# Patient Record
Sex: Female | Born: 1935
Health system: Southern US, Community
[De-identification: ages and names within clinical notes are randomized; demographics above are authoritative.]

## PROBLEM LIST (undated history)

## (undated) DIAGNOSIS — E079 Disorder of thyroid, unspecified: Secondary | ICD-10-CM

## (undated) DIAGNOSIS — Z8619 Personal history of other infectious and parasitic diseases: Secondary | ICD-10-CM

## (undated) DIAGNOSIS — M1990 Unspecified osteoarthritis, unspecified site: Secondary | ICD-10-CM

## (undated) DIAGNOSIS — I10 Essential (primary) hypertension: Secondary | ICD-10-CM

## (undated) DIAGNOSIS — E785 Hyperlipidemia, unspecified: Secondary | ICD-10-CM

## (undated) DIAGNOSIS — E119 Type 2 diabetes mellitus without complications: Secondary | ICD-10-CM

## (undated) HISTORY — PX: KNEE ARTHROSCOPY: SHX127

## (undated) HISTORY — DX: Hyperlipidemia, unspecified: E78.5

## (undated) HISTORY — DX: Unspecified osteoarthritis, unspecified site: M19.90

## (undated) HISTORY — DX: Essential (primary) hypertension: I10

## (undated) HISTORY — DX: Type 2 diabetes mellitus without complications: E11.9

## (undated) HISTORY — PX: REPLACEMENT TOTAL KNEE BILATERAL: SUR1225

## (undated) HISTORY — DX: Personal history of other infectious and parasitic diseases: Z86.19

## (undated) HISTORY — PX: ABDOMINAL HYSTERECTOMY: SHX81

## (undated) HISTORY — DX: Disorder of thyroid, unspecified: E07.9

---

## 2000-03-16 ENCOUNTER — Encounter: Payer: Self-pay | Admitting: Orthopedic Surgery

## 2000-03-20 ENCOUNTER — Inpatient Hospital Stay (HOSPITAL_COMMUNITY): Admission: RE | Admit: 2000-03-20 | Discharge: 2000-03-23 | Payer: Self-pay | Admitting: Orthopedic Surgery

## 2008-09-22 ENCOUNTER — Encounter: Admission: RE | Admit: 2008-09-22 | Discharge: 2008-11-04 | Payer: Self-pay | Admitting: Orthopedic Surgery

## 2010-01-03 LAB — HM MAMMOGRAPHY

## 2010-05-21 NOTE — Discharge Summary (Signed)
[facility]  Patient:    Jessica Meyer, Jessica Meyer [facility]                      MRN: [id number] Adm. Date:  [id number] Disc. Date: [id number] Attending:  Carolan Shiver Ii Dictator:   Arnoldo Morale, P.A.                           Discharge Summary  ADMISSION DIAGNOSES: 1. End-stage osteoarthritis, left knee. 2. History of osteoarthritis, right knee with right total knee replacement. 3. Type 2 diabetes mellitus. 4. Hypertension. 5. History of migraines.  DISCHARGE DIAGNOSES: 1. End-stage osteoarthritis, left knee. 2. History of osteoarthritis, right knee with right total knee replacement. 3. Type 2 diabetes mellitus. 4. Hypertension. 5. History of migraines. 6. Posthemorrhagic anemia.  PROCEDURE:  On March 20, 2000, Jessica Meyer underwent a left total knee arthroplasty by Dr. Jonny Ruiz L. Rendall, assisted by Arlyn Leak, P.A.-C.  No complications.  CONSULTATIONS: 1. Pharmacy consult for Coumadin therapy on March 20, 2000. 2. Physical therapy and rehabilitation medicine consult on March 21, 2000. 3. Occupational therapy consult on March 22, 2000.  HISTORY OF PRESENT ILLNESS:  This 75 year old, white female presented to Dr. Priscille Kluver with a three-year history of progressively worsening left knee pain. She underwent arthroscopy in September 2001, with minimal relief and the pain has gotten worse.  It is constant sharp and stabbing located over the anterior aspect of the knee without radiation.  It increases with walking and decreases with elevation.  The knee pops and locks and keeps her awake at night at times.  She has failed conservative measures and because of this, she is presenting for a left total knee arthroplasty.  HOSPITAL COURSE:  Jessica Meyer tolerated her surgical procedure well without immediate postoperative complications.  She was transferred to [address].  On postop day #1, she was afebrile with vital signs stable.  Hemoglobin was 11.5, hematocrit 33.7.  The  pain was well-controlled with PCA and she started on PT per protocol.  She had a normal postoperative course over the next several days.  She tolerated therapy well and was switched to p.o. pain medication.  T-max on March 20, was 99.4 and on March 21, was 101.4.  She did have some calf pain at that time, so a Doppler was obtained which was negative for DVT.  She was believed stable for discharge at that time and was discharged home later in the day.  SPECIAL INSTRUCTIONS:  She is to resume all prehospitalization medications and diet except for the Darvocet.  She is arranged for home health physical therapy, R.N. and pharmacy per [facility].  Home health nurse is to discontinue her staples and apply Steri-Strips with benzoin on April 03, 2000.  She is to monitor her temperature and notify Dr. Priscille Kluver if temperature greater than 101.5, chills, pain unrelieved by pain medications or foul smelling drainage from the wound.  DISCHARGE MEDICATIONS: 1. OxyContin 10 mg p.o. q.12h., #20 with no refill. 2. Percocet 5 mg one to two p.o. q.4h. p.r.n. pain, #40 with no refill. 3. Coumadin one tablet p.o. q.d. with the dose to be determined by pharmacy.  ACTIVITY:  She is to weightbearing as tolerated on the left leg with the use of a walker and is to keep the incision clean and dry.  FOLLOWUP:  Follow up with Dr. Priscille Kluver in our [facility] office on April 05, 2000, and  is to call [phone], for that appointment.  LABORATORY DATA AND X-RAY FINDINGS:  Lower extremity venous Doppler done on March 23, 2000, negative for DVT, SVT and Bakers cyst.  She was noted to have an S-shaped curve in the left popliteal artery.  On March 19, white count 17.1, hemoglobin 11.5, hematocrit 33.7.  On March 20, white count 11.6, hemoglobin 10.4, hematocrit 30.2.  On March 21, white count 11.7, hemoglobin 10.1, hematocrit 32 and platelets 236.  On March 14, PT 12.9 seconds, INR 1.1, PTT 32.  On March 21,  PT was 14.7, INR 1.3.  On March 14, glucose 298.  On March 20, glucose 213.  On March 21, glucose 161.  Her HIV was nonreactive.  Urinalysis on March 16, 2000, showed 500 mg/dl of glucose, but all other indices within normal limits.  Chest x-ray done on March 16, 2000, showed no active disease. DD:  04/18/00 TD:  04/18/00 Job: [id number]

## 2013-01-15 DIAGNOSIS — M47817 Spondylosis without myelopathy or radiculopathy, lumbosacral region: Secondary | ICD-10-CM | POA: Diagnosis not present

## 2013-01-15 DIAGNOSIS — M545 Low back pain, unspecified: Secondary | ICD-10-CM | POA: Diagnosis not present

## 2013-01-15 DIAGNOSIS — M949 Disorder of cartilage, unspecified: Secondary | ICD-10-CM | POA: Diagnosis not present

## 2013-01-15 DIAGNOSIS — M5137 Other intervertebral disc degeneration, lumbosacral region: Secondary | ICD-10-CM | POA: Diagnosis not present

## 2013-01-15 DIAGNOSIS — M899 Disorder of bone, unspecified: Secondary | ICD-10-CM | POA: Diagnosis not present

## 2013-01-15 DIAGNOSIS — E119 Type 2 diabetes mellitus without complications: Secondary | ICD-10-CM | POA: Diagnosis not present

## 2013-01-15 DIAGNOSIS — IMO0002 Reserved for concepts with insufficient information to code with codable children: Secondary | ICD-10-CM | POA: Diagnosis not present

## 2013-02-12 DIAGNOSIS — M545 Low back pain, unspecified: Secondary | ICD-10-CM | POA: Diagnosis not present

## 2013-02-12 DIAGNOSIS — M5137 Other intervertebral disc degeneration, lumbosacral region: Secondary | ICD-10-CM | POA: Diagnosis not present

## 2013-02-12 DIAGNOSIS — E119 Type 2 diabetes mellitus without complications: Secondary | ICD-10-CM | POA: Diagnosis not present

## 2013-02-12 DIAGNOSIS — M47817 Spondylosis without myelopathy or radiculopathy, lumbosacral region: Secondary | ICD-10-CM | POA: Diagnosis not present

## 2013-02-12 DIAGNOSIS — IMO0002 Reserved for concepts with insufficient information to code with codable children: Secondary | ICD-10-CM | POA: Diagnosis not present

## 2013-04-08 DIAGNOSIS — M545 Low back pain, unspecified: Secondary | ICD-10-CM | POA: Diagnosis not present

## 2013-04-08 DIAGNOSIS — E7800 Pure hypercholesterolemia, unspecified: Secondary | ICD-10-CM | POA: Diagnosis not present

## 2013-04-08 DIAGNOSIS — I10 Essential (primary) hypertension: Secondary | ICD-10-CM | POA: Diagnosis not present

## 2013-04-08 DIAGNOSIS — E119 Type 2 diabetes mellitus without complications: Secondary | ICD-10-CM | POA: Diagnosis not present

## 2013-04-08 DIAGNOSIS — H9190 Unspecified hearing loss, unspecified ear: Secondary | ICD-10-CM | POA: Diagnosis not present

## 2013-04-08 DIAGNOSIS — E039 Hypothyroidism, unspecified: Secondary | ICD-10-CM | POA: Diagnosis not present

## 2013-04-23 DIAGNOSIS — IMO0002 Reserved for concepts with insufficient information to code with codable children: Secondary | ICD-10-CM | POA: Diagnosis not present

## 2013-04-23 DIAGNOSIS — M545 Low back pain, unspecified: Secondary | ICD-10-CM | POA: Diagnosis not present

## 2013-04-23 DIAGNOSIS — M47817 Spondylosis without myelopathy or radiculopathy, lumbosacral region: Secondary | ICD-10-CM | POA: Diagnosis not present

## 2013-04-23 DIAGNOSIS — M5137 Other intervertebral disc degeneration, lumbosacral region: Secondary | ICD-10-CM | POA: Diagnosis not present

## 2013-08-07 DIAGNOSIS — N816 Rectocele: Secondary | ICD-10-CM | POA: Insufficient documentation

## 2013-08-07 DIAGNOSIS — H4920 Sixth [abducent] nerve palsy, unspecified eye: Secondary | ICD-10-CM | POA: Insufficient documentation

## 2013-08-07 DIAGNOSIS — E7800 Pure hypercholesterolemia, unspecified: Secondary | ICD-10-CM | POA: Insufficient documentation

## 2013-08-07 DIAGNOSIS — Z9660 Presence of unspecified orthopedic joint implant: Secondary | ICD-10-CM | POA: Insufficient documentation

## 2013-08-07 DIAGNOSIS — H903 Sensorineural hearing loss, bilateral: Secondary | ICD-10-CM | POA: Insufficient documentation

## 2013-08-07 DIAGNOSIS — M1710 Unilateral primary osteoarthritis, unspecified knee: Secondary | ICD-10-CM | POA: Insufficient documentation

## 2013-08-07 DIAGNOSIS — I10 Essential (primary) hypertension: Secondary | ICD-10-CM | POA: Insufficient documentation

## 2013-08-07 DIAGNOSIS — M8580 Other specified disorders of bone density and structure, unspecified site: Secondary | ICD-10-CM | POA: Insufficient documentation

## 2013-08-07 DIAGNOSIS — M47816 Spondylosis without myelopathy or radiculopathy, lumbar region: Secondary | ICD-10-CM | POA: Insufficient documentation

## 2013-08-07 DIAGNOSIS — E039 Hypothyroidism, unspecified: Secondary | ICD-10-CM | POA: Insufficient documentation

## 2013-08-07 DIAGNOSIS — M179 Osteoarthritis of knee, unspecified: Secondary | ICD-10-CM | POA: Insufficient documentation

## 2013-08-07 DIAGNOSIS — H905 Unspecified sensorineural hearing loss: Secondary | ICD-10-CM | POA: Insufficient documentation

## 2013-08-07 DIAGNOSIS — F418 Other specified anxiety disorders: Secondary | ICD-10-CM | POA: Insufficient documentation

## 2013-10-30 DIAGNOSIS — H2513 Age-related nuclear cataract, bilateral: Secondary | ICD-10-CM | POA: Diagnosis not present

## 2013-10-30 DIAGNOSIS — E119 Type 2 diabetes mellitus without complications: Secondary | ICD-10-CM | POA: Diagnosis not present

## 2013-11-21 DIAGNOSIS — I10 Essential (primary) hypertension: Secondary | ICD-10-CM | POA: Diagnosis not present

## 2013-11-21 DIAGNOSIS — Z23 Encounter for immunization: Secondary | ICD-10-CM | POA: Diagnosis not present

## 2013-11-21 DIAGNOSIS — E039 Hypothyroidism, unspecified: Secondary | ICD-10-CM | POA: Diagnosis not present

## 2013-11-21 DIAGNOSIS — E119 Type 2 diabetes mellitus without complications: Secondary | ICD-10-CM | POA: Diagnosis not present

## 2013-11-21 DIAGNOSIS — Z794 Long term (current) use of insulin: Secondary | ICD-10-CM | POA: Diagnosis not present

## 2013-11-21 DIAGNOSIS — E785 Hyperlipidemia, unspecified: Secondary | ICD-10-CM | POA: Diagnosis not present

## 2014-04-08 ENCOUNTER — Encounter: Payer: Self-pay | Admitting: Family Medicine

## 2014-04-08 ENCOUNTER — Encounter (INDEPENDENT_AMBULATORY_CARE_PROVIDER_SITE_OTHER): Payer: Self-pay

## 2014-04-08 ENCOUNTER — Ambulatory Visit (HOSPITAL_BASED_OUTPATIENT_CLINIC_OR_DEPARTMENT_OTHER)
Admission: RE | Admit: 2014-04-08 | Discharge: 2014-04-08 | Disposition: A | Discharge status: Home / Self Care | Payer: Medicare Other | Source: Ambulatory Visit | Attending: Family Medicine | Admitting: Family Medicine

## 2014-04-08 ENCOUNTER — Ambulatory Visit (INDEPENDENT_AMBULATORY_CARE_PROVIDER_SITE_OTHER): Payer: Medicare Other | Admitting: Family Medicine

## 2014-04-08 ENCOUNTER — Other Ambulatory Visit: Payer: Self-pay | Admitting: Family Medicine

## 2014-04-08 VITALS — BP 151/83 | HR 66 | Ht 65.0 in | Wt 145.0 lb

## 2014-04-08 DIAGNOSIS — M25561 Pain in right knee: Secondary | ICD-10-CM

## 2014-04-08 DIAGNOSIS — Z96651 Presence of right artificial knee joint: Secondary | ICD-10-CM | POA: Diagnosis not present

## 2014-04-08 DIAGNOSIS — Z471 Aftercare following joint replacement surgery: Secondary | ICD-10-CM | POA: Diagnosis not present

## 2014-04-08 DIAGNOSIS — M25461 Effusion, right knee: Secondary | ICD-10-CM | POA: Insufficient documentation

## 2014-04-08 NOTE — Patient Instructions (Signed)
Call me in 1-2 weeks to let me know how you're doing. These are the 4 different types of medicines you can take: Tylenol $RemoveBefor [id number]$500mgbs twice a day with food Glucosamine sulfate 750mg  twice a day is a supplement that may help. Capsaicin topically up to four times a day may also help with pain. Cortisone injections are an option - you were given this today. It's important that you continue to stay active. Straight leg raises, knee extensions 3 sets of 10 once a day (add ankle weight if these become too easy). Consider physical therapy to strengthen muscles around the joint that hurts to take pressure off of the joint itself. Shoe inserts with good arch support may be helpful. Walker or cane if needed. Heat or ice 15 minutes at a time 3-4 times a day as needed to help with pain.

## 2014-04-09 LAB — SYNOVIAL CELL COUNT + DIFF, W/ CRYSTALS
Eosinophils-Synovial: 1 % (ref 0–1)
Lymphocytes-Synovial Fld: 15 % (ref 0–20)
Monocyte/Macrophage: 11 % — ABNORMAL LOW (ref 50–90)
Neutrophil, Synovial: 73 % — ABNORMAL HIGH (ref 0–25)
WBC, Synovial: 2715 cu mm — ABNORMAL HIGH (ref 0–200)

## 2014-04-10 ENCOUNTER — Ambulatory Visit (INDEPENDENT_AMBULATORY_CARE_PROVIDER_SITE_OTHER): Payer: Medicare Other | Admitting: Family Medicine

## 2014-04-10 ENCOUNTER — Encounter: Payer: Self-pay | Admitting: Family Medicine

## 2014-04-10 VITALS — BP 151/79 | HR 69 | Ht 66.0 in | Wt 145.0 lb

## 2014-04-10 DIAGNOSIS — M25561 Pain in right knee: Secondary | ICD-10-CM | POA: Diagnosis present

## 2014-04-10 MED ORDER — METHYLPREDNISOLONE ACETATE 40 MG/ML IJ SUSP
40.0000 mg | Freq: Once | INTRAMUSCULAR | Status: AC
  Administered 2014-04-10: 40 mg via INTRA_ARTICULAR

## 2014-04-11 DIAGNOSIS — M25561 Pain in right knee: Secondary | ICD-10-CM | POA: Insufficient documentation

## 2014-04-11 NOTE — Assessment & Plan Note (Signed)
with pain to 10/10 at times.  Radiographs show excellent seating of total knee components.  Advised we go ahead with aspiration and injection today for possible gout - when aspirating fluid however it appeared bloody so I recommended waiting on the injection until evaluation of the synovial fluid.  After informed written consent patient was lying supine on exam table.  Right knee was prepped with alcohol swab.  Utilizing superolateral approach, 3 mL of marcaine was used for local anesthesia.  Then using an 18g needle on 60cc syringe, 30 mL of sanguinous colored fluid was aspirated from right knee.  Fluid will be sent to lab for analysis.  Patient tolerated procedure well without immediate complications

## 2014-04-11 NOTE — Progress Notes (Signed)
PCP: No primary care provider on file.  Subjective:   HPI: Patient is a 79 y.o. female here for right knee pain, swelling.  Patient is s/p bilateral total knee replacements. Right replacement was about 17 years ago. States over past week has had increased swelling, warmth, pain in the right knee. No known injuries. No fevers, sweats, chills. No redness that she has seen. No history of gout, inflammatory arthritis. Tried aleve, motrin, icing and heat. Was pulling weeds a lot in couple days prior to this.  No past medical history on file.  No current outpatient prescriptions on file prior to visit.   No current facility-administered medications on file prior to visit.    No past surgical history on file.  No Known Allergies  History   Social History  . Marital Status: Single    Spouse Name: N/A  . Number of Children: N/A  . Years of Education: N/A   Occupational History  . Not on file.   Social History Main Topics  . Smoking status: Never Smoker   . Smokeless tobacco: Not on file  . Alcohol Use: Not on file  . Drug Use: Not on file  . Sexual Activity: Not on file   Other Topics Concern  . Not on file   Social History Narrative    No family history on file.  BP 151/83 mmHg  Pulse 66  Ht 5\' 5"  (1.651 m)  Wt 145 lb (65.772 kg)  BMI 24.13 kg/m2  Review of Systems: See HPI above.    Objective:  Physical Exam:  Gen: NAD  Right knee: Well healed total knee incision.  Mod effusion confirmed by ultrasound.  No other deformity.  No erythema. TTP Mildly diffuse anterior knee. ROM 0 - 100 degrees. Negative valgus/varus testing. NV intact distally.    Assessment & Plan:  1. Right knee effusion - with associated pain to 10/10 at times.  Radiographs show excellent seating of total knee components.  Advised we go ahead with aspiration and injection today for possible gout - when aspirating fluid however it appeared bloody so I recommended waiting on the  injection until evaluation of the synovial fluid.  After informed written consent patient was lying supine on exam table.  Right knee was prepped with alcohol swab.  Utilizing superolateral approach, 3 mL of marcaine was used for local anesthesia.  Then using an 18g needle on 60cc syringe, 30 mL of sanguinous colored fluid was aspirated from right knee.  Fluid will be sent to lab for analysis.  Patient tolerated procedure well without immediate complications  Addendum:  Synovial fluid analysis confirmed gout crystals, no growth of fluid.  No other concerning abnormalities.  She was brought back on 4/7 as pain was only slightly better and we performed intraarticular injection with ultrasound guidance - similar approach to her aspiration from 4/5.  She tolerated well without complications.

## 2014-04-11 NOTE — Progress Notes (Signed)
See addendum from 4/5 note for details on today's visit.

## 2014-04-11 NOTE — Assessment & Plan Note (Signed)
See addendum from patient's note on 4/5 for details - injection given on 4/7 after synovial fluid analysis confirmed crystal formation in joint.

## 2014-04-12 LAB — BODY FLUID CULTURE
Gram Stain: NONE SEEN
Organism ID, Bacteria: NO GROWTH

## 2014-07-28 ENCOUNTER — Telehealth: Payer: Self-pay | Admitting: Behavioral Health

## 2014-07-28 ENCOUNTER — Encounter: Payer: Self-pay | Admitting: Behavioral Health

## 2014-07-28 NOTE — Telephone Encounter (Signed)
Pre-Visit Call completed with patient and chart updated.   Pre-Visit Info documented in Specialty Comments under [employer].    

## 2014-07-29 ENCOUNTER — Ambulatory Visit (INDEPENDENT_AMBULATORY_CARE_PROVIDER_SITE_OTHER): Payer: Medicare Other | Admitting: Family

## 2014-07-29 ENCOUNTER — Encounter: Payer: Self-pay | Admitting: Family

## 2014-07-29 VITALS — BP 126/78 | HR 69 | Temp 97.8°F | Resp 16 | Ht 65.0 in | Wt 146.4 lb

## 2014-07-29 DIAGNOSIS — E785 Hyperlipidemia, unspecified: Secondary | ICD-10-CM | POA: Diagnosis not present

## 2014-07-29 DIAGNOSIS — R2681 Unsteadiness on feet: Secondary | ICD-10-CM

## 2014-07-29 DIAGNOSIS — E039 Hypothyroidism, unspecified: Secondary | ICD-10-CM

## 2014-07-29 DIAGNOSIS — H4920 Sixth [abducent] nerve palsy, unspecified eye: Secondary | ICD-10-CM

## 2014-07-29 DIAGNOSIS — F341 Dysthymic disorder: Secondary | ICD-10-CM

## 2014-07-29 DIAGNOSIS — R350 Frequency of micturition: Secondary | ICD-10-CM

## 2014-07-29 DIAGNOSIS — I10 Essential (primary) hypertension: Secondary | ICD-10-CM

## 2014-07-29 DIAGNOSIS — E119 Type 2 diabetes mellitus without complications: Secondary | ICD-10-CM | POA: Diagnosis not present

## 2014-07-29 LAB — LIPID PANEL
Cholesterol: 154 mg/dL (ref 0–200)
HDL: 44.3 mg/dL (ref >39.00)
LDL Cholesterol: 86 mg/dL (ref 0–99)
NonHDL: 109.7
Total CHOL/HDL Ratio: 3
Triglycerides: 121 mg/dL (ref 0.0–149.0)
VLDL: 24.2 mg/dL (ref 0.0–40.0)

## 2014-07-29 LAB — HEPATIC FUNCTION PANEL
ALT: 12 U/L (ref 0–35)
AST: 17 U/L (ref 0–37)
Albumin: 4.5 g/dL (ref 3.5–5.2)
Alkaline Phosphatase: 81 U/L (ref 39–117)
BILIRUBIN DIRECT: 0.1 mg/dL (ref 0.0–0.3)
TOTAL PROTEIN: 7.5 g/dL (ref 6.0–8.3)
Total Bilirubin: 0.5 mg/dL (ref 0.2–1.2)

## 2014-07-29 LAB — URINALYSIS, ROUTINE W REFLEX MICROSCOPIC
Bilirubin Urine: NEGATIVE
HGB URINE DIPSTICK: NEGATIVE
Ketones, ur: NEGATIVE
LEUKOCYTES UA: NEGATIVE
NITRITE: NEGATIVE
PH: 7 (ref 5.0–8.0)
Specific Gravity, Urine: 1.015 (ref 1.000–1.030)
Total Protein, Urine: NEGATIVE
UROBILINOGEN UA: 0.2 (ref 0.0–1.0)
Urine Glucose: 500 — AB

## 2014-07-29 LAB — HEMOGLOBIN A1C: Hgb A1c MFr Bld: 7.6 % — ABNORMAL HIGH (ref 4.6–6.5)

## 2014-07-29 LAB — MICROALBUMIN / CREATININE URINE RATIO
Creatinine,U: 66 mg/dL
Microalb Creat Ratio: 1.1 mg/g (ref 0.0–30.0)
Microalb, Ur: <0.7 mg/dL (ref 0.0–1.9)

## 2014-07-29 LAB — TSH: TSH: 4.05 u[IU]/mL (ref 0.35–4.50)

## 2014-07-29 MED ORDER — AMLODIPINE BESYLATE 5 MG PO TABS: 5.0000 mg | ORAL_TABLET | Freq: Every day | ORAL | Status: DC

## 2014-07-29 MED ORDER — BUPROPION HCL 100 MG PO TABS: 100.0000 mg | ORAL_TABLET | Freq: Two times a day (BID) | ORAL | Status: DC

## 2014-07-29 NOTE — Assessment & Plan Note (Signed)
Maintained on lisinopril, carvedilol amlodipine. Well controlled, continue same.

## 2014-07-29 NOTE — Progress Notes (Signed)
Subjective:    Patient ID: Jessica Meyer, female    DOB: 1935/05/02, 79 y.o.   MRN: [id number]  HPI  Jessica Meyer is a 80 yr old female who presents today to establish care. She was previously followed at [facility].    Pmhx is significant for the following:    DM2- on lantus 14 units.  Reports that she was diagnosed at age 86.  Has been on insulin x 3 years.  Checks her sugar twice a daily. Reports sugar 120-130 before breakfast. As high as 150-160 post prandia.  Not on aspirin due to bruising. Declines aspirin.  Depression- on wellbutrin.  Reports that  Symptoms started 7 years ago.   Hypothyroidism- on synthroid, reports that she was diagnosed 3 years ago.  Reports TSH has been stable.    Hyperlipidemia-  On atorvastatin. Denies myalgia.    HTN-  Currently maintained on lisinopril, carvedilol, amlodipine.    BP Readings from Last 3 Encounters:  07/29/14 126/78  04/10/14 151/79  04/08/14 151/83   Gait instability- daughter reports + gait instability and is concerned about increased fall risk. Patient does not drive and cannot attend outpatient PT.     Review of Systems  Constitutional: Negative for unexpected weight change.  HENT: Positive for hearing loss. Negative for rhinorrhea.        Wears  Hearing aids  Eyes: Negative for visual disturbance.  Respiratory: Negative for cough.   Cardiovascular: Negative for leg swelling.  Gastrointestinal: Negative for nausea, diarrhea and constipation.  Genitourinary: Positive for frequency. Negative for dysuria.  Musculoskeletal: Positive for arthralgias.       Some knee pain- follows with Dr. Pearletha Forge  Skin: Negative for rash.  Neurological: Negative for headaches.  Hematological: Negative for adenopathy.  Psychiatric/Behavioral: Negative for dysphoric mood and agitation.   Past Medical History  Diagnosis Date  . Diabetes mellitus without complication   . Hypertension   . Hyperlipidemia   . Arthritis   . History of chicken  pox   . Thyroid disease     hypothyroid    History   Social History  . Marital Status: Single    Spouse Name: N/A  . Number of Children: N/A  . Years of Education: N/A   Occupational History  . Not on file.   Social History Main Topics  . Smoking status: Never Smoker   . Smokeless tobacco: Not on file  . Alcohol Use: 0.6 oz/week    1 Standard drinks or equivalent per week     Comment: occasional beer  . Drug Use: No  . Sexual Activity: Not on file   Other Topics Concern  . Not on file   Social History Narrative   Retired homemaker   Widow   Completed 7th grade   2 daughter   Jessica Meyer and daughter. Daughter local, son in [place]   4 grandchildren   Enjoys puzzles, gardening.       Past Surgical History  Procedure Laterality Date  . Knee arthroscopy Bilateral   . Abdominal hysterectomy    . Replacement total knee bilateral      1998 left , 1992 right    Family History  Problem Relation Age of Onset  . Arthritis Mother   . Depression Mother   . Heart attack Father     25   . Hypertension Father     No Known Allergies  Current Outpatient Prescriptions on File Prior to Visit  Medication Sig Dispense Refill  . atorvastatin (  LIPITOR) 10 MG tablet Take 10 mg by mouth at bedtime.     . B-D UF III MINI PEN NEEDLES 31G X 5 MM MISC     . carvedilol (COREG) 12.5 MG tablet Take 12.5 mg by mouth 2 (two) times daily with a meal.     . GLIPIZIDE XL 10 MG 24 hr tablet Take by mouth daily with breakfast.     . LANTUS SOLOSTAR 100 UNIT/ML Solostar Pen Inject 14 Units into the skin daily before lunch.     . levothyroxine (SYNTHROID, LEVOTHROID) 50 MCG tablet Take 50 mcg by mouth daily.     Marland Kitchen lisinopril (PRINIVIL,ZESTRIL) 40 MG tablet Take 40 mg by mouth daily.      No current facility-administered medications on file prior to visit.    BP 126/78 mmHg  Pulse 69  Temp(Src) 97.8 F (36.6 C) (Oral)  Resp 16  Ht 5\' 5"  (1.651 m)  Wt 146 lb 6.4 oz (66.407 kg)  BMI 24.36 kg/m2   SpO2 96%       Objective:   Physical Exam  Constitutional: She is oriented to person, place, and time. She appears well-developed and well-nourished.  HENT:  Head: Normocephalic and atraumatic.  Eyes: Pupils are equal, round, and reactive to light. No scleral icterus.  Neck: No thyromegaly present.  Cardiovascular: Normal rate, regular rhythm and normal heart sounds.   No murmur heard. Pulmonary/Chest: Effort normal and breath sounds normal. No respiratory distress. She has no wheezes.  Abdominal: Soft. She exhibits no distension. There is no tenderness. There is no rebound.  Musculoskeletal: She exhibits no edema.  Lymphadenopathy:    She has no cervical adenopathy.  Neurological: She is alert and oriented to person, place, and time.  Skin: Skin is warm and dry.  Psychiatric: She has a normal mood and affect. Her behavior is normal. Judgment and thought content normal.          Assessment & Plan:

## 2014-07-29 NOTE — Assessment & Plan Note (Signed)
Depression is stable.  Continue wellbutrin. Uses hydroxyzine prn anxiety.

## 2014-07-29 NOTE — Assessment & Plan Note (Signed)
We discussed addition of ASA for cardiac prevention but she declines due to hx of bruising.  Obtain A1C, urine microalbumin.

## 2014-07-29 NOTE — Assessment & Plan Note (Signed)
Obtain lipid panel, baseline LFT.   Continue statin.

## 2014-07-29 NOTE — Assessment & Plan Note (Signed)
Will request records from her previous provider.

## 2014-07-29 NOTE — Assessment & Plan Note (Signed)
Refer for [facility] for gait traing.

## 2014-07-29 NOTE — Patient Instructions (Signed)
Please complete lab work prior to leaving. We will work on arranging home physical therapy to help you with your balance. Please schedule medicare wellness visit at the front desk. Welcome to [facility]!

## 2014-07-29 NOTE — Assessment & Plan Note (Signed)
Clinically stable, obtain TSH.  

## 2014-07-29 NOTE — Progress Notes (Signed)
Pre visit review using our clinic review tool, if applicable. No additional management support is needed unless otherwise documented below in the visit note. 

## 2014-07-30 ENCOUNTER — Telehealth: Payer: Self-pay | Admitting: Family

## 2014-07-30 NOTE — Telephone Encounter (Signed)
Please contact patient and let her know that I reviewed her lab work. Based on her age her A1C is acceptable.  Please continue current dose of lantus. Cholesterol is at goal and liver function is normal.  Thyroid function looks good.  Continue current dose of synthroid.

## 2014-07-31 LAB — URINE CULTURE
Colony Count: NO GROWTH
Organism ID, Bacteria: NO GROWTH

## 2014-07-31 NOTE — Telephone Encounter (Signed)
Pt notified and made aware. She agrees with plan.  No concerns were voiced at this time.

## 2014-08-01 ENCOUNTER — Telehealth: Payer: Self-pay | Admitting: Family

## 2014-08-01 DIAGNOSIS — R2681 Unsteadiness on feet: Secondary | ICD-10-CM | POA: Diagnosis not present

## 2014-08-01 DIAGNOSIS — M47896 Other spondylosis, lumbar region: Secondary | ICD-10-CM | POA: Diagnosis not present

## 2014-08-01 NOTE — Telephone Encounter (Signed)
Caller name: Ulice Dash Relationship to patient: Jessica Meyer Can be reached:[phone]  Pharmacy:  Reason for call: saw pt today, would like a verbal order for physical therapy twice a week for three weeks.

## 2014-08-01 NOTE — Telephone Encounter (Signed)
Left detailed message on below voicemail that it is ok to proceed as below.

## 2014-08-05 DIAGNOSIS — M47896 Other spondylosis, lumbar region: Secondary | ICD-10-CM | POA: Diagnosis not present

## 2014-08-05 DIAGNOSIS — R2681 Unsteadiness on feet: Secondary | ICD-10-CM | POA: Diagnosis not present

## 2014-08-08 DIAGNOSIS — R2681 Unsteadiness on feet: Secondary | ICD-10-CM | POA: Diagnosis not present

## 2014-08-08 DIAGNOSIS — M47896 Other spondylosis, lumbar region: Secondary | ICD-10-CM | POA: Diagnosis not present

## 2014-08-11 ENCOUNTER — Telehealth: Payer: Self-pay | Admitting: Family

## 2014-08-11 NOTE — Telephone Encounter (Signed)
Is there another option?  That diagnosis states that her knee replacement components are loose which is not the case.  She had confirmed gout.  Lower extremity weakness? Is there a code for knee instability in general?

## 2014-08-11 NOTE — Telephone Encounter (Signed)
Caller name:[place] Relation to pt: Call back [phone] Pharmacy:  Reason for call: pt is needing order to add dx of instability of right knee joint prostaesis. (for coding purposes only)

## 2014-08-11 NOTE — Telephone Encounter (Signed)
Please advise home health- can use diagnosis of gout or LE weakness.  Joint replacement is not unstable.

## 2014-08-11 NOTE — Telephone Encounter (Signed)
Notified Debroah Loop and he voices understanding.

## 2014-08-11 NOTE — Telephone Encounter (Signed)
Dr. Pearletha Forge,  Is this diagnosis appropriate from your standpoint- home health is requesting.  thanks

## 2014-08-12 ENCOUNTER — Telehealth: Payer: Self-pay | Admitting: *Deleted

## 2014-08-12 DIAGNOSIS — M47896 Other spondylosis, lumbar region: Secondary | ICD-10-CM | POA: Diagnosis not present

## 2014-08-12 DIAGNOSIS — R2681 Unsteadiness on feet: Secondary | ICD-10-CM | POA: Diagnosis not present

## 2014-08-12 NOTE — Telephone Encounter (Signed)
Medical records received via mail from Dr. Hampton Abbot. Forwarded to [employer]. JG//CMA

## 2014-08-15 DIAGNOSIS — R2681 Unsteadiness on feet: Secondary | ICD-10-CM | POA: Diagnosis not present

## 2014-08-15 DIAGNOSIS — M47896 Other spondylosis, lumbar region: Secondary | ICD-10-CM | POA: Diagnosis not present

## 2014-08-19 DIAGNOSIS — M47896 Other spondylosis, lumbar region: Secondary | ICD-10-CM | POA: Diagnosis not present

## 2014-08-19 DIAGNOSIS — R2681 Unsteadiness on feet: Secondary | ICD-10-CM | POA: Diagnosis not present

## 2014-08-22 DIAGNOSIS — M47896 Other spondylosis, lumbar region: Secondary | ICD-10-CM | POA: Diagnosis not present

## 2014-08-22 DIAGNOSIS — R2681 Unsteadiness on feet: Secondary | ICD-10-CM | POA: Diagnosis not present

## 2014-09-15 ENCOUNTER — Ambulatory Visit (HOSPITAL_BASED_OUTPATIENT_CLINIC_OR_DEPARTMENT_OTHER)
Admission: RE | Admit: 2014-09-15 | Discharge: 2014-09-15 | Disposition: A | Discharge status: Home / Self Care | Payer: Medicare Other | Source: Ambulatory Visit | Attending: Family Medicine | Admitting: Family Medicine

## 2014-09-15 ENCOUNTER — Encounter: Payer: Self-pay | Admitting: Family Medicine

## 2014-09-15 ENCOUNTER — Ambulatory Visit (INDEPENDENT_AMBULATORY_CARE_PROVIDER_SITE_OTHER): Payer: Medicare Other | Admitting: Family Medicine

## 2014-09-15 VITALS — BP 150/77 | HR 62 | Ht 66.0 in | Wt 145.0 lb

## 2014-09-15 DIAGNOSIS — M25561 Pain in right knee: Secondary | ICD-10-CM | POA: Diagnosis not present

## 2014-09-15 DIAGNOSIS — M25461 Effusion, right knee: Secondary | ICD-10-CM | POA: Insufficient documentation

## 2014-09-15 NOTE — Patient Instructions (Signed)
Your knee pain is more due to overuse, strain of peroneal muscles. I would decrease your exercises to just once a day. Icing, elevate above your heart as much as possible (icing only 15 minutes at a time). Motrin as needed for pain. Compression sleeve or ace wrap to help with swelling. Call me in a couple weeks if you're still not improving or if the pain is localizing more around the whole knee than below the knee on the outside.

## 2014-09-17 NOTE — Progress Notes (Signed)
PCP: Lemont Fillers., NP  Subjective:   HPI: Patient is a 79 y.o. female here for right knee pain, swelling.  Patient has had worsening lateral right knee pain for past several weeks. No known injury but doing more rehab, exercises. Does not worsen with walking. Seems to wake her up in the middle of the night. Pain is lateral but below the knee joint. Pain level 5/10. Some associated swelling. No fever, redness.  Past Medical History  Diagnosis Date  . Diabetes mellitus without complication   . Hypertension   . Hyperlipidemia   . Arthritis   . History of chicken pox   . Thyroid disease     hypothyroid    Current Outpatient Prescriptions on File Prior to Visit  Medication Sig Dispense Refill  . amLODipine (NORVASC) 5 MG tablet Take 1 tablet (5 mg total) by mouth daily. 90 tablet 1  . atorvastatin (LIPITOR) 10 MG tablet Take 10 mg by mouth at bedtime.     . B-D UF III MINI PEN NEEDLES 31G X 5 MM MISC     . buPROPion (WELLBUTRIN) 100 MG tablet Take 1 tablet (100 mg total) by mouth 2 (two) times daily. 90 tablet 1  . carvedilol (COREG) 12.5 MG tablet Take 12.5 mg by mouth 2 (two) times daily with a meal.     . GLIPIZIDE XL 10 MG 24 hr tablet Take by mouth daily with breakfast.     . hydrOXYzine (ATARAX/VISTARIL) 25 MG tablet Take 25 mg by mouth 2 (two) times daily as needed for anxiety.    Marland Kitchen LANTUS SOLOSTAR 100 UNIT/ML Solostar Pen Inject 14 Units into the skin daily before lunch.     . levothyroxine (SYNTHROID, LEVOTHROID) 50 MCG tablet Take 50 mcg by mouth daily.     Marland Kitchen lisinopril (PRINIVIL,ZESTRIL) 40 MG tablet Take 40 mg by mouth daily.      No current facility-administered medications on file prior to visit.    Past Surgical History  Procedure Laterality Date  . Knee arthroscopy Bilateral   . Abdominal hysterectomy    . Replacement total knee bilateral      1998 left , 1992 right    No Known Allergies  Social History   Social History  . Marital Status:  Single    Spouse Name: N/A  . Number of Children: N/A  . Years of Education: N/A   Occupational History  . Not on file.   Social History Main Topics  . Smoking status: Never Smoker   . Smokeless tobacco: Not on file  . Alcohol Use: 0.6 oz/week    1 Standard drinks or equivalent per week     Comment: occasional beer  . Drug Use: No  . Sexual Activity: Not on file   Other Topics Concern  . Not on file   Social History Narrative   Retired homemaker   Widow   Completed 7th grade   2 daughter   Shari Heritage and daughter. Daughter local, son in [place]   4 grandchildren   Enjoys puzzles, gardening.       Family History  Problem Relation Age of Onset  . Arthritis Mother   . Depression Mother   . Heart attack Father     107   . Hypertension Father     BP 150/77 mmHg  Pulse 62  Ht  (1.676 m)  Wt 145 lb (65.772 kg)  BMI 23.41 kg/m2  Review of Systems: See HPI above.    Objective:  Physical  Exam:  Gen: NAD  Right knee: No gross deformity, ecchymoses.  Mild effusion. TTP lateral proximal lower leg over proximal peroneal musculature.  No joint line, suprapatellar tenderness. FROM. Negative valgus/varus testing. Negative patellar apprehension. NV intact distally.    Assessment & Plan:  1. Right knee pain - patient has a mild effusion currently but presentation different from prior acute gout flares - pain more localized to below knee laterally within peroneal musculature.  Suspect this is related to overuse from increased rehab.  She does these exercises 3 times a day - encouraged to decrease to once a day.  Icing, elevation, motrin if needed.  ACE wrap or compression sleeve.  If not improving would consider an injection intraarticularly following repeat evaluation.

## 2014-09-17 NOTE — Assessment & Plan Note (Signed)
patient has a mild effusion currently but presentation different from prior acute gout flares - pain more localized to below knee laterally within peroneal musculature.  Suspect this is related to overuse from increased rehab.  She does these exercises 3 times a day - encouraged to decrease to once a day.  Icing, elevation, motrin if needed.  ACE wrap or compression sleeve.  If not improving would consider an injection intraarticularly following repeat evaluation.

## 2014-10-02 ENCOUNTER — Other Ambulatory Visit: Payer: Self-pay | Admitting: Family

## 2014-10-13 ENCOUNTER — Ambulatory Visit (INDEPENDENT_AMBULATORY_CARE_PROVIDER_SITE_OTHER): Payer: Medicare Other | Admitting: Family

## 2014-10-13 ENCOUNTER — Encounter: Payer: Self-pay | Admitting: Family

## 2014-10-13 VITALS — BP 145/72 | HR 62 | Temp 98.4°F | Resp 20 | Ht 66.0 in | Wt 147.4 lb

## 2014-10-13 DIAGNOSIS — M8580 Other specified disorders of bone density and structure, unspecified site: Secondary | ICD-10-CM

## 2014-10-13 DIAGNOSIS — Z23 Encounter for immunization: Secondary | ICD-10-CM | POA: Diagnosis not present

## 2014-10-13 DIAGNOSIS — E2839 Other primary ovarian failure: Secondary | ICD-10-CM

## 2014-10-13 DIAGNOSIS — Z0000 Encounter for general adult medical examination without abnormal findings: Secondary | ICD-10-CM | POA: Diagnosis not present

## 2014-10-13 NOTE — Patient Instructions (Signed)
Please follow up in 2-3 months. You will be contacted about scheduling your bone density. You will receive the stool kit (cologuard) in the mail.  Please complete and return at your earliest convenience.

## 2014-10-13 NOTE — Progress Notes (Signed)
Subjective:    Jessica Meyer is a 79 y.o. female who presents for Medicare Annual/Subsequent preventive examination.  Preventive Screening-Counseling & Management  Tobacco History  Smoking status  . Never Smoker   Smokeless tobacco  . Current User  . Types: Snuff     Problems Prior to Visit 1. Hyperlipidemia- denies myalgia. Lab Results  Component Value Date   CHOL 154 07/29/2014   HDL 44.30 07/29/2014   LDLCALC 86 07/29/2014   TRIG 121.0 07/29/2014   CHOLHDL 3 07/29/2014    2.  Depression-  Reports that her mood is good.   3.  HTN- maintained on carvedilol, lisinopril.   BP Readings from Last 3 Encounters:  10/13/14 145/72  09/15/14 150/77  07/29/14 126/78   Patient presents today for complete physical.  Immunizations: due for flu shot Diet: reports healthy diet Exercise: enjoys gardening/yardwork Colonoscopy:? Declines agreeable to cologuard.  Dexa: 4 yrs Pap Smear: hysterectomy Mammogram: declines further mammograms.      Current Problems (verified) Patient Active Problem List   Diagnosis Date Noted  . HTN (hypertension) 07/29/2014  . Gait instability 07/29/2014  . Hyperlipidemia 07/29/2014  . Right knee pain 04/11/2014  . ASNHL (asymmetrical sensorineural hearing loss) 08/07/2013  . Diabetes type 2, controlled (HCC) 08/07/2013  . 6Th nerve palsy 08/07/2013  . Hernia, rectovaginal 08/07/2013  . Hypercholesterolemia without hypertriglyceridemia 08/07/2013  . Arthritis of knee, degenerative 08/07/2013  . Degenerative arthritis of lumbar spine 08/07/2013  . Osteopenia 08/07/2013  . Adult hypothyroidism 08/07/2013  . History of artificial joint 08/07/2013  . Essential (primary) hypertension 08/07/2013  . Depression, neurotic 08/07/2013    Medications Prior to Visit Current Outpatient Prescriptions on File Prior to Visit  Medication Sig Dispense Refill  . amLODipine (NORVASC) 5 MG tablet Take 1 tablet (5 mg total) by mouth daily. 90 tablet 1  .  atorvastatin (LIPITOR) 10 MG tablet Take 10 mg by mouth at bedtime.     . B-D UF III MINI PEN NEEDLES 31G X 5 MM MISC     . buPROPion (WELLBUTRIN) 100 MG tablet TAKE 1 TABLET  BY MOUTH 2 TIMES DAILY. 60 tablet 5  . carvedilol (COREG) 12.5 MG tablet Take 12.5 mg by mouth 2 (two) times daily with a meal.     . GLIPIZIDE XL 10 MG 24 hr tablet Take by mouth daily with breakfast.     . hydrOXYzine (ATARAX/VISTARIL) 25 MG tablet Take 25 mg by mouth 2 (two) times daily as needed for anxiety.    [facility] LANTUS SOLOSTAR 100 UNIT/ML Solostar Pen Inject 14 Units into the skin daily before lunch.     . levothyroxine (SYNTHROID, LEVOTHROID) 50 MCG tablet Take 50 mcg by mouth daily.     [facility] lisinopril (PRINIVIL,ZESTRIL) 40 MG tablet Take 40 mg by mouth daily.      No current facility-administered medications on file prior to visit.    Current Medications (verified) Current Outpatient Prescriptions  Medication Sig Dispense Refill  . amLODipine (NORVASC) 5 MG tablet Take 1 tablet (5 mg total) by mouth daily. 90 tablet 1  . atorvastatin (LIPITOR) 10 MG tablet Take 10 mg by mouth at bedtime.     . B-D UF III MINI PEN NEEDLES 31G X 5 MM MISC     . buPROPion (WELLBUTRIN) 100 MG tablet TAKE 1 TABLET  BY MOUTH 2 TIMES DAILY. 60 tablet 5  . carvedilol (COREG) 12.5 MG tablet Take 12.5 mg by mouth 2 (two) times daily with a meal.     .  GLIPIZIDE XL 10 MG 24 hr tablet Take by mouth daily with breakfast.     . hydrOXYzine (ATARAX/VISTARIL) 25 MG tablet Take 25 mg by mouth 2 (two) times daily as needed for anxiety.    [facility] LANTUS SOLOSTAR 100 UNIT/ML Solostar Pen Inject 14 Units into the skin daily before lunch.     . levothyroxine (SYNTHROID, LEVOTHROID) 50 MCG tablet Take 50 mcg by mouth daily.     [facility] lisinopril (PRINIVIL,ZESTRIL) 40 MG tablet Take 40 mg by mouth daily.      No current facility-administered medications for this visit.     Allergies (verified) Review of patient's allergies indicates no known allergies.    PAST HISTORY  Family History Family History  Problem Relation Age of Onset  . Arthritis Mother   . Depression Mother   . Heart attack Father     54   . Hypertension Father     Social History Social History  Substance Use Topics  . Smoking status: Never Smoker   . Smokeless tobacco: Current User    Types: Snuff  . Alcohol Use: 0.6 oz/week    1 Standard drinks or equivalent per week     Comment: occasional beer     Are there smokers in your home (other than you)? No  Risk Factors Current exercise habits: active in garden  Dietary issues discussed: continue health diet   Cardiac risk factors: advanced age (older than 53 for men, 54 for women), dyslipidemia and smoking/ tobacco exposure.  Depression Screen (Note: if answer to either of the following is "Yes", a more complete depression screening is indicated)   Over the past two weeks, have you felt down, depressed or hopeless? No  Over the past two weeks, have you felt little interest or pleasure in doing things? No  Have you lost interest or pleasure in daily life? No  Do you often feel hopeless? No  Do you cry easily over simple problems? No  Activities of Daily Living In your present state of health, do you have any difficulty performing the following activities?:  Driving? Yes- no longer drives due to hearing Managing money?  No Feeding yourself? No Getting from bed to chair? No . Climbing a flight of stairs? No Preparing food and eating?: No Bathing or showering? No Getting dressed: No Getting to the toilet? No Using the toilet:No Moving around from place to place: No In the past year have you fallen or had a near fall?:No   Are you sexually active?  No  Do you have more than one partner?  No  Hearing Difficulties: Yes Do you often ask people to speak up or repeat themselves? Yes Do you experience ringing or noises in your ears? No Do you have difficulty understanding soft or whispered voices?  Yes   Do you feel that you have a problem with memory? No  Do you often misplace items? No  Do you feel safe at home?  Yes  Cognitive Testing  Alert? Yes  Normal Appearance?Yes  Oriented to person? Yes  Place? Yes   Time? Yes  Recall of three objects?  No  Can perform simple calculations? No  Displays appropriate judgment?Yes  Can read the correct time from a watch face?Yes   Advanced Directives have been discussed with the patient? Yes  List the Names of Other Physician/Practitioners you currently use: 1.  Shane Hudnall  Indicate any recent Medical Services you may have received from other than [facility] providers in the past  year (date may be approximate).  Immunization History  Administered Date(s) Administered  . Influenza-Unspecified 10/03/2013  . Pneumococcal Conjugate-13 10/03/2013  . Pneumococcal-Unspecified 01/03/2009  . Td 03/12/2007  . Tdap 01/04/2012  . Zoster 01/04/2011    Screening Tests Health Maintenance  Topic Date Due  . FOOT EXAM  12/09/1947  . DEXA SCAN  12/09/2002  . INFLUENZA VACCINE  08/04/2014  . OPHTHALMOLOGY EXAM  10/04/2014  . HEMOGLOBIN A1C  01/29/2015  . URINE MICROALBUMIN  07/29/2015  . TETANUS/TDAP  01/03/2022  . ZOSTAVAX  Completed  . PNA vac Low Risk Adult  Completed    All answers were reviewed with the patient and necessary referrals were made:  O'SULLIVAN,Jatin Naumann S., NP   10/13/2014   History reviewed: allergies, current medications, past family history, past medical history, past social history, past surgical history and problem list  Review of Systems Pertinent items are noted in HPI.    Objective:   Body mass index is 23.8 kg/(m^2). BP 145/72 mmHg  Pulse 62  Temp(Src) 98.4 F (36.9 C) (Oral)  Resp 20  Ht  (1.676 m)  Wt 147 lb 6.4 oz (66.86 kg)  BMI 23.80 kg/m2  SpO2 97%     Physical Exam  Constitutional: She is oriented to person, place, and time. She appears well-developed and well-nourished. No distress.   HENT:  Head: Normocephalic and atraumatic.  Right Ear: Tympanic membrane and ear canal normal.  Left Ear: Tympanic membrane and ear canal normal.  Mouth/Throat: Oropharynx is clear and moist.  Eyes: Pupils are equal, round, and reactive to light. No scleral icterus.  Neck: Normal range of motion. No thyromegaly present.  Cardiovascular: Normal rate and regular rhythm.   No murmur heard. Pulmonary/Chest: Effort normal and breath sounds normal. No respiratory distress. He has no wheezes. She has no rales. She exhibits no tenderness.  Abdominal: Soft. Bowel sounds are normal. He exhibits no distension and no mass. There is no tenderness. There is no rebound and no guarding.  Musculoskeletal: She exhibits no edema.  Lymphadenopathy:    She has no cervical adenopathy.  Neurological: She is alert and oriented to person, place, and time. She exhibits normal muscle tone. Coordination normal.  Skin: Skin is warm and dry.  Psychiatric: She has a normal mood and affect. Her behavior is normal. Judgment and thought content normal.  Breasts: Examined lying Right: Without masses, retractions, discharge or axillary adenopathy.  Left: Without masses, retractions, discharge or axillary adenopathy.          Assessment & Plan:      Assessment:          Plan:     During the course of the visit the patient was educated and counseled about appropriate screening and preventive services including:    Bone densitometry screening  Colorectal cancer screening  Diet review for nutrition referral? Yes ____  Not Indicated __x__   Patient Instructions (the written plan) was given to the patient.  Medicare Attestation I have personally reviewed: The patient's medical and social history Their use of alcohol, tobacco or illicit drugs Their current medications and supplements The patient's functional ability including ADLs,fall risks, home safety risks, cognitive, and hearing and visual  impairment Diet and physical activities Evidence for depression or mood disorders  The patient's weight, height, BMI, and visual acuity have been recorded in the chart.  I have made referrals, counseling, and provided education to the patient based on review of the above and I have provided the  patient with a written personalized care plan for preventive services.     O'SULLIVAN,Jerri Hargadon S., NP   10/13/2014

## 2014-10-13 NOTE — Progress Notes (Signed)
Pre visit review using our clinic review tool, if applicable. No additional management support is needed unless otherwise documented below in the visit note. 

## 2014-10-30 ENCOUNTER — Other Ambulatory Visit: Payer: Self-pay | Admitting: Family

## 2014-12-01 ENCOUNTER — Other Ambulatory Visit: Payer: Self-pay | Admitting: *Deleted

## 2014-12-01 MED ORDER — HYDROXYZINE HCL 25 MG PO TABS: 25.0000 mg | ORAL_TABLET | Freq: Two times a day (BID) | ORAL | Status: DC | PRN

## 2014-12-01 MED ORDER — LEVOTHYROXINE SODIUM 50 MCG PO TABS: 50.0000 ug | ORAL_TABLET | Freq: Every day | ORAL | Status: DC

## 2014-12-01 NOTE — Telephone Encounter (Signed)
Pt's daughter called requesting refills on pt's levothyroxine and hydroxyzine.  Levothyroxine refill sent, please advise hydroxyzine?

## 2014-12-01 NOTE — Telephone Encounter (Signed)
Per verbal from PCP, ok to send hydroxyzine #60 x 2 refills. Rx sent. Notified pt's daughter.

## 2014-12-02 ENCOUNTER — Telehealth: Payer: Self-pay | Admitting: Family

## 2014-12-02 DIAGNOSIS — H43313 Vitreous membranes and strands, bilateral: Secondary | ICD-10-CM | POA: Diagnosis not present

## 2014-12-02 DIAGNOSIS — E113293 Type 2 diabetes mellitus with mild nonproliferative diabetic retinopathy without macular edema, bilateral: Secondary | ICD-10-CM | POA: Diagnosis not present

## 2014-12-02 DIAGNOSIS — H47092 Other disorders of optic nerve, not elsewhere classified, left eye: Secondary | ICD-10-CM | POA: Diagnosis not present

## 2014-12-02 DIAGNOSIS — H2513 Age-related nuclear cataract, bilateral: Secondary | ICD-10-CM | POA: Diagnosis not present

## 2014-12-02 LAB — HM DIABETES EYE EXAM

## 2014-12-02 NOTE — Telephone Encounter (Signed)
Verified with Row[facility] at [facility] that [facility] received below Rx last night. Left message on pt's home # to check with pharmacy today.

## 2014-12-02 NOTE — Telephone Encounter (Signed)
Relation to WU:JWJXpt:self Call back [phone] Pharmacy: [facility] [place] - [address] [phone] (Phone) [phone] (Fax)         Reason for call:  Patient requesting a refill hydrOXYzine (ATARAX/VISTARIL) 25 MG tablet

## 2014-12-15 ENCOUNTER — Telehealth: Payer: Self-pay | Admitting: Family

## 2014-12-15 NOTE — Telephone Encounter (Signed)
Melissa-- I do not see that we have filled below meds for pt before.  Ok to send refills?

## 2014-12-15 NOTE — Telephone Encounter (Signed)
Caller name:lynn Relationship to patient:pharmacist Can be ZOXWRUE:[phone]:[phone] Pharmacy:[facility] Reason for call:carvedilol 12.5 take 1 twice a day with meals  Needs 180  Glipizide 10 mg qty 90 1 per day

## 2014-12-16 MED ORDER — GLIPIZIDE XL 10 MG PO TB24: 10.0000 mg | ORAL_TABLET | Freq: Every day | ORAL | Status: DC

## 2014-12-16 MED ORDER — CARVEDILOL 12.5 MG PO TABS: 12.5000 mg | ORAL_TABLET | Freq: Two times a day (BID) | ORAL | Status: DC

## 2014-12-16 NOTE — Telephone Encounter (Signed)
Ok

## 2014-12-16 NOTE — Telephone Encounter (Signed)
Refills sent

## 2015-01-02 ENCOUNTER — Encounter: Payer: Self-pay | Admitting: Family

## 2015-01-09 ENCOUNTER — Telehealth: Payer: Self-pay | Admitting: *Deleted

## 2015-01-09 MED ORDER — ATORVASTATIN CALCIUM 10 MG PO TABS: 10.0000 mg | ORAL_TABLET | Freq: Every day | ORAL | Status: DC

## 2015-01-09 NOTE — Telephone Encounter (Signed)
Received request for refills of atovastatin 10mg . Rx sent.

## 2015-01-13 MED ORDER — ATORVASTATIN CALCIUM 10 MG PO TABS: 10.0000 mg | ORAL_TABLET | Freq: Every day | ORAL | Status: DC

## 2015-01-13 NOTE — Addendum Note (Signed)
Addended by: Mervin KungFERGERSON, Ash Mcelwain A on: 01/13/2015 03:49 PM   Modules accepted: Orders

## 2015-01-13 NOTE — Telephone Encounter (Signed)
Received fax requesting 90 day supply of atorvastatin from [facility].  Refill re-sent.

## 2015-01-19 ENCOUNTER — Ambulatory Visit (INDEPENDENT_AMBULATORY_CARE_PROVIDER_SITE_OTHER): Payer: Medicare Other | Admitting: Family

## 2015-01-19 ENCOUNTER — Encounter: Payer: Self-pay | Admitting: Family

## 2015-01-19 VITALS — BP 140/68 | HR 66 | Temp 98.6°F | Resp 18 | Ht 66.0 in | Wt 147.6 lb

## 2015-01-19 DIAGNOSIS — I10 Essential (primary) hypertension: Secondary | ICD-10-CM | POA: Diagnosis not present

## 2015-01-19 DIAGNOSIS — Z794 Long term (current) use of insulin: Secondary | ICD-10-CM | POA: Diagnosis not present

## 2015-01-19 DIAGNOSIS — F418 Other specified anxiety disorders: Secondary | ICD-10-CM

## 2015-01-19 DIAGNOSIS — E039 Hypothyroidism, unspecified: Secondary | ICD-10-CM

## 2015-01-19 DIAGNOSIS — E785 Hyperlipidemia, unspecified: Secondary | ICD-10-CM

## 2015-01-19 DIAGNOSIS — E119 Type 2 diabetes mellitus without complications: Secondary | ICD-10-CM | POA: Diagnosis not present

## 2015-01-19 MED ORDER — LEVOTHYROXINE SODIUM 50 MCG PO TABS: 50.0000 ug | ORAL_TABLET | Freq: Every day | ORAL | Status: DC

## 2015-01-19 MED ORDER — BUPROPION HCL 100 MG PO TABS: ORAL_TABLET | ORAL | Status: DC

## 2015-01-19 MED ORDER — LISINOPRIL 40 MG PO TABS: 40.0000 mg | ORAL_TABLET | Freq: Every day | ORAL | Status: DC

## 2015-01-19 MED ORDER — LANTUS SOLOSTAR 100 UNIT/ML ~~LOC~~ SOPN: 14.0000 [IU] | PEN_INJECTOR | Freq: Every day | SUBCUTANEOUS | Status: DC

## 2015-01-19 MED ORDER — AMLODIPINE BESYLATE 5 MG PO TABS: 5.0000 mg | ORAL_TABLET | Freq: Every day | ORAL | Status: DC

## 2015-01-19 NOTE — Assessment & Plan Note (Signed)
Stable on wellbutrin and hydroxyzine. Continue same.

## 2015-01-19 NOTE — Progress Notes (Signed)
Pre visit review using our clinic review tool, if applicable. No additional management support is needed unless otherwise documented below in the visit note. 

## 2015-01-19 NOTE — Patient Instructions (Signed)
Please complete lab work prior to leaving. Call if ear pain worsens or if it does not improve.

## 2015-01-19 NOTE — Assessment & Plan Note (Signed)
Lipids are stable on atorvastatin. 

## 2015-01-19 NOTE — Assessment & Plan Note (Signed)
BP is stable.  Continue current meds. Obtain bmet.  

## 2015-01-19 NOTE — Assessment & Plan Note (Signed)
Stable on synthroid, obtain TSH.  

## 2015-01-19 NOTE — Assessment & Plan Note (Signed)
Stable A1C for her age. Continue same.

## 2015-01-19 NOTE — Progress Notes (Signed)
Subjective:    Patient ID: Jessica Meyer, female    DOB: 03/15/1935, 80 y.o.   MRN: [id number]  HPI  [name] is a 80 yr old female who presents today for follow up.  1) DM2- Maintained on lantus 14 units once daily. She denies hypoglycemia- was 115 this AM.   Lab Results  Component Value Date   HGBA1C 7.6* 07/29/2014   Lab Results  Component Value Date   MICROALBUR <0.7 07/29/2014   LDLCALC 86 07/29/2014   2) HTN- current BP meds include amlodipine, coreg, lisinopril.  BP Readings from Last 3 Encounters:  01/19/15 140/68  10/13/14 145/72  09/15/14 150/77   3) Hyperlipidemia- Patient is maintained on atorvastatin.   Lab Results  Component Value Date   CHOL 154 07/29/2014   HDL 44.30 07/29/2014   LDLCALC 86 07/29/2014   TRIG 121.0 07/29/2014   CHOLHDL 3 07/29/2014   4) Hypothyroidism- maintained on synthroid 50 mcg.  Lab Results  Component Value Date   TSH 4.05 07/29/2014   5) Ear Pain- Reports pain and soreness of the right ear.  Started about 1 week ago.    6) Depression- maintained on wellbutrin, continus hydroxyzine 2 x daily anxiety. Reports mood is good.   Review of Systems  Respiratory: Negative for shortness of breath.   Cardiovascular: Negative for chest pain and leg swelling.   See HPI  Past Medical History  Diagnosis Date  . Diabetes mellitus without complication (HCC)   . Hypertension   . Hyperlipidemia   . Arthritis   . History of chicken pox   . Thyroid disease     hypothyroid    Social History   Social History  . Marital Status: Single    Spouse Name: N/A  . Number of Children: N/A  . Years of Education: N/A   Occupational History  . Not on file.   Social History Main Topics  . Smoking status: Never Smoker   . Smokeless tobacco: Current User    Types: Snuff  . Alcohol Use: 0.6 oz/week    1 Standard drinks or equivalent per week     Comment: occasional beer  . Drug Use: No  . Sexual Activity: Not on file   Other Topics  Concern  . Not on file   Social History Narrative   Retired homemaker   Widow   Completed 7th grade   2 daughter   Shari HeritageSon and daughter. Daughter local, son in [place]   4 grandchildren   Enjoys puzzles, gardening.       Past Surgical History  Procedure Laterality Date  . Knee arthroscopy Bilateral   . Abdominal hysterectomy    . Replacement total knee bilateral      1998 left , 1992 right    Family History  Problem Relation Age of Onset  . Arthritis Mother   . Depression Mother   . Heart attack Father     [id number]   . Hypertension Father     No Known Allergies  Current Outpatient Prescriptions on File Prior to Visit  Medication Sig Dispense Refill  . amLODipine (NORVASC) 5 MG tablet Take 1 tablet (5 mg total) by mouth daily. 90 tablet 1  . atorvastatin (LIPITOR) 10 MG tablet Take 1 tablet (10 mg total) by mouth at bedtime. 90 tablet 1  . B-D UF III MINI PEN NEEDLES 31G X 5 MM MISC     . buPROPion (WELLBUTRIN) 100 MG tablet TAKE 1 TABLET  BY MOUTH  2 TIMES DAILY. 60 tablet 0  . carvedilol (COREG) 12.5 MG tablet Take 1 tablet (12.5 mg total) by mouth 2 (two) times daily with a meal. 180 tablet 1  . GLIPIZIDE XL 10 MG 24 hr tablet Take 1 tablet (10 mg total) by mouth daily with breakfast. 90 tablet 1  . hydrOXYzine (ATARAX/VISTARIL) 25 MG tablet Take 1 tablet (25 mg total) by mouth 2 (two) times daily as needed for anxiety. 60 tablet 2  . LANTUS SOLOSTAR 100 UNIT/ML Solostar Pen Inject 14 Units into the skin daily before lunch.     . levothyroxine (SYNTHROID, LEVOTHROID) 50 MCG tablet Take 1 tablet (50 mcg total) by mouth daily. 30 tablet 2  . lisinopril (PRINIVIL,ZESTRIL) 40 MG tablet Take 40 mg by mouth daily.      No current facility-administered medications on file prior to visit.    BP 140/68 mmHg  Pulse 66  Temp(Src) 98.6 F (37 C) (Oral)  Resp 18  Ht 5\' 6"  (1.676 m)  Wt 147 lb 9.6 oz (66.951 kg)  BMI 23.83 kg/m2  SpO2 99%       Objective:   Physical Exam    Constitutional: She is oriented to person, place, and time. She appears well-developed and well-nourished.  HENT:  Head: Normocephalic and atraumatic.  Right Ear: Tympanic membrane and ear canal normal.  Left Ear: Tympanic membrane and ear canal normal.  Cardiovascular: Normal rate, regular rhythm and normal heart sounds.   No murmur heard. Pulmonary/Chest: Effort normal and breath sounds normal. No respiratory distress. She has no wheezes.  Neurological: She is alert and oriented to person, place, and time.  Psychiatric: She has a normal mood and affect. Her behavior is normal. Judgment and thought content normal.          Assessment & Plan:

## 2015-01-20 ENCOUNTER — Encounter: Payer: Self-pay | Admitting: Family

## 2015-01-20 LAB — BASIC METABOLIC PANEL
BUN: 30 mg/dL — ABNORMAL HIGH (ref 6–23)
CHLORIDE: 107 meq/L (ref 96–112)
CO2: 27 meq/L (ref 19–32)
Calcium: 9.9 mg/dL (ref 8.4–10.5)
Creatinine, Ser: 1.46 mg/dL — ABNORMAL HIGH (ref 0.40–1.20)
GFR: 36.91 mL/min — ABNORMAL LOW (ref >60.00)
GLUCOSE: 81 mg/dL (ref 70–99)
POTASSIUM: 4.2 meq/L (ref 3.5–5.1)
SODIUM: 141 meq/L (ref 135–145)

## 2015-01-20 LAB — HEMOGLOBIN A1C: Hgb A1c MFr Bld: 7.5 % — ABNORMAL HIGH (ref 4.6–6.5)

## 2015-01-26 ENCOUNTER — Telehealth: Payer: Self-pay | Admitting: Family

## 2015-01-26 NOTE — Telephone Encounter (Signed)
Caller name: Felicita Gage Relationship to patient: daughter Can be reached:  [phone] Pharmacy: [facility]  Reason for call: Pt dtr called asking that we resent RX for test strips to [facility]. We sent last week to [facility] and they were over $200. Also please send Lantus to [facility] for next fill.

## 2015-01-26 NOTE — Telephone Encounter (Signed)
Spoke with pt's daughter to verify name of test strips and she does not have that information available. Will call [facility] to verify. Unable to reach them at this time and will try tomorrow.

## 2015-01-27 MED ORDER — GLUCOSE BLOOD VI STRP
ORAL_STRIP | Status: DC
Start: 2015-01-27 — End: 2015-01-27

## 2015-01-27 MED ORDER — LANTUS SOLOSTAR 100 UNIT/ML ~~LOC~~ SOPN
14.0000 [IU] | PEN_INJECTOR | Freq: Every day | SUBCUTANEOUS | Status: DC
Start: 2015-01-27 — End: 2015-07-23

## 2015-01-27 MED ORDER — GLUCOSE BLOOD VI STRP: ORAL_STRIP | Status: DC

## 2015-01-27 NOTE — Telephone Encounter (Signed)
Lantus and Test strips sent to pharmacy.

## 2015-01-27 NOTE — Telephone Encounter (Signed)
Caller name: [name]  Relationship to patient: daughter   Can be reached: [phone]  Pharmacy: [facility]   Reason for call: Pt's daughter called in to give the CMA the name of her moms meter. One Touch Ultra.   Informed daughter that it will be taken care of. No call back needed.   Thanks.

## 2015-02-04 NOTE — Telephone Encounter (Signed)
Completed form faxed to [facility] at [phone].

## 2015-02-04 NOTE — Telephone Encounter (Signed)
Received fax from [facility] that pt's testing supplies were transferred to them on 01/27/15.  Spoke with [place] at [facility] and she confirmed that they are unable to bill pt's Medicare Part B for testing supplies. They also transferred pt's Lantus to [facility].  Form completed and forwarded to PCP for signature.

## 2015-03-01 ENCOUNTER — Other Ambulatory Visit: Payer: Self-pay | Admitting: Family

## 2015-03-22 ENCOUNTER — Other Ambulatory Visit: Payer: Self-pay | Admitting: Family

## 2015-03-24 NOTE — Telephone Encounter (Signed)
Medication filled to pharmacy as requested.   

## 2015-04-20 ENCOUNTER — Ambulatory Visit: Payer: Medicare Other | Admitting: Family

## 2015-05-07 ENCOUNTER — Encounter (HOSPITAL_BASED_OUTPATIENT_CLINIC_OR_DEPARTMENT_OTHER): Payer: Self-pay | Admitting: *Deleted

## 2015-05-07 ENCOUNTER — Emergency Department (HOSPITAL_BASED_OUTPATIENT_CLINIC_OR_DEPARTMENT_OTHER): Payer: Medicare Other

## 2015-05-07 ENCOUNTER — Emergency Department (HOSPITAL_BASED_OUTPATIENT_CLINIC_OR_DEPARTMENT_OTHER)
Admission: EM | Admit: 2015-05-07 | Discharge: 2015-05-07 | Disposition: A | Discharge status: Home / Self Care | Payer: Medicare Other | Attending: Emergency Medicine | Admitting: Emergency Medicine

## 2015-05-07 DIAGNOSIS — E785 Hyperlipidemia, unspecified: Secondary | ICD-10-CM | POA: Diagnosis not present

## 2015-05-07 DIAGNOSIS — E119 Type 2 diabetes mellitus without complications: Secondary | ICD-10-CM | POA: Insufficient documentation

## 2015-05-07 DIAGNOSIS — M79641 Pain in right hand: Secondary | ICD-10-CM | POA: Diagnosis not present

## 2015-05-07 DIAGNOSIS — I10 Essential (primary) hypertension: Secondary | ICD-10-CM | POA: Insufficient documentation

## 2015-05-07 DIAGNOSIS — M79644 Pain in right finger(s): Secondary | ICD-10-CM | POA: Diagnosis not present

## 2015-05-07 DIAGNOSIS — Z794 Long term (current) use of insulin: Secondary | ICD-10-CM | POA: Insufficient documentation

## 2015-05-07 DIAGNOSIS — E0789 Other specified disorders of thyroid: Secondary | ICD-10-CM | POA: Diagnosis not present

## 2015-05-07 DIAGNOSIS — Z79899 Other long term (current) drug therapy: Secondary | ICD-10-CM | POA: Diagnosis not present

## 2015-05-07 DIAGNOSIS — S6991XA Unspecified injury of right wrist, hand and finger(s), initial encounter: Secondary | ICD-10-CM | POA: Diagnosis not present

## 2015-05-07 MED ORDER — ACETAMINOPHEN 325 MG PO TABS
650.0000 mg | ORAL_TABLET | Freq: Once | ORAL | Status: AC
  Administered 2015-05-07: 650 mg via ORAL
  Filled 2015-05-07: qty 2

## 2015-05-07 MED ORDER — ONDANSETRON HCL 4 MG/2ML IJ SOLN: 4.0000 mg | INTRAMUSCULAR | Status: DC

## 2015-05-07 MED ORDER — IBUPROFEN 600 MG PO TABS: 600.0000 mg | ORAL_TABLET | Freq: Four times a day (QID) | ORAL | Status: DC | PRN

## 2015-05-07 NOTE — ED Notes (Signed)
Pt c/o pain and swelling to right hand after falling yesterday and trying to catch herself with her hand.  She is using ibuprofen for pain, last dose was 600mg  at 1700.

## 2015-05-07 NOTE — ED Provider Notes (Signed)
CSN: [id number]     Arrival date & time 05/07/15  2035 History   First MD Initiated Contact with Patient 05/07/15 2100     Chief Complaint  Patient presents with  . Fall  . Hand Injury    HPI   Jessica Meyer is a 80 y.o. female with a PMH of HTN, HLD, DM who presents to the ED with right hand pain and swelling, which she states started yesterday after she fell and braced herself with her hand. She states her pain is constant. She reports movement exacerbates her pain. She has tried ibuprofen with no significant symptom relief. She denies hitting her head, LOC, additional injury, headache, lightheadedness, dizziness, chest pain, shortness of breath, abdominal pain, nausea, vomiting, numbness, weakness, paresthesia. She does not take anticoagulants.   Past Medical History  Diagnosis Date  . Diabetes mellitus without complication (HCC)   . Hypertension   . Hyperlipidemia   . Arthritis   . History of chicken pox   . Thyroid disease     hypothyroid   Past Surgical History  Procedure Laterality Date  . Knee arthroscopy Bilateral   . Abdominal hysterectomy    . Replacement total knee bilateral      1998 left , 1992 right   Family History  Problem Relation Age of Onset  . Arthritis Mother   . Depression Mother   . Heart attack Father     45   . Hypertension Father    Social History  Substance Use Topics  . Smoking status: Never Smoker   . Smokeless tobacco: Current User    Types: Snuff  . Alcohol Use: 0.6 oz/week    1 Standard drinks or equivalent per week     Comment: occasional beer   OB History    No data available       Review of Systems  Respiratory: Negative for shortness of breath.   Cardiovascular: Negative for chest pain.  Gastrointestinal: Negative for nausea, vomiting and abdominal pain.  Musculoskeletal: Positive for arthralgias. Negative for back pain and neck pain.  Neurological: Negative for dizziness, syncope, weakness, light-headedness, numbness and  headaches.  All other systems reviewed and are negative.     Allergies  Review of patient's allergies indicates no known allergies.  Home Medications   Prior to Admission medications   Medication Sig Start Date End Date Taking? Authorizing Provider  amLODipine (NORVASC) 5 MG tablet Take 1 tablet (5 mg total) by mouth daily. 01/19/15   Sandford Craze, NP  atorvastatin (LIPITOR) 10 MG tablet Take 1 tablet (10 mg total) by mouth at bedtime. 01/13/15   Sandford Craze, NP  B-D UF III MINI PEN NEEDLES 31G X 5 MM MISC  02/11/14   Historical Provider, MD  buPROPion (WELLBUTRIN) 100 MG tablet TAKE 1 TABLET  BY MOUTH 2 TIMES DAILY. 01/19/15   Sandford Craze, NP  carvedilol (COREG) 12.5 MG tablet Take 1 tablet (12.5 mg total) by mouth 2 (two) times daily with a meal. 12/16/14   Sandford Craze, NP  GLIPIZIDE XL 10 MG 24 hr tablet Take 1 tablet (10 mg total) by mouth daily with breakfast. 12/16/14   Sandford Craze, NP  glucose blood (ONE TOUCH ULTRA TEST) test strip Use as instructed to check blood sugar twice a day. 01/27/15   Sandford Craze, NP  hydrOXYzine (ATARAX/VISTARIL) 25 MG tablet TAKE 1 TABLET (25 MG TOTAL) BY MOUTH 2 (TWO) TIMES DAILY AS NEEDED FOR ANXIETY. 03/02/15   Sandford Craze, NP  LANTUS  SOLOSTAR 100 UNIT/ML Solostar Pen Inject 14 Units into the skin daily before lunch. 01/27/15   Sandford CrazeMelissa O'Sullivan, NP  levothyroxine (SYNTHROID, LEVOTHROID) 50 MCG tablet TAKE 1 TABLET (50 MCG TOTAL) BY MOUTH DAILY. 03/24/15   Sandford CrazeMelissa O'Sullivan, NP  lisinopril (PRINIVIL,ZESTRIL) 40 MG tablet Take 1 tablet (40 mg total) by mouth daily. 01/19/15   Sandford CrazeMelissa O'Sullivan, NP    BP 141/78 mmHg  Pulse 82  Temp(Src) 99 F (37.2 C) (Oral)  Resp 16  Ht 5\' 5"  (1.651 m)  Wt 65.772 kg  BMI 24.13 kg/m2  SpO2 96% Physical Exam  Constitutional: She is oriented to person, place, and time. She appears well-developed and well-nourished. No distress.  HENT:  Head: Normocephalic and atraumatic.   Right Ear: External ear normal.  Left Ear: External ear normal.  Nose: Nose normal.  Mouth/Throat: Uvula is midline, oropharynx is clear and moist and mucous membranes are normal.  Eyes: Conjunctivae, EOM and lids are normal. Pupils are equal, round, and reactive to light. Right eye exhibits no discharge. Left eye exhibits no discharge. No scleral icterus.  Neck: Normal range of motion. Neck supple.  Cardiovascular: Normal rate, regular rhythm, normal heart sounds, intact distal pulses and normal pulses.   Pulmonary/Chest: Effort normal and breath sounds normal. No respiratory distress. She has no wheezes. She has no rales. She exhibits no tenderness.  Abdominal: Soft. Normal appearance and bowel sounds are normal. She exhibits no distension and no mass. There is no tenderness. There is no rigidity, no rebound and no guarding.  Musculoskeletal: She exhibits edema and tenderness.  TTP and associated edema to dorsal and palmar aspects of right hand over 3rd and 4th metacarpals and 3rd finger. Superficial abrasion to right 3rd finger. Decreased ROM of 3rd finger with extension. Sensation to light touch intact. Distal pulses intact. Otherwise, no TTP to upper or lower extremities bilaterally. Pelvis stable. No TTP to C/T/L spine.  Neurological: She is alert and oriented to person, place, and time. She has normal strength. No sensory deficit.  Skin: Skin is warm, dry and intact. No rash noted. She is not diaphoretic. No erythema. No pallor.  Superficial abrasion to right anterior shin. No TTP.  Psychiatric: She has a normal mood and affect. Her speech is normal and behavior is normal.  Nursing note and vitals reviewed.   ED Course  Procedures (including critical care time)  Labs Review Labs Reviewed - No data to display  Imaging Review Dg Hand Complete Right  05/07/2015  CLINICAL DATA:  H/O arthritis in joints; patient fell earlier this evening-when she fell the back of her right hand hit a  brick; pain over the right middle finger and metacarpal EXAM: RIGHT HAND - COMPLETE 3+ VIEW COMPARISON:  None. FINDINGS: No fracture or dislocation. Bones are demineralized. There are minor changes of osteoarthritis involving the DIP joints. Unremarkable soft tissues. IMPRESSION: No fracture or dislocation. Electronically Signed   By: Amie Portlandavid  Ormond M.D.   On: 05/07/2015 21:12   I have personally reviewed and evaluated these images as part of my medical decision-making.   EKG Interpretation None      MDM   Final diagnoses:  Right hand pain    80 year old female presents with right hand pain, which she states started yesterday after she fell and braced herself with her hand. Denies hitting her head, LOC, additional injury, numbness, weakness, paresthesia. Patient is afebrile. Vital signs stable. TTP and associated edema to dorsal and palmar aspects of right hand over 3rd and  4th metacarpals and 3rd finger. Superficial abrasion to right 3rd finger. Decreased ROM of 3rd finger with extension. Patient is NVI.  Imaging of right hand negative for fracture or dislocation. Patient discussed with and seen by Dr. Verdie Mosher.  Given concern for tenosynovitis, hand surgery consulted.  Spoke with Dr. Izora Ribas, who advised tenosynovitis unlikely given duration of time since injury, symptoms likely due to tendon injury.   Discussed findings with patient, she is non-toxic and well-appearing, feel she is stable for discharge at this time. Will place in finger splint. Advised to rest, ice, and elevate and to take ibuprofen or tylenol for pain. Patient to follow-up with hand. Strict return precautions discussed. Patient verbalizes her understanding and is in agreement with plan.  BP 141/78 mmHg  Pulse 82  Temp(Src) 99 F (37.2 C) (Oral)  Resp 16  Ht 5\' 5"  (1.651 m)  Wt 65.772 kg  BMI 24.13 kg/m2  SpO2 96%     Mady Gemma, PA-C 05/07/15 2323  Lavera Guise, MD 05/08/15 [phone]

## 2015-05-07 NOTE — ED Notes (Signed)
She stumbled and fell in her yard yesterday. Her right hand hit a brick wall. Swelling. Abrasions. Pain. Redness.

## 2015-05-07 NOTE — Discharge Instructions (Signed)
1. Medications: tylenol or ibuprofen for pain, usual home medications 2. Treatment: rest, ice, elevate 3. Follow Up: please followup with your PCP in 2-3 days for recheck of your hand and with the hand specialist for persistent symptoms; please return to the ER for increased pain, swelling, numbness, weakness, new or worsening symptoms

## 2015-05-11 ENCOUNTER — Ambulatory Visit (INDEPENDENT_AMBULATORY_CARE_PROVIDER_SITE_OTHER): Payer: Medicare Other | Admitting: Family

## 2015-05-11 ENCOUNTER — Encounter: Payer: Self-pay | Admitting: Family

## 2015-05-11 VITALS — BP 144/70 | HR 60 | Temp 98.2°F | Resp 20 | Ht 66.0 in | Wt 147.8 lb

## 2015-05-11 DIAGNOSIS — E119 Type 2 diabetes mellitus without complications: Secondary | ICD-10-CM

## 2015-05-11 DIAGNOSIS — F418 Other specified anxiety disorders: Secondary | ICD-10-CM

## 2015-05-11 DIAGNOSIS — Z794 Long term (current) use of insulin: Secondary | ICD-10-CM

## 2015-05-11 DIAGNOSIS — E785 Hyperlipidemia, unspecified: Secondary | ICD-10-CM

## 2015-05-11 DIAGNOSIS — S6991XA Unspecified injury of right wrist, hand and finger(s), initial encounter: Secondary | ICD-10-CM

## 2015-05-11 DIAGNOSIS — E039 Hypothyroidism, unspecified: Secondary | ICD-10-CM | POA: Diagnosis not present

## 2015-05-11 NOTE — Assessment & Plan Note (Addendum)
Continue current meds. Obtain A1C.  Consider d/c glipizide due to risk of hypoglycemia. Will see how A1C looks.

## 2015-05-11 NOTE — Progress Notes (Signed)
Pre visit review using our clinic review tool, if applicable. No additional management support is needed unless otherwise documented below in the visit note. 

## 2015-05-11 NOTE — Progress Notes (Signed)
Subjective:    Patient ID: Donnel Saxonoris Ann Cada, female    DOB: 10/13/1935, 80 y.o.   MRN: [id number]  HPI  Ms. Freida Busmanllen is a 80 yr old female who presents today for ER follow up.  Record is reviewed.  She was seen in ED on 05/07/15 following a mechanical fall on 05/06/15. Reports she fell backwards in her garden.  She injured her right middle finger. X ray was negative for fracture or dislocation.  Case was reviewed with Dr. Izora Ribasoley (by ED staff) who felt that symptom likely due to tendon injury. Still having pain, swelling right middle finger. She has the finger in a brace.  Dm2- reports sugar is generally 120. Maintained on lantus 14 units as well as glipizide.   Lab Results  Component Value Date   HGBA1C 7.5* 01/19/2015   HGBA1C 7.6* 07/29/2014   Lab Results  Component Value Date   MICROALBUR <0.7 07/29/2014   LDLCALC 86 07/29/2014   CREATININE 1.46* 01/19/2015   Hyperlipidemia- maintained on atorvastatin. Lab Results  Component Value Date   CHOL 154 07/29/2014   HDL 44.30 07/29/2014   LDLCALC 86 07/29/2014   TRIG 121.0 07/29/2014   CHOLHDL 3 07/29/2014   Depression- maintained on wellbutrin. Reports that mood is good.   Hypothyroid- continued on synthroid.  Lab Results  Component Value Date   TSH 4.05 07/29/2014     Review of Systems    see HPI  Past Medical History  Diagnosis Date  . Diabetes mellitus without complication (HCC)   . Hypertension   . Hyperlipidemia   . Arthritis   . History of chicken pox   . Thyroid disease     hypothyroid     Social History   Social History  . Marital Status: Single    Spouse Name: N/A  . Number of Children: N/A  . Years of Education: N/A   Occupational History  . Not on file.   Social History Main Topics  . Smoking status: Never Smoker   . Smokeless tobacco: Current User    Types: Snuff  . Alcohol Use: 0.6 oz/week    1 Standard drinks or equivalent per week     Comment: occasional beer  . Drug Use: No  . Sexual  Activity: Not on file   Other Topics Concern  . Not on file   Social History Narrative   Retired homemaker   Widow   Completed 7th grade   2 daughter   Shari HeritageSon and daughter. Daughter local, son in [place]   4 grandchildren   Enjoys puzzles, gardening.       Past Surgical History  Procedure Laterality Date  . Knee arthroscopy Bilateral   . Abdominal hysterectomy    . Replacement total knee bilateral      1998 left , 1992 right    Family History  Problem Relation Age of Onset  . Arthritis Mother   . Depression Mother   . Heart attack Father     4157   . Hypertension Father     No Known Allergies  Current Outpatient Prescriptions on File Prior to Visit  Medication Sig Dispense Refill  . amLODipine (NORVASC) 5 MG tablet Take 1 tablet (5 mg total) by mouth daily. 90 tablet 1  . atorvastatin (LIPITOR) 10 MG tablet Take 1 tablet (10 mg total) by mouth at bedtime. 90 tablet 1  . B-D UF III MINI PEN NEEDLES 31G X 5 MM MISC     . buPROPion [facility](WELLBUTRIN)  100 MG tablet TAKE 1 TABLET  BY MOUTH 2 TIMES DAILY. 180 tablet 1  . carvedilol (COREG) 12.5 MG tablet Take 1 tablet (12.5 mg total) by mouth 2 (two) times daily with a meal. 180 tablet 1  . GLIPIZIDE XL 10 MG 24 hr tablet Take 1 tablet (10 mg total) by mouth daily with breakfast. 90 tablet 1  . glucose blood (ONE TOUCH ULTRA TEST) test strip Use as instructed to check blood sugar twice a day. 200 each 1  . hydrOXYzine (ATARAX/VISTARIL) 25 MG tablet TAKE 1 TABLET (25 MG TOTAL) BY MOUTH 2 (TWO) TIMES DAILY AS NEEDED FOR ANXIETY. 60 tablet 2  . ibuprofen (ADVIL,MOTRIN) 600 MG tablet Take 1 tablet (600 mg total) by mouth every 6 (six) hours as needed. 30 tablet 0  . LANTUS SOLOSTAR 100 UNIT/ML Solostar Pen Inject 14 Units into the skin daily before lunch. 15 mL 1  . levothyroxine (SYNTHROID, LEVOTHROID) 50 MCG tablet TAKE 1 TABLET (50 MCG TOTAL) BY MOUTH DAILY. 30 tablet 1  . lisinopril (PRINIVIL,ZESTRIL) 40 MG tablet Take 1 tablet (40 mg  total) by mouth daily. 90 tablet 1   No current facility-administered medications on file prior to visit.    BP 144/70 mmHg  Pulse 60  Temp(Src) 98.2 F (36.8 C) (Oral)  Resp 20  Ht  (1.676 m)  Wt 147 lb 12.8 oz (67.042 kg)  BMI 23.87 kg/m2  SpO2 99%    Objective:   Physical Exam  Constitutional: She is oriented to person, place, and time. She appears well-developed and well-nourished.  HENT:  Head: Normocephalic and atraumatic.  Cardiovascular: Normal rate, regular rhythm and normal heart sounds.   No murmur heard. Pulmonary/Chest: Effort normal and breath sounds normal. No respiratory distress. She has no wheezes.  Musculoskeletal: She exhibits no edema.  R middle finger swelling at MIP, only able to partially flex finger.   Neurological: She is alert and oriented to person, place, and time.  Psychiatric: She has a normal mood and affect. Her behavior is normal. Judgment and thought content normal.          Assessment & Plan:  Finger injury- advised pt to keep finger in brace.  Will arrange a formal consult with Dr. Izora Ribas.

## 2015-05-11 NOTE — Assessment & Plan Note (Signed)
Obtain TSH, continue synthroid.  

## 2015-05-11 NOTE — Assessment & Plan Note (Signed)
Stable on wellbutrin.  Hydroxyzine.

## 2015-05-11 NOTE — Patient Instructions (Signed)
Please complete lab work prior to leaving. You will be contacted about your referral to the hand specialist. Follow up in 4 months.

## 2015-05-12 ENCOUNTER — Telehealth: Payer: Self-pay | Admitting: Family

## 2015-05-12 LAB — LIPID PANEL
CHOLESTEROL: 150 mg/dL (ref 0–200)
HDL: 43.8 mg/dL (ref >39.00)
LDL Cholesterol: 71 mg/dL (ref 0–99)
NONHDL: 105.85
Total CHOL/HDL Ratio: 3
Triglycerides: 174 mg/dL — ABNORMAL HIGH (ref 0.0–149.0)
VLDL: 34.8 mg/dL (ref 0.0–40.0)

## 2015-05-12 LAB — BASIC METABOLIC PANEL
BUN: 29 mg/dL — AB (ref 6–23)
CHLORIDE: 105 meq/L (ref 96–112)
CO2: 25 mEq/L (ref 19–32)
Calcium: 10.1 mg/dL (ref 8.4–10.5)
Creatinine, Ser: 1.37 mg/dL — ABNORMAL HIGH (ref 0.40–1.20)
GFR: 39.69 mL/min — AB (ref >60.00)
Glucose, Bld: 69 mg/dL — ABNORMAL LOW (ref 70–99)
POTASSIUM: 4.2 meq/L (ref 3.5–5.1)
Sodium: 141 mEq/L (ref 135–145)

## 2015-05-12 LAB — MICROALBUMIN / CREATININE URINE RATIO
Creatinine,U: 69.6 mg/dL
MICROALB/CREAT RATIO: 1 mg/g (ref 0.0–30.0)
Microalb, Ur: <0.7 mg/dL (ref 0.0–1.9)

## 2015-05-12 LAB — TSH: TSH: 4.39 u[IU]/mL (ref 0.35–4.50)

## 2015-05-12 LAB — HEMOGLOBIN A1C: HEMOGLOBIN A1C: 7.5 % — AB (ref 4.6–6.5)

## 2015-05-12 NOTE — Telephone Encounter (Signed)
Please let patient know that her sugar is at goal. Please continue current meds. Let me know if she has any sugars <80. Kidney function is at goal. Thyroid test looks good on current dose of thyroid medication.

## 2015-05-13 NOTE — Telephone Encounter (Signed)
Notified pt and she voices understanding. 

## 2015-05-26 ENCOUNTER — Ambulatory Visit (INDEPENDENT_AMBULATORY_CARE_PROVIDER_SITE_OTHER): Payer: Medicare Other | Admitting: Family Medicine

## 2015-05-26 ENCOUNTER — Other Ambulatory Visit: Payer: Self-pay | Admitting: Emergency Medicine

## 2015-05-26 ENCOUNTER — Encounter: Payer: Self-pay | Admitting: Family Medicine

## 2015-05-26 VITALS — BP 152/75 | HR 66 | Ht 65.0 in | Wt 145.0 lb

## 2015-05-26 DIAGNOSIS — M25562 Pain in left knee: Secondary | ICD-10-CM

## 2015-05-26 DIAGNOSIS — S63612A Unspecified sprain of right middle finger, initial encounter: Secondary | ICD-10-CM

## 2015-05-26 MED ORDER — DICLOFENAC SODIUM 1 % TD GEL: 4.0000 g | Freq: Four times a day (QID) | TRANSDERMAL | Status: DC | PRN

## 2015-05-26 NOTE — Patient Instructions (Signed)
You have a finger sprain. Stop using a brace now. Focus on regaining your motion. I expect this to take 1-3 more weeks to completely resolve.  You have patellar tendinitis of your left knee. Start physical therapy at home for this. Patellar tendon strap if this feels better. Icing (heat is ok if you like this more) 15 minutes at a time 3-4 times a day. Voltaren gel up to 4 times a day as needed for pain and inflammation. Follow up with me in 6 weeks or as needed.

## 2015-05-27 ENCOUNTER — Other Ambulatory Visit: Payer: Self-pay | Admitting: Emergency Medicine

## 2015-05-27 MED ORDER — BD PEN NEEDLE MINI U/F 31G X 5 MM MISC: Status: DC

## 2015-05-28 ENCOUNTER — Other Ambulatory Visit: Payer: Self-pay | Admitting: Family

## 2015-05-28 DIAGNOSIS — IMO0001 Reserved for inherently not codable concepts without codable children: Secondary | ICD-10-CM | POA: Insufficient documentation

## 2015-05-28 DIAGNOSIS — M25562 Pain in left knee: Secondary | ICD-10-CM | POA: Insufficient documentation

## 2015-05-28 NOTE — Telephone Encounter (Signed)
Ok to refill pls

## 2015-05-28 NOTE — Telephone Encounter (Signed)
Rx sent 

## 2015-05-28 NOTE — Progress Notes (Signed)
PCP: Lemont Fillers., NP  Subjective:   HPI: Patient is a 80 y.o. female here for left knee pain, right 3rd finger pain.  Patient reports she's had about 6-8 weeks of anterior left knee pain. No acute injury around this time though 3 weeks ago she did fall and put right hand out to catch herself, injured right middle finger. This finger is just sore, 0/10 pain now though at rest. Stiff with flexion and extension. Had left knee replacement about 20 years ago. Pain is 6/10, worse with ambulation. No skin changes, numbness.  Past Medical History  Diagnosis Date  . Diabetes mellitus without complication (HCC)   . Hypertension   . Hyperlipidemia   . Arthritis   . History of chicken pox   . Thyroid disease     hypothyroid    Current Outpatient Prescriptions on File Prior to Visit  Medication Sig Dispense Refill  . amLODipine (NORVASC) 5 MG tablet Take 1 tablet (5 mg total) by mouth daily. 90 tablet 1  . atorvastatin (LIPITOR) 10 MG tablet Take 1 tablet (10 mg total) by mouth at bedtime. 90 tablet 1  . buPROPion (WELLBUTRIN) 100 MG tablet TAKE 1 TABLET  BY MOUTH 2 TIMES DAILY. 180 tablet 1  . carvedilol (COREG) 12.5 MG tablet Take 1 tablet (12.5 mg total) by mouth 2 (two) times daily with a meal. 180 tablet 1  . GLIPIZIDE XL 10 MG 24 hr tablet Take 1 tablet (10 mg total) by mouth daily with breakfast. 90 tablet 1  . glucose blood (ONE TOUCH ULTRA TEST) test strip Use as instructed to check blood sugar twice a day. 200 each 1  . hydrOXYzine (ATARAX/VISTARIL) 25 MG tablet TAKE 1 TABLET (25 MG TOTAL) BY MOUTH 2 (TWO) TIMES DAILY AS NEEDED FOR ANXIETY. 60 tablet 2  . ibuprofen (ADVIL,MOTRIN) 600 MG tablet Take 1 tablet (600 mg total) by mouth every 6 (six) hours as needed. 30 tablet 0  . LANTUS SOLOSTAR 100 UNIT/ML Solostar Pen Inject 14 Units into the skin daily before lunch. 15 mL 1  . levothyroxine (SYNTHROID, LEVOTHROID) 50 MCG tablet TAKE 1 TABLET (50 MCG TOTAL) BY MOUTH DAILY.  30 tablet 1  . lisinopril (PRINIVIL,ZESTRIL) 40 MG tablet Take 1 tablet (40 mg total) by mouth daily. 90 tablet 1   No current facility-administered medications on file prior to visit.    Past Surgical History  Procedure Laterality Date  . Knee arthroscopy Bilateral   . Abdominal hysterectomy    . Replacement total knee bilateral      1998 left , 1992 right    No Known Allergies  Social History   Social History  . Marital Status: Single    Spouse Name: N/A  . Number of Children: N/A  . Years of Education: N/A   Occupational History  . Not on file.   Social History Main Topics  . Smoking status: Never Smoker   . Smokeless tobacco: Current User    Types: Snuff  . Alcohol Use: 0.6 oz/week    1 Standard drinks or equivalent per week     Comment: occasional beer  . Drug Use: No  . Sexual Activity: Not on file   Other Topics Concern  . Not on file   Social History Narrative   Retired homemaker   Widow   Completed 7th grade   2 daughter   Shari Heritage and daughter. Daughter local, son in [place]   4 grandchildren   Enjoys puzzles, gardening.  Family History  Problem Relation Age of Onset  . Arthritis Mother   . Depression Mother   . Heart attack Father     [id number]   . Hypertension Father     BP 152/75 mmHg  Pulse 66  Ht 5\' 5"  (1.651 m)  Wt 145 lb (65.772 kg)  BMI 24.13 kg/m2  Review of Systems: See HPI above.    Objective:  Physical Exam:  Gen: NAD, comfortable in exam room  Right hand: No gross deformity, malrotation, angulation.  Mild swelling throughout. Minimal tenderness around PIP joint.  No other tenderness. FROM at MCP, PIP, DIP joints with 5/5 strength. Collateral ligaments intact. NVI distally.  Left knee: Well healed anterior TKR scar.   Mild TTP anteriorly over patellar tendon. FROM. Negative valgus/varus testing. Negative patellar apprehension. NV intact distally.    Assessment & Plan:  1. Left knee pain - s/p TKR.  No evidence of  loosening or description of such.  Brief MSK u/s does not show space between components and bone.  Consistent with patellar tendinitis.  Will start physical therapy, patellar tendon strap, icing, voltaren gel.  F/u in 6 weeks.  2. Right 3rd digit sprain - Independently reviewed radiographs and no evidence of fracture.  Tendons intact on exam.  No valgus/varus laxity to suggest collateral ligament tear.  Reassured patient.  Discontinue splinting.  Expect another 1-3 weeks to fully improve.

## 2015-05-28 NOTE — Assessment & Plan Note (Signed)
s/p TKR.  No evidence of loosening or description of such.  Brief MSK u/s does not show space between components and bone.  Consistent with patellar tendinitis.  Will start physical therapy, patellar tendon strap, icing, voltaren gel.  F/u in 6 weeks.

## 2015-05-28 NOTE — Telephone Encounter (Signed)
Pt is requesting refill on Hydroxyzine HCL 25mg  tablet.  Last OV: 05/11/2015 Last Fill: 03/02/2015 #60 and 2 RF Pt sig: 1 tablet BID PRN UDS: None  Please advise.

## 2015-05-28 NOTE — Assessment & Plan Note (Signed)
Independently reviewed radiographs and no evidence of fracture.  Tendons intact on exam.  No valgus/varus laxity to suggest collateral ligament tear.  Reassured patient.  Discontinue splinting.  Expect another 1-3 weeks to fully improve.

## 2015-05-29 ENCOUNTER — Telehealth: Payer: Self-pay | Admitting: Family Medicine

## 2015-05-29 NOTE — Telephone Encounter (Signed)
Spoke with [facility] and they are faxing over a form to be filled out.

## 2015-05-30 ENCOUNTER — Other Ambulatory Visit: Payer: Self-pay | Admitting: Family

## 2015-06-03 ENCOUNTER — Telehealth: Payer: Self-pay | Admitting: Family Medicine

## 2015-06-03 NOTE — Telephone Encounter (Signed)
Ok,noted

## 2015-06-11 ENCOUNTER — Other Ambulatory Visit: Payer: Self-pay | Admitting: Family

## 2015-07-07 ENCOUNTER — Other Ambulatory Visit: Payer: Self-pay | Admitting: Family

## 2015-07-22 ENCOUNTER — Other Ambulatory Visit: Payer: Self-pay | Admitting: Family

## 2015-07-27 ENCOUNTER — Other Ambulatory Visit: Payer: Self-pay | Admitting: Family

## 2015-08-19 ENCOUNTER — Other Ambulatory Visit: Payer: Self-pay | Admitting: Family

## 2015-08-22 ENCOUNTER — Other Ambulatory Visit: Payer: Self-pay | Admitting: Family

## 2015-09-14 ENCOUNTER — Ambulatory Visit (INDEPENDENT_AMBULATORY_CARE_PROVIDER_SITE_OTHER): Payer: Medicare Other | Admitting: Family

## 2015-09-14 ENCOUNTER — Ambulatory Visit (HOSPITAL_BASED_OUTPATIENT_CLINIC_OR_DEPARTMENT_OTHER)
Admission: RE | Admit: 2015-09-14 | Discharge: 2015-09-14 | Disposition: A | Discharge status: Home / Self Care | Payer: Medicare Other | Source: Ambulatory Visit | Attending: Family | Admitting: Family

## 2015-09-14 ENCOUNTER — Encounter: Payer: Self-pay | Admitting: Family

## 2015-09-14 VITALS — BP 156/73 | HR 69 | Temp 98.3°F | Resp 18 | Ht 66.0 in | Wt 151.4 lb

## 2015-09-14 DIAGNOSIS — I10 Essential (primary) hypertension: Secondary | ICD-10-CM

## 2015-09-14 DIAGNOSIS — Z794 Long term (current) use of insulin: Secondary | ICD-10-CM | POA: Diagnosis not present

## 2015-09-14 DIAGNOSIS — Z23 Encounter for immunization: Secondary | ICD-10-CM | POA: Diagnosis not present

## 2015-09-14 DIAGNOSIS — M545 Low back pain, unspecified: Secondary | ICD-10-CM

## 2015-09-14 DIAGNOSIS — E119 Type 2 diabetes mellitus without complications: Secondary | ICD-10-CM

## 2015-09-14 DIAGNOSIS — I700 Atherosclerosis of aorta: Secondary | ICD-10-CM | POA: Insufficient documentation

## 2015-09-14 DIAGNOSIS — E039 Hypothyroidism, unspecified: Secondary | ICD-10-CM

## 2015-09-14 DIAGNOSIS — M5136 Other intervertebral disc degeneration, lumbar region: Secondary | ICD-10-CM | POA: Insufficient documentation

## 2015-09-14 DIAGNOSIS — R0989 Other specified symptoms and signs involving the circulatory and respiratory systems: Secondary | ICD-10-CM

## 2015-09-14 DIAGNOSIS — M47816 Spondylosis without myelopathy or radiculopathy, lumbar region: Secondary | ICD-10-CM

## 2015-09-14 DIAGNOSIS — E118 Type 2 diabetes mellitus with unspecified complications: Secondary | ICD-10-CM | POA: Diagnosis not present

## 2015-09-14 MED ORDER — INSULIN GLARGINE 100 UNIT/ML SOLOSTAR PEN: 14.0000 [IU] | PEN_INJECTOR | Freq: Every day | SUBCUTANEOUS | 1 refills | Status: DC

## 2015-09-14 NOTE — Progress Notes (Signed)
Subjective:    Patient ID: Jessica Meyer, female    DOB: 01/30/1935, 80 y.o.   MRN: [id number]  HPI  Jessica Meyer is a 80 yr old female who presents today for follow up of multiple medical problems. She is accompanied by her daughter.   1) DM2- maintained on lantus, 14 units once daily. Reports some lows 55-75, often occurring in the middle of the night. Reports that this occurs 7-8 times a month. Daughter reports that patient does not always incorporate a lot of protein in her diet.  Lab Results  Component Value Date   HGBA1C 7.5 (H) 05/11/2015   HGBA1C 7.5 (H) 01/19/2015   HGBA1C 7.6 (H) 07/29/2014   Lab Results  Component Value Date   MICROALBUR <0.7 05/11/2015   LDLCALC 71 05/11/2015   CREATININE 1.37 (H) 05/11/2015   2)Anxiety/Depression-  She is maintained on wellbutrin. She is also maintained on hydroxyzine for anxiety. Reports that mood and anxiety remain well controlled.   3) HTN-  Maintained on amlodipine, coreg, lisinopril.  BP Readings from Last 3 Encounters:  09/14/15 (!) 156/73  05/26/15 (!) 152/75  05/11/15 (!) 144/70   4) Hyperlipidemia- on lipitor 10mg .  Lab Results  Component Value Date   CHOL 150 05/11/2015   HDL 43.80 05/11/2015   LDLCALC 71 05/11/2015   TRIG 174.0 (H) 05/11/2015   CHOLHDL 3 05/11/2015   5) Hypothyroid- maintained on synthroid 50mcg.  Lab Results  Component Value Date   TSH 4.39 05/11/2015   6) Back pain- reports low back pain. Pain has been present x 2 weeks. Reports that the pain stays in her back, non-radiating.  Has tried tylenol, motrin, heating pad.    Review of Systems    see HPI  Past Medical History:  Diagnosis Date  . Arthritis   . Diabetes mellitus without complication (HCC)   . History of chicken pox   . Hyperlipidemia   . Hypertension   . Thyroid disease    hypothyroid     Social History   Social History  . Marital status: Single    Spouse name: N/A  . Number of children: N/A  . Years of education:  N/A   Occupational History  . Not on file.   Social History Main Topics  . Smoking status: Never Smoker  . Smokeless tobacco: Current User    Types: Snuff  . Alcohol use 0.6 oz/week    1 Standard drinks or equivalent per week     Comment: occasional beer  . Drug use: No  . Sexual activity: Not on file   Other Topics Concern  . Not on file   Social History Narrative   Retired homemaker   Widow   Completed 7th grade   2 daughter   Shari HeritageSon and daughter. Daughter local, son in [place]   4 grandchildren   Enjoys puzzles, gardening.       Past Surgical History:  Procedure Laterality Date  . ABDOMINAL HYSTERECTOMY    . KNEE ARTHROSCOPY Bilateral   . REPLACEMENT TOTAL KNEE BILATERAL     1998 left , 1992 right    Family History  Problem Relation Age of Onset  . Arthritis Mother   . Depression Mother   . Heart attack Father     2957   . Hypertension Father     No Known Allergies  Current Outpatient Prescriptions on File Prior to Visit  Medication Sig Dispense Refill  . amLODipine (NORVASC) 5 MG tablet TAKE ONE  TABLET BY MOUTH DAILY 90 tablet 1  . atorvastatin (LIPITOR) 10 MG tablet Take 1 tablet (10 mg total) by mouth at bedtime. 90 tablet 1  . B-D UF III MINI PEN NEEDLES 31G X 5 MM MISC Use daily with Lantus Solostar pens 100 each 1  . buPROPion (WELLBUTRIN) 100 MG tablet TAKE 1 TABLET  BY MOUTH 2 TIMES DAILY. 180 tablet 1  . carvedilol (COREG) 12.5 MG tablet Take 1 tablet (12.5 mg total) by mouth 2 (two) times daily with a meal. 60 tablet 3  . diclofenac sodium (VOLTAREN) 1 % GEL Apply 4 g topically 4 (four) times daily as needed. 3 Tube 2  . glucose blood (ONE TOUCH ULTRA TEST) test strip Use as instructed to check blood sugar twice a day. 200 each 1  . ibuprofen (ADVIL,MOTRIN) 600 MG tablet Take 1 tablet (600 mg total) by mouth every 6 (six) hours as needed. 30 tablet 0  . levothyroxine (SYNTHROID, LEVOTHROID) 50 MCG tablet Take 1 tablet (50 mcg total) by mouth daily. 30  tablet 3  . lisinopril (PRINIVIL,ZESTRIL) 40 MG tablet Take 1 tablet (40 mg total) by mouth daily. 90 tablet 1   No current facility-administered medications on file prior to visit.     BP (!) 156/73 (BP Location: Right Arm, Cuff Size: Normal)   Pulse 69   Temp 98.3 F (36.8 C) (Oral)   Resp 18   Ht 5\' 6"  (1.676 m)   Wt 151 lb 6.4 oz (68.7 kg)   SpO2 99% Comment: room air  BMI 24.44 kg/m    Objective:   Physical Exam  Constitutional: She is oriented to person, place, and time. She appears well-developed and well-nourished.  Cardiovascular: Normal rate, regular rhythm and normal heart sounds.   No murmur heard. Pulmonary/Chest: Effort normal and breath sounds normal. No respiratory distress. She has no wheezes.  Musculoskeletal: She exhibits no edema.       Cervical back: She exhibits no tenderness.       Thoracic back: She exhibits no tenderness.       Lumbar back: She exhibits no tenderness.  Neurological: She is alert and oriented to person, place, and time.  Psychiatric: She has a normal mood and affect. Her behavior is normal. Judgment and thought content normal.          Assessment & Plan:  Abnormal pedal pulses- noted during DM foot exam.  Will obtain ABI to rule PVD.

## 2015-09-14 NOTE — Patient Instructions (Addendum)
Complete lab work prior to leaving.  Please check your sugar 1-2 times daily and send me your readings in 1 week via mychart. You may also send me some home blood pressure readings for comparison.  Stop glipizide. Continue lantus at 14 units. Stop ibuprofen/motrin. For back pain you may use tylenol 1000mg  every 8 hours as needed.  Complete x ray on the first floor.  Let me know if back pain worsens or if it does not improve in the next 2-3 weeks.

## 2015-09-15 ENCOUNTER — Encounter: Payer: Self-pay | Admitting: Family

## 2015-09-15 ENCOUNTER — Other Ambulatory Visit: Payer: Self-pay | Admitting: Family

## 2015-09-15 LAB — BASIC METABOLIC PANEL
BUN: 27 mg/dL — ABNORMAL HIGH (ref 6–23)
CHLORIDE: 105 meq/L (ref 96–112)
CO2: 31 mEq/L (ref 19–32)
Calcium: 9.7 mg/dL (ref 8.4–10.5)
Creatinine, Ser: 1.29 mg/dL — ABNORMAL HIGH (ref 0.40–1.20)
GFR: 42.51 mL/min — ABNORMAL LOW (ref >60.00)
Glucose, Bld: 68 mg/dL — ABNORMAL LOW (ref 70–99)
POTASSIUM: 4.1 meq/L (ref 3.5–5.1)
SODIUM: 141 meq/L (ref 135–145)

## 2015-09-15 LAB — TSH: TSH: 5.81 u[IU]/mL — ABNORMAL HIGH (ref 0.35–4.50)

## 2015-09-15 LAB — HEMOGLOBIN A1C: HEMOGLOBIN A1C: 7.5 % — AB (ref 4.6–6.5)

## 2015-09-15 NOTE — Assessment & Plan Note (Signed)
X-ray is performed and notes:  Multilevel moderate degenerative disc disease. No compression fracture or spondylolisthesis. Mild dextroscoliosis, stable.  Advised pt to use ES tylenol Q8hrs prn.  If pain worsens or does not improve we can consider referral to neurosurgery for further evaluation. She has had ESI in the remote past.  We did discuss possibility of adding tramadol prn.  I am concerned about increased fall risk with this since she lives alone, so will hold off for now.

## 2015-09-15 NOTE — Telephone Encounter (Signed)
Jessica Meyer-- please advise if ok to give additional refills for the hydroxyzine pt is taking for anxiety?

## 2015-09-15 NOTE — Assessment & Plan Note (Signed)
SBP mildly elevated. Daughter will check her BP at home and send us her readings via mychart. Continue current doses of amlodipine, coreg, lisinopril.

## 2015-09-15 NOTE — Assessment & Plan Note (Signed)
a1C is acceptable for her age. I am concerned about the frequency of her hypoglycemic events. I have asked her to stop her sulfonylurea and make sure to incorporate protein into each of her meals. I have asked her to check sugars 1-2 times daily and send me her readings via mychart. She is to continue her lantus and we can adjust as needed after we review her symptoms.

## 2015-09-16 ENCOUNTER — Telehealth: Payer: Self-pay | Admitting: Family

## 2015-09-16 ENCOUNTER — Encounter: Payer: Self-pay | Admitting: Family

## 2015-09-16 MED ORDER — LEVOTHYROXINE SODIUM 125 MCG PO TABS: 62.5000 ug | ORAL_TABLET | Freq: Every day | ORAL | 2 refills | Status: DC

## 2015-09-16 NOTE — Telephone Encounter (Signed)
Kidney function is stable.  Thyroid medication needs to be increased slightly. I would like her to stop synthroid 50 mcg and instead begin half of a tablet once daily.  Repeat tsh in 6 weeks.  Sugar control appears stable.

## 2015-09-16 NOTE — Telephone Encounter (Addendum)
Spoke with patient, patient states that she understands lab results, medication instructions, and follow up appointment with lab. Patient will call back to make 6wk follow up appointment with lab.

## 2015-09-17 ENCOUNTER — Ambulatory Visit (HOSPITAL_COMMUNITY)
Admission: RE | Admit: 2015-09-17 | Discharge: 2015-09-17 | Disposition: A | Discharge status: Home / Self Care | Payer: Medicare Other | Source: Ambulatory Visit | Attending: Family | Admitting: Family

## 2015-09-17 DIAGNOSIS — R0989 Other specified symptoms and signs involving the circulatory and respiratory systems: Secondary | ICD-10-CM | POA: Insufficient documentation

## 2015-09-17 DIAGNOSIS — I779 Disorder of arteries and arterioles, unspecified: Secondary | ICD-10-CM | POA: Insufficient documentation

## 2015-09-17 NOTE — Progress Notes (Signed)
VASCULAR LAB PRELIMINARY  ARTERIAL  ABI completed: Right sided ABI of 0.97 is indicative of normal flow at rest. Left sided ABI of 0.76 is indicative of moderate arterial occlusive disease at rest.     RIGHT    LEFT    PRESSURE WAVEFORM  PRESSURE WAVEFORM  BRACHIAL 143 Triphasic BRACHIAL 138 Triphasic  DP 139 Biphasic DP 108 Triphasic  PT 136 Biphasic PT 97 Biphasic    RIGHT LEFT  ABI 0.97 0.76     Jessica Meyer, RVT 09/17/2015, 10:47 AM

## 2015-09-19 ENCOUNTER — Telehealth: Payer: Self-pay | Admitting: Family

## 2015-09-19 DIAGNOSIS — I739 Peripheral vascular disease, unspecified: Secondary | ICD-10-CM

## 2015-09-19 NOTE — Telephone Encounter (Signed)
Please contact patient and let her know that I reviewed her ABI and it shows some diminished blood flow in the left leg.  I would like her to meet with vascular doctor for consultation.

## 2015-09-22 NOTE — Telephone Encounter (Signed)
Notified pt and she requests that I also notify her daughter. Left detailed message on daughter's voicemail and to call if any questions.

## 2015-10-04 ENCOUNTER — Encounter: Payer: Self-pay | Admitting: Family

## 2015-10-04 DIAGNOSIS — M545 Low back pain: Principal | ICD-10-CM

## 2015-10-04 DIAGNOSIS — G8929 Other chronic pain: Secondary | ICD-10-CM

## 2015-10-13 ENCOUNTER — Other Ambulatory Visit: Payer: Self-pay

## 2015-10-13 DIAGNOSIS — I7092 Chronic total occlusion of artery of the extremities: Secondary | ICD-10-CM

## 2015-10-29 DIAGNOSIS — M545 Low back pain: Secondary | ICD-10-CM | POA: Diagnosis not present

## 2015-10-29 DIAGNOSIS — M5136 Other intervertebral disc degeneration, lumbar region: Secondary | ICD-10-CM | POA: Diagnosis not present

## 2015-10-31 ENCOUNTER — Other Ambulatory Visit: Payer: Self-pay | Admitting: Family

## 2015-11-02 ENCOUNTER — Other Ambulatory Visit: Payer: Self-pay | Admitting: Family

## 2015-11-02 NOTE — Telephone Encounter (Signed)
Refills sent for bupropion. Pt last seen 09/14/15 and [facility] be [facility] Please call pt to schedule appt. Thanks!

## 2015-11-02 NOTE — Telephone Encounter (Signed)
Called pt. lvm informing pt to schedule an appt.

## 2015-11-06 ENCOUNTER — Encounter: Payer: Self-pay | Admitting: Vascular Surgery

## 2015-11-07 ENCOUNTER — Emergency Department (HOSPITAL_BASED_OUTPATIENT_CLINIC_OR_DEPARTMENT_OTHER)
Admission: EM | Admit: 2015-11-07 | Discharge: 2015-11-07 | Disposition: A | Discharge status: Home / Self Care | Payer: Medicare Other | Attending: Emergency Medicine | Admitting: Emergency Medicine

## 2015-11-07 ENCOUNTER — Emergency Department (HOSPITAL_BASED_OUTPATIENT_CLINIC_OR_DEPARTMENT_OTHER): Payer: Medicare Other

## 2015-11-07 ENCOUNTER — Encounter (HOSPITAL_BASED_OUTPATIENT_CLINIC_OR_DEPARTMENT_OTHER): Payer: Self-pay | Admitting: Emergency Medicine

## 2015-11-07 DIAGNOSIS — Z79899 Other long term (current) drug therapy: Secondary | ICD-10-CM | POA: Diagnosis not present

## 2015-11-07 DIAGNOSIS — E039 Hypothyroidism, unspecified: Secondary | ICD-10-CM | POA: Diagnosis not present

## 2015-11-07 DIAGNOSIS — R451 Restlessness and agitation: Secondary | ICD-10-CM | POA: Diagnosis not present

## 2015-11-07 DIAGNOSIS — R7309 Other abnormal glucose: Secondary | ICD-10-CM | POA: Diagnosis not present

## 2015-11-07 DIAGNOSIS — J9811 Atelectasis: Secondary | ICD-10-CM | POA: Diagnosis not present

## 2015-11-07 DIAGNOSIS — Z7982 Long term (current) use of aspirin: Secondary | ICD-10-CM | POA: Diagnosis not present

## 2015-11-07 DIAGNOSIS — R4182 Altered mental status, unspecified: Secondary | ICD-10-CM | POA: Insufficient documentation

## 2015-11-07 DIAGNOSIS — E11649 Type 2 diabetes mellitus with hypoglycemia without coma: Secondary | ICD-10-CM | POA: Diagnosis not present

## 2015-11-07 DIAGNOSIS — I10 Essential (primary) hypertension: Secondary | ICD-10-CM | POA: Insufficient documentation

## 2015-11-07 DIAGNOSIS — F17290 Nicotine dependence, other tobacco product, uncomplicated: Secondary | ICD-10-CM | POA: Diagnosis not present

## 2015-11-07 DIAGNOSIS — Z794 Long term (current) use of insulin: Secondary | ICD-10-CM | POA: Insufficient documentation

## 2015-11-07 DIAGNOSIS — E161 Other hypoglycemia: Secondary | ICD-10-CM | POA: Diagnosis not present

## 2015-11-07 DIAGNOSIS — E162 Hypoglycemia, unspecified: Secondary | ICD-10-CM

## 2015-11-07 LAB — COMPREHENSIVE METABOLIC PANEL
ALT: 13 U/L — ABNORMAL LOW (ref 14–54)
ANION GAP: 9 (ref 5–15)
AST: 19 U/L (ref 15–41)
Albumin: 4.6 g/dL (ref 3.5–5.0)
Alkaline Phosphatase: 61 U/L (ref 38–126)
BUN: 32 mg/dL — ABNORMAL HIGH (ref 6–20)
CHLORIDE: 107 mmol/L (ref 101–111)
CO2: 24 mmol/L (ref 22–32)
Calcium: 10.1 mg/dL (ref 8.9–10.3)
Creatinine, Ser: 1.51 mg/dL — ABNORMAL HIGH (ref 0.44–1.00)
GFR, EST AFRICAN AMERICAN: 37 mL/min — AB (ref >60)
GFR, EST NON AFRICAN AMERICAN: 32 mL/min — AB (ref >60)
Glucose, Bld: 81 mg/dL (ref 65–99)
Potassium: 4 mmol/L (ref 3.5–5.1)
SODIUM: 140 mmol/L (ref 135–145)
Total Bilirubin: 0.7 mg/dL (ref 0.3–1.2)
Total Protein: 7.4 g/dL (ref 6.5–8.1)

## 2015-11-07 LAB — URINALYSIS, ROUTINE W REFLEX MICROSCOPIC
BILIRUBIN URINE: NEGATIVE
Glucose, UA: 500 mg/dL — AB
Hgb urine dipstick: NEGATIVE
Ketones, ur: 15 mg/dL — AB
NITRITE: NEGATIVE
PROTEIN: NEGATIVE mg/dL
SPECIFIC GRAVITY, URINE: 1.017 (ref 1.005–1.030)
pH: 6.5 (ref 5.0–8.0)

## 2015-11-07 LAB — CBG MONITORING, ED
GLUCOSE-CAPILLARY: 133 mg/dL — AB (ref 65–99)
GLUCOSE-CAPILLARY: 89 mg/dL (ref 65–99)
GLUCOSE-CAPILLARY: 302 mg/dL — AB (ref 65–99)
Glucose-Capillary: 288 mg/dL — ABNORMAL HIGH (ref 65–99)
Glucose-Capillary: 247 mg/dL — ABNORMAL HIGH (ref 65–99)

## 2015-11-07 LAB — CBC
HEMATOCRIT: 40.4 % (ref 36.0–46.0)
Hemoglobin: 13.6 g/dL (ref 12.0–15.0)
MCH: 30.9 pg (ref 26.0–34.0)
MCHC: 33.7 g/dL (ref 30.0–36.0)
MCV: 91.8 fL (ref 78.0–100.0)
PLATELETS: 215 10*3/uL (ref 150–400)
RBC: 4.4 MIL/uL (ref 3.87–5.11)
RDW: 12.6 % (ref 11.5–15.5)
WBC: 16.8 10*3/uL — AB (ref 4.0–10.5)

## 2015-11-07 LAB — URINE MICROSCOPIC-ADD ON

## 2015-11-07 LAB — I-STAT CG4 LACTIC ACID, ED: LACTIC ACID, VENOUS: 1.36 mmol/L (ref 0.5–1.9)

## 2015-11-07 MED ORDER — DEXTROSE 10 % IV SOLN
INTRAVENOUS | Status: DC
  Administered 2015-11-07: 15:00:00 via INTRAVENOUS

## 2015-11-07 NOTE — ED Notes (Signed)
Family at bedside and very talkative wit family members

## 2015-11-07 NOTE — ED Notes (Signed)
ED Provider at bedside. 

## 2015-11-07 NOTE — ED Provider Notes (Signed)
Hand-off from Wal-MartJosh Geiple, [place]-C.   See initial provider's note for full HPI.   Briefly patient is a 80 year old female with history of insulin-dependent diabetes on Lantus who is found by family member unresponsive in hypoglycemia around 1:30pm. Patient reports he only been a small breakfast this morning and notes she took her normal insulin at noon. EMS report patient CBG was in the 20s upon their arrival, glucose was administered. Patient remains confused and slightly agitated upon arrival to the ED. Family reports they found the patient slouched next to the chair and there is no evidence of fall, injury or head trauma. Family members deny patient having any other recent symptoms.  Physical Exam  BP 132/84 (BP Location: Left Arm)   Pulse 86   Temp 98.4 F (36.9 C) (Oral)   Resp 18   Ht 5\' 8"  (1.727 m)   Wt 68.5 kg   SpO2 97%   BMI 22.96 kg/m   Physical Exam  Constitutional: She is oriented to person, place, and time. She appears well-developed and well-nourished. No distress.  HENT:  Head: Normocephalic and atraumatic. Head is without raccoon's eyes, without Battle's sign, without abrasion and without laceration.  Right Ear: Tympanic membrane normal. No hemotympanum.  Left Ear: Tympanic membrane normal. No hemotympanum.  Nose: Nose normal. No sinus tenderness, nasal deformity, septal deviation or nasal septal hematoma. No epistaxis.  Mouth/Throat: Uvula is midline, oropharynx is clear and moist and mucous membranes are normal. No oropharyngeal exudate, posterior oropharyngeal edema, posterior oropharyngeal erythema or tonsillar abscesses.  Eyes: Conjunctivae and EOM are normal. Pupils are equal, round, and reactive to light. Right eye exhibits no discharge. Left eye exhibits no discharge. No scleral icterus.  Neck: Normal range of motion. Neck supple.  Cardiovascular: Normal rate, regular rhythm, normal heart sounds and intact distal pulses.   Pulmonary/Chest: Effort normal and breath  sounds normal. No respiratory distress. She has no wheezes. She has no rales. She exhibits no tenderness.  Abdominal: Soft. Bowel sounds are normal. She exhibits no distension and no mass. There is no tenderness. There is no rebound and no guarding. No hernia.  Musculoskeletal: Normal range of motion. She exhibits no edema, tenderness or deformity.  No cervical, thoracic, or lumbar spine midline TTP. Full ROM of bilateral upper and lower extremities with 5/5 strength.   2+ radial and PT pulses. Sensation grossly intact.   Neurological: She is alert and oriented to person, place, and time. She has normal strength. No cranial nerve deficit or sensory deficit. Coordination and gait normal.  Skin: Skin is warm and dry. Capillary refill takes less than 2 seconds. No rash noted. She is not diaphoretic.  Nursing note and vitals reviewed.   ED Course  Procedures  MDM Patient presents with altered mental status associated with episode of hypoglycemia after taking her Lantus this afternoon. EMS administered glucose prior to arrival. Repeat CBG in the ED 89. Patient started on D10 drip. Pt noted to be hypothermic, bear-hugger applied. WBC 16.8. She is also sick acid 1.36. Remaining labs and urine unremarkable. Suspect patient's leukocytosis is likely associated with episode of hypoglycemia. Pt's CBG in 200s, patient given warm food and D10 drip discontinued. On reevaluation patient is at her baseline mental status. Plan to continue to monitor CBG and reevaluate patient. Plan discharge patient home if glucose remains stable.  On my initial valuation patient is resting comfortably in bed and denies any pain or complaints at this time. Patient is tolerating fluids and food. Exam unremarkable.  No neuro deficits. Repeat vital showed temp increased 98.5, bear hugger was removed. Repeat CBG shows stabilization of glucose remaining in 200s. Patient tolerating by mouth. Temp remains stable at 98.4. Discussed results  and plan for discharge with patient. Advised patient to continue monitoring her glucose levels at home and continue taking her home medications as prescribed. Advised patient to follow up with her PCP within the next week for reevaluation. Discussed return precautions.       Satira Sarkicole Elizabeth GreenvilleNadeau, [place] 11/08/15 [id number]    Alvira MondayErin Schlossman, MD 11/08/15 1319

## 2015-11-07 NOTE — ED Notes (Signed)
Family at bedside. 

## 2015-11-07 NOTE — ED Provider Notes (Signed)
[facility]   CSN: [id number] Arrival date & time: 11/07/15  1432     History   Chief Complaint Chief Complaint  Patient presents with  . Hypoglycemia    HPI Jessica Meyer is a 80 y.o. female.  Patient with history of insulin-dependent diabetes on Lantus found unresponsive and hypoglycemic today by family at approximately 1:30 PM. Patient states that she only had a small breakfast today consisting of 2 pieces of toast. She took her insulin as normal at approximately noon. Family talked to patient around 12:10 PM and she was normal. Family arrived at the patient's house at 1:30pm and found her unresponsive and diaphoretic. EMS was called and found patient's blood sugar to be in the 20s. They administered glucose which improved mentation. Upon arrival to the emergency department, patient confused and slightly agitated. Patient does not remember what happened. She was found with some candy and appears as though she was trying to treat her low blood sugar. No neck pain, headache, or signs of head trauma. Otherwise, no recent symptoms of infection including, URI symptoms, fever, or cough. No vomiting or diarrhea. No recent urinary tract infections. Patient was discontinued off of oral anti-hyperglycemics approximately 2 months ago. The onset of this condition was acute. The course is improving. Aggravating factors: none. Alleviating factors: glucose.        Past Medical History:  Diagnosis Date  . Arthritis   . Diabetes mellitus without complication (HCC)   . History of chicken pox   . Hyperlipidemia   . Hypertension   . Thyroid disease    hypothyroid    Patient Active Problem List   Diagnosis Date Noted  . Left knee pain 05/28/2015  . Sprain of hand, third finger, right 05/28/2015  . HTN (hypertension) 07/29/2014  . Gait instability 07/29/2014  . Hyperlipidemia 07/29/2014  . Right knee pain 04/11/2014  . ASNHL (asymmetrical sensorineural hearing  loss) 08/07/2013  . Diabetes type 2, controlled (HCC) 08/07/2013  . 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 Hernia, rectovaginal 08/07/2013  . Hypercholesterolemia without hypertriglyceridemia 08/07/2013  . Arthritis of knee, degenerative 08/07/2013  . Degenerative arthritis of lumbar spine 08/07/2013  . Osteopenia 08/07/2013  . Adult hypothyroidism 08/07/2013  . History of artificial joint 08/07/2013  . Essential (primary) hypertension 08/07/2013  . Depression with anxiety 08/07/2013    Past Surgical History:  Procedure Laterality Date  . ABDOMINAL HYSTERECTOMY    . KNEE ARTHROSCOPY Bilateral   . REPLACEMENT TOTAL KNEE BILATERAL     1998 left , 1992 right    OB History    No data available       Home Medications    Prior to Admission medications   Medication Sig Start Date End Date Taking? Authorizing Provider  aspirin EC 81 MG tablet Take 81 mg by mouth daily.   Yes Historical Provider, MD  amLODipine (NORVASC) 5 MG tablet TAKE ONE TABLET BY MOUTH DAILY 07/27/15   Sandford CrazeMelissa O'Sullivan, NP  atorvastatin (LIPITOR) 10 MG tablet Take 1 tablet (10 mg total) by mouth at bedtime. 01/13/15   Sandford CrazeMelissa O'Sullivan, NP  B-D UF III MINI PEN NEEDLES 31G X 5 MM MISC Use daily with Lantus Solostar pens 05/27/15   Sandford CrazeMelissa O'Sullivan, NP  buPROPion (WELLBUTRIN) 100 MG tablet TAKE 1 TABLET  BY MOUTH 2 TIMES DAILY. 11/02/15   Sandford CrazeMelissa O'Sullivan, NP  carvedilol (COREG) 12.5 MG tablet TAKE ONE TABLET BY MOUTH TWICE A DAY WITH A MEAL 11/03/15   Sandford CrazeMelissa O'Sullivan,  NP  diclofenac sodium (VOLTAREN) 1 % GEL Apply 4 g topically 4 (four) times daily as needed. 05/26/15   Lenda KelpShane R Hudnall, MD  glucose blood (ONE TOUCH ULTRA TEST) test strip Use as instructed to check blood sugar twice a day. 01/27/15   Sandford CrazeMelissa O'Sullivan, NP  hydrOXYzine (ATARAX/VISTARIL) 25 MG tablet TAKE ONE TABLET BY MOUTH TWICE A DAY AS NEEDED FOR ANXIETY 09/15/15   Sandford CrazeMelissa O'Sullivan, NP  ibuprofen (ADVIL,MOTRIN) 600 MG tablet Take 1 tablet (600 mg  total) by mouth every 6 (six) hours as needed. 05/07/15   Mady GemmaElizabeth C Westfall, PA-C  Insulin Glargine (LANTUS SOLOSTAR) 100 UNIT/ML Solostar Pen Inject 14 Units into the skin daily at 10 pm. 09/14/15   Sandford CrazeMelissa O'Sullivan, NP  levothyroxine (SYNTHROID, LEVOTHROID) 125 MCG tablet Take 0.5 tablets (62.5 mcg total) by mouth daily before breakfast. 09/16/15   Sandford CrazeMelissa O'Sullivan, NP  lisinopril (PRINIVIL,ZESTRIL) 40 MG tablet Take 1 tablet (40 mg total) by mouth daily. 01/19/15   Sandford CrazeMelissa O'Sullivan, NP    Family History Family History  Problem Relation Age of Onset  . Arthritis Mother   . Depression Mother   . Heart attack Father     2457   . Hypertension Father     Social History Social History  Substance Use Topics  . Smoking status: Never Smoker  . Smokeless tobacco: Current User    Types: Snuff  . Alcohol use 0.6 oz/week    1 Standard drinks or equivalent per week     Comment: occasional beer     Allergies   Review of patient's allergies indicates no known allergies.   Review of Systems Review of Systems  Unable to perform ROS: Mental status change  Psychiatric/Behavioral: Positive for agitation and confusion.     Physical Exam Updated Vital Signs BP 148/70   Pulse 65   Temp (!) 92.5 F (33.6 C) (Rectal)   Resp (!) 27   Ht 5\' 8"  (1.727 m)   Wt 68.5 kg   SpO2 100%   BMI 22.96 kg/m   Physical Exam  Constitutional: She is oriented to person, place, and time. She appears well-developed and well-nourished.  HENT:  Head: Normocephalic and atraumatic. Head is without raccoon's eyes and without Battle's sign.  Right Ear: Tympanic membrane, external ear and ear canal normal. No hemotympanum.  Left Ear: Tympanic membrane, external ear and ear canal normal. No hemotympanum.  Nose: Nose normal. No nasal septal hematoma.  Mouth/Throat: Uvula is midline, oropharynx is clear and moist and mucous membranes are normal.  Eyes: Conjunctivae, EOM and lids are normal. Pupils are equal,  round, and reactive to light. Right eye exhibits no discharge. Left eye exhibits no discharge. Right eye exhibits no nystagmus. Left eye exhibits no nystagmus.  No visible hyphema noted  Neck: Normal range of motion. Neck supple.  Cardiovascular: Normal rate, regular rhythm and normal heart sounds.   No murmur heard. Pulmonary/Chest: Effort normal and breath sounds normal. No respiratory distress. She has no wheezes. She has no rales.  Abdominal: Soft. There is no tenderness.  Musculoskeletal:       Cervical back: She exhibits normal range of motion, no tenderness and no bony tenderness.       Thoracic back: She exhibits no tenderness and no bony tenderness.       Lumbar back: She exhibits no tenderness and no bony tenderness.  Neurological: She is alert and oriented to person, place, and time. She displays no tremor. No cranial nerve deficit or sensory  deficit. GCS eye subscore is 4. GCS verbal subscore is 5. GCS motor subscore is 6.  Patient somewhat agitated on arrival, but quickly improved with treatment.   Skin: Skin is warm and dry.  Psychiatric: She has a normal mood and affect.  Nursing note and vitals reviewed.    ED Treatments / Results  Labs (all labs ordered are listed, but only abnormal results are displayed) Labs Reviewed  CBC - Abnormal; Notable for the following:       Result Value   WBC 16.8 (*)    All other components within normal limits  COMPREHENSIVE METABOLIC PANEL - Abnormal; Notable for the following:    BUN 32 (*)    Creatinine, Ser 1.51 (*)    ALT 13 (*)    GFR calc non Af Amer 32 (*)    GFR calc Af Amer 37 (*)    All other components within normal limits  CBG MONITORING, ED - Abnormal; Notable for the following:    Glucose-Capillary 133 (*)    All other components within normal limits  CBG MONITORING, ED - Abnormal; Notable for the following:    Glucose-Capillary 288 (*)    All other components within normal limits  URINALYSIS, ROUTINE W REFLEX  MICROSCOPIC (NOT AT [facility])  CBG MONITORING, ED  I-STAT CG4 LACTIC ACID, ED    Radiology Dg Chest Port 1 View  Result Date: 11/07/2015 CLINICAL DATA:  Hypoglycemia and confusion EXAM: PORTABLE CHEST 1 VIEW COMPARISON:  None. FINDINGS: The heart size and mediastinal contours are within normal limits. Both lungs are well aerated with minimal left basilar atelectasis. No bony abnormality is noted. IMPRESSION: Minimal left basilar atelectasis. Electronically Signed   By: Alcide Clever M.D.   On: 11/07/2015 15:28    Procedures Procedures (including critical care time)  Medications Ordered in ED Medications  dextrose 10 % infusion ( Intravenous New Bag/Given 11/07/15 1506)     Initial Impression / Assessment and Plan / ED Course  I have reviewed the triage vital signs and the nursing notes.  Pertinent labs & imaging results that were available during my care of the patient were reviewed by me and considered in my medical decision making (see chart for details).  Clinical Course   Patient seen and examined. Pt discussed with and seen by Dr. Dalene Seltzer. Work-up initiated. Will closely monitor CBG.   Vital signs reviewed and are as follows: BP 138/68   Pulse 69   Temp (!) 92.5 F (33.6 C) (Rectal)   Resp 12   Ht 5\' 8"  (1.727 m)   Wt 68.5 kg   SpO2 99%   BMI 22.96 kg/m   4:47 PM Handoff to Nadeau PA-C at shift change.   Patient's blood sugar into 200's. Will give something warm to eat and D10 drip discontinued. Patient and family updated on results. UA is pending. Patient is at her baseline mentation.     Final Clinical Impressions(s) / ED Diagnoses   Final diagnoses:  Hypoglycemia  Hypothermia, initial encounter    New Prescriptions New Prescriptions   No medications on file     Renne Crigler, PA-C 11/07/15 1650    Alvira Monday, MD 11/08/15 1316

## 2015-11-07 NOTE — ED Triage Notes (Signed)
Pt to ED via EMS for hypoglycemia; pt confused upon arrival; received "12.5mg  of D10" after initial glucose of 25; glucose up to 148 after that; 84 on arrival to ED.

## 2015-11-07 NOTE — Discharge Instructions (Signed)
Continue taking your home medications including her insulin as prescribed. I recommend continuing to monitor her glucose throughout the day and prior to taking your insulin. Follow-up with your primary care provider within the next week for follow-up evaluation. Please return to the Emergency Department if symptoms worsen or new onset of fever, headache, confusion, lightheadedness/dizziness, chest pain, difficulty breathing, abdominal pain, vomiting, diarrhea, urinary symptoms, weakness, altered mental status.

## 2015-11-07 NOTE — ED Notes (Signed)
CBG on arrival was [id number] RN Shary KeySteve Mabe notified when completed

## 2015-11-07 NOTE — ED Notes (Signed)
See triage note. Pt's daughter reports speaking with pt around 1210 and states her mother was normal at that time; reports they found pt unconscious at home around 1330. Reports pt was diaphoretic when found. EMS called and pt has been confused since that time.

## 2015-11-07 NOTE — ED Notes (Signed)
PA-C updated on pt's vitals and that [facility].

## 2015-11-08 ENCOUNTER — Telehealth: Payer: Self-pay | Admitting: Family

## 2015-11-08 MED ORDER — CIPROFLOXACIN HCL 250 MG PO TABS: 250.0000 mg | ORAL_TABLET | Freq: Two times a day (BID) | ORAL | 0 refills | Status: DC

## 2015-11-08 NOTE — Telephone Encounter (Signed)
Reviewed ER visit re: hypoglycemia. Spoke with daughter. She states that patient is feeling better today. Sugar was 118 this AM and was 279 after lunch.  Reviewed lab work.  Trace leuks notes on UA.  Will begin empiric cipro to cover for UTI.  Advised daughter to continue to monitor pt's sugars and continue current dose of lantus for now. Daughter verbalizes understanding.   Shiquita, Could you please contact pt's daughter Eunice BlaseDebbie to arrange OV with me later this week? Ok to put in a 15 minute slot.

## 2015-11-09 LAB — CBG MONITORING, ED
COMMENT 1: 13333513
GLUCOSE-CAPILLARY: 84 mg/dL (ref 65–99)

## 2015-11-09 NOTE — Telephone Encounter (Signed)
Spoke with pt's daughter and scheduled f/u for 11/11/15 at 1pm.

## 2015-11-10 ENCOUNTER — Encounter: Payer: Self-pay | Admitting: Vascular Surgery

## 2015-11-10 ENCOUNTER — Ambulatory Visit (INDEPENDENT_AMBULATORY_CARE_PROVIDER_SITE_OTHER): Payer: Medicare Other | Admitting: Vascular Surgery

## 2015-11-10 ENCOUNTER — Ambulatory Visit (HOSPITAL_COMMUNITY)
Admission: RE | Admit: 2015-11-10 | Discharge: 2015-11-10 | Disposition: A | Discharge status: Home / Self Care | Payer: Medicare Other | Source: Ambulatory Visit | Attending: Vascular Surgery | Admitting: Vascular Surgery

## 2015-11-10 VITALS — BP 139/73 | HR 72 | Temp 98.6°F | Resp 16 | Ht 67.0 in | Wt 147.0 lb

## 2015-11-10 DIAGNOSIS — I7092 Chronic total occlusion of artery of the extremities: Secondary | ICD-10-CM | POA: Diagnosis not present

## 2015-11-10 DIAGNOSIS — R0989 Other specified symptoms and signs involving the circulatory and respiratory systems: Secondary | ICD-10-CM | POA: Diagnosis not present

## 2015-11-10 LAB — VAS US LOWER EXTREMITY ARTERIAL DUPLEX
RATIBDISTSYS: 57 cm/s
RPERPSV: 37 cm/s
RSFDPSV: -110 cm/s
RTIBDISTSYS: -20 cm/s
Right popliteal dist sys PSV: 86 cm/s
Right popliteal prox sys PSV: 178 cm/s
Right super femoral mid sys PSV: -106 cm/s
Right super femoral prox sys PSV: 151 cm/s

## 2015-11-10 NOTE — Progress Notes (Signed)
Vitals:   11/10/15 1100  BP: (!) 142/87  Pulse: 72  Resp: 16  Temp: 98.6 F (37 C)  SpO2: 98%  Weight: 147 lb (66.7 kg)  Height: 5\' 7"  (1.702 m)

## 2015-11-10 NOTE — Progress Notes (Signed)
[facility] [place]  Patient name: Donnel SaxonDoris Ann Marich MRN: [id number] DOB: 12/31/1935 Sex: female  REASON FOR CONSULT: Lower extremity discomfort with diminished pulses  HPI: Donnel SaxonDoris Ann Dye is a 80 y.o. female, who is seen today for discussion of symptoms in her lower extremities and the diminished pulse in her left leg. She has a very pleasant 80 year old female presenting with discomfort in both lower extremities. He reports that she is bilateral. She reports that the left leg may be somewhat worse in the right. She reports a tingling discomfort in both lower extremities throughout her posterior thigh into her lower legs. She reports this can bother her while lying flat and she reports that she walks to make this somewhat better. She does have a remote history of bilateral knee replacement as well. She does not describe any true claudication symptoms. Has had no lower extremity tissue loss.  Past Medical History:  Diagnosis Date  . Arthritis   . Diabetes mellitus without complication (HCC)   . History of chicken pox   . Hyperlipidemia   . Hypertension   . Thyroid disease    hypothyroid    Family History  Problem Relation Age of Onset  . Arthritis Mother   . Depression Mother   . Heart attack Father     [id number]   . Hypertension Father     SOCIAL HISTORY: Social History   Social History  . Marital status: Single    Spouse name: N/A  . Number of children: N/A  . Years of education: N/A   Occupational History  . Not on file.   Social History Main Topics  . Smoking status: Never Smoker  . Smokeless tobacco: Current User    Types: Snuff  . Alcohol use 0.6 oz/week    1 Standard drinks or equivalent per week     Comment: occasional beer  . Drug use: No  . Sexual activity: Not on file   Other Topics Concern  . Not on file   Social History Narrative   Retired homemaker   Widow   Completed 7th grade   2 daughter   Shari HeritageSon and daughter. Daughter local, son in [place]   4 grandchildren   Enjoys puzzles, gardening.       No Known Allergies  Current Outpatient Prescriptions  Medication Sig Dispense Refill  . amLODipine (NORVASC) 5 MG tablet TAKE ONE TABLET BY MOUTH DAILY 90 tablet 1  . aspirin EC 81 MG tablet Take 81 mg by mouth daily.    Marland Kitchen. atorvastatin (LIPITOR) 10 MG tablet Take 1 tablet (10 mg total) by mouth at bedtime. 90 tablet 1  . B-D UF III MINI PEN NEEDLES 31G X 5 MM MISC Use daily with Lantus Solostar pens 100 each 1  . buPROPion (WELLBUTRIN) 100 MG tablet TAKE 1 TABLET  BY MOUTH 2 TIMES DAILY. 60 tablet 5  . carvedilol (COREG) 12.5 MG tablet TAKE ONE TABLET BY MOUTH TWICE A DAY WITH A MEAL 60 tablet 5  . ciprofloxacin (CIPRO) 250 MG tablet Take 1 tablet (250 mg total) by mouth 2 (two) times daily. 6 tablet 0  . diclofenac sodium (VOLTAREN) 1 % GEL Apply 4 g topically 4 (four) times daily as needed. 3 Tube 2  . glucose blood (ONE TOUCH ULTRA TEST) test strip Use as instructed to check blood sugar twice a day. 200 each 1  . hydrOXYzine (ATARAX/VISTARIL) 25 MG tablet TAKE ONE TABLET BY MOUTH TWICE A DAY AS  NEEDED FOR ANXIETY 60 tablet 5  . ibuprofen (ADVIL,MOTRIN) 600 MG tablet Take 1 tablet (600 mg total) by mouth every 6 (six) hours as needed. 30 tablet 0  . Insulin Glargine (LANTUS SOLOSTAR) 100 UNIT/ML Solostar Pen Inject 14 Units into the skin daily at 10 pm. 15 mL 1  . levothyroxine (SYNTHROID, LEVOTHROID) 125 MCG tablet Take 0.5 tablets (62.5 mcg total) by mouth daily before breakfast. 30 tablet 2  . lisinopril (PRINIVIL,ZESTRIL) 40 MG tablet Take 1 tablet (40 mg total) by mouth daily. 90 tablet 1   No current facility-administered medications for this visit.     REVIEW OF SYSTEMS:  [X]  denotes positive finding, [ ]  denotes negative finding Cardiac  Comments:  Chest pain or chest pressure:    Shortness of breath upon exertion:    Short of breath when lying flat:    Irregular heart rhythm:         Vascular    Pain in calf, thigh, or hip brought on by ambulation: x   Pain in feet at night that wakes you up from your sleep:     Blood clot in your veins:    Leg swelling:         Pulmonary    Oxygen at home:    Productive cough:     Wheezing:         Neurologic    Sudden weakness in arms or legs:     Sudden numbness in arms or legs:     Sudden onset of difficulty speaking or slurred speech:    Temporary loss of vision in one eye:     Problems with dizziness:         Gastrointestinal    Blood in stool:     Vomited blood:         Genitourinary    Burning when urinating:     Blood in urine:        Psychiatric    Major depression:         Hematologic    Bleeding problems:    Problems with blood clotting too easily:        Skin    Rashes or ulcers:        Constitutional    Fever or chills:      PHYSICAL EXAM: Vitals:   11/10/15 1100 11/10/15 1102  BP: (!) 142/87 139/73  Pulse: 72 72  Resp: 16   Temp: 98.6 F (37 C)   SpO2: 98%   Weight: 147 lb (66.7 kg)   Height: 5\' 7"  (1.702 m)     GENERAL: The patient is a well-nourished female, in no acute distress. The vital signs are documented above. CARDIOVASCULAR: Carotid arteries without bruits bilaterally. 2+ radial pulses bilaterally. 2+ femoral pulses bilaterally. 2+ right popliteal and 1+ left popliteal pulse. She does have a 2+ dorsalis pedis pulse in the right I do not palpate pedal pulses on the left PULMONARY: There is good air exchange  ABDOMEN: Soft and non-tender . No masses and no aneurysm palpable MUSCULOSKELETAL: There are no major deformities or cyanosis. NEUROLOGIC: No focal weakness or paresthesias are detected. SKIN: There are no ulcers or rashes noted. PSYCHIATRIC: The patient has a normal affect.  DATA:  [facility]. This revealed ankle arm index of 0.97 on the right and 0.76 on the left and biphasic and triphasic flow on the  left.  She underwent duplex imaging of her arteries  throughout the course on the left in our office today. This showed a high bifurcation of her left common femoral artery and tortuous left popliteal artery with no evidence of significant stenosis  MEDICAL ISSUES: I discussed this with the patient and her daughter present. I do not see any evidence of significant arterial insufficiency. Her symptoms do not correspond with this as well since she reports some relief with arising when she is noting this when lying flat. She will continue to monitor this. She will see [place] again on an as-needed basis   Larina Earthly, MD [facility] Tel [phone] Pager [phone]

## 2015-11-11 ENCOUNTER — Encounter: Payer: Self-pay | Admitting: Family

## 2015-11-11 ENCOUNTER — Ambulatory Visit (INDEPENDENT_AMBULATORY_CARE_PROVIDER_SITE_OTHER): Payer: Medicare Other | Admitting: Family

## 2015-11-11 VITALS — BP 110/70 | HR 96 | Temp 98.1°F | Resp 16 | Ht 68.0 in | Wt 149.4 lb

## 2015-11-11 DIAGNOSIS — H9209 Otalgia, unspecified ear: Secondary | ICD-10-CM

## 2015-11-11 DIAGNOSIS — I7092 Chronic total occlusion of artery of the extremities: Secondary | ICD-10-CM

## 2015-11-11 DIAGNOSIS — E162 Hypoglycemia, unspecified: Secondary | ICD-10-CM

## 2015-11-11 NOTE — Progress Notes (Signed)
Pre visit review using our clinic review tool, if applicable. No additional management support is needed unless otherwise documented below in the visit note. 

## 2015-11-11 NOTE — Progress Notes (Signed)
Subjective:    Patient ID: Jessica Meyer, female    DOB: 04/10/1935, 80 y.o.   MRN: [id number]  HPI  Jessica Meyer is a 80 yr old female who presents today for ED follow up for hypoglycemia.   She is brought today by her son in law. ED record is reviewed.  She was evaluated on 11/07/15 due to hypoglyemic event which occurred at home.  Apparently she had only eaten one piece of toast and had been very active at home.  She took her lantus at her regularly scheduled time and then had an episode of hypoglycemia. She was found unresponsive with a glucose in the 20's.  Since returning home she has been eating regular meals.  She is feeling well.  Her sugars are labile with some sugars in the high 200's but no sugars <80 since returning home.    Left ear pain- has been off/on for a while, worse this AM.  Has a deep popping noise. Denies fever. Denies dysuria/frequency.    Lab Results  Component Value Date   HGBA1C 7.5 (H) 09/14/2015     Review of Systems     Past Medical History:  Diagnosis Date  . Arthritis   . Diabetes mellitus without complication (HCC)   . History of chicken pox   . Hyperlipidemia   . Hypertension   . Thyroid disease    hypothyroid     Social History   Social History  . Marital status: Single    Spouse name: N/A  . Number of children: N/A  . Years of education: N/A   Occupational History  . Not on file.   Social History Main Topics  . Smoking status: Never Smoker  . Smokeless tobacco: Current User    Types: Snuff  . Alcohol use 0.6 oz/week    1 Standard drinks or equivalent per week     Comment: occasional beer  . Drug use: No  . Sexual activity: Not on file   Other Topics Concern  . Not on file   Social History Narrative   Retired homemaker   Widow   Completed 7th grade   2 daughter   Shari HeritageSon and daughter. Daughter local, son in [place]   4 grandchildren   Enjoys puzzles, gardening.       Past Surgical History:  Procedure Laterality Date  .  ABDOMINAL HYSTERECTOMY    . KNEE ARTHROSCOPY Bilateral   . REPLACEMENT TOTAL KNEE BILATERAL     1998 left , 1992 right    Family History  Problem Relation Age of Onset  . Arthritis Mother   . Depression Mother   . Heart attack Father     [id number]   . Hypertension Father     No Known Allergies  Current Outpatient Prescriptions on File Prior to Visit  Medication Sig Dispense Refill  . amLODipine (NORVASC) 5 MG tablet TAKE ONE TABLET BY MOUTH DAILY 90 tablet 1  . aspirin EC 81 MG tablet Take 81 mg by mouth daily.    Marland Kitchen. atorvastatin (LIPITOR) 10 MG tablet Take 1 tablet (10 mg total) by mouth at bedtime. 90 tablet 1  . B-D UF III MINI PEN NEEDLES 31G X 5 MM MISC Use daily with Lantus Solostar pens 100 each 1  . buPROPion (WELLBUTRIN) 100 MG tablet TAKE 1 TABLET  BY MOUTH 2 TIMES DAILY. 60 tablet 5  . carvedilol (COREG) 12.5 MG tablet TAKE ONE TABLET BY MOUTH TWICE A DAY WITH A MEAL 60  tablet 5  . ciprofloxacin (CIPRO) 250 MG tablet Take 1 tablet (250 mg total) by mouth 2 (two) times daily. 6 tablet 0  . diclofenac sodium (VOLTAREN) 1 % GEL Apply 4 g topically 4 (four) times daily as needed. 3 Tube 2  . glucose blood (ONE TOUCH ULTRA TEST) test strip Use as instructed to check blood sugar twice a day. 200 each 1  . hydrOXYzine (ATARAX/VISTARIL) 25 MG tablet TAKE ONE TABLET BY MOUTH TWICE A DAY AS NEEDED FOR ANXIETY 60 tablet 5  . ibuprofen (ADVIL,MOTRIN) 600 MG tablet Take 1 tablet (600 mg total) by mouth every 6 (six) hours as needed. 30 tablet 0  . Insulin Glargine (LANTUS SOLOSTAR) 100 UNIT/ML Solostar Pen Inject 14 Units into the skin daily at 10 pm. 15 mL 1  . levothyroxine (SYNTHROID, LEVOTHROID) 125 MCG tablet Take 0.5 tablets (62.5 mcg total) by mouth daily before breakfast. 30 tablet 2  . lisinopril (PRINIVIL,ZESTRIL) 40 MG tablet Take 1 tablet (40 mg total) by mouth daily. 90 tablet 1   No current facility-administered medications on file prior to visit.     BP 110/70 (BP Location:  Right Arm, Cuff Size: Normal)   Pulse 96   Temp 98.1 F (36.7 C) (Oral)   Resp 16   Ht 5\' 8"  (1.727 m)   Wt 149 lb 6.4 oz (67.8 kg)   SpO2 97% Comment: room air  BMI 22.72 kg/m    Objective:   Physical Exam  Constitutional: She appears well-developed and well-nourished.  HENT:  Right Ear: Tympanic membrane and ear canal normal.  Left Ear: Tympanic membrane normal.  Mouth/Throat: No oropharyngeal exudate, posterior oropharyngeal edema or posterior oropharyngeal erythema.  Cardiovascular: Normal rate, regular rhythm and normal heart sounds.   No murmur heard. Pulmonary/Chest: Effort normal and breath sounds normal. No respiratory distress. She has no wheezes.  Psychiatric: She has a normal mood and affect. Her behavior is normal. Judgment and thought content normal.          Assessment & Plan:  Hypoglycemia-   Lab Results  Component Value Date   HGBA1C 7.5 (H) 09/14/2015   A1C is acceptable for her age.  I think her low blood sugar was really related to poor PO intake that AM.  At this point, will not decrease her insulin but I have asked her to continue to monitor her sugar closely and let me know if she has any further hypoglycemia (sugars <80 or >300).  Otalgia- normal ear exam.  I have advised pt as follows:  Continue to monitor your sugars regularly. Call us if sugars <80 or >300. Continue your current dose of lantus. For year pain, you may use tylenol.  Let me know if pain worsens or if it does not improve- you do not appear to have a ear infection.

## 2015-11-11 NOTE — Patient Instructions (Addendum)
Continue to monitor your sugars regularly. Call us if sugars <80 or >300. Continue your current dose of lantus. For year pain, you may use tylenol.  Let me know if pain worsens or if it does not improve- you do not appear to have a ear infection.

## 2015-11-14 ENCOUNTER — Other Ambulatory Visit: Payer: Self-pay | Admitting: Family

## 2015-11-16 NOTE — Telephone Encounter (Signed)
Rx request to pharmacy/[facility]  

## 2015-11-18 ENCOUNTER — Ambulatory Visit: Payer: Medicare Other | Admitting: Family

## 2015-11-21 ENCOUNTER — Encounter: Payer: Self-pay | Admitting: Family

## 2015-11-24 MED ORDER — GLUCOSE BLOOD VI STRP: ORAL_STRIP | 1 refills | Status: DC

## 2015-11-24 MED ORDER — ACCU-CHEK AVIVA PLUS W/DEVICE KIT: PACK | 0 refills | Status: DC

## 2015-11-24 MED ORDER — ACCU-CHEK FASTCLIX LANCETS MISC: 3 refills | Status: DC

## 2015-12-02 MED ORDER — ACCU-CHEK SOFTCLIX LANCET DEV MISC: 5 refills | Status: DC

## 2015-12-02 NOTE — Telephone Encounter (Signed)
[facility] that pt needs softclix lancets to go with the meter she was given. New Rx sent.

## 2015-12-02 NOTE — Addendum Note (Signed)
Addended by: Mervin KungFERGERSON, Fotios Amos A on: 12/02/2015 05:42 PM   Modules accepted: Orders

## 2015-12-03 ENCOUNTER — Other Ambulatory Visit: Payer: Self-pay | Admitting: *Deleted

## 2015-12-03 MED ORDER — ACCU-CHEK SOFTCLIX LANCET DEV MISC: 5 refills | Status: DC

## 2015-12-08 ENCOUNTER — Other Ambulatory Visit: Payer: Self-pay | Admitting: Family

## 2015-12-16 ENCOUNTER — Other Ambulatory Visit: Payer: Self-pay | Admitting: Family

## 2015-12-30 ENCOUNTER — Other Ambulatory Visit: Payer: Self-pay | Admitting: Family

## 2016-01-11 ENCOUNTER — Ambulatory Visit (INDEPENDENT_AMBULATORY_CARE_PROVIDER_SITE_OTHER): Payer: Medicare Other | Admitting: Family

## 2016-01-11 ENCOUNTER — Encounter: Payer: Self-pay | Admitting: Family

## 2016-01-11 VITALS — BP 142/65 | HR 68 | Temp 98.3°F | Resp 16 | Ht 68.0 in | Wt 146.0 lb

## 2016-01-11 DIAGNOSIS — E039 Hypothyroidism, unspecified: Secondary | ICD-10-CM | POA: Diagnosis not present

## 2016-01-11 DIAGNOSIS — E119 Type 2 diabetes mellitus without complications: Secondary | ICD-10-CM

## 2016-01-11 NOTE — Progress Notes (Signed)
Pre visit review using our clinic review tool, if applicable. No additional management support is needed unless otherwise documented below in the visit note. 

## 2016-01-11 NOTE — Progress Notes (Signed)
Subjective:    Patient ID: Jessica Meyer, female    DOB: 03/24/1935, 81 y.o.   MRN: [id number]  HPI   Jessica Meyer is a 81 yr old female who presents today for follow up.  DM2- she reports no further hypoglycemia since her last visit. She is not skipping meals.   Lab Results  Component Value Date   HGBA1C 7.5 (H) 09/14/2015   Hypothyroid- last visit her synthroid dose was increased. She reports feeling well on current dose of synthroid.  Lab Results  Component Value Date   TSH 5.81 (H) 09/14/2015     Review of Systems    see HPI  Past Medical History:  Diagnosis Date  . Arthritis   . Diabetes mellitus without complication ([place])   . History of chicken pox   . Hyperlipidemia   . Hypertension   . Thyroid disease    hypothyroid     Social History   Social History  . Marital status: Single    Spouse name: N/A  . Number of children: N/A  . Years of education: N/A   Occupational History  . Not on file.   Social History Main Topics  . Smoking status: Never Smoker  . Smokeless tobacco: Current User    Types: Snuff  . Alcohol use 0.6 oz/week    1 Standard drinks or equivalent per week     Comment: occasional beer  . Drug use: No  . Sexual activity: Not on file   Other Topics Concern  . Not on file   Social History Narrative   Retired homemaker   Widow   Completed 7th grade   2 daughter   [name] and daughter. Daughter local, son in [place]   4 grandchildren   Enjoys puzzles, gardening.       Past Surgical History:  Procedure Laterality Date  . ABDOMINAL HYSTERECTOMY    . KNEE ARTHROSCOPY Bilateral   . REPLACEMENT TOTAL KNEE BILATERAL     1998 left , 1992 right    Family History  Problem Relation Age of Onset  . Arthritis Mother   . Depression Mother   . Heart attack Father     20   . Hypertension Father     No Known Allergies  Current Outpatient Prescriptions on File Prior to Visit  Medication Sig Dispense Refill  . amLODipine (NORVASC) 5 MG  tablet TAKE ONE TABLET BY MOUTH DAILY 90 tablet 1  . aspirin EC 81 MG tablet Take 81 mg by mouth daily.    Marland Kitchen atorvastatin (LIPITOR) 10 MG tablet Take 1 tablet (10 mg total) by mouth at bedtime. 90 tablet 1  . B-D UF III MINI PEN NEEDLES 31G X 5 MM MISC USE DAILY WITH LANTUS SOLOSTAR PENS 100 each 2  . Blood Glucose Monitoring Suppl (ACCU-CHEK AVIVA PLUS) w/Device KIT Use to check blood sugar once a day.  DX E11.9 1 kit 0  . buPROPion (WELLBUTRIN) 100 MG tablet TAKE 1 TABLET  BY MOUTH 2 TIMES DAILY. 60 tablet 5  . carvedilol (COREG) 12.5 MG tablet TAKE ONE TABLET BY MOUTH TWICE A DAY WITH A MEAL 60 tablet 5  . glucose blood (ACCU-CHEK AVIVA PLUS) test strip Use as instructed to check blood sugar twice a day.  DX E11.9 200 each 1  . hydrOXYzine (ATARAX/VISTARIL) 25 MG tablet TAKE ONE TABLET BY MOUTH TWICE A DAY AS NEEDED FOR ANXIETY 60 tablet 5  . ibuprofen (ADVIL,MOTRIN) 600 MG tablet Take 1 tablet (600  mg total) by mouth every 6 (six) hours as needed. 30 tablet 0  . Insulin Glargine (LANTUS SOLOSTAR) 100 UNIT/ML Solostar Pen Inject 14 Units into the skin daily at 10 pm. 15 mL 1  . Lancet Devices (ACCU-CHEK SOFTCLIX) lancets Use to check blood sugar twice a day.  DX E11.9 100 each 5  . levothyroxine (SYNTHROID, LEVOTHROID) 125 MCG tablet Take 0.5 tablets (62.5 mcg total) by mouth daily before breakfast. 30 tablet 2  . lisinopril (PRINIVIL,ZESTRIL) 40 MG tablet TAKE ONE TABLET BY MOUTH DAILY 90 tablet 0   No current facility-administered medications on file prior to visit.     BP (!) 142/65 (BP Location: Right Arm, Cuff Size: Normal)   Pulse 68   Temp 98.3 F (36.8 C) (Oral)   Resp 16   Ht 5' 8"  (1.727 m)   Wt 146 lb (66.2 kg)   SpO2 98% Comment: room air  BMI 22.20 kg/m    Objective:   Physical Exam  Constitutional: She is oriented to person, place, and time. She appears well-developed and well-nourished.  Cardiovascular: Normal rate, regular rhythm and normal heart sounds.   No  murmur heard. Pulmonary/Chest: Effort normal and breath sounds normal. No respiratory distress. She has no wheezes.  Musculoskeletal: She exhibits no edema.  Neurological: She is alert and oriented to person, place, and time.  Psychiatric: She has a normal mood and affect. Her behavior is normal. Judgment and thought content normal.          Assessment & Plan:

## 2016-01-11 NOTE — Patient Instructions (Signed)
Please schedule a lab visit after 01/14/15.

## 2016-01-12 NOTE — Assessment & Plan Note (Signed)
Clinically stable. Continue current dose of lantus.

## 2016-01-12 NOTE — Assessment & Plan Note (Signed)
Clinically stable. Obtain follow up tsh.  Continue synthroid.  

## 2016-01-15 ENCOUNTER — Other Ambulatory Visit: Payer: Medicare Other

## 2016-01-15 ENCOUNTER — Other Ambulatory Visit (INDEPENDENT_AMBULATORY_CARE_PROVIDER_SITE_OTHER): Payer: Medicare Other

## 2016-01-15 DIAGNOSIS — E039 Hypothyroidism, unspecified: Secondary | ICD-10-CM | POA: Diagnosis not present

## 2016-01-15 DIAGNOSIS — E119 Type 2 diabetes mellitus without complications: Secondary | ICD-10-CM

## 2016-01-15 LAB — TSH: TSH: 3.8 u[IU]/mL (ref 0.35–4.50)

## 2016-01-15 LAB — BASIC METABOLIC PANEL
BUN: 31 mg/dL — ABNORMAL HIGH (ref 6–23)
CHLORIDE: 104 meq/L (ref 96–112)
CO2: 26 meq/L (ref 19–32)
Calcium: 9.6 mg/dL (ref 8.4–10.5)
Creatinine, Ser: 1.53 mg/dL — ABNORMAL HIGH (ref 0.40–1.20)
GFR: 34.88 mL/min — ABNORMAL LOW (ref >60.00)
Glucose, Bld: 224 mg/dL — ABNORMAL HIGH (ref 70–99)
POTASSIUM: 4.7 meq/L (ref 3.5–5.1)
SODIUM: 137 meq/L (ref 135–145)

## 2016-01-15 LAB — HEMOGLOBIN A1C: Hgb A1c MFr Bld: 8.7 % — ABNORMAL HIGH (ref 4.6–6.5)

## 2016-01-17 ENCOUNTER — Telehealth: Payer: Self-pay | Admitting: Family

## 2016-01-17 DIAGNOSIS — E1122 Type 2 diabetes mellitus with diabetic chronic kidney disease: Secondary | ICD-10-CM

## 2016-01-17 DIAGNOSIS — Z794 Long term (current) use of insulin: Principal | ICD-10-CM

## 2016-01-17 DIAGNOSIS — E1165 Type 2 diabetes mellitus with hyperglycemia: Principal | ICD-10-CM

## 2016-01-17 NOTE — Telephone Encounter (Signed)
See mychart.  

## 2016-01-22 ENCOUNTER — Other Ambulatory Visit: Payer: Self-pay | Admitting: Family

## 2016-01-25 ENCOUNTER — Emergency Department (HOSPITAL_COMMUNITY): Payer: Medicare Other

## 2016-01-25 ENCOUNTER — Inpatient Hospital Stay (HOSPITAL_COMMUNITY)
Admission: EM | Admit: 2016-01-25 | Discharge: 2016-01-29 | DRG: 470 | Disposition: A | Discharge status: Home Health | Payer: Medicare Other | Attending: Internal Medicine | Admitting: Internal Medicine

## 2016-01-25 ENCOUNTER — Encounter (HOSPITAL_COMMUNITY): Payer: Self-pay

## 2016-01-25 DIAGNOSIS — I129 Hypertensive chronic kidney disease with stage 1 through stage 4 chronic kidney disease, or unspecified chronic kidney disease: Secondary | ICD-10-CM | POA: Diagnosis present

## 2016-01-25 DIAGNOSIS — D72829 Elevated white blood cell count, unspecified: Secondary | ICD-10-CM | POA: Diagnosis present

## 2016-01-25 DIAGNOSIS — Z96653 Presence of artificial knee joint, bilateral: Secondary | ICD-10-CM | POA: Diagnosis present

## 2016-01-25 DIAGNOSIS — E039 Hypothyroidism, unspecified: Secondary | ICD-10-CM | POA: Diagnosis present

## 2016-01-25 DIAGNOSIS — E1165 Type 2 diabetes mellitus with hyperglycemia: Secondary | ICD-10-CM | POA: Diagnosis not present

## 2016-01-25 DIAGNOSIS — M25559 Pain in unspecified hip: Secondary | ICD-10-CM | POA: Diagnosis not present

## 2016-01-25 DIAGNOSIS — E785 Hyperlipidemia, unspecified: Secondary | ICD-10-CM | POA: Diagnosis present

## 2016-01-25 DIAGNOSIS — R079 Chest pain, unspecified: Secondary | ICD-10-CM | POA: Diagnosis not present

## 2016-01-25 DIAGNOSIS — N183 Chronic kidney disease, stage 3 unspecified: Secondary | ICD-10-CM

## 2016-01-25 DIAGNOSIS — F418 Other specified anxiety disorders: Secondary | ICD-10-CM | POA: Diagnosis present

## 2016-01-25 DIAGNOSIS — S299XXA Unspecified injury of thorax, initial encounter: Secondary | ICD-10-CM | POA: Diagnosis not present

## 2016-01-25 DIAGNOSIS — H905 Unspecified sensorineural hearing loss: Secondary | ICD-10-CM | POA: Diagnosis not present

## 2016-01-25 DIAGNOSIS — E1122 Type 2 diabetes mellitus with diabetic chronic kidney disease: Secondary | ICD-10-CM | POA: Diagnosis present

## 2016-01-25 DIAGNOSIS — E118 Type 2 diabetes mellitus with unspecified complications: Secondary | ICD-10-CM

## 2016-01-25 DIAGNOSIS — Z794 Long term (current) use of insulin: Secondary | ICD-10-CM

## 2016-01-25 DIAGNOSIS — H903 Sensorineural hearing loss, bilateral: Secondary | ICD-10-CM | POA: Diagnosis present

## 2016-01-25 DIAGNOSIS — I10 Essential (primary) hypertension: Secondary | ICD-10-CM | POA: Diagnosis present

## 2016-01-25 DIAGNOSIS — F17210 Nicotine dependence, cigarettes, uncomplicated: Secondary | ICD-10-CM | POA: Diagnosis present

## 2016-01-25 DIAGNOSIS — E7800 Pure hypercholesterolemia, unspecified: Secondary | ICD-10-CM | POA: Diagnosis not present

## 2016-01-25 DIAGNOSIS — W19XXXA Unspecified fall, initial encounter: Secondary | ICD-10-CM

## 2016-01-25 DIAGNOSIS — W010XXA Fall on same level from slipping, tripping and stumbling without subsequent striking against object, initial encounter: Secondary | ICD-10-CM | POA: Diagnosis present

## 2016-01-25 DIAGNOSIS — E1365 Other specified diabetes mellitus with hyperglycemia: Secondary | ICD-10-CM | POA: Diagnosis not present

## 2016-01-25 DIAGNOSIS — S72001A Fracture of unspecified part of neck of right femur, initial encounter for closed fracture: Secondary | ICD-10-CM | POA: Diagnosis not present

## 2016-01-25 DIAGNOSIS — Z79899 Other long term (current) drug therapy: Secondary | ICD-10-CM | POA: Diagnosis not present

## 2016-01-25 DIAGNOSIS — S72011A Unspecified intracapsular fracture of right femur, initial encounter for closed fracture: Principal | ICD-10-CM | POA: Diagnosis present

## 2016-01-25 DIAGNOSIS — Z7982 Long term (current) use of aspirin: Secondary | ICD-10-CM | POA: Diagnosis not present

## 2016-01-25 DIAGNOSIS — D62 Acute posthemorrhagic anemia: Secondary | ICD-10-CM | POA: Diagnosis not present

## 2016-01-25 DIAGNOSIS — S72041A Displaced fracture of base of neck of right femur, initial encounter for closed fracture: Secondary | ICD-10-CM | POA: Diagnosis not present

## 2016-01-25 DIAGNOSIS — S72001D Fracture of unspecified part of neck of right femur, subsequent encounter for closed fracture with routine healing: Secondary | ICD-10-CM | POA: Diagnosis not present

## 2016-01-25 DIAGNOSIS — R259 Unspecified abnormal involuntary movements: Secondary | ICD-10-CM | POA: Diagnosis not present

## 2016-01-25 LAB — CBC WITH DIFFERENTIAL/PLATELET
BASOS PCT: 0 %
Basophils Absolute: 0 10*3/uL (ref 0.0–0.1)
EOS ABS: 0.1 10*3/uL (ref 0.0–0.7)
Eosinophils Relative: 1 %
HEMATOCRIT: 40.4 % (ref 36.0–46.0)
HEMOGLOBIN: 13.4 g/dL (ref 12.0–15.0)
LYMPHS ABS: 0.9 10*3/uL (ref 0.7–4.0)
Lymphocytes Relative: 8 %
MCH: 30 pg (ref 26.0–34.0)
MCHC: 33.2 g/dL (ref 30.0–36.0)
MCV: 90.4 fL (ref 78.0–100.0)
MONOS PCT: 5 %
Monocytes Absolute: 0.6 10*3/uL (ref 0.1–1.0)
NEUTROS ABS: 10.2 10*3/uL — AB (ref 1.7–7.7)
NEUTROS PCT: 86 %
Platelets: 207 10*3/uL (ref 150–400)
RBC: 4.47 MIL/uL (ref 3.87–5.11)
RDW: 12.2 % (ref 11.5–15.5)
WBC: 11.8 10*3/uL — AB (ref 4.0–10.5)

## 2016-01-25 LAB — CBG MONITORING, ED: Glucose-Capillary: 302 mg/dL — ABNORMAL HIGH (ref 65–99)

## 2016-01-25 LAB — URINALYSIS, ROUTINE W REFLEX MICROSCOPIC
Bacteria, UA: NONE SEEN
Bilirubin Urine: NEGATIVE
Glucose, UA: >=500 mg/dL — AB
HGB URINE DIPSTICK: NEGATIVE
KETONES UR: NEGATIVE mg/dL
LEUKOCYTES UA: NEGATIVE
Nitrite: NEGATIVE
PH: 8 (ref 5.0–8.0)
Protein, ur: NEGATIVE mg/dL
SPECIFIC GRAVITY, URINE: 1.01 (ref 1.005–1.030)

## 2016-01-25 LAB — BASIC METABOLIC PANEL
Anion gap: 8 (ref 5–15)
BUN: 21 mg/dL — ABNORMAL HIGH (ref 6–20)
CHLORIDE: 106 mmol/L (ref 101–111)
CO2: 26 mmol/L (ref 22–32)
Calcium: 9.5 mg/dL (ref 8.9–10.3)
Creatinine, Ser: 1.34 mg/dL — ABNORMAL HIGH (ref 0.44–1.00)
GFR calc non Af Amer: 37 mL/min — ABNORMAL LOW (ref >60)
GFR, EST AFRICAN AMERICAN: 43 mL/min — AB (ref >60)
Glucose, Bld: 208 mg/dL — ABNORMAL HIGH (ref 65–99)
POTASSIUM: 4.4 mmol/L (ref 3.5–5.1)
SODIUM: 140 mmol/L (ref 135–145)

## 2016-01-25 LAB — GLUCOSE, CAPILLARY: GLUCOSE-CAPILLARY: 245 mg/dL — AB (ref 65–99)

## 2016-01-25 LAB — TYPE AND SCREEN
ABO/RH(D): O POS
Antibody Screen: NEGATIVE

## 2016-01-25 LAB — ABO/RH: ABO/RH(D): O POS

## 2016-01-25 LAB — APTT: aPTT: 27 seconds (ref 24–36)

## 2016-01-25 MED ORDER — AMLODIPINE BESYLATE 5 MG PO TABS
5.0000 mg | ORAL_TABLET | Freq: Every day | ORAL | Status: DC
  Administered 2016-01-25 – 2016-01-29 (×4): 5 mg via ORAL
  Filled 2016-01-25 (×5): qty 1

## 2016-01-25 MED ORDER — SODIUM CHLORIDE 0.9 % IV SOLN
INTRAVENOUS | Status: DC
  Administered 2016-01-25 – 2016-01-28 (×3): via INTRAVENOUS

## 2016-01-25 MED ORDER — CARVEDILOL 12.5 MG PO TABS
12.5000 mg | ORAL_TABLET | Freq: Two times a day (BID) | ORAL | Status: DC
  Administered 2016-01-25 – 2016-01-29 (×7): 12.5 mg via ORAL
  Filled 2016-01-25 (×8): qty 1

## 2016-01-25 MED ORDER — HYDROCODONE-ACETAMINOPHEN 5-325 MG PO TABS
1.0000 | ORAL_TABLET | Freq: Four times a day (QID) | ORAL | Status: DC | PRN
  Administered 2016-01-25 (×2): 1 via ORAL
  Administered 2016-01-26 (×2): 2 via ORAL
  Filled 2016-01-25: qty 1
  Filled 2016-01-25: qty 2
  Filled 2016-01-25: qty 1
  Filled 2016-01-25: qty 2

## 2016-01-25 MED ORDER — FENTANYL CITRATE (PF) 100 MCG/2ML IJ SOLN
50.0000 ug | Freq: Once | INTRAMUSCULAR | Status: AC
  Administered 2016-01-25: 50 ug via INTRAVENOUS
  Filled 2016-01-25: qty 2

## 2016-01-25 MED ORDER — INSULIN ASPART 100 UNIT/ML ~~LOC~~ SOLN
0.0000 [IU] | Freq: Three times a day (TID) | SUBCUTANEOUS | Status: DC
  Administered 2016-01-25: 7 [IU] via SUBCUTANEOUS
  Administered 2016-01-26 – 2016-01-27 (×3): 2 [IU] via SUBCUTANEOUS
  Administered 2016-01-28: 3 [IU] via SUBCUTANEOUS
  Administered 2016-01-28 – 2016-01-29 (×3): 2 [IU] via SUBCUTANEOUS
  Filled 2016-01-25: qty 1

## 2016-01-25 MED ORDER — BISACODYL 5 MG PO TBEC: 5.0000 mg | DELAYED_RELEASE_TABLET | Freq: Every day | ORAL | Status: DC | PRN

## 2016-01-25 MED ORDER — HYDROXYZINE HCL 25 MG PO TABS: 25.0000 mg | ORAL_TABLET | Freq: Two times a day (BID) | ORAL | Status: DC | PRN

## 2016-01-25 MED ORDER — BUPROPION HCL 100 MG PO TABS
100.0000 mg | ORAL_TABLET | Freq: Two times a day (BID) | ORAL | Status: DC
  Administered 2016-01-25 – 2016-01-29 (×8): 100 mg via ORAL
  Filled 2016-01-25 (×8): qty 1

## 2016-01-25 MED ORDER — SENNOSIDES-DOCUSATE SODIUM 8.6-50 MG PO TABS: 1.0000 | ORAL_TABLET | Freq: Every evening | ORAL | Status: DC | PRN

## 2016-01-25 MED ORDER — LEVOTHYROXINE SODIUM 50 MCG PO TABS
62.5000 ug | ORAL_TABLET | Freq: Every day | ORAL | Status: DC
  Administered 2016-01-26 – 2016-01-29 (×4): 62.5 ug via ORAL
  Filled 2016-01-25 (×3): qty 1
  Filled 2016-01-25: qty 0.5
  Filled 2016-01-25: qty 1

## 2016-01-25 MED ORDER — CEFAZOLIN SODIUM-DEXTROSE 2-4 GM/100ML-% IV SOLN
2.0000 g | INTRAVENOUS | Status: AC
  Administered 2016-01-26: 2 g via INTRAVENOUS
  Filled 2016-01-25 (×2): qty 100

## 2016-01-25 MED ORDER — ATORVASTATIN CALCIUM 10 MG PO TABS
10.0000 mg | ORAL_TABLET | Freq: Every day | ORAL | Status: DC
  Administered 2016-01-25 – 2016-01-28 (×4): 10 mg via ORAL
  Filled 2016-01-25 (×6): qty 1

## 2016-01-25 MED ORDER — INSULIN GLARGINE 100 UNIT/ML SOLOSTAR PEN: 14.0000 [IU] | PEN_INJECTOR | Freq: Every day | SUBCUTANEOUS | Status: DC

## 2016-01-25 MED ORDER — MORPHINE SULFATE (PF) 4 MG/ML IV SOLN
1.0000 mg | INTRAVENOUS | Status: DC | PRN
  Administered 2016-01-25 – 2016-01-28 (×4): 1 mg via INTRAVENOUS
  Filled 2016-01-25 (×4): qty 1

## 2016-01-25 MED ORDER — ACETAMINOPHEN 325 MG PO TABS
650.0000 mg | ORAL_TABLET | ORAL | Status: DC | PRN
  Administered 2016-01-25 – 2016-01-27 (×2): 650 mg via ORAL
  Filled 2016-01-25 (×2): qty 2

## 2016-01-25 MED ORDER — POVIDONE-IODINE 10 % EX SWAB
2.0000 "application " | Freq: Once | CUTANEOUS | Status: AC
  Administered 2016-01-26: 2 via TOPICAL

## 2016-01-25 MED ORDER — SODIUM CHLORIDE 0.9 % IV BOLUS (SEPSIS)
500.0000 mL | Freq: Once | INTRAVENOUS | Status: AC
  Administered 2016-01-25: 500 mL via INTRAVENOUS

## 2016-01-25 MED ORDER — ZOLPIDEM TARTRATE 5 MG PO TABS
5.0000 mg | ORAL_TABLET | Freq: Once | ORAL | Status: AC
  Administered 2016-01-25: 5 mg via ORAL
  Filled 2016-01-25: qty 1

## 2016-01-25 MED ORDER — ASPIRIN EC 81 MG PO TBEC
81.0000 mg | DELAYED_RELEASE_TABLET | Freq: Every day | ORAL | Status: DC
  Administered 2016-01-25: 81 mg via ORAL
  Filled 2016-01-25: qty 1

## 2016-01-25 MED ORDER — FLEET ENEMA 7-19 GM/118ML RE ENEM: 1.0000 | ENEMA | Freq: Once | RECTAL | Status: DC | PRN

## 2016-01-25 MED ORDER — MORPHINE SULFATE (PF) 4 MG/ML IV SOLN
4.0000 mg | Freq: Once | INTRAVENOUS | Status: AC
  Administered 2016-01-25: 4 mg via INTRAVENOUS
  Filled 2016-01-25: qty 1

## 2016-01-25 MED ORDER — INSULIN GLARGINE 100 UNIT/ML ~~LOC~~ SOLN
14.0000 [IU] | Freq: Every day | SUBCUTANEOUS | Status: DC
  Administered 2016-01-25 – 2016-01-27 (×3): 14 [IU] via SUBCUTANEOUS
  Filled 2016-01-25 (×4): qty 0.14

## 2016-01-25 MED ORDER — CHLORHEXIDINE GLUCONATE 4 % EX LIQD
60.0000 mL | Freq: Once | CUTANEOUS | Status: AC
  Administered 2016-01-26: 4 via TOPICAL
  Filled 2016-01-25: qty 60

## 2016-01-25 MED ORDER — LISINOPRIL 40 MG PO TABS
40.0000 mg | ORAL_TABLET | Freq: Every day | ORAL | Status: DC
  Administered 2016-01-25 – 2016-01-29 (×4): 40 mg via ORAL
  Filled 2016-01-25: qty 2
  Filled 2016-01-25 (×4): qty 1

## 2016-01-25 NOTE — ED Notes (Signed)
PA informed pt pain still 7/10.

## 2016-01-25 NOTE — Progress Notes (Signed)
Inpatient Diabetes Program Recommendations  AACE/ADA: New Consensus Statement on Inpatient Glycemic Control (2015)  Target Ranges:  Prepandial:   less than 140 mg/dL      Peak postprandial:   less than 180 mg/dL (1-2 hours)      Critically ill patients:  140 - 180 mg/dL   Lab Results  Component Value Date   GLUCAP 302 (H) 01/25/2016   HGBA1C 8.7 (H) 01/15/2016    Review of Glycemic Control:  Results for Jessica Meyer, [facility] (MRN [id number]) as of 01/25/2016 18:54  Ref. Range 01/25/2016 18:26  Glucose-Capillary Latest Ref Range: 65 - 99 mg/dL 829302 (H)    Diabetes history: Type 2 diabetes Outpatient Diabetes medications: Lantus 14 units daily-takes at lunch Current orders for Inpatient glycemic control:  Lantus 14 units q HS, Novolog sensitive tid with meals   Inpatient Diabetes Program Recommendations:    Note blood sugars increased in ED.  Please consider increasing frequency of Novolog correction to q 4 hours while NPO.  Agree with Lantus insulin dose ordered for tonight.  Will follow.  Thanks, Beryl MeagerJenny Hannie Shoe, RN, BC-ADM Inpatient Diabetes Coordinator Pager [phone] (8a-5p)

## 2016-01-25 NOTE — Consult Note (Signed)
ORTHOPAEDIC CONSULTATION  REQUESTING PHYSICIAN: Waldemar Dickens, MD  Chief Complaint: Right hip pain and inability to bear weight after mechanical fall.  Assessment / plan: Active Problems:   ASNHL (asymmetrical sensorineural hearing loss)   Diabetes type 2, controlled (HCC)   Essential (primary) hypertension   Depression with anxiety   Hyperlipidemia   Closed right hip fracture (HCC)   Leukocytosis  Plan for right hip hemiarthroplasty tomorrow. -NPO at midnight -Medicine team to admit and perform pre-op clearance -PT/OT post op -will ammend WB status postop, bedrest for now -Foley for comfort prn to be removed POD 1/2 -Patient hoping to return home with home health PT. She lives alone. However, she may require Rehab or SNF placement upon discharge.  -VTE prophylaxis: SCDs.  The risks benefits and alternatives of the procedure were discussed with the patient including but not limited to infection, bleeding, nerve injury, the need for revision surgery, blood clots, cardiopulmonary complications, morbidity, mortality, among others.  The patient verbalizes understanding and wishes to proceed.    HPI: Jessica Meyer is a 81 y.o. female who complains of right hip pain after a fall this morning. She denies dizziness, loss of consciousness. She believes that she tripped. No history of DVT, PE, MI, or CVA. Not on anticoagulation.  XR shows subcapital femoral neck fracture.  Orthopedics was consulted for evaluation.    Past Medical History:  Diagnosis Date  . Arthritis   . Diabetes mellitus without complication ([place])   . History of chicken pox   . Hyperlipidemia   . Hypertension   . Thyroid disease    hypothyroid   Past Surgical History:  Procedure Laterality Date  . ABDOMINAL HYSTERECTOMY    . KNEE ARTHROSCOPY Bilateral   . REPLACEMENT TOTAL KNEE BILATERAL     1998 left , 1992 right   Social History   Social History  . Marital status: Single    Spouse name: N/A    . Number of children: N/A  . Years of education: N/A   Social History Main Topics  . Smoking status: Never Smoker  . Smokeless tobacco: Current User    Types: Snuff  . Alcohol use 0.6 oz/week    1 Standard drinks or equivalent per week     Comment: occasional beer  . Drug use: No  . Sexual activity: Not Asked   Other Topics Concern  . None   Social History Narrative   Retired Financial controller   Completed 7th grade   2 daughter   [name] and daughter. Daughter local, son in [place]   4 grandchildren   Enjoys puzzles, gardening.      Family History  Problem Relation Age of Onset  . Arthritis Mother   . Depression Mother   . Heart attack Father     45   . Hypertension Father    No Known Allergies Prior to Admission medications   Medication Sig Start Date End Date Taking? Authorizing Provider  amLODipine (NORVASC) 5 MG tablet TAKE ONE TABLET BY MOUTH DAILY 07/27/15  Yes Debbrah Alar, NP  aspirin EC 81 MG tablet Take 81 mg by mouth daily.   Yes Historical Provider, MD  atorvastatin (LIPITOR) 10 MG tablet Take 1 tablet (10 mg total) by mouth at bedtime. 01/13/15  Yes Debbrah Alar, NP  B-D UF III MINI PEN NEEDLES 31G X 5 MM MISC USE DAILY WITH LANTUS SOLOSTAR PENS 11/16/15  Yes Debbrah Alar, NP  Blood Glucose Monitoring Suppl (  ACCU-CHEK AVIVA PLUS) w/Device KIT Use to check blood sugar once a day.  DX E11.9 11/24/15  Yes Debbrah Alar, NP  buPROPion (WELLBUTRIN) 100 MG tablet TAKE 1 TABLET  BY MOUTH 2 TIMES DAILY. 11/02/15  Yes Debbrah Alar, NP  carvedilol (COREG) 12.5 MG tablet TAKE ONE TABLET BY MOUTH TWICE A DAY WITH A MEAL 11/03/15  Yes Debbrah Alar, NP  glucose blood (ACCU-CHEK AVIVA PLUS) test strip Use as instructed to check blood sugar twice a day.  DX E11.9 11/24/15  Yes Debbrah Alar, NP  hydrOXYzine (ATARAX/VISTARIL) 25 MG tablet TAKE ONE TABLET BY MOUTH TWICE A DAY AS NEEDED FOR ANXIETY 09/15/15  Yes Debbrah Alar, NP  Lancet  [facility]) lancets Use to check blood sugar twice a day.  DX E11.9 12/03/15  Yes Debbrah Alar, NP  LANTUS SOLOSTAR 100 UNIT/ML Solostar Pen INJECT 14 UNITS INTO THE SKIN BEFORE LUNCH 01/22/16  Yes Debbrah Alar, NP  levothyroxine (SYNTHROID, LEVOTHROID) 125 MCG tablet Take 0.5 tablets (62.5 mcg total) by mouth daily before breakfast. 09/16/15  Yes Debbrah Alar, NP  lisinopril (PRINIVIL,ZESTRIL) 40 MG tablet TAKE ONE TABLET BY MOUTH DAILY 12/30/15  Yes Debbrah Alar, NP  ibuprofen (ADVIL,MOTRIN) 600 MG tablet Take 1 tablet (600 mg total) by mouth every 6 (six) hours as needed. Patient not taking: Reported on 01/25/2016 05/07/15   Marella Chimes, PA-C   Dg Chest 1 View  Result Date: 01/25/2016 CLINICAL DATA:  Pain following fall EXAM: CHEST 1 VIEW COMPARISON:  November 07, 2015 FINDINGS: There is slight atelectasis in the left base. Lungs elsewhere clear. Heart size and pulmonary vascularity are normal. No adenopathy. Bones are osteoporotic. There is atherosclerotic calcification in the aorta. There is calcification in the right carotid artery. IMPRESSION: Slight left base atelectasis. No edema or consolidation. No pneumothorax. Aortic and right carotid artery calcifications/atherosclerosis. Electronically Signed   By: Lowella Grip III M.D.   On: 01/25/2016 12:09   Dg Hip Unilat W Or Wo Pelvis 2-3 Views Right  Result Date: 01/25/2016 CLINICAL DATA:  Pain following fall EXAM: DG HIP (WITH OR WITHOUT PELVIS) 2-3V RIGHT COMPARISON:  None. FINDINGS: Frontal pelvis as well as frontal and lateral right hip images were obtained. There is a subcapital femoral neck fracture on the right with mild displacement of fracture fragments. No other fracture evident. No dislocation. There is mild symmetric narrowing of both hip joints. Bones are diffusely osteoporotic. There is also degenerative change in the lower lumbar spine. IMPRESSION: Mildly displaced subcapital femoral  neck fracture. No dislocation. Bones osteoporotic. Electronically Signed   By: Lowella Grip III M.D.   On: 01/25/2016 12:08    Positive ROS: All other systems have been reviewed and were otherwise negative with the exception of those mentioned in the HPI and as above.  Objective: Labs cbc  Recent Labs  01/25/16 1120  WBC 11.8*  HGB 13.4  HCT 40.4  PLT 207     Recent Labs  01/25/16 1120  NA 140  K 4.4  CL 106  CO2 26  GLUCOSE 208*  BUN 21*  CREATININE 1.34*  CALCIUM 9.5    Physical Exam: Vitals:   01/25/16 1500 01/25/16 1530  BP: (!) 111/54 108/59  Pulse: 64 65  Resp: 20 16  Temp:     General: Alert, no acute distress.  Family in room Mental status: Alert and Oriented x3 Neurologic: Speech Clear and organized, no gross focal findings or movement disorder appreciated. Respiratory: No cyanosis, no use of  accessory musculature Cardiovascular: No pedal edema GI: Abdomen is soft and non-tender, non-distended. Skin: Warm and dry.  No lesions in the area of chief complaint.  B/L elbows w/ superficial abrasion - bandage in place. Extremities: Warm and well perfused w/o edema Psychiatric: Patient is competent for consent with normal mood and affect  MUSCULOSKELETAL:  Right hip pain with range of motion. Neurovascular intact distally. EHL, FHL, dorsiflexion, plantar flexion intact. Other extremities are atraumatic with painless ROM and NVI.   Prudencio Burly III, PA-C 01/25/2016 4:53 PM

## 2016-01-25 NOTE — ED Triage Notes (Signed)
Pt. Arrived due to a fall at her home this morning,   Pt. Stated, :"My feet got tangled up and I fell."    She dragged herself to the phone and called   . Pt. Denies any LOC   Denies hitting her head.  Rt. Hip/pelvic pain.   Shortening and outward rotation noted by Paramedic/  Pt. Is alert and oriented X4  Skin is warm and dry.

## 2016-01-25 NOTE — H&P (Signed)
History and Physical    [name] UYE:[id number] DOB: Jun 02, 1935 DOA: 01/25/2016   PCP: Nance Pear., NP   Patient coming from:  Home     Chief Complaint: Mechanical Fall with right hip fracture   HPI: Jessica Meyer is a 81 y.o. female with a history of DM, HTN, HLD, hypothyroidism, anxiety, depression, brought to the emergency department after sustaining a mechanical fall as she was walking in her then, tripping in landing on her right hip. She denies any prior similar events. No LOC. No seizures. No dizziness or headaches. Denies any nausea or vomiting . Denies shortness of breath or chest pain. Denies any sensory or motor deficiencies since falling. Denies any urine or fecal  Retention or incontinence.     ED Course:  BP 120/61   Pulse 72   Temp 98 F (36.7 C) (Oral)   Resp 18   Ht _0  (1.727 m)   Wt 66.2 kg (146 lb)   SpO2 95%   BMI 22.20 kg/m    right hip x-ray shows mildly displaced supple capital femoral neck fracture. Orthopedic consult is pending, plan for surgery in a.m. Labs show white count 11.8, glucose 208, creatinine 1.34. Received IV fluids bolus at 100 mL, contending and 50 g x1, and morphine 4 mgx1 Sinus rhythm Normal ECG No significant change since last tracing   Review of Systems: As per HPI otherwise 10 point review of systems negative.   Past Medical History:  Diagnosis Date  . Arthritis   . Diabetes mellitus without complication ([place])   . History of chicken pox   . Hyperlipidemia   . Hypertension   . Thyroid disease    hypothyroid    Past Surgical History:  Procedure Laterality Date  . ABDOMINAL HYSTERECTOMY    . KNEE ARTHROSCOPY Bilateral   . REPLACEMENT TOTAL KNEE BILATERAL     1998 left , 1992 right    Social History Social History   Social History  . Marital status: Single    Spouse name: N/A  . Number of children: N/A  . Years of education: N/A   Occupational History  . Not on file.   Social History Main  Topics  . Smoking status: Never Smoker  . Smokeless tobacco: Current User    Types: Snuff  . Alcohol use 0.6 oz/week    1 Standard drinks or equivalent per week     Comment: occasional beer  . Drug use: No  . Sexual activity: Not on file   Other Topics Concern  . Not on file   Social History Narrative   Retired homemaker   Widow   Completed 7th grade   2 daughter   [name] and daughter. Daughter local, son in [place]   4 grandchildren   Enjoys puzzles, gardening.        No Known Allergies  Family History  Problem Relation Age of Onset  . Arthritis Mother   . Depression Mother   . Heart attack Father     106   . Hypertension Father       Prior to Admission medications   Medication Sig Start Date End Date Taking? Authorizing Provider  amLODipine (NORVASC) 5 MG tablet TAKE ONE TABLET BY MOUTH DAILY 07/27/15   Debbrah Alar, NP  aspirin EC 81 MG tablet Take 81 mg by mouth daily.    Historical Provider, MD  atorvastatin (LIPITOR) 10 MG tablet Take 1 tablet (10 mg total) by mouth at bedtime. 01/13/15  Debbrah Alar, NP  B-D UF III MINI PEN NEEDLES 31G X 5 MM MISC USE DAILY WITH LANTUS SOLOSTAR PENS 11/16/15   Debbrah Alar, NP  Blood Glucose Monitoring Suppl (ACCU-CHEK AVIVA PLUS) w/Device KIT Use to check blood sugar once a day.  DX E11.9 11/24/15   Debbrah Alar, NP  buPROPion (WELLBUTRIN) 100 MG tablet TAKE 1 TABLET  BY MOUTH 2 TIMES DAILY. 11/02/15   Debbrah Alar, NP  carvedilol (COREG) 12.5 MG tablet TAKE ONE TABLET BY MOUTH TWICE A DAY WITH A MEAL 11/03/15   Debbrah Alar, NP  glucose blood (ACCU-CHEK AVIVA PLUS) test strip Use as instructed to check blood sugar twice a day.  DX E11.9 11/24/15   Debbrah Alar, NP  hydrOXYzine (ATARAX/VISTARIL) 25 MG tablet TAKE ONE TABLET BY MOUTH TWICE A DAY AS NEEDED FOR ANXIETY 09/15/15   Debbrah Alar, NP  ibuprofen (ADVIL,MOTRIN) 600 MG tablet Take 1 tablet (600 mg total) by mouth every 6 (six) hours  as needed. 05/07/15   Marella Chimes, PA-C  Lancet Devices [facility]) lancets Use to check blood sugar twice a day.  DX E11.9 12/03/15   Debbrah Alar, NP  LANTUS SOLOSTAR 100 UNIT/ML Solostar Pen INJECT 14 UNITS INTO THE SKIN BEFORE LUNCH 01/22/16   Debbrah Alar, NP  levothyroxine (SYNTHROID, LEVOTHROID) 125 MCG tablet Take 0.5 tablets (62.5 mcg total) by mouth daily before breakfast. 09/16/15   Debbrah Alar, NP  lisinopril (PRINIVIL,ZESTRIL) 40 MG tablet TAKE ONE TABLET BY MOUTH DAILY 12/30/15   Debbrah Alar, NP    Physical Exam:    Vitals:   01/25/16 1238 01/25/16 1245 01/25/16 1300 01/25/16 1315  BP: 121/59 123/59 125/72 120/61  Pulse: 73 69 73 72  Resp: 18     Temp:      TempSrc:      SpO2: 96% 95% 96% 95%  Weight:      Height:           Constitutional: NAD, calm, comfortable   Vitals:   01/25/16 1238 01/25/16 1245 01/25/16 1300 01/25/16 1315  BP: 121/59 123/59 125/72 120/61  Pulse: 73 69 73 72  Resp: 18     Temp:      TempSrc:      SpO2: 96% 95% 96% 95%  Weight:      Height:       Eyes: PERRL, lids and conjunctivae normal ENMT: Mucous membranes are moist. Posterior pharynx clear of any exudate or lesions.Normal dentition.  Neck: normal, supple, no masses, no thyromegaly. No cervical spine tenderness. Respiratory: clear to auscultation bilaterally, no wheezing, no crackles. Normal respiratory effort. No accessory muscle use.  Cardiovascular: Regular rate and rhythm, no murmurs / rubs / gallops. No extremity edema. 2+ pedal pulses. No carotid bruits.  Abdomen: no tenderness, no masses palpated. No hepatosplenomegaly. Bowel sounds positive.  Musculoskeletal: right hip shortened and externally rotated. no clubbing / cyanosis.  Skin: no rashes, lesions, ulcers.  Neurologic: CN 2-12 grossly intact. Sensation intact Strength 5/5 in all 4.  Psychiatric: Normal judgment and insight. Alert and oriented x 3. Normal mood.     Labs on  Admission: I have personally reviewed following labs and imaging studies  CBC:  Recent Labs Lab 01/25/16 1120  WBC 11.8*  NEUTROABS 10.2*  HGB 13.4  HCT 40.4  MCV 90.4  PLT 628    Basic Metabolic Panel:  Recent Labs Lab 01/25/16 1120  NA 140  K 4.4  CL 106  CO2 26  GLUCOSE 208*  BUN  21*  CREATININE 1.34*  CALCIUM 9.5    GFR: Estimated Creatinine Clearance: 34.9 mL/min (by C-G formula based on SCr of 1.34 mg/dL (H)).  Liver Function Tests: No results for input(s): AST, ALT, ALKPHOS, BILITOT, PROT, ALBUMIN in the last 168 hours. No results for input(s): LIPASE, AMYLASE in the last 168 hours. No results for input(s): AMMONIA in the last 168 hours.  Coagulation Profile: No results for input(s): INR, PROTIME in the last 168 hours.  Cardiac Enzymes: No results for input(s): CKTOTAL, CKMB, CKMBINDEX, TROPONINI in the last 168 hours.  BNP (last 3 results) No results for input(s): PROBNP in the last 8760 hours.  HbA1C: No results for input(s): HGBA1C in the last 72 hours.  CBG: No results for input(s): GLUCAP in the last 168 hours.  Lipid Profile: No results for input(s): CHOL, HDL, LDLCALC, TRIG, CHOLHDL, LDLDIRECT in the last 72 hours.  Thyroid Function Tests: No results for input(s): TSH, T4TOTAL, FREET4, T3FREE, THYROIDAB in the last 72 hours.  Anemia Panel: No results for input(s): VITAMINB12, FOLATE, FERRITIN, TIBC, IRON, RETICCTPCT in the last 72 hours.  Urine analysis:    Component Value Date/Time   COLORURINE YELLOW 11/07/2015 1640   APPEARANCEUR CLEAR 11/07/2015 1640   LABSPEC 1.017 11/07/2015 1640   PHURINE 6.5 11/07/2015 1640   GLUCOSEU 500 (A) 11/07/2015 1640   GLUCOSEU 500 (A) 07/29/2014 1215   HGBUR NEGATIVE 11/07/2015 1640   BILIRUBINUR NEGATIVE 11/07/2015 1640   KETONESUR 15 (A) 11/07/2015 1640   PROTEINUR NEGATIVE 11/07/2015 1640   UROBILINOGEN 0.2 07/29/2014 1215   NITRITE NEGATIVE 11/07/2015 1640   LEUKOCYTESUR TRACE (A)  11/07/2015 1640    Sepsis Labs: _0 (procalcitonin:4,lacticidven:4) )No results found for this or any previous visit (from the past 240 hour(s)).   Radiological Exams on Admission: Dg Chest 1 View  Result Date: 01/25/2016 CLINICAL DATA:  Pain following fall EXAM: CHEST 1 VIEW COMPARISON:  November 07, 2015 FINDINGS: There is slight atelectasis in the left base. Lungs elsewhere clear. Heart size and pulmonary vascularity are normal. No adenopathy. Bones are osteoporotic. There is atherosclerotic calcification in the aorta. There is calcification in the right carotid artery. IMPRESSION: Slight left base atelectasis. No edema or consolidation. No pneumothorax. Aortic and right carotid artery calcifications/atherosclerosis. Electronically Signed   By: Lowella Grip III M.D.   On: 01/25/2016 12:09   Dg Hip Unilat W Or Wo Pelvis 2-3 Views Right  Result Date: 01/25/2016 CLINICAL DATA:  Pain following fall EXAM: DG HIP (WITH OR WITHOUT PELVIS) 2-3V RIGHT COMPARISON:  None. FINDINGS: Frontal pelvis as well as frontal and lateral right hip images were obtained. There is a subcapital femoral neck fracture on the right with mild displacement of fracture fragments. No other fracture evident. No dislocation. There is mild symmetric narrowing of both hip joints. Bones are diffusely osteoporotic. There is also degenerative change in the lower lumbar spine. IMPRESSION: Mildly displaced subcapital femoral neck fracture. No dislocation. Bones osteoporotic. Electronically Signed   By: Lowella Grip III M.D.   On: 01/25/2016 12:08    EKG: Independently reviewed.  Assessment/Plan Active Problems:   Closed right hip fracture (HCC)   ASNHL (asymmetrical sensorineural hearing loss)   Diabetes type 2, controlled (HCC)   Essential (primary) hypertension   Depression with anxiety   Hyperlipidemia   Leukocytosis   mildly displaced supple capital femoral neck fracture as evidenced by  R hip XR.  CXR NAD.  Received  IV pain meds, immobilized.   Admit to med surg, anticipating  surgery as per Ortho NPO after midnight SCDs IVF Pain control PT/OT consult tomorrow Case manager/SW for IP Rehab   Mild leukocytosis, likely reactive in the setting of pain. Inflammation. Afebrile, VSS Check UA  Repeat CBC in am.     Type II Diabetes Current blood sugar level is 208  Lab Results  Component Value Date   HGBA1C 8.7 (H) 01/15/2016  Lantus   SSI Heart healthy carb modified diet.   Hypertension BP 121/59 Pulse 73   Controlled Continue home anti-hypertensive medications    Hyperlipidemia Continue home statins  Chronic kidney disease stage 3   baseline creatinine  1.5 Current Cr 1.34, stable  Lab Results  Component Value Date   CREATININE 1.34 (H) 01/25/2016   CREATININE 1.53 (H) 01/15/2016   CREATININE 1.51 (H) 11/07/2015  Repeat CMET in am   Hypothyroidism: -Continue home Synthroid   Anxiety and Depression Continue Atarax and Wellbutrin   DVT prophylaxis: SCD's  Code Status:   Full     Family Communication:  Discussed with patient Disposition Plan: Expect patient to be discharged to home after condition improves Consults called:   Dr. Percell Miller, Ortho  Admission status:  [place], PA-C [facility]   01/25/2016, 1:28 PM

## 2016-01-25 NOTE — ED Provider Notes (Signed)
[facility] DEPT Provider Note   CSN: [id number] Arrival date & time: 01/25/16  1035     History   Chief Complaint Chief Complaint  Patient presents with  . Fall  . Hip Pain    HPI Jessica Meyer is a 81 y.o. female.  HPI Patient presents for evaluation of right hip pain after a fall just PTA.  Patient states she was walking in her den when she tripped and fell landing on her right hip.  No head injury or LOC.  She denies pain elsewhere.  No medications PTA.  Last PO intake yesterday evening.  She denies numbness.  Past Medical History:  Diagnosis Date  . Arthritis   . Diabetes mellitus without complication ([place])   . History of chicken pox   . Hyperlipidemia   . Hypertension   . Thyroid disease    hypothyroid    Patient Active Problem List   Diagnosis Date Noted  . Closed right hip fracture ([place]) 01/25/2016  . Left knee pain 05/28/2015  . Sprain of hand, third finger, right 05/28/2015  . HTN (hypertension) 07/29/2014  . Gait instability 07/29/2014  . Hyperlipidemia 07/29/2014  . Right knee pain 04/11/2014  . ASNHL (asymmetrical sensorineural hearing loss) 08/07/2013  . Diabetes type 2, controlled ([place]) 08/07/2013  . 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 Hernia, rectovaginal 08/07/2013  . Hypercholesterolemia without hypertriglyceridemia 08/07/2013  . Arthritis of knee, degenerative 08/07/2013  . Degenerative arthritis of lumbar spine 08/07/2013  . Osteopenia 08/07/2013  . Adult hypothyroidism 08/07/2013  . History of artificial joint 08/07/2013  . Essential (primary) hypertension 08/07/2013  . Depression with anxiety 08/07/2013    Past Surgical History:  Procedure Laterality Date  . ABDOMINAL HYSTERECTOMY    . KNEE ARTHROSCOPY Bilateral   . REPLACEMENT TOTAL KNEE BILATERAL     1998 left , 1992 right    OB History    No data available       Home Medications    Prior to Admission medications   Medication Sig Start Date End Date Taking? Authorizing  Provider  amLODipine (NORVASC) 5 MG tablet TAKE ONE TABLET BY MOUTH DAILY 07/27/15  Yes Debbrah Alar, NP  aspirin EC 81 MG tablet Take 81 mg by mouth daily.   Yes Historical Provider, MD  atorvastatin (LIPITOR) 10 MG tablet Take 1 tablet (10 mg total) by mouth at bedtime. 01/13/15  Yes Debbrah Alar, NP  B-D UF III MINI PEN NEEDLES 31G X 5 MM MISC USE DAILY WITH LANTUS SOLOSTAR PENS 11/16/15  Yes Debbrah Alar, NP  Blood Glucose Monitoring Suppl (ACCU-CHEK AVIVA PLUS) w/Device KIT Use to check blood sugar once a day.  DX E11.9 11/24/15  Yes Debbrah Alar, NP  buPROPion (WELLBUTRIN) 100 MG tablet TAKE 1 TABLET  BY MOUTH 2 TIMES DAILY. 11/02/15  Yes Debbrah Alar, NP  carvedilol (COREG) 12.5 MG tablet TAKE ONE TABLET BY MOUTH TWICE A DAY WITH A MEAL 11/03/15  Yes Debbrah Alar, NP  glucose blood (ACCU-CHEK AVIVA PLUS) test strip Use as instructed to check blood sugar twice a day.  DX E11.9 11/24/15  Yes Debbrah Alar, NP  hydrOXYzine (ATARAX/VISTARIL) 25 MG tablet TAKE ONE TABLET BY MOUTH TWICE A DAY AS NEEDED FOR ANXIETY 09/15/15  Yes Debbrah Alar, NP  Lancet Devices [facility]) lancets Use to check blood sugar twice a day.  DX E11.9 12/03/15  Yes Debbrah Alar, NP  LANTUS SOLOSTAR 100 UNIT/ML Solostar Pen INJECT 14 UNITS INTO THE SKIN BEFORE LUNCH 01/22/16  Yes Debbrah Alar, NP  lisinopril (PRINIVIL,ZESTRIL) 40 MG tablet TAKE ONE TABLET BY MOUTH DAILY 12/30/15  Yes Debbrah Alar, NP  ibuprofen (ADVIL,MOTRIN) 600 MG tablet Take 1 tablet (600 mg total) by mouth every 6 (six) hours as needed. Patient not taking: Reported on 01/25/2016 05/07/15   Marella Chimes, PA-C  levothyroxine (SYNTHROID, LEVOTHROID) 125 MCG tablet Take 0.5 tablets (62.5 mcg total) by mouth daily before breakfast. 09/16/15   Debbrah Alar, NP    Family History Family History  Problem Relation Age of Onset  . Arthritis Mother   . Depression Mother   . Heart  attack Father     64   . Hypertension Father     Social History Social History  Substance Use Topics  . Smoking status: Never Smoker  . Smokeless tobacco: Current User    Types: Snuff  . Alcohol use 0.6 oz/week    1 Standard drinks or equivalent per week     Comment: occasional beer     Allergies   Patient has no known allergies.   Review of Systems Review of Systems All other systems negative unless otherwise stated in HPI   Physical Exam Updated Vital Signs BP 123/59   Pulse 69   Temp 98 F (36.7 C) (Oral)   Resp 18   Ht 5' 8" (1.727 m)   Wt 66.2 kg   SpO2 95%   BMI 22.20 kg/m   Physical Exam  Constitutional: She is oriented to person, place, and time. She appears well-developed and well-nourished.  Non-toxic appearance. She does not have a sickly appearance. She does not appear ill.  HENT:  Head: Normocephalic and atraumatic.  Mouth/Throat: Oropharynx is clear and moist.  Eyes: Conjunctivae are normal.  Neck: Normal range of motion. Neck supple.  No cervical midline tenderness.   Cardiovascular: Normal rate and regular rhythm.   Pulses:      Dorsalis pedis pulses are 2+ on the right side, and 2+ on the left side.  Pulmonary/Chest: Effort normal and breath sounds normal. No accessory muscle usage or stridor. No respiratory distress. She has no wheezes. She has no rhonchi. She has no rales.  Abdominal: Soft. Bowel sounds are normal. She exhibits no distension. There is no tenderness.  Musculoskeletal: Normal range of motion. She exhibits tenderness.  Pelvis stable. Right hip shortened and externally rotated.   Lymphadenopathy:    She has no cervical adenopathy.  Neurological: She is alert and oriented to person, place, and time.  Sensation intact to light touch.   Skin: Skin is warm and dry.  Psychiatric: She has a normal mood and affect. Her behavior is normal.     ED Treatments / Results  Labs (all labs ordered are listed, but only abnormal results  are displayed) Labs Reviewed  CBC WITH DIFFERENTIAL/PLATELET - Abnormal; Notable for the following:       Result Value   WBC 11.8 (*)    Neutro Abs 10.2 (*)    All other components within normal limits  BASIC METABOLIC PANEL - Abnormal; Notable for the following:    Glucose, Bld 208 (*)    BUN 21 (*)    Creatinine, Ser 1.34 (*)    GFR calc non Af Amer 37 (*)    GFR calc Af Amer 43 (*)    All other components within normal limits    EKG  EKG Interpretation  Date/Time:  Monday January 25 2016 10:49:33 EST Ventricular Rate:  72 PR Interval:  QRS Duration: 94 QT Interval:  433 QTC Calculation: 474 R Axis:   66 Text Interpretation:  Sinus rhythm Normal ECG No significant change since last tracing Confirmed by KNOTT MD, DANIEL [phone]) on 01/25/2016 10:59:17 AM       Radiology Dg Chest 1 View  Result Date: 01/25/2016 CLINICAL DATA:  Pain following fall EXAM: CHEST 1 VIEW COMPARISON:  November 07, 2015 FINDINGS: There is slight atelectasis in the left base. Lungs elsewhere clear. Heart size and pulmonary vascularity are normal. No adenopathy. Bones are osteoporotic. There is atherosclerotic calcification in the aorta. There is calcification in the right carotid artery. IMPRESSION: Slight left base atelectasis. No edema or consolidation. No pneumothorax. Aortic and right carotid artery calcifications/atherosclerosis. Electronically Signed   By: Lowella Grip III M.D.   On: 01/25/2016 12:09   Dg Hip Unilat W Or Wo Pelvis 2-3 Views Right  Result Date: 01/25/2016 CLINICAL DATA:  Pain following fall EXAM: DG HIP (WITH OR WITHOUT PELVIS) 2-3V RIGHT COMPARISON:  None. FINDINGS: Frontal pelvis as well as frontal and lateral right hip images were obtained. There is a subcapital femoral neck fracture on the right with mild displacement of fracture fragments. No other fracture evident. No dislocation. There is mild symmetric narrowing of both hip joints. Bones are diffusely osteoporotic. There  is also degenerative change in the lower lumbar spine. IMPRESSION: Mildly displaced subcapital femoral neck fracture. No dislocation. Bones osteoporotic. Electronically Signed   By: Lowella Grip III M.D.   On: 01/25/2016 12:08    Procedures Procedures (including critical care time)  Medications Ordered in ED Medications  sodium chloride 0.9 % bolus 500 mL (not administered)  fentaNYL (SUBLIMAZE) injection 50 mcg (50 mcg Intravenous Given 01/25/16 1120)  morphine 4 MG/ML injection 4 mg (4 mg Intravenous Given 01/25/16 1223)     Initial Impression / Assessment and Plan / ED Course  I have reviewed the triage vital signs and the nursing notes.  Pertinent labs & imaging results that were available during my care of the patient were reviewed by me and considered in my medical decision making (see chart for details).     Patient presents for evaluation of right hip pain status post mechanical fall from standing. No other signs of trauma aside from right hip which is externally rotated. She has good distal pulses. Plain films show mildly displaced subcapital femoral neck fracture. Spoke with orthopedic surgery, plan for surgery tomorrow. Patient will be made nothing by mouth at midnight. Labs without acute abnormalities. Patient will need admission for further management. Appreciate TRH.   Case has been discussed with and seen by Dr. Laneta Simmers who agrees with the above plan for admission.   Final Clinical Impressions(s) / ED Diagnoses   Final diagnoses:  Fall, initial encounter  Closed fracture of right hip, initial encounter [facility])    New Prescriptions New Prescriptions   No medications on file     Gloriann Loan, PA-C 01/25/16 1253    Leo Grosser, MD 01/25/16 [phone]

## 2016-01-25 NOTE — ED Notes (Signed)
Patient is stable and ready to be transport to the floor at this time.  Report was called to 5W RN.  Belongings taken with the patient to the floor.   

## 2016-01-25 NOTE — ED Notes (Signed)
Pt.'s had peanut butter crackers last night around 21:00  Pt. Drank some water this am.

## 2016-01-26 ENCOUNTER — Encounter (HOSPITAL_COMMUNITY): Payer: Self-pay | Admitting: Anesthesiology

## 2016-01-26 ENCOUNTER — Inpatient Hospital Stay (HOSPITAL_COMMUNITY): Payer: Medicare Other | Admitting: Anesthesiology

## 2016-01-26 ENCOUNTER — Encounter (HOSPITAL_COMMUNITY)
Admission: EM | Disposition: A | Discharge status: Home Health | Payer: Self-pay | Source: Home / Self Care | Attending: Internal Medicine

## 2016-01-26 ENCOUNTER — Inpatient Hospital Stay (HOSPITAL_COMMUNITY): Payer: Medicare Other

## 2016-01-26 DIAGNOSIS — F418 Other specified anxiety disorders: Secondary | ICD-10-CM

## 2016-01-26 DIAGNOSIS — I10 Essential (primary) hypertension: Secondary | ICD-10-CM

## 2016-01-26 DIAGNOSIS — E1165 Type 2 diabetes mellitus with hyperglycemia: Secondary | ICD-10-CM

## 2016-01-26 DIAGNOSIS — S72001D Fracture of unspecified part of neck of right femur, subsequent encounter for closed fracture with routine healing: Secondary | ICD-10-CM

## 2016-01-26 DIAGNOSIS — Z794 Long term (current) use of insulin: Secondary | ICD-10-CM

## 2016-01-26 HISTORY — PX: HIP ARTHROPLASTY: SHX981

## 2016-01-26 LAB — BASIC METABOLIC PANEL
ANION GAP: 5 (ref 5–15)
BUN: 17 mg/dL (ref 6–20)
CALCIUM: 9.1 mg/dL (ref 8.9–10.3)
CO2: 24 mmol/L (ref 22–32)
Chloride: 108 mmol/L (ref 101–111)
Creatinine, Ser: 1.3 mg/dL — ABNORMAL HIGH (ref 0.44–1.00)
GFR calc Af Amer: 44 mL/min — ABNORMAL LOW (ref >60)
GFR calc non Af Amer: 38 mL/min — ABNORMAL LOW (ref >60)
GLUCOSE: 182 mg/dL — AB (ref 65–99)
Potassium: 4.4 mmol/L (ref 3.5–5.1)
Sodium: 137 mmol/L (ref 135–145)

## 2016-01-26 LAB — PROTIME-INR
INR: 1.12
PROTHROMBIN TIME: 14.4 s (ref 11.4–15.2)

## 2016-01-26 LAB — SURGICAL PCR SCREEN
MRSA, PCR: NEGATIVE
Staphylococcus aureus: POSITIVE — AB

## 2016-01-26 LAB — CBC
HCT: 35.7 % — ABNORMAL LOW (ref 36.0–46.0)
Hemoglobin: 11.7 g/dL — ABNORMAL LOW (ref 12.0–15.0)
MCH: 29.8 pg (ref 26.0–34.0)
MCHC: 32.8 g/dL (ref 30.0–36.0)
MCV: 91.1 fL (ref 78.0–100.0)
PLATELETS: 174 10*3/uL (ref 150–400)
RBC: 3.92 MIL/uL (ref 3.87–5.11)
RDW: 12.7 % (ref 11.5–15.5)
WBC: 10 10*3/uL (ref 4.0–10.5)

## 2016-01-26 LAB — HEMOGLOBIN A1C
Hgb A1c MFr Bld: 8.6 % — ABNORMAL HIGH (ref 4.8–5.6)
MEAN PLASMA GLUCOSE: 200 mg/dL

## 2016-01-26 LAB — GLUCOSE, CAPILLARY
GLUCOSE-CAPILLARY: 171 mg/dL — AB (ref 65–99)
GLUCOSE-CAPILLARY: 101 mg/dL — AB (ref 65–99)
GLUCOSE-CAPILLARY: 135 mg/dL — AB (ref 65–99)
Glucose-Capillary: 170 mg/dL — ABNORMAL HIGH (ref 65–99)
Glucose-Capillary: 176 mg/dL — ABNORMAL HIGH (ref 65–99)
Glucose-Capillary: 193 mg/dL — ABNORMAL HIGH (ref 65–99)

## 2016-01-26 SURGERY — HEMIARTHROPLASTY, HIP, DIRECT ANTERIOR APPROACH, FOR FRACTURE
Anesthesia: General | Site: Hip | Laterality: Right

## 2016-01-26 MED ORDER — 0.9 % SODIUM CHLORIDE (POUR BTL) OPTIME
TOPICAL | Status: DC | PRN
  Administered 2016-01-26: 1000 mL

## 2016-01-26 MED ORDER — ONDANSETRON HCL 4 MG/2ML IJ SOLN
INTRAMUSCULAR | Status: DC | PRN
  Administered 2016-01-26: 4 mg via INTRAVENOUS

## 2016-01-26 MED ORDER — ONDANSETRON HCL 4 MG PO TABS: 4.0000 mg | ORAL_TABLET | Freq: Three times a day (TID) | ORAL | 0 refills | Status: DC | PRN

## 2016-01-26 MED ORDER — ONDANSETRON HCL 4 MG/2ML IJ SOLN
INTRAMUSCULAR | Status: AC
  Filled 2016-01-26: qty 2

## 2016-01-26 MED ORDER — HYDROCODONE-ACETAMINOPHEN 5-325 MG PO TABS: 1.0000 | ORAL_TABLET | ORAL | 0 refills | Status: DC | PRN

## 2016-01-26 MED ORDER — CEFAZOLIN IN D5W 1 GM/50ML IV SOLN
1.0000 g | Freq: Four times a day (QID) | INTRAVENOUS | Status: AC
  Administered 2016-01-26 – 2016-01-27 (×3): 1 g via INTRAVENOUS
  Filled 2016-01-26 (×3): qty 50

## 2016-01-26 MED ORDER — OXYCODONE HCL 5 MG PO TABS: 5.0000 mg | ORAL_TABLET | Freq: Once | ORAL | Status: DC | PRN

## 2016-01-26 MED ORDER — FENTANYL CITRATE (PF) 100 MCG/2ML IJ SOLN
INTRAMUSCULAR | Status: AC
  Filled 2016-01-26: qty 2

## 2016-01-26 MED ORDER — SODIUM CHLORIDE 0.9 % IJ SOLN
INTRAMUSCULAR | Status: DC | PRN
  Administered 2016-01-26: 30 mL

## 2016-01-26 MED ORDER — LIDOCAINE HCL (CARDIAC) 20 MG/ML IV SOLN
INTRAVENOUS | Status: DC | PRN
  Administered 2016-01-26: 60 mg via INTRAVENOUS

## 2016-01-26 MED ORDER — ROCURONIUM BROMIDE 100 MG/10ML IV SOLN
INTRAVENOUS | Status: DC | PRN
  Administered 2016-01-26: 40 mg via INTRAVENOUS

## 2016-01-26 MED ORDER — OXYCODONE HCL 5 MG/5ML PO SOLN: 5.0000 mg | Freq: Once | ORAL | Status: DC | PRN

## 2016-01-26 MED ORDER — NEOSTIGMINE METHYLSULFATE 5 MG/5ML IV SOSY
PREFILLED_SYRINGE | INTRAVENOUS | Status: AC
  Filled 2016-01-26: qty 5

## 2016-01-26 MED ORDER — PHENYLEPHRINE HCL 10 MG/ML IJ SOLN
INTRAMUSCULAR | Status: DC | PRN
  Administered 2016-01-26: 40 ug via INTRAVENOUS
  Administered 2016-01-26: 80 ug via INTRAVENOUS

## 2016-01-26 MED ORDER — ROCURONIUM BROMIDE 50 MG/5ML IV SOSY
PREFILLED_SYRINGE | INTRAVENOUS | Status: AC
  Filled 2016-01-26: qty 5

## 2016-01-26 MED ORDER — FENTANYL CITRATE (PF) 100 MCG/2ML IJ SOLN
INTRAMUSCULAR | Status: DC | PRN
  Administered 2016-01-26: 50 ug via INTRAVENOUS
  Administered 2016-01-26: 25 ug via INTRAVENOUS
  Administered 2016-01-26 (×2): 50 ug via INTRAVENOUS
  Administered 2016-01-26: 25 ug via INTRAVENOUS

## 2016-01-26 MED ORDER — KETOROLAC TROMETHAMINE 30 MG/ML IJ SOLN
INTRAMUSCULAR | Status: DC | PRN
  Administered 2016-01-26: 30 mg

## 2016-01-26 MED ORDER — BUPIVACAINE HCL (PF) 0.25 % IJ SOLN
INTRAMUSCULAR | Status: DC | PRN
  Administered 2016-01-26: 30 mL

## 2016-01-26 MED ORDER — DOCUSATE SODIUM 100 MG PO CAPS: 100.0000 mg | ORAL_CAPSULE | Freq: Two times a day (BID) | ORAL | 0 refills | Status: DC

## 2016-01-26 MED ORDER — PROPOFOL 10 MG/ML IV BOLUS
INTRAVENOUS | Status: DC | PRN
  Administered 2016-01-26: 10 mg via INTRAVENOUS
  Administered 2016-01-26: 20 mg via INTRAVENOUS
  Administered 2016-01-26: 110 mg via INTRAVENOUS

## 2016-01-26 MED ORDER — LACTATED RINGERS IV SOLN
INTRAVENOUS | Status: DC
  Administered 2016-01-26 (×3): via INTRAVENOUS

## 2016-01-26 MED ORDER — ONDANSETRON HCL 4 MG/2ML IJ SOLN
4.0000 mg | Freq: Four times a day (QID) | INTRAMUSCULAR | Status: DC | PRN
  Administered 2016-01-26: 4 mg via INTRAVENOUS
  Filled 2016-01-26: qty 2

## 2016-01-26 MED ORDER — GLYCOPYRROLATE 0.2 MG/ML IJ SOLN
INTRAMUSCULAR | Status: DC | PRN
  Administered 2016-01-26: 0.4 mg via INTRAVENOUS
  Administered 2016-01-26: 0.2 mg via INTRAVENOUS

## 2016-01-26 MED ORDER — ENOXAPARIN SODIUM 30 MG/0.3ML ~~LOC~~ SOLN: 30.0000 mg | SUBCUTANEOUS | Status: DC

## 2016-01-26 MED ORDER — BACLOFEN 10 MG PO TABS: 10.0000 mg | ORAL_TABLET | Freq: Three times a day (TID) | ORAL | 0 refills | Status: DC | PRN

## 2016-01-26 MED ORDER — FENTANYL CITRATE (PF) 100 MCG/2ML IJ SOLN: 25.0000 ug | INTRAMUSCULAR | Status: DC | PRN

## 2016-01-26 MED ORDER — TRANEXAMIC ACID 1000 MG/10ML IV SOLN
1000.0000 mg | INTRAVENOUS | Status: AC
  Administered 2016-01-26: 1000 mg via INTRAVENOUS
  Filled 2016-01-26: qty 10

## 2016-01-26 MED ORDER — NEOSTIGMINE METHYLSULFATE 10 MG/10ML IV SOLN
INTRAVENOUS | Status: DC | PRN
  Administered 2016-01-26: 3 mg via INTRAVENOUS

## 2016-01-26 MED ORDER — KETOROLAC TROMETHAMINE 30 MG/ML IJ SOLN
INTRAMUSCULAR | Status: AC
  Filled 2016-01-26: qty 1

## 2016-01-26 MED ORDER — ONDANSETRON HCL 4 MG/2ML IJ SOLN
4.0000 mg | Freq: Four times a day (QID) | INTRAMUSCULAR | Status: AC | PRN
  Administered 2016-01-26: 4 mg via INTRAVENOUS

## 2016-01-26 MED ORDER — LIDOCAINE 2% (20 MG/ML) 5 ML SYRINGE
INTRAMUSCULAR | Status: AC
  Filled 2016-01-26: qty 5

## 2016-01-26 MED ORDER — ENOXAPARIN SODIUM 40 MG/0.4ML ~~LOC~~ SOLN: 30.0000 mg | SUBCUTANEOUS | 0 refills | Status: DC

## 2016-01-26 MED ORDER — SUGAMMADEX SODIUM 200 MG/2ML IV SOLN
INTRAVENOUS | Status: AC
  Filled 2016-01-26: qty 2

## 2016-01-26 MED ORDER — PROPOFOL 10 MG/ML IV BOLUS
INTRAVENOUS | Status: AC
  Filled 2016-01-26: qty 40

## 2016-01-26 MED ORDER — HYDROCODONE-ACETAMINOPHEN 5-325 MG PO TABS
1.0000 | ORAL_TABLET | ORAL | Status: DC | PRN
  Administered 2016-01-26 – 2016-01-28 (×4): 2 via ORAL
  Administered 2016-01-28: 1 via ORAL
  Filled 2016-01-26: qty 1
  Filled 2016-01-26 (×4): qty 2

## 2016-01-26 SURGICAL SUPPLY — 43 items
BIT DRILL 7/64X5 DISP (BIT) ×2 IMPLANT
BLADE SAGITTAL 25.0X1.27X90 (BLADE) ×2 IMPLANT
CAPT HIP HEMI 2 ×2 IMPLANT
CLSR STERI-STRIP ANTIMIC 1/2X4 (GAUZE/BANDAGES/DRESSINGS) ×3 IMPLANT
COVER SURGICAL LIGHT HANDLE (MISCELLANEOUS) ×2 IMPLANT
DRAPE ORTHO SPLIT 77X108 STRL (DRAPES) ×4
DRAPE SURG ORHT 6 SPLT 77X108 (DRAPES) ×2 IMPLANT
DRAPE U-SHAPE 47X51 STRL (DRAPES) ×2 IMPLANT
DRSG AQUACEL AG ADV 3.5X10 (GAUZE/BANDAGES/DRESSINGS) ×1 IMPLANT
DRSG MEPILEX BORDER 4X8 (GAUZE/BANDAGES/DRESSINGS) ×2 IMPLANT
DURAPREP 26ML APPLICATOR (WOUND CARE) ×2 IMPLANT
ELECT BLADE 4.0 EZ CLEAN MEGAD (MISCELLANEOUS) ×2
ELECT CAUTERY BLADE 6.4 (BLADE) ×2 IMPLANT
ELECT REM PT RETURN 9FT ADLT (ELECTROSURGICAL) ×2
ELECTRODE BLDE 4.0 EZ CLN MEGD (MISCELLANEOUS) ×1 IMPLANT
ELECTRODE REM PT RTRN 9FT ADLT (ELECTROSURGICAL) ×1 IMPLANT
FACESHIELD WRAPAROUND (MASK) ×2 IMPLANT
FACESHIELD WRAPAROUND OR TEAM (MASK) ×1 IMPLANT
GLOVE BIO SURGEON STRL SZ7.5 (GLOVE) ×4 IMPLANT
GLOVE BIOGEL PI IND STRL 8 (GLOVE) ×2 IMPLANT
GLOVE BIOGEL PI INDICATOR 8 (GLOVE) ×2
GOWN STRL REUS W/ TWL LRG LVL3 (GOWN DISPOSABLE) ×2 IMPLANT
GOWN STRL REUS W/ TWL XL LVL3 (GOWN DISPOSABLE) ×1 IMPLANT
GOWN STRL REUS W/TWL LRG LVL3 (GOWN DISPOSABLE) ×4
GOWN STRL REUS W/TWL XL LVL3 (GOWN DISPOSABLE) ×2
HIP CAPITATED HEMI 2 IMPLANT
KIT BASIN OR (CUSTOM PROCEDURE TRAY) ×2 IMPLANT
KIT ROOM TURNOVER OR (KITS) ×2 IMPLANT
MANIFOLD NEPTUNE II (INSTRUMENTS) ×2 IMPLANT
NS IRRIG 1000ML POUR BTL (IV SOLUTION) ×2 IMPLANT
PACK TOTAL JOINT (CUSTOM PROCEDURE TRAY) ×2 IMPLANT
PAD ARMBOARD 7.5X6 YLW CONV (MISCELLANEOUS) ×4 IMPLANT
PILLOW ABDUCTION HIP (SOFTGOODS) ×2 IMPLANT
RETRIEVER SUT HEWSON (MISCELLANEOUS) ×2 IMPLANT
SUT FIBERWIRE #2 38 REV NDL BL (SUTURE) ×4
SUT MNCRL AB 4-0 PS2 18 (SUTURE) ×2 IMPLANT
SUT MON AB 2-0 CT1 36 (SUTURE) ×2 IMPLANT
SUT VIC AB 1 CT1 27 (SUTURE) ×2
SUT VIC AB 1 CT1 27XBRD ANBCTR (SUTURE) ×1 IMPLANT
SUTURE FIBERWR#2 38 REV NDL BL (SUTURE) ×2 IMPLANT
TOWEL OR 17X24 6PK STRL BLUE (TOWEL DISPOSABLE) ×2 IMPLANT
TOWEL OR 17X26 10 PK STRL BLUE (TOWEL DISPOSABLE) ×2 IMPLANT
TRAY FOLEY CATH 14FR (SET/KITS/TRAYS/PACK) IMPLANT

## 2016-01-26 NOTE — Transfer of Care (Signed)
Immediate Anesthesia Transfer of Care Note  Patient: Jessica Meyer  Procedure(s) Performed: Procedure(s): ARTHROPLASTY BIPOLAR HIP (HEMIARTHROPLASTY) (Right)  Patient Location: PACU  Anesthesia Type:General  Level of Consciousness: awake, alert , oriented and sedated  Airway & Oxygen Therapy: Patient Spontanous Breathing and Patient connected to nasal cannula oxygen  Post-op Assessment: Report given to RN, Post -op Vital signs reviewed and stable and Patient moving all extremities  Post vital signs: Reviewed and stable  Last Vitals:  Vitals:   01/26/16 0911 01/26/16 1500  BP: (!) 113/53 (!) 144/70  Pulse: (!) 57 79  Resp:  14  Temp:  36.4 C    Last Pain:  Vitals:   01/26/16 0937  TempSrc:   PainSc: 7       Patients Stated Pain Goal: 4 (01/26/16 0706)  Complications: No apparent anesthesia complications

## 2016-01-26 NOTE — Anesthesia Procedure Notes (Addendum)
Procedure Name: Intubation Date/Time: 01/26/2016 12:59 PM Performed by: Fransisca KaufmannMEYER, Sahalie Beth E Pre-anesthesia Checklist: Patient identified, Emergency Drugs available, Suction available and Patient being monitored Patient Re-evaluated:Patient Re-evaluated prior to inductionOxygen Delivery Method: Circle System Utilized Preoxygenation: Pre-oxygenation with 100% oxygen Intubation Type: IV induction Ventilation: Mask ventilation without difficulty Laryngoscope Size: Miller and 2 Grade View: Grade I Tube type: Oral Tube size: 7.5 mm Number of attempts: 1 Airway Equipment and Method: Stylet and Oral airway Placement Confirmation: ETT inserted through vocal cords under direct vision,  positive ETCO2 and breath sounds checked- equal and bilateral Secured at: 22 cm Tube secured with: Tape Dental Injury: Teeth and Oropharynx as per pre-operative assessment

## 2016-01-26 NOTE — Anesthesia Postprocedure Evaluation (Signed)
Anesthesia Post Note  Patient: Jessica Meyer  Procedure(s) Performed: Procedure(s) (LRB): ARTHROPLASTY BIPOLAR HIP (HEMIARTHROPLASTY) (Right)  Patient location during evaluation: PACU Anesthesia Type: General Level of consciousness: awake and alert and oriented Pain management: pain level controlled Vital Signs Assessment: post-procedure vital signs reviewed and stable Respiratory status: spontaneous breathing, nonlabored ventilation, respiratory function stable and patient connected to nasal cannula oxygen Cardiovascular status: blood pressure returned to baseline and stable Postop Assessment: no signs of nausea or vomiting Anesthetic complications: no       Last Vitals:  Vitals:   01/26/16 1545 01/26/16 1600  BP: (!) 133/54 (!) 134/53  Pulse: 60 60  Resp: 13 13  Temp:      Last Pain:  Vitals:   01/26/16 1600  TempSrc:   PainSc: 0-No pain        RLE Motor Response: Purposeful movement;Responds to commands (01/26/16 1600) RLE Sensation: Full sensation (01/26/16 1600)      Gail Vendetti A.

## 2016-01-26 NOTE — Progress Notes (Signed)
PROGRESS NOTE                                                                                                                                                                                                             Patient Demographics:    Jessica ForestDoris Meyer, is a 81 y.o. female, DOB - 10/05/1935, BJY:[id number]  Admit date - 01/25/2016   Admitting Physician Ozella Rocksavid J Merrell, MD  Outpatient Primary MD for the patient is Lemont Fillers'SULLIVAN,MELISSA S., NP  LOS - 1    Chief Complaint  Patient presents with  . Fall  . Hip Pain       Brief Narrative   81 y/o female DM, HTN, HLD, hypothyroidism, anxiety, depression, brought to the emergency department after sustaining a mechanical fall as she was walking in her then, tripping in landing on her right hip. Sustained Mildly displaced subcapital femoral neck fracture. No LOC.   Subjective:   C/o pain over rt hip   Assessment  & Plan :    Principal Problem:   Closed right hip fracture (HCC) Hip fracture pathway initiated. Orthopedics consulted and pt scheduled for surgery.  EKG reviewed and stable. Patient's underlying chronic medical issues are well controlled and patient is quite active at baseline. She is at most moderate cardiac risk for surgery. She is already on beta blocker at home which is continued perioperatively. Does not need further preop cardiac workup. PT post op. Pain control and DVT prophylaxis per ortho. Add bowel regimen. Pt wishes to go home with home health if possible. SW consult.   Active Problems:    Diabetes type 2, controlled (HCC) uncontrolled . A1C of 8.6. Monitor on SSI. Continue bedtime lantus     Essential (primary) hypertension Stable. Continue home meds  CKD stage 3 Renal function at baseline    Depression with anxiety Resume home meds  hypothyroidism  continue synthroid   1.   Code Status : full code  Family Communication  : son and  daughter at bedside  Disposition Plan  : pending post op recomvery  Barriers For Discharge : OR  Consults  :   Orthopedics ( Dr Eulah PontMurphy)   Procedures  : none  DVT Prophylaxis  :  Lovenox -   Lab Results  Component Value Date   PLT 174 01/26/2016    Antibiotics  :  Anti-infectives    Start     Dose/Rate Route Frequency Ordered Stop   01/26/16 0800  ceFAZolin (ANCEF) IVPB 2g/100 mL premix     2 g 200 mL/hr over 30 Minutes Intravenous To [facility] 01/25/16 2355 01/27/16 0800        Objective:   Vitals:   01/25/16 2016 01/26/16 0059 01/26/16 0448 01/26/16 0911  BP: (!) 122/59 (!) 92/44 (!) 103/47 (!) 113/53  Pulse: 71 63 63 (!) 57  Resp: 20 16 16    Temp: 98.9 F (37.2 C) 99.4 F (37.4 C) 99.1 F (37.3 C)   TempSrc: Oral Oral Oral   SpO2: 96% 91% 91%   Weight:      Height:        Wt Readings from Last 3 Encounters:  01/25/16 66.2 kg (146 lb)  01/11/16 66.2 kg (146 lb)  11/11/15 67.8 kg (149 lb 6.4 oz)     Intake/Output Summary (Last 24 hours) at 01/26/16 1210 Last data filed at 01/26/16 2440  Gross per 24 hour  Intake          1005.83 ml  Output              400 ml  Net           605.83 ml     Physical Exam  Gen: not in distress HEENT: moist mucosa, supple neck Chest: clear b/l, no added sounds CVS: N S1&S2, no murmurs,  GI: soft, NT, ND, BS+ Musculoskeletal: warm, no edema, limited mobility of rt hip    Data Review:    CBC  Recent Labs Lab 01/25/16 1120 01/26/16 0645  WBC 11.8* 10.0  HGB 13.4 11.7*  HCT 40.4 35.7*  PLT 207 174  MCV 90.4 91.1  MCH 30.0 29.8  MCHC 33.2 32.8  RDW 12.2 12.7  LYMPHSABS 0.9  --   MONOABS 0.6  --   EOSABS 0.1  --   BASOSABS 0.0  --     Chemistries   Recent Labs Lab 01/25/16 1120 01/26/16 0645  NA 140 137  K 4.4 4.4  CL 106 108  CO2 26 24  GLUCOSE 208* 182*  BUN 21* 17  CREATININE 1.34* 1.30*  CALCIUM 9.5 9.1    ------------------------------------------------------------------------------------------------------------------ No results for input(s): CHOL, HDL, LDLCALC, TRIG, CHOLHDL, LDLDIRECT in the last 72 hours.  Lab Results  Component Value Date   HGBA1C 8.6 (H) 01/25/2016   ------------------------------------------------------------------------------------------------------------------ No results for input(s): TSH, T4TOTAL, T3FREE, THYROIDAB in the last 72 hours.  Invalid input(s): FREET3 ------------------------------------------------------------------------------------------------------------------ No results for input(s): VITAMINB12, FOLATE, FERRITIN, TIBC, IRON, RETICCTPCT in the last 72 hours.  Coagulation profile  Recent Labs Lab 01/26/16 0645  INR 1.12    No results for input(s): DDIMER in the last 72 hours.  Cardiac Enzymes No results for input(s): CKMB, TROPONINI, MYOGLOBIN in the last 168 hours.  Invalid input(s): CK ------------------------------------------------------------------------------------------------------------------ No results found for: BNP  Inpatient Medications  Scheduled Meds: . [MAR Hold] amLODipine  5 mg Oral Daily  . [MAR Hold] aspirin EC  81 mg Oral Daily  . [MAR Hold] atorvastatin  10 mg Oral QHS  . [MAR Hold] buPROPion  100 mg Oral BID  . [MAR Hold] carvedilol  12.5 mg Oral BID WC  .  ceFAZolin (ANCEF) IV  2 g Intravenous To SS-Surg  . [MAR Hold] insulin aspart  0-9 Units Subcutaneous TID WC  . [MAR Hold] insulin glargine  14 Units Subcutaneous QHS  . [MAR Hold] levothyroxine  62.5  mcg Oral QAC breakfast  . [MAR Hold] lisinopril  40 mg Oral Daily   Continuous Infusions: . sodium chloride 50 mL/hr at 01/25/16 2040  . lactated ringers 10 mL/hr at 01/26/16 1154   PRN Meds:.[MAR Hold] acetaminophen, [MAR Hold] bisacodyl, [MAR Hold] HYDROcodone-acetaminophen, [MAR Hold] hydrOXYzine, [MAR Hold]  morphine injection, [MAR Hold]  senna-docusate, [MAR Hold] sodium phosphate  Micro Results Recent Results (from the past 240 hour(s))  Surgical pcr screen     Status: Abnormal   Collection Time: 01/26/16  1:48 AM  Result Value Ref Range Status   MRSA, PCR NEGATIVE NEGATIVE Final   Staphylococcus aureus POSITIVE (A) NEGATIVE Final    Comment:        The Xpert SA Assay (FDA approved for NASAL specimens in patients over 63 years of age), is one component of a comprehensive surveillance program.  Test performance has been validated by [facility] for patients greater than or equal to 44 year old. It is not intended to diagnose infection nor to guide or monitor treatment.     Radiology Reports Dg Chest 1 View  Result Date: 01/25/2016 CLINICAL DATA:  Pain following fall EXAM: CHEST 1 VIEW COMPARISON:  November 07, 2015 FINDINGS: There is slight atelectasis in the left base. Lungs elsewhere clear. Heart size and pulmonary vascularity are normal. No adenopathy. Bones are osteoporotic. There is atherosclerotic calcification in the aorta. There is calcification in the right carotid artery. IMPRESSION: Slight left base atelectasis. No edema or consolidation. No pneumothorax. Aortic and right carotid artery calcifications/atherosclerosis. Electronically Signed   By: Bretta Bang III M.D.   On: 01/25/2016 12:09   Dg Hip Unilat W Or Wo Pelvis 2-3 Views Right  Result Date: 01/25/2016 CLINICAL DATA:  Pain following fall EXAM: DG HIP (WITH OR WITHOUT PELVIS) 2-3V RIGHT COMPARISON:  None. FINDINGS: Frontal pelvis as well as frontal and lateral right hip images were obtained. There is a subcapital femoral neck fracture on the right with mild displacement of fracture fragments. No other fracture evident. No dislocation. There is mild symmetric narrowing of both hip joints. Bones are diffusely osteoporotic. There is also degenerative change in the lower lumbar spine. IMPRESSION: Mildly displaced subcapital femoral neck fracture. No  dislocation. Bones osteoporotic. Electronically Signed   By: Bretta Bang III M.D.   On: 01/25/2016 12:08    Time Spent in minutes  25   Eddie North M.D on 01/26/2016 at 12:10 PM  Between 7am to 7pm - Pager - [phone]  After 7pm go to [email or web address] - password [facility]  [facility] -  Office  [phone]

## 2016-01-26 NOTE — Interval H&P Note (Signed)
History and Physical Interval Note:  01/26/2016 12:26 PM  Jessica Meyer  has presented today for surgery, with the diagnosis of right hip fracture  The various methods of treatment have been discussed with the patient and family. After consideration of risks, benefits and other options for treatment, the patient has consented to  Procedure(s): ARTHROPLASTY BIPOLAR HIP (HEMIARTHROPLASTY) (Right) as a surgical intervention .  The patient's history has been reviewed, patient examined, no change in status, stable for surgery.  I have reviewed the patient's chart and labs.  Questions were answered to the patient's satisfaction.     MURPHY, TIMOTHY D

## 2016-01-26 NOTE — Progress Notes (Signed)
PT Cancellation Note  Patient Details Name: Jessica SaxonDoris Ann Landau MRN: [id number] DOB: 10/31/1935   Cancelled Treatment:     Pt out of room for surgery this pm.  Will therefore perform physical therapy evaluation tomorrow.  Stephanie AcreKristen M NemahaSoth, PT [phone]  Arriyanna Mersch 01/26/2016, 2:49 PM

## 2016-01-26 NOTE — Progress Notes (Signed)
Inpatient Diabetes Program Recommendations  AACE/ADA: New Consensus Statement on Inpatient Glycemic Control (2015)  Target Ranges:  Prepandial:   less than 140 mg/dL      Peak postprandial:   less than 180 mg/dL (1-2 hours)      Critically ill patients:  140 - 180 mg/dL   Lab Results  Component Value Date   GLUCAP 171 (H) 01/26/2016   HGBA1C 8.6 (H) 01/25/2016    Review of Glycemic Control:  Results for Jessica Meyer, Jessica Meyer (MRN [id number]) as of 01/26/2016 09:30  Ref. Range 01/25/2016 18:26 01/25/2016 20:25 01/26/2016 08:03 01/26/2016 08:37  Glucose-Capillary Latest Ref Range: 65 - 99 mg/dL 401302 (H) 027245 (H) 253170 (H) 171 (H)    Diabetes history: Type 2 diabetes Outpatient Diabetes medications: Lantus 14 units daily-takes at lunch Current orders for Inpatient glycemic control:  Lantus 14 units q HS, Novolog sensitive tid with meals  Inpatient Diabetes Program Recommendations:    Spoke briefly with patient regarding diabetes and glycemic goals <180 mg/dL.  She states that she has been on insulin for the past 3 years.  Discussed use of rapid acting insulin for increased blood sugars and the effects of stress on blood sugar levels.  Fasting blood sugar improved this morning. Please consider increasing frequency of Novolog correction to q 4 hours while NPO for surgery.   Will follow.  Thanks, Beryl MeagerJenny Kojo Liby, RN, BC-ADM Inpatient Diabetes Coordinator Pager [phone] (8a-5p)

## 2016-01-26 NOTE — Progress Notes (Signed)
OT Cancellation Note  Patient Details Name: Jessica SaxonDoris Ann Meyer MRN: [id number] DOB: 06/19/1935   Cancelled Treatment:    Reason Eval/Treat Not Completed: Patient not medically ready (pending surg 01/26/16)  Felecia ShellingJones, Ezri Fanguy B  Cristiano Capri, Brynn   OTR/L Pager: [phone] Office: [phone] .  01/26/2016, 7:35 AM

## 2016-01-26 NOTE — Progress Notes (Signed)
NURSING PROGRESS NOTE  Cassie FreerDoris Ann AllenMRN: [id number] Admission Data: 01/25/2016 at 8:15PM Attending Provider: Ozella Rocksavid J Merrell, MD PCP: Lemont Fillers'SULLIVAN,MELISSA S., NP Code status: Full  Allergies: No Known Allergies  Past Medical History:  Past Medical History:  Diagnosis Date  . Arthritis   . Diabetes mellitus without complication (HCC)   . History of chicken pox   . Hyperlipidemia   . Hypertension   . Thyroid disease    hypothyroid   Past Surgical History:  Past Surgical History:  Procedure Laterality Date  . ABDOMINAL HYSTERECTOMY    . KNEE ARTHROSCOPY Bilateral   . REPLACEMENT TOTAL KNEE BILATERAL     1998 left , 1992 right   Donnel SaxonDoris Ann Meyer is a 81 y.o. female patient, arrived to floor in room 5W14 via stretcher, transferred from ED. Patient alert and oriented X 4. No acute distress noted. Complains of pain 10/10 right hip pain.   Vital signs: Oral temperature 98.9 F (37.2 C), Blood pressure 122/59, Pulse 71, RR 20, SpO2 96 % on room air.   IV access: Right wrist; condition patent and no redness. Normal saline infusing at 2550mL/hr  Skin: intact, no pressure ulcer noted in sacral area. Abrasions noted on right and left elbows and left foot and leg.  Patient's ID armband verified with patient/ family, and in place. Information packet given to patient/ family. Fall risk assessed, SR up X2, patient/ family able to verbalize understanding of risks associated with falls and to call nurse or staff to assist before getting out of bed. Patient/ family oriented to room and equipment. Call bell within reach.

## 2016-01-26 NOTE — H&P (View-Only) (Signed)
ORTHOPAEDIC CONSULTATION  REQUESTING PHYSICIAN: Waldemar Dickens, MD  Chief Complaint: Right hip pain and inability to bear weight after mechanical fall.  Assessment / plan: Active Problems:   ASNHL (asymmetrical sensorineural hearing loss)   Diabetes type 2, controlled (HCC)   Essential (primary) hypertension   Depression with anxiety   Hyperlipidemia   Closed right hip fracture (HCC)   Leukocytosis  Plan for right hip hemiarthroplasty tomorrow. -NPO at midnight -Medicine team to admit and perform pre-op clearance -PT/OT post op -will ammend WB status postop, bedrest for now -Foley for comfort prn to be removed POD 1/2 -Patient hoping to return home with home health PT. She lives alone. However, she may require Rehab or SNF placement upon discharge.  -VTE prophylaxis: SCDs.  The risks benefits and alternatives of the procedure were discussed with the patient including but not limited to infection, bleeding, nerve injury, the need for revision surgery, blood clots, cardiopulmonary complications, morbidity, mortality, among others.  The patient verbalizes understanding and wishes to proceed.    HPI: Jessica Meyer is a 81 y.o. female who complains of right hip pain after a fall this morning. She denies dizziness, loss of consciousness. She believes that she tripped. No history of DVT, PE, MI, or CVA. Not on anticoagulation.  XR shows subcapital femoral neck fracture.  Orthopedics was consulted for evaluation.    Past Medical History:  Diagnosis Date  . Arthritis   . Diabetes mellitus without complication ([place])   . History of chicken pox   . Hyperlipidemia   . Hypertension   . Thyroid disease    hypothyroid   Past Surgical History:  Procedure Laterality Date  . ABDOMINAL HYSTERECTOMY    . KNEE ARTHROSCOPY Bilateral   . REPLACEMENT TOTAL KNEE BILATERAL     1998 left , 1992 right   Social History   Social History  . Marital status: Single    Spouse name: N/A    . Number of children: N/A  . Years of education: N/A   Social History Main Topics  . Smoking status: Never Smoker  . Smokeless tobacco: Current User    Types: Snuff  . Alcohol use 0.6 oz/week    1 Standard drinks or equivalent per week     Comment: occasional beer  . Drug use: No  . Sexual activity: Not Asked   Other Topics Concern  . None   Social History Narrative   Retired Financial controller   Completed 7th grade   2 daughter   [name] and daughter. Daughter local, son in [place]   4 grandchildren   Enjoys puzzles, gardening.      Family History  Problem Relation Age of Onset  . Arthritis Mother   . Depression Mother   . Heart attack Father     106   . Hypertension Father    No Known Allergies Prior to Admission medications   Medication Sig Start Date End Date Taking? Authorizing Provider  amLODipine (NORVASC) 5 MG tablet TAKE ONE TABLET BY MOUTH DAILY 07/27/15  Yes Debbrah Alar, NP  aspirin EC 81 MG tablet Take 81 mg by mouth daily.   Yes Historical Provider, MD  atorvastatin (LIPITOR) 10 MG tablet Take 1 tablet (10 mg total) by mouth at bedtime. 01/13/15  Yes Debbrah Alar, NP  B-D UF III MINI PEN NEEDLES 31G X 5 MM MISC USE DAILY WITH LANTUS SOLOSTAR PENS 11/16/15  Yes Debbrah Alar, NP  Blood Glucose Monitoring Suppl (  ACCU-CHEK AVIVA PLUS) w/Device KIT Use to check blood sugar once a day.  DX E11.9 11/24/15  Yes Debbrah Alar, NP  buPROPion (WELLBUTRIN) 100 MG tablet TAKE 1 TABLET  BY MOUTH 2 TIMES DAILY. 11/02/15  Yes Debbrah Alar, NP  carvedilol (COREG) 12.5 MG tablet TAKE ONE TABLET BY MOUTH TWICE A DAY WITH A MEAL 11/03/15  Yes Debbrah Alar, NP  glucose blood (ACCU-CHEK AVIVA PLUS) test strip Use as instructed to check blood sugar twice a day.  DX E11.9 11/24/15  Yes Debbrah Alar, NP  hydrOXYzine (ATARAX/VISTARIL) 25 MG tablet TAKE ONE TABLET BY MOUTH TWICE A DAY AS NEEDED FOR ANXIETY 09/15/15  Yes Debbrah Alar, NP  Lancet  Devices [facility]) lancets Use to check blood sugar twice a day.  DX E11.9 12/03/15  Yes Debbrah Alar, NP  LANTUS SOLOSTAR 100 UNIT/ML Solostar Pen INJECT 14 UNITS INTO THE SKIN BEFORE LUNCH 01/22/16  Yes Debbrah Alar, NP  levothyroxine (SYNTHROID, LEVOTHROID) 125 MCG tablet Take 0.5 tablets (62.5 mcg total) by mouth daily before breakfast. 09/16/15  Yes Debbrah Alar, NP  lisinopril (PRINIVIL,ZESTRIL) 40 MG tablet TAKE ONE TABLET BY MOUTH DAILY 12/30/15  Yes Debbrah Alar, NP  ibuprofen (ADVIL,MOTRIN) 600 MG tablet Take 1 tablet (600 mg total) by mouth every 6 (six) hours as needed. Patient not taking: Reported on 01/25/2016 05/07/15   Marella Chimes, PA-C   Dg Chest 1 View  Result Date: 01/25/2016 CLINICAL DATA:  Pain following fall EXAM: CHEST 1 VIEW COMPARISON:  November 07, 2015 FINDINGS: There is slight atelectasis in the left base. Lungs elsewhere clear. Heart size and pulmonary vascularity are normal. No adenopathy. Bones are osteoporotic. There is atherosclerotic calcification in the aorta. There is calcification in the right carotid artery. IMPRESSION: Slight left base atelectasis. No edema or consolidation. No pneumothorax. Aortic and right carotid artery calcifications/atherosclerosis. Electronically Signed   By: Lowella Grip III M.D.   On: 01/25/2016 12:09   Dg Hip Unilat W Or Wo Pelvis 2-3 Views Right  Result Date: 01/25/2016 CLINICAL DATA:  Pain following fall EXAM: DG HIP (WITH OR WITHOUT PELVIS) 2-3V RIGHT COMPARISON:  None. FINDINGS: Frontal pelvis as well as frontal and lateral right hip images were obtained. There is a subcapital femoral neck fracture on the right with mild displacement of fracture fragments. No other fracture evident. No dislocation. There is mild symmetric narrowing of both hip joints. Bones are diffusely osteoporotic. There is also degenerative change in the lower lumbar spine. IMPRESSION: Mildly displaced subcapital femoral  neck fracture. No dislocation. Bones osteoporotic. Electronically Signed   By: Lowella Grip III M.D.   On: 01/25/2016 12:08    Positive ROS: All other systems have been reviewed and were otherwise negative with the exception of those mentioned in the HPI and as above.  Objective: Labs cbc  Recent Labs  01/25/16 1120  WBC 11.8*  HGB 13.4  HCT 40.4  PLT 207     Recent Labs  01/25/16 1120  NA 140  K 4.4  CL 106  CO2 26  GLUCOSE 208*  BUN 21*  CREATININE 1.34*  CALCIUM 9.5    Physical Exam: Vitals:   01/25/16 1500 01/25/16 1530  BP: (!) 111/54 108/59  Pulse: 64 65  Resp: 20 16  Temp:     General: Alert, no acute distress.  Family in room Mental status: Alert and Oriented x3 Neurologic: Speech Clear and organized, no gross focal findings or movement disorder appreciated. Respiratory: No cyanosis, no use of  accessory musculature Cardiovascular: No pedal edema GI: Abdomen is soft and non-tender, non-distended. Skin: Warm and dry.  No lesions in the area of chief complaint.  B/L elbows w/ superficial abrasion - bandage in place. Extremities: Warm and well perfused w/o edema Psychiatric: Patient is competent for consent with normal mood and affect  MUSCULOSKELETAL:  Right hip pain with range of motion. Neurovascular intact distally. EHL, FHL, dorsiflexion, plantar flexion intact. Other extremities are atraumatic with painless ROM and NVI.   Prudencio Burly III, PA-C 01/25/2016 4:53 PM

## 2016-01-26 NOTE — Op Note (Signed)
01/25/2016 - 01/26/2016  2:07 PM  PATIENT:  Jessica Meyer   MRN: [id number]  PRE-OPERATIVE DIAGNOSIS:  right hip fracture  POST-OPERATIVE DIAGNOSIS:  right hip fracture  PROCEDURE:  Procedure(s): ARTHROPLASTY BIPOLAR HIP (HEMIARTHROPLASTY)  PREOPERATIVE INDICATIONS:  Jessica Meyer is an 81 y.o. female who was admitted 01/25/2016 with a diagnosis of Closed right hip fracture [facility]) and elected for surgical management.  The risks benefits and alternatives were discussed with the patient including but not limited to the risks of nonoperative treatment, versus surgical intervention including infection, bleeding, nerve injury, periprosthetic fracture, the need for revision surgery, dislocation, leg length discrepancy, blood clots, cardiopulmonary complications, morbidity, mortality, among others, and they were willing to proceed.  Predicted outcome is good, although there will be at least a six to nine month expected recovery.   OPERATIVE REPORT     SURGEON:  Edmonia Lynch, MD    ASSISTANT:  Roxan Hockey, PA-C, he was present and scrubbed throughout the case, critical for completion in a timely fashion, and for retraction, instrumentation, and closure.     ANESTHESIA:  General    COMPLICATIONS:  None.      COMPONENTS:  [employer] Acolade: Femoral stem: 4, Femoral Head:48, Neck:0   PROCEDURE IN DETAIL: The patient was met in the holding area and identified.  The appropriate hip  was marked at the operative site. The patient was then transported to the OR and  placed under general anesthesia.  At that point, the patient was  placed in the lateral decubitus position with the operative side up and  secured to the operating room table and all bony prominences padded.     The operative lower extremity was prepped from the iliac crest to the toes.  Sterile draping was performed.  Time out was performed prior to incision.      A routine posterolateral approach was utilized via sharp dissection   carried down to the subcutaneous tissue.  Gross bleeders were Bovie  coagulated.  The iliotibial band was identified and incised  along the length of the skin incision.  Self-retaining retractors were  inserted. I examined the bursa there was significant hematoma and edema I performed a bursectomy here.  With the hip internally rotated, the short external rotators  were identified. The piriformis was tagged with FiberWire, and the hip capsule released in a T-type fashion.  The femoral neck was exposed, and I resected the femoral neck using the appropriate jig. This was performed at approximately a thumb's breadth above the lesser trochanter.    I then exposed the deep acetabulum, cleared out any tissue including the ligamentum teres, and included the hip capsule in the FiberWire used above and below the T.    I then prepared the proximal femur using the cookie-cutter, the lateralizing reamer, and then sequentially broached.  A trial utilized, and I reduced the hip and it was found to have excellent stability with functional range of motion. The trial components were then removed.   The canal and acetabulum were thoroughly irrigated  I inserted the pressfit stem and placed the head and neck collar. The hip was reduced with appropriate force and was stable through a range of motion.   I then used a 2 mm drill bits to pass the FiberWire suture from the capsule and puriform is through the greater trochanter, and secured this. Excellent posterior capsular repair was achieved. I also closed the T in the capsule.  I then irrigated the hip copiously again with  pulse lavage, and repaired the fascia with Vicryl, followed by Vicryl for the subcutaneous tissue, Monocryl for the skin, Steri-Strips and sterile gauze. The wounds were injected. The patient was then awakened and returned to PACU in stable and satisfactory condition. There were no complications.  POST-OP PLAN: Weight bearing as tolerated. DVT px  will consist of SCD's and chemical px.   Edmonia Lynch, MD Orthopedic Surgeon [phone]   01/26/2016 2:07 PM

## 2016-01-26 NOTE — Anesthesia Preprocedure Evaluation (Signed)
Anesthesia Evaluation  Patient identified by MRN, date of birth, ID band Patient awake    Reviewed: Allergy & Precautions, H&P , NPO status , Patient's Chart, lab work & pertinent test results  Airway Mallampati: II   Neck ROM: full    Dental   Pulmonary neg pulmonary ROS,    breath sounds clear to auscultation       Cardiovascular hypertension,  Rhythm:regular Rate:Normal     Neuro/Psych  Neuromuscular disease    GI/Hepatic   Endo/Other  diabetes, Type 2Hypothyroidism   Renal/GU Renal InsufficiencyRenal disease     Musculoskeletal  (+) Arthritis ,   Abdominal   Peds  Hematology   Anesthesia Other Findings   Reproductive/Obstetrics                             Anesthesia Physical Anesthesia Plan  ASA: II  Anesthesia Plan: General   Post-op Pain Management:    Induction: Intravenous  Airway Management Planned: Oral ETT  Additional Equipment:   Intra-op Plan:   Post-operative Plan: Extubation in OR  Informed Consent: I have reviewed the patients History and Physical, chart, labs and discussed the procedure including the risks, benefits and alternatives for the proposed anesthesia with the patient or authorized representative who has indicated his/her understanding and acceptance.     Plan Discussed with: CRNA, Anesthesiologist and Surgeon  Anesthesia Plan Comments:         Anesthesia Quick Evaluation

## 2016-01-27 ENCOUNTER — Encounter (HOSPITAL_COMMUNITY): Payer: Self-pay | Admitting: Orthopedic Surgery

## 2016-01-27 DIAGNOSIS — E1365 Other specified diabetes mellitus with hyperglycemia: Secondary | ICD-10-CM

## 2016-01-27 LAB — GLUCOSE, CAPILLARY
GLUCOSE-CAPILLARY: 117 mg/dL — AB (ref 65–99)
GLUCOSE-CAPILLARY: 214 mg/dL — AB (ref 65–99)
Glucose-Capillary: 88 mg/dL (ref 65–99)
Glucose-Capillary: 194 mg/dL — ABNORMAL HIGH (ref 65–99)

## 2016-01-27 LAB — CBC
HEMATOCRIT: 31.7 % — AB (ref 36.0–46.0)
Hemoglobin: 10.5 g/dL — ABNORMAL LOW (ref 12.0–15.0)
MCH: 30.2 pg (ref 26.0–34.0)
MCHC: 33.1 g/dL (ref 30.0–36.0)
MCV: 91.1 fL (ref 78.0–100.0)
Platelets: 160 10*3/uL (ref 150–400)
RBC: 3.48 MIL/uL — AB (ref 3.87–5.11)
RDW: 12.6 % (ref 11.5–15.5)
WBC: 11.5 10*3/uL — AB (ref 4.0–10.5)

## 2016-01-27 MED ORDER — MUPIROCIN 2 % EX OINT: TOPICAL_OINTMENT | Freq: Two times a day (BID) | CUTANEOUS | Status: DC

## 2016-01-27 MED ORDER — ALUM & MAG HYDROXIDE-SIMETH 200-200-20 MG/5ML PO SUSP
15.0000 mL | Freq: Four times a day (QID) | ORAL | Status: DC | PRN
  Administered 2016-01-27 – 2016-01-28 (×2): 15 mL via ORAL
  Filled 2016-01-27 (×2): qty 30

## 2016-01-27 MED ORDER — MUPIROCIN 2 % EX OINT
1.0000 | TOPICAL_OINTMENT | Freq: Two times a day (BID) | CUTANEOUS | Status: DC
Start: 2016-01-27 — End: 2016-01-29
  Administered 2016-01-27 – 2016-01-29 (×5): 1 via NASAL
  Filled 2016-01-27 (×3): qty 22

## 2016-01-27 MED ORDER — CHLORHEXIDINE GLUCONATE CLOTH 2 % EX PADS
6.0000 | MEDICATED_PAD | Freq: Every day | CUTANEOUS | Status: DC
  Administered 2016-01-27: 6 via TOPICAL

## 2016-01-27 MED ORDER — ENOXAPARIN SODIUM 40 MG/0.4ML ~~LOC~~ SOLN
40.0000 mg | SUBCUTANEOUS | Status: DC
  Administered 2016-01-27 – 2016-01-28 (×2): 40 mg via SUBCUTANEOUS
  Filled 2016-01-27 (×2): qty 0.4

## 2016-01-27 MED ORDER — CHLORHEXIDINE GLUCONATE CLOTH 2 % EX PADS
6.0000 | MEDICATED_PAD | Freq: Every day | CUTANEOUS | Status: DC
  Administered 2016-01-27 – 2016-01-29 (×3): 6 via TOPICAL

## 2016-01-27 MED ORDER — ENOXAPARIN SODIUM 40 MG/0.4ML ~~LOC~~ SOLN: 40.0000 mg | SUBCUTANEOUS | 0 refills | Status: DC

## 2016-01-27 NOTE — Progress Notes (Signed)
Removed

## 2016-01-27 NOTE — Care Management Note (Addendum)
Case Management Note  Patient Details  Name: Jessica Meyer MRN: [id number] Date of Birth: 03/28/1935  Subjective/Objective:                 Spoke with patient and daughter Jessica Meyer at bedside. Patient states that she wants to go home at DC. Daughter encouraged her to consider CIR, however patient states "she is a home person." CM discussed HH provider options, [facility] preferred. [facility]. CM discussed DME needs. Identified RW 3in1 and tub bench. [facility] Patient and daughter could not confirm address for disposition, patient may or may not stay at daughter's house. CM will continue to follow.    Action/Plan:  Please call Renee RN CM Ortho at [phone] on discharge day. Pt will [facility]  [facility] and RW tub bench and 3 in 1 to be delivered to room prior to DC.   Expected Discharge Date:                  Expected Discharge Plan:  Home/Self Care  In-House Referral:     Discharge planning Services  CM Consult  Post Acute Care Choice:  Home Health, Durable Medical Equipment Choice offered to:  Patient, Adult Children  DME Arranged:  3-N-1, Tub bench, Walker rolling DME Agency:  [facility]  [facility]  [facility]  Status of Service:  In process, will continue to follow  If discussed at Long Length of Stay Meetings, dates discussed:    Additional Comments:  Jessica SabalDebbie Odai Wimmer, RN 01/27/2016, 11:55 AM

## 2016-01-27 NOTE — Progress Notes (Signed)
PROGRESS NOTE                                                                                                                                                                                                             Patient Demographics:    Jessica Meyer, is a 81 y.o. female, DOB - [birth year], NWG:[id number]  Admit date - 01/25/2016   Admitting Physician Ozella Rocks, MD  Outpatient Primary MD for the patient is Lemont Fillers., NP  LOS - 2    Chief Complaint  Patient presents with  . Fall  . Hip Pain       Brief Narrative   81 y/o female DM, HTN, HLD, hypothyroidism, anxiety, depression, brought to the emergency department after sustaining a mechanical fall as she was walking in her then, tripping in landing on her right hip. Sustained Mildly displaced subcapital femoral neck fracture. No LOC.   Subjective:   Right hip pain stable on pain medications.   Assessment  & Plan :    Principal Problem:   Closed right hip fracture (HCC) S/p -right hip hemiarthroplasty on 1/23. Tolerated well. Up with therapy. Foley discontinued. Seen by PT this morning. Recommends home with 24-hour supervision (patient insists on going home and does not want to go to rehabilitation). Pain control with when necessary Vicodin. Daily Lovenox injection for DVT prophylaxis. (If patient is discharged home will discuss with orthopedics on an alternative DVT prophylaxis option).    Active Problems:    Diabetes type 2, controlled (HCC) uncontrolled . A1C of 8.6. Monitor on SSI. Continue bedtime lantus     Essential (primary) hypertension Stable. Continue home meds  CKD stage 3 Renal function at baseline    Depression with anxiety Resume home meds  hypothyroidism  continue synthroid   1.   Code Status : full code  Family Communication  :  Son at bedside  Disposition Plan  : Home with services on 1/25  Barriers  For Discharge : post op  Consults  :   Orthopedics ( Dr Eulah Pont)   Procedures  :  Right hip hemiarthroplasty  DVT Prophylaxis  :  Lovenox -   Lab Results  Component Value Date   PLT 160 01/27/2016    Antibiotics  :    Anti-infectives    Start     Dose/Rate Route  Frequency Ordered Stop   01/26/16 1900  ceFAZolin (ANCEF) IVPB 1 g/50 mL premix     1 g 100 mL/hr over 30 Minutes Intravenous Every 6 hours 01/26/16 1632 01/27/16 0710   01/26/16 0800  ceFAZolin (ANCEF) IVPB 2g/100 mL premix     2 g 200 mL/hr over 30 Minutes Intravenous To [facility] 01/25/16 2355 01/26/16 1302        Objective:   Vitals:   01/27/16 0530 01/27/16 0547 01/27/16 0821 01/27/16 0822  BP: (!) 114/42  (!) 122/45 (!) 122/45  Pulse: 74  70 70  Resp: 16     Temp: 99.5 F (37.5 C)     TempSrc: Oral     SpO2: (!) 87% 97%    Weight:      Height:        Wt Readings from Last 3 Encounters:  01/25/16 66.2 kg (146 lb)  01/11/16 66.2 kg (146 lb)  11/11/15 67.8 kg (149 lb 6.4 oz)     Intake/Output Summary (Last 24 hours) at 01/27/16 1152 Last data filed at 01/27/16 0835  Gross per 24 hour  Intake          2315.83 ml  Output              950 ml  Net          1365.83 ml     Physical Exam  Gen: not in distress HEENT: moist mucosa, supple neck Chest: clear b/l, no added sounds CVS: N S1&S2, no murmurs,  GI: soft, NT, ND,  Musculoskeletal: rt hip with clean dressing    Data Review:    CBC  Recent Labs Lab 01/25/16 1120 01/26/16 0645 01/27/16 0559  WBC 11.8* 10.0 11.5*  HGB 13.4 11.7* 10.5*  HCT 40.4 35.7* 31.7*  PLT 207 174 160  MCV 90.4 91.1 91.1  MCH 30.0 29.8 30.2  MCHC 33.2 32.8 33.1  RDW 12.2 12.7 12.6  LYMPHSABS 0.9  --   --   MONOABS 0.6  --   --   EOSABS 0.1  --   --   BASOSABS 0.0  --   --     Chemistries   Recent Labs Lab 01/25/16 1120 01/26/16 0645  NA 140 137  K 4.4 4.4  CL 106 108  CO2 26 24  GLUCOSE 208* 182*  BUN 21* 17  CREATININE 1.34*  1.30*  CALCIUM 9.5 9.1   ------------------------------------------------------------------------------------------------------------------ No results for input(s): CHOL, HDL, LDLCALC, TRIG, CHOLHDL, LDLDIRECT in the last 72 hours.  Lab Results  Component Value Date   HGBA1C 8.6 (H) 01/25/2016   ------------------------------------------------------------------------------------------------------------------ No results for input(s): TSH, T4TOTAL, T3FREE, THYROIDAB in the last 72 hours.  Invalid input(s): FREET3 ------------------------------------------------------------------------------------------------------------------ No results for input(s): VITAMINB12, FOLATE, FERRITIN, TIBC, IRON, RETICCTPCT in the last 72 hours.  Coagulation profile  Recent Labs Lab 01/26/16 0645  INR 1.12    No results for input(s): DDIMER in the last 72 hours.  Cardiac Enzymes No results for input(s): CKMB, TROPONINI, MYOGLOBIN in the last 168 hours.  Invalid input(s): CK ------------------------------------------------------------------------------------------------------------------ No results found for: BNP  Inpatient Medications  Scheduled Meds: . amLODipine  5 mg Oral Daily  . atorvastatin  10 mg Oral QHS  . buPROPion  100 mg Oral BID  . carvedilol  12.5 mg Oral BID WC  . Chlorhexidine Gluconate Cloth  6 each Topical Q0600  . enoxaparin (LOVENOX) injection  40 mg Subcutaneous Q24H  . insulin aspart  0-9 Units Subcutaneous TID WC  .  insulin glargine  14 Units Subcutaneous QHS  . levothyroxine  62.5 mcg Oral QAC breakfast  . lisinopril  40 mg Oral Daily  . mupirocin ointment  1 application Nasal BID   Continuous Infusions: . sodium chloride 50 mL/hr at 01/26/16 1639   PRN Meds:.acetaminophen, alum & mag hydroxide-simeth, bisacodyl, HYDROcodone-acetaminophen, hydrOXYzine, morphine injection, ondansetron (ZOFRAN) IV, senna-docusate, sodium phosphate  Micro Results Recent Results  (from the past 240 hour(s))  Surgical pcr screen     Status: Abnormal   Collection Time: 01/26/16  1:48 AM  Result Value Ref Range Status   MRSA, PCR NEGATIVE NEGATIVE Final   Staphylococcus aureus POSITIVE (A) NEGATIVE Final    Comment:        The Xpert SA Assay (FDA approved for NASAL specimens in patients over 79 years of age), is one component of a comprehensive surveillance program.  Test performance has been validated by [facility] for patients greater than or equal to 79 year old. It is not intended to diagnose infection nor to guide or monitor treatment.     Radiology Reports Dg Chest 1 View  Result Date: 01/25/2016 CLINICAL DATA:  Pain following fall EXAM: CHEST 1 VIEW COMPARISON:  November 07, 2015 FINDINGS: There is slight atelectasis in the left base. Lungs elsewhere clear. Heart size and pulmonary vascularity are normal. No adenopathy. Bones are osteoporotic. There is atherosclerotic calcification in the aorta. There is calcification in the right carotid artery. IMPRESSION: Slight left base atelectasis. No edema or consolidation. No pneumothorax. Aortic and right carotid artery calcifications/atherosclerosis. Electronically Signed   By: Bretta BangWilliam  Woodruff III M.D.   On: 01/25/2016 12:09   Dg Hip Port Unilat With Pelvis 1v Right  Result Date: 01/26/2016 CLINICAL DATA:  Right hip fracture, repair EXAM: DG HIP (WITH OR WITHOUT PELVIS) 1V PORT RIGHT COMPARISON:  01/25/2016 FINDINGS: Changes of right hip replacement. Normal alignment. No hardware or bony complicating feature. IMPRESSION: Right hip replacement.  No complicating feature. Electronically Signed   By: Charlett NoseKevin  Dover M.D.   On: 01/26/2016 15:58   Dg Hip Unilat W Or Wo Pelvis 2-3 Views Right  Result Date: 01/25/2016 CLINICAL DATA:  Pain following fall EXAM: DG HIP (WITH OR WITHOUT PELVIS) 2-3V RIGHT COMPARISON:  None. FINDINGS: Frontal pelvis as well as frontal and lateral right hip images were obtained. There is a  subcapital femoral neck fracture on the right with mild displacement of fracture fragments. No other fracture evident. No dislocation. There is mild symmetric narrowing of both hip joints. Bones are diffusely osteoporotic. There is also degenerative change in the lower lumbar spine. IMPRESSION: Mildly displaced subcapital femoral neck fracture. No dislocation. Bones osteoporotic. Electronically Signed   By: Bretta BangWilliam  Woodruff III M.D.   On: 01/25/2016 12:08    Time Spent in minutes  25   Eddie NorthHUNGEL, Mattis Featherly M.D on 01/27/2016 at 11:52 AM  Between 7am to 7pm - Pager - [phone]  After 7pm go to [email or web address] - password [email or web address]  [facility] -  Office  [phone]

## 2016-01-27 NOTE — Evaluation (Signed)
Occupational Therapy Evaluation Patient Details Name: Jessica Meyer MRN: [id number] DOB: [birth year] Today's Date: 01/27/2016    History of Present Illness 81 y.o. female with a history of DM, HTN, HLD, hypothyroidism, anxiety, depression, brought to the emergency department after sustaining a mechanical fall as she was walking in her then, tripping in landing on her right hip. She denies any prior similar events. No LOC. S/p R hemi hip arthoplasty   Clinical Impression   Pt with decline in function and safety with ADLs and ADL mobility with decreased balance, endurance and cognition. Pt requires mutimodal cues for safety/sequencing and unable to recall hip precautions. Pt would benefit from acute OT services to address impairments to increase level of function and safety    Follow Up Recommendations  Home health OT;Supervision/Assistance - 24 hour    Equipment Recommendations  3 in 1 bedside commode;Tub/shower bench;Other (comment) (ADL hip kit)    Recommendations for Other Services       Precautions / Restrictions Precautions Precautions: Fall;Posterior Hip Precaution Comments: Pt unable to recall. Requires mod cues Required Braces or Orthoses:  (abduction pilow in bed) Restrictions Weight Bearing Restrictions: No RLE Weight Bearing: Weight bearing as tolerated      Mobility Bed Mobility Overal bed mobility: Needs Assistance Bed Mobility: Rolling;Supine to Sit Rolling: Min assist   Supine to sit: Min assist;HOB elevated     General bed mobility comments: pt up in recliner  Transfers Overall transfer level: Needs assistance Equipment used: Rolling walker (2 wheeled) Transfers: Sit to/from [employer] Sit to Stand: Mod assist;Min assist Stand pivot transfers: Min assist       General transfer comment: multimodal cues fro postioning and correct hand placement, maintaining precautions    Balance Overall balance assessment: Needs  assistance;History of Falls Sitting-balance support: Single extremity supported;No upper extremity supported;Feet supported Sitting balance-Leahy Scale: Good     Standing balance support: Bilateral upper extremity supported;During functional activity Standing balance-Leahy Scale: Fair                              ADL Overall ADL's : Needs assistance/impaired     Grooming: Wash/dry hands;Wash/dry face;Minimal assistance;Standing;Cueing for safety;Cueing for sequencing   Upper Body Bathing: Set up;Sitting   Lower Body Bathing: Moderate assistance;Cueing for safety;With caregiver independent assisting   Upper Body Dressing : Set up;Sitting   Lower Body Dressing: Maximal assistance;With caregiver independent assisting;Cueing for safety   Toilet Transfer: Minimal assistance;Moderate assistance;Stand-pivot;RW;Ambulation (simulated to recliner)   Toileting- Clothing Manipulation and Hygiene: Moderate assistance       Functional mobility during ADLs: Minimal assistance;Moderate assistance General ADL Comments: Educatd pt and family on use of DME and A/D for homw use     Vision Vision Assessment?: No apparent visual deficits              Pertinent Vitals/Pain Pain Assessment: Faces Faces Pain Scale: Hurts a little bit Pain Location: R hip Pain Descriptors / Indicators: Aching;Sore Pain Intervention(s): Monitored during session;Premedicated before session;Repositioned     Hand Dominance Right   Extremity/Trunk Assessment Upper Extremity Assessment Upper Extremity Assessment: Overall WFL for tasks assessed   Lower Extremity Assessment Lower Extremity Assessment: Defer to PT evaluation RLE Deficits / Details: post op pain and weakness; grossly 3/5   Cervical / Trunk Assessment Cervical / Trunk Assessment: Kyphotic   Communication Communication Communication: HOH   Cognition Arousal/Alertness: Awake/alert Behavior During Therapy: Impulsive Overall  Cognitive Status:  Impaired/Different from baseline Area of Impairment: Attention;Memory;Following commands;Safety/judgement;Awareness     Memory: Decreased recall of precautions;Decreased short-term memory Following Commands: Follows multi-step commands inconsistently Safety/Judgement: Decreased awareness of safety     General Comments: Pt tangential and scattered. Requires redirection. Poor carryover of precautions   General Comments   pt pleasant and cooperative, family supportive                 Home Living Family/patient expects to be discharged to:: Private residence Living Arrangements: Alone Available Help at Discharge: Family;Friend(s) Type of Home: House Home Access: Stairs to enter [employer] of Steps: 3 Entrance Stairs-Rails: Right Home Layout: One level     Bathroom Shower/Tub: Teacher, early years/pre: [place]: None   Additional Comments: Discussed recommendation for 24/7 supervision at this time due to adherence to precautions and overall safety due to high fall risk. Son and daughter present and verbalized understanding. If unable to provide, would recommend short term rehab      Prior Functioning/Environment Level of Independence: Independent                 OT Problem List: Impaired balance (sitting and/or standing);Decreased knowledge of use of DME or AE;Decreased safety awareness;Decreased knowledge of precautions;Pain;Decreased activity tolerance;Decreased cognition   OT Treatment/Interventions: Self-care/ADL training;DME and/or AE instruction;Therapeutic activities;Patient/family education    OT Goals(Current goals can be found in the care plan section) Acute Rehab OT Goals Patient Stated Goal: go home OT Goal Formulation: With patient/family Time For Goal Achievement: 02/03/16 Potential to Achieve Goals: Good ADL Goals Pt Will Perform Grooming: with min guard assist;with  supervision;standing;with caregiver independent in assisting Pt Will Perform Lower Body Bathing: with min assist;with caregiver independent in assisting Pt Will Perform Lower Body Dressing: with mod assist;with caregiver independent in assisting Pt Will Transfer to Toilet: with min assist;with min guard assist;ambulating Pt Will Perform Toileting - Clothing Manipulation and hygiene: with min assist;with min guard assist;sit to/from stand;with caregiver independent in assisting Pt Will Perform Tub/Shower Transfer: with min assist;tub bench;ambulating;rolling walker;with caregiver independent in assisting Additional ADL Goal #1: Pt will recall all hip precautions and maintain during ADLs and ADL mobility  OT Frequency: Min 2X/week   Barriers to D/C: Decreased caregiver support  pt lives at home alone, however wants to return home; son and daughter planing for family to provide 24 hour assist if at all possible                     End of Session Equipment Utilized During Treatment: Gait belt;Rolling walker  Activity Tolerance: Patient tolerated treatment well Patient left: in chair;with family/visitor present;with call bell/phone within reach   Time: 1146-1209 OT Time Calculation (min): 23 min Charges:  OT General Charges $OT Visit: 1 Procedure OT Evaluation $OT Eval Moderate Complexity: 1 Procedure OT Treatments $Therapeutic Activity: 8-22 mins G-Codes:    Britt Bottom 01/27/2016, 2:18 PM

## 2016-01-27 NOTE — Evaluation (Signed)
Physical Therapy Evaluation Patient Details Name: Jessica Meyer MRN: [id number] DOB: [birth year] Today's Date: 01/27/2016   History of Present Illness  81 y.o. female with a history of DM, HTN, HLD, hypothyroidism, anxiety, depression, brought to the emergency department after sustaining a mechanical fall as she was walking in her then, tripping in landing on her right hip. She denies any prior similar events. No LOC. S/p R hemi hip arthoplasty  Clinical Impression  Pt admitted with above diagnosis. Pt currently with functional limitations due to the deficits listed below (see PT Problem List). Pt currently min to mod assist overall with RW for transfers and short distance gait. Pt requires mod verbal cues for carry over of precautions and increased fall risk. Discussed recommendation of 24/7 supervision upon discharge initially and son verbalized understanding. Pt will benefit from skilled PT to increase their independence and safety with mobility to allow discharge to the venue listed below.       Follow Up Recommendations Home health PT;Supervision/Assistance - 24 hour    Equipment Recommendations  Rolling walker with 5" wheels    Recommendations for Other Services       Precautions / Restrictions Precautions Precautions: Fall;Posterior Hip Precaution Comments: Pt unable to recall. Requires mod cues Required Braces or Orthoses:  (abduction pilow in bed) Restrictions Weight Bearing Restrictions: No RLE Weight Bearing: Weight bearing as tolerated      Mobility  Bed Mobility Overal bed mobility: Needs Assistance Bed Mobility: Rolling;Supine to Sit Rolling: Min assist   Supine to sit: Min assist;HOB elevated     General bed mobility comments: Pt used bed rails for support. CUes for technique and used pillow between knees to maintain precautions.   Transfers Overall transfer level: Needs assistance Equipment used: Rolling walker (2 wheeled) Transfers: Sit to/from  [employer] Sit to Stand: Mod assist Stand pivot transfers: Min assist       General transfer comment: Cues for attention to R foot positioning during turns to maintain precautions; Overall min assist for balance.   Ambulation/Gait Ambulation/Gait assistance: Min assist Ambulation Distance (Feet): 30 Feet Assistive device: Rolling walker (2 wheeled) Gait Pattern/deviations: Step-to pattern;Decreased stance time - right;Antalgic        Stairs            Wheelchair Mobility    Modified Rankin (Stroke Patients Only)       Balance Overall balance assessment: Needs assistance;History of Falls Sitting-balance support: Single extremity supported;No upper extremity supported;Feet supported Sitting balance-Leahy Scale: Good     Standing balance support: Bilateral upper extremity supported;During functional activity Standing balance-Leahy Scale: Fair                               Pertinent Vitals/Pain Pain Assessment: Faces Faces Pain Scale: Hurts a little bit Pain Location: R hip Pain Descriptors / Indicators: Sore Pain Intervention(s): Monitored during session;Premedicated before session    Home Living Family/patient expects to be discharged to:: Private residence Living Arrangements: Alone Available Help at Discharge: Family;Friend(s) (trying to determine if they can provide 24/7; was intermitte) Type of Home: House Home Access: Stairs to enter Entrance Stairs-Rails: Right Entrance Stairs-Number of Steps: 3 Home Layout: One level   Additional Comments: Discussed recommendation for 24/7 supervision at this time due to adherence to precautions and overall safety due to high fall risk. Son present and verbalized understanding. If unable to provide, would recommend short term rehab    Prior  Function Level of Independence: Independent               Hand Dominance        Extremity/Trunk Assessment   Upper Extremity  Assessment Upper Extremity Assessment: Defer to OT evaluation    Lower Extremity Assessment Lower Extremity Assessment: RLE deficits/detail RLE Deficits / Details: post op pain and weakness; grossly 3/5    Cervical / Trunk Assessment Cervical / Trunk Assessment: Kyphotic  Communication   Communication: HOH  Cognition Arousal/Alertness: Awake/alert Behavior During Therapy: Impulsive                   General Comments: Pt tangential and scattered. Requires redirection. Poor carryover of precautions    General Comments General comments (skin integrity, edema, etc.): education on precautions and edema control  Pt attempted to use bedpsn during session but not successful. Deferred BSC/toilet transfer until elevated toilet seat could be obtained to be able to maintain hip precautions. Notified RN, NT, and OT.     Exercises     Assessment/Plan    PT Assessment Patient needs continued PT services  PT Problem List Decreased strength;Decreased range of motion;Decreased activity tolerance;Decreased balance;Decreased mobility;Decreased coordination;Decreased cognition;Decreased knowledge of use of DME;Decreased safety awareness;Decreased knowledge of precautions;Pain          PT Treatment Interventions DME instruction;Gait training;Stair training;Functional mobility training;Therapeutic activities;Therapeutic exercise;Balance training;Neuromuscular re-education;Cognitive remediation;Patient/family education;Modalities    PT Goals (Current goals can be found in the Care Plan section)  Acute Rehab PT Goals Patient Stated Goal: go home PT Goal Formulation: With patient/family Time For Goal Achievement: 02/03/16 Potential to Achieve Goals: Good    Frequency Min 4X/week   Barriers to discharge Decreased caregiver support (unclear if 24/7 will be available)      Co-evaluation               End of Session Equipment Utilized During Treatment: Gait belt Activity  Tolerance: Patient tolerated treatment well Patient left: in chair;with call bell/phone within reach;with family/visitor present Nurse Communication: Mobility status;Precautions         Time: [id number] PT Time Calculation (min) (ACUTE ONLY): 42 min   Charges:   PT Evaluation $PT Eval Moderate Complexity: 1 Procedure PT Treatments $Gait Training: 8-22 mins $Therapeutic Activity: 8-22 mins   PT G Codes:        Karolee StampsGray, Jayde Daffin Darrol PokeBrescia  Ronit Marczak B. Dvontae Ruan, PT, DPT Pager #: [phone]  01/27/2016, 10:59 AM

## 2016-01-27 NOTE — Progress Notes (Signed)
   Assessment: 1 Day Post-Op  S/P Procedure(s) (LRB): ARTHROPLASTY BIPOLAR HIP (HEMIARTHROPLASTY) (Right) by Dr. Jewel Baizeimothy D. Eulah PontMurphy on 01/26/16  Principal Problem:   Closed right hip fracture (HCC) Active Problems:   ASNHL (asymmetrical sensorineural hearing loss)   Diabetes type 2, controlled (HCC)   Essential (primary) hypertension   Depression with anxiety   Hyperlipidemia   Leukocytosis Acute Blood loss anemia mild - likely with dilutional component.    Plan: Advance diet Up with therapy  Weight Bearing: Weight Bearing as Tolerated (WBAT)  Posterior hip precautions Dressings: When necessary.  VTE prophylaxis: Lovenox, SCDs, ambulation Dispo: Home.  Home health PT. PT eval pending  Subjective: Patient reports pain as mild. Pain controlled with PO meds.  Tolerating diet.  Urinating.  +Flatus.  No CP, SOB.  Not yet OOB.  Objective:   VITALS:   Vitals:   01/27/16 0112 01/27/16 0530 01/27/16 0547 01/27/16 0821  BP: (!) 106/54 (!) 114/42  (!) 122/45  Pulse: 93 74  70  Resp: 16 16    Temp: 99.4 F (37.4 C) 99.5 F (37.5 C)    TempSrc: Oral Oral    SpO2: 99% (!) 87% 97%   Weight:      Height:       CBC Latest Ref Rng & Units 01/27/2016 01/26/2016 01/25/2016  WBC 4.0 - 10.5 K/uL 11.5(H) 10.0 11.8(H)  Hemoglobin 12.0 - 15.0 g/dL 10.5(L) 11.7(L) 13.4  Hematocrit 36.0 - 46.0 % 31.7(L) 35.7(L) 40.4  Platelets 150 - 400 K/uL 160 174 207   BMP Latest Ref Rng & Units 01/26/2016 01/25/2016 01/15/2016  Glucose 65 - 99 mg/dL 147(W182(H) 295(A208(H) 213(Y224(H)  BUN 6 - 20 mg/dL 17 86(V21(H) 78(I31(H)  Creatinine 0.44 - 1.00 mg/dL 6.96(E1.30(H) 9.52(W1.34(H) 4.13(K1.53(H)  Sodium 135 - 145 mmol/L 137 140 137  Potassium 3.5 - 5.1 mmol/L 4.4 4.4 4.7  Chloride 101 - 111 mmol/L 108 106 104  CO2 22 - 32 mmol/L 24 26 26   Calcium 8.9 - 10.3 mg/dL 9.1 9.5 9.6   Intake/Output      01/23 0701 - 01/24 0700 01/24 0701 - 01/25 0700   P.O. 240    I.V. (mL/kg) 1425.8 (21.5)    Other  500   IV Piggyback 150    Total  Intake(mL/kg) 1815.8 (27.4) 500 (7.6)   Urine (mL/kg/hr) 850 (0.5)    Blood 300 (0.2)    Total Output 1150     Net +665.8 +500          Physical Exam: General: NAD.   Resp: No increased wob Cardio: regular rate and rhythm ABD soft Neurologically intact MSK Neurovascularly intact Sensation intact distally Intact pulses distally Dorsiflexion/Plantar flexion intact Incision: dressing C/D/I  Jessica BilletHenry Calvin Martensen III, PA-C 01/27/2016, 8:36 AM

## 2016-01-28 LAB — GLUCOSE, CAPILLARY
GLUCOSE-CAPILLARY: 152 mg/dL — AB (ref 65–99)
GLUCOSE-CAPILLARY: 222 mg/dL — AB (ref 65–99)
GLUCOSE-CAPILLARY: 221 mg/dL — AB (ref 65–99)
Glucose-Capillary: 199 mg/dL — ABNORMAL HIGH (ref 65–99)

## 2016-01-28 MED ORDER — INSULIN GLARGINE 100 UNIT/ML ~~LOC~~ SOLN
18.0000 [IU] | Freq: Every day | SUBCUTANEOUS | Status: DC
  Administered 2016-01-28: 18 [IU] via SUBCUTANEOUS
  Filled 2016-01-28: qty 0.18

## 2016-01-28 MED ORDER — RIVAROXABAN 10 MG PO TABS
10.0000 mg | ORAL_TABLET | Freq: Every day | ORAL | Status: DC
  Administered 2016-01-29: 10 mg via ORAL
  Filled 2016-01-28: qty 1

## 2016-01-28 NOTE — Progress Notes (Signed)
   Assessment / Plan: 2 Days Post-Op  S/P Procedure(s) (LRB): ARTHROPLASTY BIPOLAR HIP (HEMIARTHROPLASTY) (Right) by Dr. Jewel Baizeimothy D. Eulah PontMurphy on 01/26/16  Principal Problem:   Closed right hip fracture (HCC) Active Problems:   ASNHL (asymmetrical sensorineural hearing loss)   Diabetes type 2, controlled (HCC)   Essential (primary) hypertension   Depression with anxiety   Hyperlipidemia   Leukocytosis Acute Blood loss anemia mild, asymptomatic- likely with dilutional component.    Progressing well from an orthopedic perspective.  Weight Bearing: Weight Bearing as Tolerated (WBAT)  Posterior hip precautions Dressings: Leave in place.  Replace only if necessary.  VTE prophylaxis: Lovenox, SCDs, ambulation Dispo: Home  With Home health services.  Subjective: Patient reports pain as mild. Pain controlled with PO meds.  Tolerating diet.  Urinating.  +Flatus.  No CP, SOB. OOB walking well with therapy.  Patient wants to be discharged home, not SNF.  Objective:   VITALS:   Vitals:   01/27/16 1509 01/27/16 1740 01/27/16 2210 01/28/16 0606  BP: (!) 147/69 (!) 136/56 (!) 114/49 (!) 126/51  Pulse: 92 70 74 82  Resp: 18   18  Temp: 99.4 F (37.4 C)  98.5 F (36.9 C) 99.1 F (37.3 C)  TempSrc:   Oral Oral  SpO2: 92%  91% 92%  Weight:      Height:       CBC Latest Ref Rng & Units 01/27/2016 01/26/2016 01/25/2016  WBC 4.0 - 10.5 K/uL 11.5(H) 10.0 11.8(H)  Hemoglobin 12.0 - 15.0 g/dL 10.5(L) 11.7(L) 13.4  Hematocrit 36.0 - 46.0 % 31.7(L) 35.7(L) 40.4  Platelets 150 - 400 K/uL 160 174 207   BMP Latest Ref Rng & Units 01/26/2016 01/25/2016 01/15/2016  Glucose 65 - 99 mg/dL 782(N182(H) 562(Z208(H) 308(M224(H)  BUN 6 - 20 mg/dL 17 57(Q21(H) 46(N31(H)  Creatinine 0.44 - 1.00 mg/dL 6.29(B1.30(H) 2.84(X1.34(H) 3.24(M1.53(H)  Sodium 135 - 145 mmol/L 137 140 137  Potassium 3.5 - 5.1 mmol/L 4.4 4.4 4.7  Chloride 101 - 111 mmol/L 108 106 104  CO2 22 - 32 mmol/L 24 26 26   Calcium 8.9 - 10.3 mg/dL 9.1 9.5 9.6   Intake/Output    01/24 0701 - 01/25 0700 01/25 0701 - 01/26 0700   P.O. 240 280   I.V. (mL/kg)     Other 500    IV Piggyback     Total Intake(mL/kg) 740 (11.2) 280 (4.2)   Urine (mL/kg/hr) 220 (0.1)    Stool 0 (0)    Blood     Total Output 220     Net +520 +280        Urine Occurrence 2 x    Stool Occurrence 1 x      Physical Exam: General: NAD.  Upright in chair. Resp: No increased wob Cardio: regular rate and rhythm ABD soft Neurologically intact MSK Neurovascularly intact Sensation intact distally Intact pulses distally Dorsiflexion/Plantar flexion intact Incision: dressing C/D/I  Albina BilletHenry Calvin Martensen III, PA-C 01/28/2016, 9:49 AM

## 2016-01-28 NOTE — Progress Notes (Signed)
Verified with patient that she will DC to her daughter's house. No DME in room at this time, [facility] that DME RW 3in1 tub bench will be delivered to room today.

## 2016-01-28 NOTE — Progress Notes (Signed)
Physical Therapy Treatment Patient Details Name: Jessica Meyer MRN: [id number] DOB: [birth year] Today's Date: 01/28/2016    History of Present Illness 81 y.o. female with a history of DM, HTN, HLD, hypothyroidism, anxiety, depression, brought to the emergency department after sustaining a mechanical fall as she was walking in her then, tripping in landing on her right hip. She denies any prior similar events. No LOC. S/p R hemi hip arthoplasty    PT Comments    Pt able to ambulate and do stair training but required multiple directional cuing throughout to stay within RW and proper sequencing with step through pattern due to decrease safety awareness. Educated pt and family on hip precautions because she forgot at beginning of session. She showed difficulty with staying on task and needed to be redirected in order to remain safe during treatment. She experienced no increased pain with treatment and stated that walking helped manage pain. Pt would benefit from performing stairs and ambulation with son to prepare for d/c to family member's home.  HEP handout needs to be given and hip precautions to be reviewed prior to d/c for patient safety at home.    Follow Up Recommendations  Home health PT;Supervision/Assistance - 24 hour     Equipment Recommendations  Rolling walker with 5" wheels    Recommendations for Other Services       Precautions / Restrictions Precautions Precautions: Fall;Posterior Hip Precaution Comments: Pt able to recall 2/3 hip precautions; educated pt and family on all 3. Required Braces or Orthoses: Other Brace/Splint Other Brace/Splint: abduction pillow in bed Restrictions Weight Bearing Restrictions: Yes RLE Weight Bearing: Weight bearing as tolerated    Mobility  Bed Mobility Overal bed mobility: Needs Assistance Bed Mobility: Supine to Sit;Sit to Supine Rolling: Min assist   Supine to sit: Min assist;HOB elevated Sit to supine: Min assist   General bed  mobility comments: Cues for sequencing and technique of R LE to avoid breaking precautions. Pt able to perform with min A of RLE from son.   Transfers Overall transfer level: Needs assistance Equipment used: Rolling walker (2 wheeled) Transfers: Sit to/from Stand Sit to Stand: Min guard;Min assist         General transfer comment: Min guard for safety and cues for hand placement on bed to prevent bending too far forward. Min A to initiate trunk of EOB but reduced to min guard.  Ambulation/Gait Ambulation/Gait assistance: Min guard Ambulation Distance (Feet): 200 Feet Assistive device: Rolling walker (2 wheeled) Gait Pattern/deviations: Step-to pattern;Decreased stance time - right;Antalgic;Step-through pattern;Trunk flexed;Decreased weight shift to right     General Gait Details: pt able to transition from step to into step through pattern but had difficulty with accepting weight on R. Required cuing to not advance too far within RW and proper gait sequencing.    Stairs Stairs: Yes   Stair Management: Step to pattern;Backwards;With walker;One rail Right Number of Stairs: 3 General stair comments: Showed pt/ son two options of getting up and down stairs: First trial- backwards with RW, son in from to anchor with SPTA behind. Second trial- forward facing with rail on right and son HHA on left.  Wheelchair Mobility    Modified Rankin (Stroke Patients Only)       Balance Overall balance assessment: Needs assistance Sitting-balance support: Bilateral upper extremity supported       Standing balance support: Bilateral upper extremity supported Standing balance-Leahy Scale: Poor Standing balance comment: poor standing balance without B UE support; experienced LOB during  sit to stand transfer due to not holding on to RW.                     Cognition Arousal/Alertness: Awake/alert Behavior During Therapy: WFL for tasks assessed/performed Overall Cognitive Status:  Within Functional Limits for tasks assessed Area of Impairment: Following commands;Safety/judgement;Awareness;Memory     Memory: Decreased recall of precautions;Decreased short-term memory Following Commands: Follows one step commands consistently;Follows multi-step commands inconsistently Safety/Judgement: Decreased awareness of safety     General Comments: Pt unable to stay focused on task and required redireting during stairs and bed mobility to prevent breaking precautions. Poor carryover with precations    Exercises Total Joint Exercises Quad Sets: Strengthening;10 reps;Right;Supine Gluteal Sets: Strengthening;Both;10 reps;Supine;Right Heel Slides: AROM;Strengthening;10 reps;Limitations;Right Heel Slides Limitations: hip can't go past 90 degrees per hip precautions! Hip ABduction/ADduction: Strengthening;AROM;10 reps;Supine;Limitations;Right Hip Abduction/Adduction Limitations: add to midline with cues for keeping foot straight per hip precautions.    General Comments        Pertinent Vitals/Pain Faces Pain Scale: Hurts a little bit Pain Location: R hip Pain Descriptors / Indicators: Sore Pain Intervention(s): Monitored during session;Repositioned;Ice applied    Home Living                      Prior Function            PT Goals (current goals can now be found in the care plan section) Acute Rehab PT Goals Patient Stated Goal: to go home tomorrow PT Goal Formulation: With patient/family Potential to Achieve Goals: Good Progress towards PT goals: Progressing toward goals    Frequency    Min 4X/week      PT Plan Current plan remains appropriate    Co-evaluation             End of Session Equipment Utilized During Treatment: Gait belt Activity Tolerance: Patient tolerated treatment well;Patient limited by fatigue Patient left: with call bell/phone within reach;with family/visitor present;in bed     Time: 1517-1550 PT Time Calculation  (min) (ACUTE ONLY): 33 min  Charges:  $Gait Training: 8-22 mins $Therapeutic Exercise: 8-22 mins                    G Codes:      [facility] Moody Dillion Stowers 01/28/2016, 4:53 PM  Kerrin MoMary M Anam Bobby, [place] Pager [phone]

## 2016-01-28 NOTE — Progress Notes (Signed)
PROGRESS NOTE                                                                                                                                                                                                             Patient Demographics:    Jessica Meyer, is a 81 y.o. female, DOB - January 21, 1935, WUJ:[id number]  Admit date - 01/25/2016   Admitting Physician Ozella Rocks, MD  Outpatient Primary MD for the patient is Lemont Fillers., NP  LOS - 3    Chief Complaint  Patient presents with  . Fall  . Hip Pain       Brief Narrative   82 y/o female DM, HTN, HLD, hypothyroidism, anxiety, depression, brought to the emergency department after sustaining a mechanical fall as she was walking in her then, tripping in landing on her right hip. Sustained Mildly displaced subcapital femoral neck fracture. No LOC.   Subjective:   Pain better. Participating with PT   Assessment  & Plan :    Principal Problem:   Closed right hip fracture (HCC) S/p -right hip hemiarthroplasty on 1/23. Stable post op. Participating with PT. Wants to go home with [facility]. Pain control with when necessary Vicodin. D/w ortho who recommended xarelto for 3 weeks  For compliance. Will follow up with ortho in 2 weeks.    Active Problems:  Diabetes type 2, controlled (HCC) uncontrolled . A1C of 8.6. Monitor on SSI. Increase bedtime lantus dose.     Essential (primary) hypertension Stable. Continue home meds  CKD stage 3 Renal function at baseline    Depression with anxiety Resume home meds  hypothyroidism  continue synthroid   1.   Code Status : full code  Family Communication  :  Son in Social worker at bedside  Disposition Plan  : Home with [facility] in am  Barriers For Discharge : post op  Consults  :   Orthopedics ( Dr Eulah Pont)   Procedures  :  Right hip hemiarthroplasty  DVT Prophylaxis  :  Lovenox -   Lab Results  Component Value  Date   PLT 160 01/27/2016    Antibiotics  :    Anti-infectives    Start     Dose/Rate Route Frequency Ordered Stop   01/26/16 1900  ceFAZolin (ANCEF) IVPB 1 g/50 mL premix     1 g  100 mL/hr over 30 Minutes Intravenous Every 6 hours 01/26/16 1632 01/27/16 0710   01/26/16 0800  ceFAZolin (ANCEF) IVPB 2g/100 mL premix     2 g 200 mL/hr over 30 Minutes Intravenous To ShortStay Surgical 01/25/16 2355 01/26/16 1302        Objective:   Vitals:   01/27/16 1509 01/27/16 1740 01/27/16 2210 01/28/16 0606  BP: (!) 147/69 (!) 136/56 (!) 114/49 (!) 126/51  Pulse: 92 70 74 82  Resp: 18   18  Temp: 99.4 F (37.4 C)  98.5 F (36.9 C) 99.1 F (37.3 C)  TempSrc:   Oral Oral  SpO2: 92%  91% 92%  Weight:      Height:        Wt Readings from Last 3 Encounters:  01/25/16 66.2 kg (146 lb)  01/11/16 66.2 kg (146 lb)  11/11/15 67.8 kg (149 lb 6.4 oz)     Intake/Output Summary (Last 24 hours) at 01/28/16 1134 Last data filed at 01/28/16 0919  Gross per 24 hour  Intake              520 ml  Output              220 ml  Net              300 ml     Physical Exam  Gen: not in distress HEENT: moist mucosa, supple neck Chest: clear b/l, CVS: N S1&S2, no murmurs,  GI: soft, NT, ND,  Musculoskeletal: rt hip with clean dressing    Data Review:    CBC  Recent Labs Lab 01/25/16 1120 01/26/16 0645 01/27/16 0559  WBC 11.8* 10.0 11.5*  HGB 13.4 11.7* 10.5*  HCT 40.4 35.7* 31.7*  PLT 207 174 160  MCV 90.4 91.1 91.1  MCH 30.0 29.8 30.2  MCHC 33.2 32.8 33.1  RDW 12.2 12.7 12.6  LYMPHSABS 0.9  --   --   MONOABS 0.6  --   --   EOSABS 0.1  --   --   BASOSABS 0.0  --   --     Chemistries   Recent Labs Lab 01/25/16 1120 01/26/16 0645  NA 140 137  K 4.4 4.4  CL 106 108  CO2 26 24  GLUCOSE 208* 182*  BUN 21* 17  CREATININE 1.34* 1.30*  CALCIUM 9.5 9.1   ------------------------------------------------------------------------------------------------------------------ No  results for input(s): CHOL, HDL, LDLCALC, TRIG, CHOLHDL, LDLDIRECT in the last 72 hours.  Lab Results  Component Value Date   HGBA1C 8.6 (H) 01/25/2016   ------------------------------------------------------------------------------------------------------------------ No results for input(s): TSH, T4TOTAL, T3FREE, THYROIDAB in the last 72 hours.  Invalid input(s): FREET3 ------------------------------------------------------------------------------------------------------------------ No results for input(s): VITAMINB12, FOLATE, FERRITIN, TIBC, IRON, RETICCTPCT in the last 72 hours.  Coagulation profile  Recent Labs Lab 01/26/16 0645  INR 1.12    No results for input(s): DDIMER in the last 72 hours.  Cardiac Enzymes No results for input(s): CKMB, TROPONINI, MYOGLOBIN in the last 168 hours.  Invalid input(s): CK ------------------------------------------------------------------------------------------------------------------ No results found for: BNP  Inpatient Medications  Scheduled Meds: . amLODipine  5 mg Oral Daily  . atorvastatin  10 mg Oral QHS  . buPROPion  100 mg Oral BID  . carvedilol  12.5 mg Oral BID WC  . Chlorhexidine Gluconate Cloth  6 each Topical Q0600  . insulin aspart  0-9 Units Subcutaneous TID WC  . insulin glargine  14 Units Subcutaneous QHS  . levothyroxine  62.5 mcg Oral QAC breakfast  .  lisinopril  40 mg Oral Daily  . mupirocin ointment  1 application Nasal BID   Continuous Infusions: . sodium chloride 50 mL/hr at 01/28/16 0725   PRN Meds:.acetaminophen, alum & mag hydroxide-simeth, bisacodyl, HYDROcodone-acetaminophen, hydrOXYzine, morphine injection, ondansetron (ZOFRAN) IV, senna-docusate, sodium phosphate  Micro Results Recent Results (from the past 240 hour(s))  Surgical pcr screen     Status: Abnormal   Collection Time: 01/26/16  1:48 AM  Result Value Ref Range Status   MRSA, PCR NEGATIVE NEGATIVE Final   Staphylococcus aureus  POSITIVE (A) NEGATIVE Final    Comment:        The Xpert SA Assay (FDA approved for NASAL specimens in patients over 18 years of age), is one component of a comprehensive surveillance program.  Test performance has been validated by [facility] for patients greater than or equal to 89 year old. It is not intended to diagnose infection nor to guide or monitor treatment.     Radiology Reports Dg Chest 1 View  Result Date: 01/25/2016 CLINICAL DATA:  Pain following fall EXAM: CHEST 1 VIEW COMPARISON:  November 07, 2015 FINDINGS: There is slight atelectasis in the left base. Lungs elsewhere clear. Heart size and pulmonary vascularity are normal. No adenopathy. Bones are osteoporotic. There is atherosclerotic calcification in the aorta. There is calcification in the right carotid artery. IMPRESSION: Slight left base atelectasis. No edema or consolidation. No pneumothorax. Aortic and right carotid artery calcifications/atherosclerosis. Electronically Signed   By: Bretta Bang III M.D.   On: 01/25/2016 12:09   Dg Hip Port Unilat With Pelvis 1v Right  Result Date: 01/26/2016 CLINICAL DATA:  Right hip fracture, repair EXAM: DG HIP (WITH OR WITHOUT PELVIS) 1V PORT RIGHT COMPARISON:  01/25/2016 FINDINGS: Changes of right hip replacement. Normal alignment. No hardware or bony complicating feature. IMPRESSION: Right hip replacement.  No complicating feature. Electronically Signed   By: Charlett Nose M.D.   On: 01/26/2016 15:58   Dg Hip Unilat W Or Wo Pelvis 2-3 Views Right  Result Date: 01/25/2016 CLINICAL DATA:  Pain following fall EXAM: DG HIP (WITH OR WITHOUT PELVIS) 2-3V RIGHT COMPARISON:  None. FINDINGS: Frontal pelvis as well as frontal and lateral right hip images were obtained. There is a subcapital femoral neck fracture on the right with mild displacement of fracture fragments. No other fracture evident. No dislocation. There is mild symmetric narrowing of both hip joints. Bones are  diffusely osteoporotic. There is also degenerative change in the lower lumbar spine. IMPRESSION: Mildly displaced subcapital femoral neck fracture. No dislocation. Bones osteoporotic. Electronically Signed   By: Bretta Bang III M.D.   On: 01/25/2016 12:08    Time Spent in minutes  25   Eddie North M.D on 01/28/2016 at 11:34 AM  Between 7am to 7pm - Pager - [phone]  After 7pm go to [email or web address] - password [facility]  [facility] -  Office  [phone]

## 2016-01-28 NOTE — Progress Notes (Signed)
Occupational Therapy Treatment Patient Details Name: Jessica Meyer MRN: [id number] DOB: Aug 31, 1935 Today's Date: 01/28/2016    History of present illness 81 y.o. female with a history of DM, HTN, HLD, hypothyroidism, anxiety, depression, brought to the emergency department after sustaining a mechanical fall as she was walking in her then, tripping in landing on her right hip. She denies any prior similar events. No LOC. S/p R hemi hip arthoplasty   OT comments  Pt making good progress toward OT goals this session. Able to perform simulated toilet transfer with min guard assist, continues to require max assist for LB dressing-reviewed use of AE for increased independence. Pt unable to recall posterior hip precautions; reviewed all precautions with pt and family during functional activities. D/c plan remains appropriate. Will continue to follow acutely.   Follow Up Recommendations  Home health OT;Supervision/Assistance - 24 hour    Equipment Recommendations  3 in 1 bedside commode;Tub/shower bench;Other (comment) (ADL hip kit)    Recommendations for Other Services      Precautions / Restrictions Precautions Precautions: Fall;Posterior Hip Precaution Comments: Pt unable to recall posterior hip precautions. Educated pt and family on 3/3 precautions Required Braces or Orthoses:  (abduction pillow in bed) Restrictions Weight Bearing Restrictions: Yes RLE Weight Bearing: Weight bearing as tolerated       Mobility Bed Mobility Overal bed mobility: Needs Assistance Bed Mobility: Sit to Supine       Sit to supine: Min guard   General bed mobility comments: Cues for sequencing and technique. Pt able to perform with min guard for RLE.  Transfers Overall transfer level: Needs assistance Equipment used: Rolling walker (2 wheeled) Transfers: Sit to/from Stand Sit to Stand: Min guard         General transfer comment: Min guard for safety; no physical assist required. Cues for hand  placement.    Balance Overall balance assessment: Needs assistance Sitting-balance support: Feet supported;No upper extremity supported Sitting balance-Leahy Scale: Good     Standing balance support: Bilateral upper extremity supported Standing balance-Leahy Scale: Fair                     ADL Overall ADL's : Needs assistance/impaired                     Lower Body Dressing: Maximal assistance Lower Body Dressing Details (indicate cue type and reason): to adjust socks. Pt able to state that she would not bend forward to adjust socks on her own. Family to assist as needed. Toilet Transfer: Min guard;Ambulation;BSC;RW Toilet Transfer Details (indicate cue type and reason): Educated on use of 3 in 1 over toilet. Simulated transfer by sit to stand from chair       Tub/Shower Transfer Details (indicate cue type and reason): Discussed deferring tub transfer until able to perform dry run with [facility] therapist. Pt and family agreeable to sponge bathing inititally. Functional mobility during ADLs: Min guard;Rolling walker General ADL Comments: Reviewed use of AE for increased independence with LB ADL.       Vision                     Perception     Praxis      Cognition   Behavior During Therapy: [facility] for tasks assessed/performed Overall Cognitive Status: Within Functional Limits for tasks assessed                       Extremity/Trunk  Assessment               Exercises     Shoulder Instructions       General Comments      Pertinent Vitals/ Pain       Pain Assessment: Faces Faces Pain Scale: Hurts a little bit Pain Location: R hip Pain Descriptors / Indicators: Sore Pain Intervention(s): Monitored during session;Repositioned  Home Living                                          Prior Functioning/Environment              Frequency  Min 2X/week        Progress Toward Goals  OT Goals(current goals can  now be found in the care plan section)  Progress towards OT goals: Progressing toward goals  Acute Rehab OT Goals Patient Stated Goal: go home OT Goal Formulation: With patient/family  Plan Discharge plan remains appropriate    Co-evaluation                 End of Session Equipment Utilized During Treatment: Gait belt;Rolling walker;Other (comment) (hip abduction pillow)   Activity Tolerance Patient tolerated treatment well   Patient Left in bed;with call bell/phone within reach;with nursing/sitter in room;with family/visitor present   Nurse Communication Mobility status;Other (comment) (IV site leaking)        Time: 8280-0349 OT Time Calculation (min): 25 min  Charges: OT General Charges $OT Visit: 1 Procedure OT Treatments $Self Care/Home Management : 23-37 mins  Binnie Kand M.S., OTR/L Pager: [phone]  01/28/2016, 11:37 AM

## 2016-01-28 NOTE — Progress Notes (Addendum)
ANTICOAGULATION CONSULT NOTE - Initial Consult  Pharmacy Consult for  Xarelto  Indication: VTE prophylaxis s/p right THA  No Known Allergies  Patient Measurements: Height: 5' 8"  (172.7 cm) Weight: 146 lb (66.2 kg) IBW/kg (Calculated) : 63.9   Vital Signs: Temp: 99.1 F (37.3 C) (01/25 0606) Temp Source: Oral (01/25 0606) BP: 126/51 (01/25 0606) Pulse Rate: 82 (01/25 0606)  Labs:  Recent Labs  01/25/16 1322 01/26/16 0645 01/27/16 0559  HGB  --  11.7* 10.5*  HCT  --  35.7* 31.7*  PLT  --  174 160  APTT 27  --   --   LABPROT  --  14.4  --   INR  --  1.12  --   CREATININE  --  1.30*  --     Estimated Creatinine Clearance: 36 mL/min (by C-G formula based on SCr of 1.3 mg/dL (H)).   Medical History: Past Medical History:  Diagnosis Date  . Arthritis   . Diabetes mellitus without complication ([place])   . History of chicken pox   . Hyperlipidemia   . Hypertension   . Thyroid disease    hypothyroid    Medications:  Prescriptions Prior to Admission  Medication Sig Dispense Refill Last Dose  . amLODipine (NORVASC) 5 MG tablet TAKE ONE TABLET BY MOUTH DAILY 90 tablet 1 01/24/2016 at am  . aspirin EC 81 MG tablet Take 81 mg by mouth daily.   01/24/2016 at 0800  . atorvastatin (LIPITOR) 10 MG tablet Take 1 tablet (10 mg total) by mouth at bedtime. 90 tablet 1 01/24/2016 at pm  . B-D UF III MINI PEN NEEDLES 31G X 5 MM MISC USE DAILY WITH LANTUS SOLOSTAR PENS 100 each 2 Taking  . Blood Glucose Monitoring Suppl (ACCU-CHEK AVIVA PLUS) w/Device KIT Use to check blood sugar once a day.  DX E11.9 1 kit 0 Taking  . buPROPion (WELLBUTRIN) 100 MG tablet TAKE 1 TABLET  BY MOUTH 2 TIMES DAILY. 60 tablet 5 01/24/2016 at pm  . carvedilol (COREG) 12.5 MG tablet TAKE ONE TABLET BY MOUTH TWICE A DAY WITH A MEAL 60 tablet 5 01/24/2016 at 1800  . glucose blood (ACCU-CHEK AVIVA PLUS) test strip Use as instructed to check blood sugar twice a day.  DX E11.9 200 each 1 Taking  . hydrOXYzine  (ATARAX/VISTARIL) 25 MG tablet TAKE ONE TABLET BY MOUTH TWICE A DAY AS NEEDED FOR ANXIETY 60 tablet 5 01/24/2016 at Unknown time  . Lancet Devices (ACCU-CHEK SOFTCLIX) lancets Use to check blood sugar twice a day.  DX E11.9 100 each 5 Taking  . LANTUS SOLOSTAR 100 UNIT/ML Solostar Pen INJECT 14 UNITS INTO THE SKIN BEFORE LUNCH 15 mL 1 01/24/2016 at Unknown time  . levothyroxine (SYNTHROID, LEVOTHROID) 125 MCG tablet Take 0.5 tablets (62.5 mcg total) by mouth daily before breakfast. 30 tablet 2 01/24/2016 at am  . lisinopril (PRINIVIL,ZESTRIL) 40 MG tablet TAKE ONE TABLET BY MOUTH DAILY 90 tablet 0 01/24/2016 at am  . ibuprofen (ADVIL,MOTRIN) 600 MG tablet Take 1 tablet (600 mg total) by mouth every 6 (six) hours as needed. (Patient not taking: Reported on 01/25/2016) 30 tablet 0 Not Taking at Unknown time   Scheduled:  . amLODipine  5 mg Oral Daily  . atorvastatin  10 mg Oral QHS  . buPROPion  100 mg Oral BID  . carvedilol  12.5 mg Oral BID WC  . Chlorhexidine Gluconate Cloth  6 each Topical Q0600  . insulin aspart  0-9 Units Subcutaneous TID  WC  . insulin glargine  18 Units Subcutaneous QHS  . levothyroxine  62.5 mcg Oral QAC breakfast  . lisinopril  40 mg Oral Daily  . mupirocin ointment  1 application Nasal BID    Assessment: 81 y.o female POD#2 s/p right THA currently on Lovenox 61m SQ q24h for VTE prophylaxis. Dr. DClementeen Grahamdiscussed with ortho who recommended Xarelto for 3 weeks.  Pharmacy consulted today to start Xarelto for VTE prophylaxis post hip surgery.  SCr = 1.3 CrCl is ~ 36 ml/min.  When CrCl >30 ml/min the recommended dose of Xarelto is  182mPO once daily with or without food.  If CrCl falls < 30 ml/min, Xarelto is not recommend for VTE ppx.  Dr. DhClementeen Grahamware of this. H/H low/stable and pltc 160k wnl post op.  No bleeding reported.     Plan:  Lovenox dose already given this AM.  Discontinue lovenox now and start Xarelto 1063maily tomorrow AM . To continue Xarelto x  3 week  perDr. DhuClementeen Grahamwill educate patient about Xarelto prior to discharge.  Expected discharge tomorrow.   Thank you for allowing pharmacy to be part of this patients care team. RutNicole CellaPh Clinical Pharmacist Pager: [phone]/2018,11:32 AM

## 2016-01-29 ENCOUNTER — Other Ambulatory Visit: Payer: Self-pay | Admitting: Family

## 2016-01-29 DIAGNOSIS — N183 Chronic kidney disease, stage 3 unspecified: Secondary | ICD-10-CM

## 2016-01-29 DIAGNOSIS — Z794 Long term (current) use of insulin: Secondary | ICD-10-CM

## 2016-01-29 DIAGNOSIS — E1122 Type 2 diabetes mellitus with diabetic chronic kidney disease: Secondary | ICD-10-CM

## 2016-01-29 LAB — GLUCOSE, CAPILLARY
GLUCOSE-CAPILLARY: 154 mg/dL — AB (ref 65–99)
GLUCOSE-CAPILLARY: 171 mg/dL — AB (ref 65–99)

## 2016-01-29 MED ORDER — SENNOSIDES-DOCUSATE SODIUM 8.6-50 MG PO TABS: 1.0000 | ORAL_TABLET | Freq: Every evening | ORAL | 0 refills | Status: DC | PRN

## 2016-01-29 MED ORDER — RIVAROXABAN 10 MG PO TABS: 10.0000 mg | ORAL_TABLET | Freq: Every day | ORAL | 0 refills | Status: DC

## 2016-01-29 MED ORDER — BISACODYL 5 MG PO TBEC: 5.0000 mg | DELAYED_RELEASE_TABLET | Freq: Every day | ORAL | 0 refills | Status: DC | PRN

## 2016-01-29 MED FILL — XARELTO 10 MG TABLET: 10 | 21 days supply | Qty: 21 | Fill #0

## 2016-01-29 NOTE — Progress Notes (Signed)
Verified with Xarelto rep that drug card is not applicable to post op ortho cases. Spoke with patient and daughter at bedside, they declined to wait for benefit checks for Lovenox and Xarelto, would prefer to use Xarelto even if copay several hundred dollars. Notified Rulon EisenmengerVicki RN that patient ready to DC from CM standpoint.

## 2016-01-29 NOTE — Care Management Important Message (Signed)
Important Message  Patient Details  Name: Jessica SaxonDoris Ann Rigsbee MRN: [id number] Date of Birth: 11/25/1935   Medicare Important Message Given:  Yes    Kyla BalzarineShealy, Mesiah Manzo Abena 01/29/2016, 2:09 PM

## 2016-01-29 NOTE — Discharge Summary (Signed)
Physician Discharge Summary  Stuart Guillen BWL:[id number] DOB: 1935/06/28 DOA: 01/25/2016  PCP: Nance Pear., NP  Admit date: 01/25/2016 Discharge date: 01/29/2016  Admitted From: Home Disposition:  Home  Recommendations for Outpatient Follow-up:  1. Follow up with PCP in 1-2 weeks 2. Follow-up with Dr. Percell Miller in 2 weeks. 3. Patient is being discharged on 3 weeks of oral Xarelto for DVT prophylaxis.  Home Health: RN and PT. Weightbearing as tolerated Equipment/Devices: Rolling walker  Discharge Condition: Fair CODE STATUS: Full code Diet recommendation: Carb mortified    Discharge Diagnoses:  Principal Problem:   Closed right hip fracture ([place])   Active Problems:   ASNHL (asymmetrical sensorineural hearing loss)   Essential (primary) hypertension   Depression with anxiety   Hyperlipidemia   Leukocytosis   Diabetes mellitus with hyperglycemia, with long-term current use of insulin ([facility])  Brief narrative/history of present illness 81 y/o female DM, HTN, HLD, hypothyroidism, anxiety, depression, brought to the emergency department after sustaining a mechanical fall as she was walking in her then, tripping in landing on her right hip. Sustained Mildly displaced subcapital femoral neck fracture. No loss of consciousness. Admitted to hospitalist service and orthopedics consulted.  Hospital course Principal Problem:   Closed right hip fracture [facility]) S/p -right hip hemiarthroplasty on 1/23. Stable post op. Participating with PT. Patient wished to go home with home health. Pain control with when necessary Vicodin. D/w ortho who recommended xarelto for 3 weeks for DVT prophylaxis. Added bowel regimen. Weightbearing as tolerated. Will follow up with ortho in 2 weeks.    Active Problems:  Diabetes type 2, controlled ([place]) uncontrolled . A1C of 8.6. Increased bedtime Lantus dose to 18 units however daughter informs that patient had hypoglycemic a few months back and  was concerned about increasing her Lantus dose. I will discharge her on her home Lantus dose and instructed to monitor her CBGs very closely and if persistently above 200 she needs to talk to her PCP to adjust the dose. Patient is going to see endocrinologist (Dr. Cruzita Lederer) in March.     Essential (primary) hypertension Stable. Continue home meds  CKD stage 3 Renal function at baseline    Depression with anxiety Stable. Continue home meds  hypothyroidism  continue synthroid       Family Communication  :  Daughter at bedside  Disposition Plan  : [facility]  Consults  :   Orthopedics ( Dr Percell Miller)   Procedures  :  Right hip hemiarthroplasty    Discharge Instructions   Allergies as of 01/29/2016   No Known Allergies     Medication List    STOP taking these medications   ibuprofen 600 MG tablet Commonly known as:  ADVIL,MOTRIN     TAKE these medications   ACCU-CHEK AVIVA PLUS w/Device Kit Use to check blood sugar once a day.  DX E11.9   accu-chek softclix lancets Use to check blood sugar twice a day.  DX E11.9   amLODipine 5 MG tablet Commonly known as:  NORVASC TAKE ONE TABLET BY MOUTH DAILY   aspirin EC 81 MG tablet Take 81 mg by mouth daily.   atorvastatin 10 MG tablet Commonly known as:  LIPITOR Take 1 tablet (10 mg total) by mouth at bedtime.   B-D UF III MINI PEN NEEDLES 31G X 5 MM Misc Generic drug:  Insulin Pen Needle USE DAILY WITH LANTUS SOLOSTAR PENS   baclofen 10 MG tablet Commonly known as:  LIORESAL Take 1 tablet (10 mg  total) by mouth 3 (three) times daily as needed for muscle spasms.   bisacodyl 5 MG EC tablet Commonly known as:  DULCOLAX Take 1 tablet (5 mg total) by mouth daily as needed for moderate constipation.   buPROPion 100 MG tablet Commonly known as:  WELLBUTRIN TAKE 1 TABLET  BY MOUTH 2 TIMES DAILY.   carvedilol 12.5 MG tablet Commonly known as:  COREG TAKE ONE TABLET BY MOUTH TWICE A DAY WITH A  MEAL   docusate sodium 100 MG capsule Commonly known as:  COLACE Take 1 capsule (100 mg total) by mouth 2 (two) times daily. To prevent constipation while taking pain medication.   glucose blood test strip Commonly known as:  ACCU-CHEK AVIVA PLUS Use as instructed to check blood sugar twice a day.  DX E11.9   HYDROcodone-acetaminophen 5-325 MG tablet Commonly known as:  NORCO Take 1-2 tablets by mouth every 4 (four) hours as needed for moderate pain.   hydrOXYzine 25 MG tablet Commonly known as:  ATARAX/VISTARIL TAKE ONE TABLET BY MOUTH TWICE A DAY AS NEEDED FOR ANXIETY   LANTUS SOLOSTAR 100 UNIT/ML Solostar Pen Generic drug:  Insulin Glargine INJECT 14 UNITS INTO THE SKIN BEFORE LUNCH   levothyroxine 125 MCG tablet Commonly known as:  SYNTHROID, LEVOTHROID Take 0.5 tablets (62.5 mcg total) by mouth daily before breakfast.   lisinopril 40 MG tablet Commonly known as:  PRINIVIL,ZESTRIL TAKE ONE TABLET BY MOUTH DAILY   ondansetron 4 MG tablet Commonly known as:  ZOFRAN Take 1 tablet (4 mg total) by mouth every 8 (eight) hours as needed for nausea or vomiting.   rivaroxaban 10 MG Tabs tablet Commonly known as:  XARELTO Take 1 tablet (10 mg total) by mouth daily. Start taking on:  01/30/2016   senna-docusate 8.6-50 MG tablet Commonly known as:  Senokot-S Take 1 tablet by mouth at bedtime as needed for mild constipation.            Durable Medical Equipment        Start     Ordered   01/27/16 1205  For home use only DME 3 n 1  Once     01/27/16 1204   01/27/16 1205  For home use only DME Tub bench  Once     01/27/16 1204   01/27/16 1204  For home use only DME Walker rolling  Once    Question:  Patient needs a walker to treat with the following condition  Answer:  Weakness   01/27/16 1204     Follow-up Information    MURPHY, TIMOTHY D, MD Follow up in 2 week(s).   Specialty:  Orthopedic Surgery Contact information: [address] [place]  [address] [place] Follow up.   Why:  For rolling walker, 3in1, and tub bench to be delivered to hospital room prior to discharge. Contact information: [address] [address] [place] Follow up.   Why:  For home health physical therapy. [facility] will contact you 1-2 after discharge to set up first home visit. Contact information: [place] [address] [phone]        Nance Pear., NP. Schedule an appointment as soon as possible for a visit in 1 week(s).   Specialty:  Internal Medicine Contact information: [place] [address] [phone]  No Known Allergies    Procedures/Studies: Dg Chest 1 View  Result Date: 01/25/2016 CLINICAL DATA:  Pain following fall EXAM: CHEST 1 VIEW COMPARISON:  November 07, 2015 FINDINGS: There is slight atelectasis in the left base. Lungs elsewhere clear. Heart size and pulmonary vascularity are normal. No adenopathy. Bones are osteoporotic. There is atherosclerotic calcification in the aorta. There is calcification in the right carotid artery. IMPRESSION: Slight left base atelectasis. No edema or consolidation. No pneumothorax. Aortic and right carotid artery calcifications/atherosclerosis. Electronically Signed   By: Lowella Grip III M.D.   On: 01/25/2016 12:09   Dg Hip Port Unilat With Pelvis 1v Right  Result Date: 01/26/2016 CLINICAL DATA:  Right hip fracture, repair EXAM: DG HIP (WITH OR WITHOUT PELVIS) 1V PORT RIGHT COMPARISON:  01/25/2016 FINDINGS: Changes of right hip replacement. Normal alignment. No hardware or bony complicating feature. IMPRESSION: Right hip replacement.  No complicating feature. Electronically Signed   By: Rolm Baptise M.D.   On: 01/26/2016 15:58   Dg Hip Unilat W Or Wo Pelvis 2-3 Views Right  Result Date: 01/25/2016 CLINICAL DATA:  Pain  following fall EXAM: DG HIP (WITH OR WITHOUT PELVIS) 2-3V RIGHT COMPARISON:  None. FINDINGS: Frontal pelvis as well as frontal and lateral right hip images were obtained. There is a subcapital femoral neck fracture on the right with mild displacement of fracture fragments. No other fracture evident. No dislocation. There is mild symmetric narrowing of both hip joints. Bones are diffusely osteoporotic. There is also degenerative change in the lower lumbar spine. IMPRESSION: Mildly displaced subcapital femoral neck fracture. No dislocation. Bones osteoporotic. Electronically Signed   By: Lowella Grip III M.D.   On: 01/25/2016 12:08       Subjective: Continues to feel better. No overnight events.  Discharge Exam: Vitals:   01/28/16 2123 01/29/16 0617  BP: (!) 123/59 131/63  Pulse: 83 88  Resp: 18 18  Temp: 100.3 F (37.9 C) 99.2 F (37.3 C)   Vitals:   01/28/16 0606 01/28/16 1558 01/28/16 2123 01/29/16 0617  BP: (!) 126/51 (!) 121/54 (!) 123/59 131/63  Pulse: 82 81 83 88  Resp: 18 18 18 18   Temp: 99.1 F (37.3 C) 98.6 F (37 C) 100.3 F (37.9 C) 99.2 F (37.3 C)  TempSrc: Oral Oral    SpO2: 92% 95% 96% 98%  Weight:      Height:        Gen: not in distress HEENT: moist mucosa, supple neck Chest: clear b/l, CVS: N S1&S2, no murmurs,  GI: soft, NT, ND,  Musculoskeletal: rt hip with clean dressing    The results of significant diagnostics from this hospitalization (including imaging, microbiology, ancillary and laboratory) are listed below for reference.     Microbiology: Recent Results (from the past 240 hour(s))  Surgical pcr screen     Status: Abnormal   Collection Time: 01/26/16  1:48 AM  Result Value Ref Range Status   MRSA, PCR NEGATIVE NEGATIVE Final   Staphylococcus aureus POSITIVE (A) NEGATIVE Final    Comment:        The Xpert SA Assay (FDA approved for NASAL specimens in patients over 73 years of age), is one component of a comprehensive  surveillance program.  Test performance has been validated by [facility] for patients greater than or equal to 12 year old. It is not intended to diagnose infection nor to guide or monitor treatment.      Labs: BNP (last 3 results) No results for  input(s): BNP in the last 8760 hours. Basic Metabolic Panel:  Recent Labs Lab 01/25/16 1120 01/26/16 0645  NA 140 137  K 4.4 4.4  CL 106 108  CO2 26 24  GLUCOSE 208* 182*  BUN 21* 17  CREATININE 1.34* 1.30*  CALCIUM 9.5 9.1   Liver Function Tests: No results for input(s): AST, ALT, ALKPHOS, BILITOT, PROT, ALBUMIN in the last 168 hours. No results for input(s): LIPASE, AMYLASE in the last 168 hours. No results for input(s): AMMONIA in the last 168 hours. CBC:  Recent Labs Lab 01/25/16 1120 01/26/16 0645 01/27/16 0559  WBC 11.8* 10.0 11.5*  NEUTROABS 10.2*  --   --   HGB 13.4 11.7* 10.5*  HCT 40.4 35.7* 31.7*  MCV 90.4 91.1 91.1  PLT 207 174 160   Cardiac Enzymes: No results for input(s): CKTOTAL, CKMB, CKMBINDEX, TROPONINI in the last 168 hours. BNP: Invalid input(s): POCBNP CBG:  Recent Labs Lab 01/28/16 0746 01/28/16 1218 01/28/16 1714 01/28/16 2121 01/29/16 0752  GLUCAP 152* 199* 222* 221* 154*   D-Dimer No results for input(s): DDIMER in the last 72 hours. Hgb A1c No results for input(s): HGBA1C in the last 72 hours. Lipid Profile No results for input(s): CHOL, HDL, LDLCALC, TRIG, CHOLHDL, LDLDIRECT in the last 72 hours. Thyroid function studies No results for input(s): TSH, T4TOTAL, T3FREE, THYROIDAB in the last 72 hours.  Invalid input(s): FREET3 Anemia work up No results for input(s): VITAMINB12, FOLATE, FERRITIN, TIBC, IRON, RETICCTPCT in the last 72 hours. Urinalysis    Component Value Date/Time   COLORURINE YELLOW 01/25/2016 1240   APPEARANCEUR CLEAR 01/25/2016 1240   LABSPEC 1.010 01/25/2016 1240   PHURINE 8.0 01/25/2016 1240   GLUCOSEU >=500 (A) 01/25/2016 1240   GLUCOSEU 500 (A)  07/29/2014 1215   HGBUR NEGATIVE 01/25/2016 1240   BILIRUBINUR NEGATIVE 01/25/2016 1240   KETONESUR NEGATIVE 01/25/2016 1240   PROTEINUR NEGATIVE 01/25/2016 1240   UROBILINOGEN 0.2 07/29/2014 1215   NITRITE NEGATIVE 01/25/2016 1240   LEUKOCYTESUR NEGATIVE 01/25/2016 1240   Sepsis Labs Invalid input(s): PROCALCITONIN,  WBC,  LACTICIDVEN Microbiology Recent Results (from the past 240 hour(s))  Surgical pcr screen     Status: Abnormal   Collection Time: 01/26/16  1:48 AM  Result Value Ref Range Status   MRSA, PCR NEGATIVE NEGATIVE Final   Staphylococcus aureus POSITIVE (A) NEGATIVE Final    Comment:        The Xpert SA Assay (FDA approved for NASAL specimens in patients over 69 years of age), is one component of a comprehensive surveillance program.  Test performance has been validated by [facility] for patients greater than or equal to 2 year old. It is not intended to diagnose infection nor to guide or monitor treatment.      Time coordinating discharge: Over 30 minutes  SIGNED:   Louellen Molder, MD  [facility] 01/29/2016, 11:49 AM Pager   If 7PM-7AM, please contact night-coverage [email or web address] Password [email or web address]

## 2016-01-29 NOTE — Progress Notes (Signed)
Physical Therapy Treatment Patient Details Name: Jessica SaxonDoris Ann Meyer MRN: [id number] DOB: 07/31/1935 Today's Date: 01/29/2016    History of Present Illness 81 y.o. female with a history of DM, HTN, HLD, hypothyroidism, anxiety, depression, brought to the emergency department after sustaining a mechanical fall as she was walking in her then, tripping in landing on her right hip. She denies any prior similar events. No LOC. S/p R hemi hip arthoplasty    PT Comments    Pt performed stairs better today with HHA +2 with family as SPTA/ PTA provided intermittent cuing when needed. Pt planning on being d/c today with family to private residence with RW.  She is safe with transfers and ambulation but still requires cuing for recall throughout treatment. HEP handout and precautions handout provided to assist with pt recall.    Follow Up Recommendations  Home health PT;Supervision/Assistance - 24 hour     Equipment Recommendations  Rolling walker with 5" wheels    Recommendations for Other Services       Precautions / Restrictions Precautions Precautions: Fall;Posterior Hip Precaution Comments: Pt able to recall 2/3 hip precautions; educated pt and family on all 3. Gave precaution sheet Required Braces or Orthoses: Other Brace/Splint (Abduction pillow) Restrictions Weight Bearing Restrictions: Yes RLE Weight Bearing: Weight bearing as tolerated    Mobility  Bed Mobility               General bed mobility comments: pt in chair upon arrival.  Transfers Overall transfer level: Needs assistance Equipment used: Rolling walker (2 wheeled) Transfers: Sit to/from Stand Sit to Stand: Supervision;Modified independent (Device/Increase time)         General transfer comment: Mod I for elevation out of chair with hand pushoff but required v/c for kicking out R LE to avoid hip precautions in descend to chair.   Ambulation/Gait Ambulation/Gait assistance: Min guard;Supervision Ambulation  Distance (Feet): 200 Feet Assistive device: Rolling walker (2 wheeled)   Gait velocity: slow, cautious   General Gait Details: pt able to transition from step to into step through pattern but continues to have difficulty accepting full weight on R.    Stairs     Stair Management: Step to pattern Number of Stairs: 3 General stair comments: Pt wanted to climb stairs facing forward today; instructed family with assistance of pt during stairs to simulate HHA at home because pt doesn't have rails. They were safe with ascending and descending stairs.   Wheelchair Mobility    Modified Rankin (Stroke Patients Only)       Balance Overall balance assessment: Needs assistance Sitting-balance support: Feet unsupported Sitting balance-Leahy Scale: Good     Standing balance support: Bilateral upper extremity supported;During functional activity Standing balance-Leahy Scale: Poor Standing balance comment: reliant on RW during standing and counter with standing dynamic balance.                     Cognition Arousal/Alertness: Awake/alert Behavior During Therapy: WFL for tasks assessed/performed Overall Cognitive Status: Within Functional Limits for tasks assessed Area of Impairment: Awareness;Memory;Attention     Memory: Decreased recall of precautions;Decreased short-term memory Following Commands: Follows one step commands consistently       General Comments: Pt unable to stay focused during tasks and showed poor memory recall during exercises; pt needed multiple reminders about amount of reps for exercises.     Exercises Total Joint Exercises Ankle Circles/Pumps: Seated;Both;10 reps;AROM Quad Sets: Strengthening;10 reps;Right;Seated Short Arc Quad: Strengthening;Right;Seated;10 reps Hip ABduction/ADduction: Strengthening;AROM;10 reps;Limitations;Right;Seated  Hip Abduction/Adduction Limitations: add to midline with cues for keeping foot straight per hip  precautions. Long Arc Quad: Strengthening;Right;10 reps;Seated Marching in Standing: 10 reps;Right;Strengthening;Standing;Limitations Marching in Standing Limitations: educated pt to not go past 90 degrees Standing Hip Extension: Strengthening;Right;10 reps;Standing Other Exercises Other Exercises: HS Curls and hip abd in standing x 10 reps each    General Comments        Pertinent Vitals/Pain Pain Score: 2  Pain Location: R hip Pain Descriptors / Indicators: Sore Pain Intervention(s): Monitored during session;Repositioned    Home Living                      Prior Function            PT Goals (current goals can now be found in the care plan section) Acute Rehab PT Goals Patient Stated Goal: to go home today PT Goal Formulation: With patient/family Potential to Achieve Goals: Good Progress towards PT goals: Progressing toward goals    Frequency    Min 4X/week      PT Plan Current plan remains appropriate    Co-evaluation             End of Session Equipment Utilized During Treatment: Gait belt Activity Tolerance: Patient tolerated treatment well;Other (comment) (Pt limited by cognition- confusion and memory recall) Patient left: with call bell/phone within reach;with family/visitor present;in chair (gave pt HEP and hip precautions handouts)     Time: 4540-9811 PT Time Calculation (min) (ACUTE ONLY): 23 min  Charges:  $Gait Training: 8-22 mins $Therapeutic Exercise: 8-22 mins                    G Codes:      [facility] 28-Feb-2016, 11:29 AM Kerrin Mo, SPTA Pager [phone]

## 2016-01-29 NOTE — Progress Notes (Signed)
Nsg Discharge Note  Admit Date:  01/25/2016 Discharge date: 01/29/2016   [name] to be D/C'd Home per MD order.  AVS completed.  Copy for chart, and copy for patient signed, and dated. Patient/caregiver able to verbalize understanding.  Discharge Medication: Allergies as of 01/29/2016   No Known Allergies     Medication List    STOP taking these medications   ibuprofen 600 MG tablet Commonly known as:  ADVIL,MOTRIN     TAKE these medications   ACCU-CHEK AVIVA PLUS w/Device Kit Use to check blood sugar once a day.  DX E11.9   accu-chek softclix lancets Use to check blood sugar twice a day.  DX E11.9   amLODipine 5 MG tablet Commonly known as:  NORVASC TAKE ONE TABLET BY MOUTH DAILY   aspirin EC 81 MG tablet Take 81 mg by mouth daily.   atorvastatin 10 MG tablet Commonly known as:  LIPITOR Take 1 tablet (10 mg total) by mouth at bedtime.   B-D UF III MINI PEN NEEDLES 31G X 5 MM Misc Generic drug:  Insulin Pen Needle USE DAILY WITH LANTUS SOLOSTAR PENS   baclofen 10 MG tablet Commonly known as:  LIORESAL Take 1 tablet (10 mg total) by mouth 3 (three) times daily as needed for muscle spasms.   bisacodyl 5 MG EC tablet Commonly known as:  DULCOLAX Take 1 tablet (5 mg total) by mouth daily as needed for moderate constipation.   buPROPion 100 MG tablet Commonly known as:  WELLBUTRIN TAKE 1 TABLET  BY MOUTH 2 TIMES DAILY.   carvedilol 12.5 MG tablet Commonly known as:  COREG TAKE ONE TABLET BY MOUTH TWICE A DAY WITH A MEAL   docusate sodium 100 MG capsule Commonly known as:  COLACE Take 1 capsule (100 mg total) by mouth 2 (two) times daily. To prevent constipation while taking pain medication.   glucose blood test strip Commonly known as:  ACCU-CHEK AVIVA PLUS Use as instructed to check blood sugar twice a day.  DX E11.9   HYDROcodone-acetaminophen 5-325 MG tablet Commonly known as:  NORCO Take 1-2 tablets by mouth every 4 (four) hours as needed for  moderate pain.   hydrOXYzine 25 MG tablet Commonly known as:  ATARAX/VISTARIL TAKE ONE TABLET BY MOUTH TWICE A DAY AS NEEDED FOR ANXIETY   LANTUS SOLOSTAR 100 UNIT/ML Solostar Pen Generic drug:  Insulin Glargine INJECT 14 UNITS INTO THE SKIN BEFORE LUNCH   levothyroxine 125 MCG tablet Commonly known as:  SYNTHROID, LEVOTHROID Take 0.5 tablets (62.5 mcg total) by mouth daily before breakfast.   lisinopril 40 MG tablet Commonly known as:  PRINIVIL,ZESTRIL TAKE ONE TABLET BY MOUTH DAILY   ondansetron 4 MG tablet Commonly known as:  ZOFRAN Take 1 tablet (4 mg total) by mouth every 8 (eight) hours as needed for nausea or vomiting.   rivaroxaban 10 MG Tabs tablet Commonly known as:  XARELTO Take 1 tablet (10 mg total) by mouth daily. Start taking on:  01/30/2016   senna-docusate 8.6-50 MG tablet Commonly known as:  Senokot-S Take 1 tablet by mouth at bedtime as needed for mild constipation.            Durable Medical Equipment        Start     Ordered   01/27/16 1205  For home use only DME 3 n 1  Once     01/27/16 1204   01/27/16 1205  For home use only DME Tub bench  Once  01/27/16 1204   01/27/16 1204  For home use only DME Walker rolling  Once    Question:  Patient needs a walker to treat with the following condition  Answer:  Weakness   01/27/16 1204      Discharge Assessment: Vitals:   01/28/16 2123 01/29/16 0617  BP: (!) 123/59 131/63  Pulse: 83 88  Resp: 18 18  Temp: 100.3 F (37.9 C) 99.2 F (37.3 C)   Skin clean, dry and intact without evidence of skin break down, no evidence of skin tears noted. IV catheter discontinued intact. Site without signs and symptoms of complications - no redness or edema noted at insertion site, patient denies c/o pain - only slight tenderness at site.  Dressing with slight pressure applied.  D/c Instructions-Education: Discharge instructions given to patient/family with verbalized understanding. D/c education completed  with patient/family including follow up instructions, medication list, d/c activities limitations if indicated, with other d/c instructions as indicated by MD - patient able to verbalize understanding, all questions fully answered. Patient instructed to return to ED, call 911, or call MD for any changes in condition.  Patient escorted via [place], and D/C home via private auto.  Salley Slaughter, RN 01/29/2016 12:52 PM

## 2016-01-29 NOTE — Progress Notes (Signed)
Occupational Therapy Treatment Patient Details Name: Jessica SaxonDoris Ann Meyer MRN: [id number] DOB: 10/19/1935 Today's Date: 01/29/2016    History of present illness 81 y.o. female with a history of DM, HTN, HLD, hypothyroidism, anxiety, depression, brought to the emergency department after sustaining a mechanical fall as she was walking in her then, tripping in landing on her right hip. She denies any prior similar events. No LOC. S/p R hemi hip arthoplasty   OT comments  Pt progressing towards acute OT goals. Focus of session was education on techniques for AE for LB ADLs, techniques for maintaining posterior hip precautions during ADLs and functional transfers/mobility. Family present and included. D/c plan remains appropriate.    Follow Up Recommendations  Home health OT;Supervision/Assistance - 24 hour    Equipment Recommendations  3 in 1 bedside commode;Tub/shower bench;Other (comment)    Recommendations for Other Services      Precautions / Restrictions Precautions Precautions: Fall;Posterior Hip Precaution Comments: educated on precautions, family present Required Braces or Orthoses: Other Brace/Splint Other Brace/Splint: abduction pillow in bed Restrictions Weight Bearing Restrictions: Yes RLE Weight Bearing: Weight bearing as tolerated       Mobility Bed Mobility               General bed mobility comments: pt in chair upon arrival.  Transfers Overall transfer level: Needs assistance Equipment used: Rolling walker (2 wheeled) Transfers: Sit to/from Stand Sit to Stand: Supervision;Modified independent (Device/Increase time)         General transfer comment: Pt declined. Just finished with PT.    Balance Overall balance assessment: Needs assistance Sitting-balance support: Feet unsupported Sitting balance-Leahy Scale: Good     Standing balance support: Bilateral upper extremity supported;During functional activity Standing balance-Leahy Scale: Poor Standing  balance comment: reliant on RW during standing and counter with standing dynamic balance.                    ADL Overall ADL's : Needs assistance/impaired                                       General ADL Comments: Pt and family with questions about AE per PT report. Pt seen at chair level having just completed PT session and awaiting d/c. Educated/demonstrated AE use for LB ADLs with family present. Also reviewed tub transfer bench, toilet transfer and general fall prevention education.      Vision                     Perception     Praxis      Cognition   Behavior [facility]: [facility] for tasks assessed/performed Overall Cognitive Status: Within Functional Limits for tasks assessed Area of Impairment: Awareness;Memory;Attention     Memory: Decreased recall of precautions;Decreased short-term memory  Following Commands: Follows one step commands consistently       General Comments: Pt unable to stay focused during tasks and showed poor memory recall during exercises; pt needed multiple reminders about amount of reps for exercises.     Extremity/Trunk Assessment               Exercises Total Joint Exercises Ankle Circles/Pumps: Seated;Both;10 reps;AROM Quad Sets: Strengthening;10 reps;Right;Seated Short Arc Quad: Strengthening;Right;Seated;10 reps Hip ABduction/ADduction: Strengthening;AROM;10 reps;Limitations;Right;Seated Hip Abduction/Adduction Limitations: add to midline with cues for keeping foot straight per hip precautions. Long Arc Quad: Strengthening;Right;10 reps;Seated Marching in Standing: 10 reps;Right;Strengthening;Standing;Limitations  Marching in Standing Limitations: educated pt to not go past 90 degrees Standing Hip Extension: Strengthening;Right;10 reps;Standing Other Exercises Other Exercises: HS Curls and hip abd in standing x 10 reps each   Shoulder Instructions       General Comments      Pertinent Vitals/  Pain       Pain Assessment: 0-10 Pain Score: 2  Pain Location: R hip Pain Descriptors / Indicators: Sore Pain Intervention(s): Monitored during session  Home Living                                          Prior Functioning/Environment              Frequency  Min 2X/week        Progress Toward Goals  OT Goals(current goals can now be found in the care plan section)  Progress towards OT goals: Progressing toward goals  Acute Rehab OT Goals Patient Stated Goal: to go home today OT Goal Formulation: With patient/family Time For Goal Achievement: 02/03/16 Potential to Achieve Goals: Good ADL Goals Pt Will Perform Grooming: with min guard assist;with supervision;standing;with caregiver independent in assisting Pt Will Perform Lower Body Bathing: with min assist;with caregiver independent in assisting Pt Will Perform Lower Body Dressing: with mod assist;with caregiver independent in assisting Pt Will Transfer to Toilet: with min assist;with min guard assist;ambulating Pt Will Perform Toileting - Clothing Manipulation and hygiene: with min assist;with min guard assist;sit to/from stand;with caregiver independent in assisting Pt Will Perform Tub/Shower Transfer: with min assist;tub bench;ambulating;rolling walker;with caregiver independent in assisting Additional ADL Goal #1: Pt will recall all hip precautions and maintain during ADLs and ADL mobility  Plan Discharge plan remains appropriate    Co-evaluation                 End of Session     Activity Tolerance     Patient Left in chair;with call bell/phone within reach;with family/visitor present;with nursing/sitter in room   Nurse Communication          Time: 1610-9604 OT Time Calculation (min): 8 min  Charges: OT General Charges $OT Visit: 1 Procedure OT Treatments $Self Care/Home Management : 8-22 mins  Pilar Grammes 01/29/2016, 12:08 PM

## 2016-01-29 NOTE — Progress Notes (Addendum)
   Assessment / Plan: 3 Days Post-Op  S/P Procedure(s) (LRB): ARTHROPLASTY BIPOLAR HIP (HEMIARTHROPLASTY) (Right) by Dr. Jewel Baizeimothy D. Eulah Meyer on 01/26/16  Principal Problem:   Closed right hip fracture (HCC) Active Problems:   ASNHL (asymmetrical sensorineural hearing loss)   Diabetes type 2, controlled (HCC)   Essential (primary) hypertension   Depression with anxiety   Hyperlipidemia   Leukocytosis Acute Blood loss anemia mild, asymptomatic- likely with dilutional component.    Doing well from an orthopedic perspective.    Weight Bearing: Weight Bearing as Tolerated (WBAT)  Posterior hip precautions Dressings: Leave in place.  Replace only if necessary.  VTE prophylaxis: Lovenox, SCDs, ambulation Dispo: Home  With Home health services planning for today 01/29/16  Follow up in the office with Dr. Wandra Meyer. Jessica Meyer in 2 weeks.  Please call with questions.  Subjective: Patient reports pain as mild. Pain controlled with PO meds.  Tolerating diet.  Urinating.  +Flatus.  No CP, SOB. OOB walking well with therapy.  Patient wants to be discharged home, not SNF.  Objective:   VITALS:   Vitals:   01/28/16 0606 01/28/16 1558 01/28/16 2123 01/29/16 0617  BP: (!) 126/51 (!) 121/54 (!) 123/59 131/63  Pulse: 82 81 83 88  Resp: 18 18 18 18   Temp: 99.1 F (37.3 C) 98.6 F (37 C) 100.3 F (37.9 C) 99.2 F (37.3 C)  TempSrc: Oral Oral    SpO2: 92% 95% 96% 98%  Weight:      Height:       CBC Latest Ref Rng & Units 01/27/2016 01/26/2016 01/25/2016  WBC 4.0 - 10.5 K/uL 11.5(H) 10.0 11.8(H)  Hemoglobin 12.0 - 15.0 g/dL 10.5(L) 11.7(L) 13.4  Hematocrit 36.0 - 46.0 % 31.7(L) 35.7(L) 40.4  Platelets 150 - 400 K/uL 160 174 207   BMP Latest Ref Rng & Units 01/26/2016 01/25/2016 01/15/2016  Glucose 65 - 99 mg/dL 161(W182(H) 960(A208(H) 540(J224(H)  BUN 6 - 20 mg/dL 17 81(X21(H) 91(Y31(H)  Creatinine 0.44 - 1.00 mg/dL 7.82(N1.30(H) 5.62(Z1.34(H) 3.08(M1.53(H)  Sodium 135 - 145 mmol/L 137 140 137  Potassium 3.5 - 5.1 mmol/L 4.4 4.4 4.7    Chloride 101 - 111 mmol/L 108 106 104  CO2 22 - 32 mmol/L 24 26 26   Calcium 8.9 - 10.3 mg/dL 9.1 9.5 9.6   Intake/Output      01/25 0701 - 01/26 0700 01/26 0701 - 01/27 0700   P.O. 400    I.V. (mL/kg) 240 (3.6)    Other     Total Intake(mL/kg) 640 (9.7)    Urine (mL/kg/hr) 1350 (0.8)    Stool     Total Output 1350     Net -710           Physical Exam: General: NAD.  Upright in bed. MSK Neurovascularly intact Sensation intact distally Feet warm Dorsiflexion/Plantar flexion intact Incision: dressing C/D/I  Jessica BilletHenry Calvin Martensen III, PA-C 01/29/2016, 7:58 AM

## 2016-01-30 DIAGNOSIS — Z794 Long term (current) use of insulin: Secondary | ICD-10-CM | POA: Diagnosis not present

## 2016-01-30 DIAGNOSIS — I129 Hypertensive chronic kidney disease with stage 1 through stage 4 chronic kidney disease, or unspecified chronic kidney disease: Secondary | ICD-10-CM | POA: Diagnosis not present

## 2016-01-30 DIAGNOSIS — F341 Dysthymic disorder: Secondary | ICD-10-CM | POA: Diagnosis not present

## 2016-01-30 DIAGNOSIS — E785 Hyperlipidemia, unspecified: Secondary | ICD-10-CM | POA: Diagnosis not present

## 2016-01-30 DIAGNOSIS — Z96641 Presence of right artificial hip joint: Secondary | ICD-10-CM | POA: Diagnosis not present

## 2016-01-30 DIAGNOSIS — N183 Chronic kidney disease, stage 3 (moderate): Secondary | ICD-10-CM | POA: Diagnosis not present

## 2016-01-30 DIAGNOSIS — S72001D Fracture of unspecified part of neck of right femur, subsequent encounter for closed fracture with routine healing: Secondary | ICD-10-CM | POA: Diagnosis not present

## 2016-01-30 DIAGNOSIS — E1122 Type 2 diabetes mellitus with diabetic chronic kidney disease: Secondary | ICD-10-CM | POA: Diagnosis not present

## 2016-01-31 ENCOUNTER — Telehealth: Payer: Self-pay | Admitting: Family

## 2016-01-31 NOTE — Telephone Encounter (Signed)
Spoke with daughter. She told me that pt was a bit orthostatic SBP 110 down to 90 with standing with PT. Has had some dizziness with standing.  Advised daughter to have pt hold amlodipine and resume amlodipine if sbp >160.  She verbalizes understanding.  Please contact pt at daughter's number to complete TCM follow up.

## 2016-02-01 ENCOUNTER — Telehealth: Payer: Self-pay | Admitting: Behavioral Health

## 2016-02-01 DIAGNOSIS — N183 Chronic kidney disease, stage 3 (moderate): Secondary | ICD-10-CM | POA: Diagnosis not present

## 2016-02-01 DIAGNOSIS — Z96641 Presence of right artificial hip joint: Secondary | ICD-10-CM | POA: Diagnosis not present

## 2016-02-01 DIAGNOSIS — S72001D Fracture of unspecified part of neck of right femur, subsequent encounter for closed fracture with routine healing: Secondary | ICD-10-CM | POA: Diagnosis not present

## 2016-02-01 DIAGNOSIS — F341 Dysthymic disorder: Secondary | ICD-10-CM | POA: Diagnosis not present

## 2016-02-01 DIAGNOSIS — E1122 Type 2 diabetes mellitus with diabetic chronic kidney disease: Secondary | ICD-10-CM | POA: Diagnosis not present

## 2016-02-01 DIAGNOSIS — I129 Hypertensive chronic kidney disease with stage 1 through stage 4 chronic kidney disease, or unspecified chronic kidney disease: Secondary | ICD-10-CM | POA: Diagnosis not present

## 2016-02-01 NOTE — Telephone Encounter (Signed)
TCM/Hospital Follow-up completed. 

## 2016-02-01 NOTE — Telephone Encounter (Signed)
Transition Care Management Follow-up Telephone Call  PCP: Lemont Fillers'SULLIVAN,MELISSA S., NP  Admit date: 01/25/2016 Discharge date: 01/29/2016  Admitted From: Home Disposition:  Home  Recommendations for Outpatient Follow-up:  1. Follow up with PCP in 1-2 weeks 2. Follow-up with Dr. Eulah PontMurphy in 2 weeks. 3. Patient is being discharged on 3 weeks of oral Xarelto for DVT prophylaxis.   How have you been since you were released from the hospital? Per patient's daughter, "she's doing ok".   Do you understand why you were in the hospital? yes   Do you understand the discharge instructions? yes   Where were you discharged to? Home with daughter.   Items Reviewed:  Medications reviewed: yes, per the daughter, they are currently holding Norvasc per PCP's orders.  Allergies reviewed: yes, NKA  Dietary changes reviewed: yes, there were no changes made to diet.  Referrals reviewed: yes, Follow up with PCP in 1-2 weeks; Follow-up with Dr. Eulah PontMurphy in 2 weeks.   Functional Questionnaire:   Activities of Daily Living (ADLs):   She states they are independent in the following: bathing and hygiene, feeding, continence, grooming, toileting and dressing States they require assistance with the following: patient is ambulating with a walker.   Any transportation issues/concerns?: no   Any patient concerns? no   Confirmed importance and date/time of follow-up visits scheduled yes, 02/15/16 at 6:00 PM.  Provider Appointment booked with Sandford CrazeMelissa O'Sullivan, NP.  Confirmed with patient if condition begins to worsen call PCP or go to the ER.  Patient was given the office number and encouraged to call back with question or concerns.  : yes

## 2016-02-04 DIAGNOSIS — F341 Dysthymic disorder: Secondary | ICD-10-CM | POA: Diagnosis not present

## 2016-02-04 DIAGNOSIS — E1122 Type 2 diabetes mellitus with diabetic chronic kidney disease: Secondary | ICD-10-CM | POA: Diagnosis not present

## 2016-02-04 DIAGNOSIS — I129 Hypertensive chronic kidney disease with stage 1 through stage 4 chronic kidney disease, or unspecified chronic kidney disease: Secondary | ICD-10-CM | POA: Diagnosis not present

## 2016-02-04 DIAGNOSIS — S72001D Fracture of unspecified part of neck of right femur, subsequent encounter for closed fracture with routine healing: Secondary | ICD-10-CM | POA: Diagnosis not present

## 2016-02-04 DIAGNOSIS — N183 Chronic kidney disease, stage 3 (moderate): Secondary | ICD-10-CM | POA: Diagnosis not present

## 2016-02-04 DIAGNOSIS — Z96641 Presence of right artificial hip joint: Secondary | ICD-10-CM | POA: Diagnosis not present

## 2016-02-08 ENCOUNTER — Ambulatory Visit: Payer: Medicare Other | Admitting: Family

## 2016-02-09 DIAGNOSIS — Z96641 Presence of right artificial hip joint: Secondary | ICD-10-CM | POA: Diagnosis not present

## 2016-02-09 DIAGNOSIS — N183 Chronic kidney disease, stage 3 (moderate): Secondary | ICD-10-CM | POA: Diagnosis not present

## 2016-02-09 DIAGNOSIS — F341 Dysthymic disorder: Secondary | ICD-10-CM | POA: Diagnosis not present

## 2016-02-09 DIAGNOSIS — S72001D Fracture of unspecified part of neck of right femur, subsequent encounter for closed fracture with routine healing: Secondary | ICD-10-CM | POA: Diagnosis not present

## 2016-02-09 DIAGNOSIS — I129 Hypertensive chronic kidney disease with stage 1 through stage 4 chronic kidney disease, or unspecified chronic kidney disease: Secondary | ICD-10-CM | POA: Diagnosis not present

## 2016-02-09 DIAGNOSIS — E1122 Type 2 diabetes mellitus with diabetic chronic kidney disease: Secondary | ICD-10-CM | POA: Diagnosis not present

## 2016-02-10 DIAGNOSIS — S72001D Fracture of unspecified part of neck of right femur, subsequent encounter for closed fracture with routine healing: Secondary | ICD-10-CM | POA: Diagnosis not present

## 2016-02-11 ENCOUNTER — Ambulatory Visit: Payer: Medicare Other | Admitting: Physical Therapy

## 2016-02-12 ENCOUNTER — Encounter: Payer: Self-pay | Admitting: Physical Therapy

## 2016-02-12 DIAGNOSIS — F341 Dysthymic disorder: Secondary | ICD-10-CM | POA: Diagnosis not present

## 2016-02-12 DIAGNOSIS — S72001D Fracture of unspecified part of neck of right femur, subsequent encounter for closed fracture with routine healing: Secondary | ICD-10-CM | POA: Diagnosis not present

## 2016-02-12 DIAGNOSIS — Z96641 Presence of right artificial hip joint: Secondary | ICD-10-CM | POA: Diagnosis not present

## 2016-02-12 DIAGNOSIS — I129 Hypertensive chronic kidney disease with stage 1 through stage 4 chronic kidney disease, or unspecified chronic kidney disease: Secondary | ICD-10-CM | POA: Diagnosis not present

## 2016-02-12 DIAGNOSIS — N183 Chronic kidney disease, stage 3 (moderate): Secondary | ICD-10-CM | POA: Diagnosis not present

## 2016-02-12 DIAGNOSIS — E1122 Type 2 diabetes mellitus with diabetic chronic kidney disease: Secondary | ICD-10-CM | POA: Diagnosis not present

## 2016-02-15 ENCOUNTER — Inpatient Hospital Stay: Payer: Medicare Other | Admitting: Family

## 2016-02-15 ENCOUNTER — Ambulatory Visit: Payer: Medicare Other | Admitting: Physical Therapy

## 2016-03-07 ENCOUNTER — Other Ambulatory Visit: Payer: Self-pay | Admitting: Family

## 2016-03-07 ENCOUNTER — Encounter: Payer: Self-pay | Admitting: Family

## 2016-03-07 ENCOUNTER — Ambulatory Visit (INDEPENDENT_AMBULATORY_CARE_PROVIDER_SITE_OTHER): Payer: Medicare Other | Admitting: Family

## 2016-03-07 DIAGNOSIS — H35361 Drusen (degenerative) of macula, right eye: Secondary | ICD-10-CM | POA: Diagnosis not present

## 2016-03-07 DIAGNOSIS — H2513 Age-related nuclear cataract, bilateral: Secondary | ICD-10-CM | POA: Diagnosis not present

## 2016-03-07 DIAGNOSIS — E118 Type 2 diabetes mellitus with unspecified complications: Secondary | ICD-10-CM

## 2016-03-07 DIAGNOSIS — S72001A Fracture of unspecified part of neck of right femur, initial encounter for closed fracture: Secondary | ICD-10-CM

## 2016-03-07 DIAGNOSIS — F418 Other specified anxiety disorders: Secondary | ICD-10-CM | POA: Diagnosis not present

## 2016-03-07 DIAGNOSIS — E119 Type 2 diabetes mellitus without complications: Secondary | ICD-10-CM | POA: Diagnosis not present

## 2016-03-07 LAB — HM DIABETES EYE EXAM

## 2016-03-07 MED ORDER — HYDROXYZINE HCL 25 MG PO TABS: 25.0000 mg | ORAL_TABLET | Freq: Two times a day (BID) | ORAL | 5 refills | Status: DC | PRN

## 2016-03-07 NOTE — Assessment & Plan Note (Signed)
Stable post-operatively.  Monitor.

## 2016-03-07 NOTE — Assessment & Plan Note (Signed)
Stable on current medications. Continue same. 

## 2016-03-07 NOTE — Assessment & Plan Note (Signed)
Sugar is above goal. Continue current dose of insulin and will defer dose changes to endocrinology.

## 2016-03-07 NOTE — Patient Instructions (Signed)
Please keep your upcoming appointment with Dr. Wyonia HoughGerghe.

## 2016-03-07 NOTE — Progress Notes (Signed)
Subjective:    Patient ID: Jessica Meyer, female    DOB: January 12, 1935, 81 y.o.   MRN: [id number]  HPI  Jessica Meyer is a 81 yr old female who presents today for hospital follow up.   She was admitted 01/25/16 following a mechanical fall and suffered a closed right hip fracture. She underwent right hip hemiarthroplasty on 1/23.  Post operatively she was doing well and was d/c'd home with PT.  PT came to the house for 2 weeks and then signed off because she was doing well. She is now back at home living independently and is ambulating with a cane. She was also d/c'd home with xarelto x 3 weeks for DVT prophylaxis. She tells me she has completed xarelto.    DM2- has upcoming appointment with endocrinology. Reports home sugars around 180.   Lab Results  Component Value Date   HGBA1C 8.6 (H) 01/25/2016   HGBA1C 8.7 (H) 01/15/2016   HGBA1C 7.5 (H) 09/14/2015   Lab Results  Component Value Date   MICROALBUR <0.7 05/11/2015   LDLCALC 71 05/11/2015   CREATININE 1.30 (H) 01/26/2016   Depression/anxiety- reports mood is good.     Review of Systems   See HPI  Past Medical History:  Diagnosis Date  . Arthritis   . Diabetes mellitus without complication ([place])   . History of chicken pox   . Hyperlipidemia   . Hypertension   . Thyroid disease    hypothyroid     Social History   Social History  . Marital status: Single    Spouse name: N/A  . Number of children: N/A  . Years of education: N/A   Occupational History  . Not on file.   Social History Main Topics  . Smoking status: Never Smoker  . Smokeless tobacco: Current User    Types: Snuff  . Alcohol use 0.6 oz/week    1 Standard drinks or equivalent per week     Comment: occasional beer  . Drug use: No  . Sexual activity: Not on file   Other Topics Concern  . Not on file   Social History Narrative   Retired homemaker   Widow   Completed 7th grade   2 daughter   Jessica Meyer and daughter. [place]aughter local, [place] in [place]   4  grandchildren   Enjoys puzzles, gardening.       Past Surgical History:  Procedure Laterality Date  . ABDOMINAL HYSTERECTOMY    . HIP ARTHROPLASTY Right 01/26/2016   Procedure: ARTHROPLASTY BIPOLAR HIP (HEMIARTHROPLASTY);  Surgeon: Renette Butters, MD;  Location: [facility];  Service: Orthopedics;  Laterality: Right;  . KNEE ARTHROSCOPY Bilateral   . REPLACEMENT TOTAL KNEE BILATERAL     1998 left , 1992 right    Family History  Problem Relation Age of Onset  . Arthritis Mother   . Depression Mother   . Heart attack Father     57   . Hypertension Father     No Known Allergies  Current Outpatient Prescriptions on File Prior to Visit  Medication Sig Dispense Refill  . amLODipine (NORVASC) 5 MG tablet TAKE ONE TABLET BY MOUTH DAILY (Patient not taking: Reported on 02/01/2016) 90 tablet 1  . aspirin EC 81 MG tablet Take 81 mg by mouth daily.    Marland Kitchen atorvastatin (LIPITOR) 10 MG tablet TAKE ONE TABLET BY MOUTH AT BEDTIME 90 tablet 1  . B-D UF III MINI PEN NEEDLES 31G X 5 MM MISC USE DAILY WITH  LANTUS SOLOSTAR PENS 100 each 2  . Blood Glucose Monitoring Suppl (ACCU-CHEK AVIVA PLUS) w/Device KIT Use to check blood sugar once a day.  DX E11.9 1 kit 0  . buPROPion (WELLBUTRIN) 100 MG tablet TAKE 1 TABLET  BY MOUTH 2 TIMES DAILY. 60 tablet 5  . carvedilol (COREG) 12.5 MG tablet TAKE ONE TABLET BY MOUTH TWICE A DAY WITH A MEAL 60 tablet 5  . glucose blood (ACCU-CHEK AVIVA PLUS) test strip Use as instructed to check blood sugar twice a day.  DX E11.9 200 each 1  . Lancet Devices (ACCU-CHEK SOFTCLIX) lancets Use to check blood sugar twice a day.  DX E11.9 100 each 5  . LANTUS SOLOSTAR 100 UNIT/ML Solostar Pen INJECT 14 UNITS INTO THE SKIN BEFORE LUNCH 15 mL 1  . levothyroxine (SYNTHROID, LEVOTHROID) 125 MCG tablet Take 0.5 tablets (62.5 mcg total) by mouth daily before breakfast. 30 tablet 2  . lisinopril (PRINIVIL,ZESTRIL) 40 MG tablet TAKE ONE TABLET BY MOUTH DAILY 90 tablet 0  . rivaroxaban  (XARELTO) 10 MG TABS tablet Take 1 tablet (10 mg total) by mouth daily. 21 tablet 0   No current facility-administered medications on file prior to visit.     BP 120/82 (BP Location: Left Arm, Patient Position: Sitting, Cuff Size: Normal)   Pulse 79   Temp 98.1 F (36.7 C) (Oral)   Ht 5' 8" (1.727 m)   Wt 143 lb (64.9 kg)   SpO2 98%   BMI 21.74 kg/m        Objective:   Physical Exam  Constitutional: She is oriented to person, place, and time. She appears well-developed and well-nourished.  HENT:  Head: Normocephalic and atraumatic.  Cardiovascular: Normal rate, regular rhythm and normal heart sounds.   No murmur heard. Pulmonary/Chest: Effort normal and breath sounds normal. No respiratory distress. She has no wheezes.  Musculoskeletal: She exhibits no edema.  Neurological: She is alert and oriented to person, place, and time.  Skin: Skin is warm and dry. No rash noted.  Psychiatric: She has a normal mood and affect. Her behavior is normal. Judgment and thought content normal.          Assessment & Plan:

## 2016-03-07 NOTE — Progress Notes (Signed)
Pre visit review using our clinic review tool, if applicable. No additional management support is needed unless otherwise documented below in the visit note. 

## 2016-03-11 ENCOUNTER — Telehealth: Payer: Self-pay | Admitting: *Deleted

## 2016-03-11 MED ORDER — LEVOTHYROXINE SODIUM 125 MCG PO TABS: 62.5000 ug | ORAL_TABLET | Freq: Every day | ORAL | 1 refills | Status: DC

## 2016-03-11 NOTE — Telephone Encounter (Signed)
[facility] refill of levothyroxine 125mcg, take 1/2 tablet by mouth daily before breakfast. Refill sent.

## 2016-03-16 ENCOUNTER — Encounter: Payer: Self-pay | Admitting: Family

## 2016-03-16 DIAGNOSIS — S72001D Fracture of unspecified part of neck of right femur, subsequent encounter for closed fracture with routine healing: Secondary | ICD-10-CM | POA: Diagnosis not present

## 2016-03-18 ENCOUNTER — Ambulatory Visit: Payer: Medicare Other | Admitting: Internal Medicine

## 2016-03-22 ENCOUNTER — Telehealth: Payer: Self-pay | Admitting: Family

## 2016-03-22 NOTE — Telephone Encounter (Signed)
Spoke with patient regarding awv. Patient stated that daughter needed to be called to schedule her appt. Called patients daughter Rodena MedinDeborah Grant. Lvm for Mrs. Kennedy BuckerGrant to call office to schedule appt.

## 2016-03-28 ENCOUNTER — Other Ambulatory Visit: Payer: Self-pay | Admitting: Family

## 2016-04-11 ENCOUNTER — Ambulatory Visit: Payer: Medicare Other | Attending: Orthopedic Surgery | Admitting: Physical Therapy

## 2016-04-11 DIAGNOSIS — R2681 Unsteadiness on feet: Secondary | ICD-10-CM | POA: Diagnosis not present

## 2016-04-11 DIAGNOSIS — M6281 Muscle weakness (generalized): Secondary | ICD-10-CM

## 2016-04-11 DIAGNOSIS — M25551 Pain in right hip: Secondary | ICD-10-CM | POA: Diagnosis not present

## 2016-04-11 DIAGNOSIS — R262 Difficulty in walking, not elsewhere classified: Secondary | ICD-10-CM

## 2016-04-11 DIAGNOSIS — R2689 Other abnormalities of gait and mobility: Secondary | ICD-10-CM

## 2016-04-11 NOTE — Therapy (Signed)
[facility] Outpatient Rehabilitation [facility] [address]  [address] [place], [place], [address] Phone: [phone]   Fax:  [phone]  Physical Therapy Evaluation  Patient Details  Name: Jessica Meyer MRN: [id number] Date of Birth: 08/01/1935 Referring Provider: Dr. Margarita Rana  Encounter Date: 04/11/2016      PT End of Session - 04/11/16 1830    Visit Number 1   Number of Visits 16   Date for PT Re-Evaluation 06/06/16   Authorization Type Medicare   PT Start Time 1405   PT Stop Time 1445   PT Time Calculation (min) 40 min   Activity Tolerance Patient tolerated treatment well   Behavior During Therapy [facility] for tasks assessed/performed      Past Medical History:  Diagnosis Date  . Arthritis   . Diabetes mellitus without complication (HCC)   . History of chicken pox   . Hyperlipidemia   . Hypertension   . Thyroid disease    hypothyroid    Past Surgical History:  Procedure Laterality Date  . ABDOMINAL HYSTERECTOMY    . HIP ARTHROPLASTY Right 01/26/2016   Procedure: ARTHROPLASTY BIPOLAR HIP (HEMIARTHROPLASTY);  Surgeon: Sheral Apley, MD;  Location: [facility] OR;  Service: Orthopedics;  Laterality: Right;  . KNEE ARTHROSCOPY Bilateral   . REPLACEMENT TOTAL KNEE BILATERAL     1998 left , 1992 right    There were no vitals filed for this visit.       Subjective Assessment - 04/11/16 1406    Subjective Patient fell in January - hip fracture with resultant R hemiarthroplasty - reports she tripped. Currently using a SPC.  Ambulates in the community with family - does not walk outside much. No issue within the home - recently removed shower chair. Has had no LOB or falls since surgery. Did have HHPT initially.    Patient is accompained by: Family member  son-in-law   Pertinent History HTN, DM   Limitations Standing;Walking   Patient Stated Goals improve pain and balance, return to gardening   Currently in Pain? Yes   Pain Score 7    Pain  Location Leg   Pain Orientation Right   Pain Type Acute pain   Pain Onset More than a month ago   Pain Frequency Intermittent   Aggravating Factors  prolonged positioning   Pain Relieving Factors heat            [facility] PT Assessment - 04/11/16 1411      Assessment   Medical Diagnosis s/p R hemiarthroplasty after fall   Referring Provider Dr. Margarita Rana   Onset Date/Surgical Date 01/26/16   Next MD Visit prn   Prior Therapy yes     Precautions   Precautions Posterior Hip   Precaution Comments patient and family member reports MD cleared patient of hip precautions     Restrictions   Weight Bearing Restrictions No     Balance Screen   Has the patient fallen in the past 6 months Yes   How many times? 1   Has the patient had a decrease in activity level because of a fear of falling?  No   Is the patient reluctant to leave their home because of a fear of falling?  No     Home Environment   Living Environment Private residence   Living Arrangements Alone   Type of Home House   Home Access Stairs to enter   Entrance Stairs-Rails Right   Home Layout One level  Home Equipment [place] Mor - single point     Prior Function   Level of Independence Independent   Vocation Retired   Leisure gardening     Cognition   Overall Cognitive Status Impaired/Different from baseline  family reports change in memory today;will cont. to assess     Observation/Other Assessments   Focus on Therapeutic Outcomes (FOTO)  Neuro: 58 (42% limited, predicted 35% limited)     Sensation   Light Touch Appears Intact     Coordination   Gross Motor Movements are Fluid and Coordinated Yes     Posture/Postural Control   Posture/Postural Control Postural limitations   Postural Limitations Rounded Shoulders;Forward head     ROM / Strength   AROM / PROM / Strength AROM;Strength     AROM   Overall AROM  Within functional limits for tasks performed   Overall AROM Comments B LE     Strength    Strength Assessment Site Hip;Knee   Right/Left Hip Right;Left   Right Hip Flexion 3+/5   Right Hip ABduction 3+/5   Right Hip ADduction 3+/5   Left Hip Flexion 4-/5   Left Hip ABduction 4-/5   Left Hip ADduction 4-/5   Right/Left Knee Right;Left   Right Knee Flexion 4/5   Right Knee Extension 4/5   Left Knee Flexion 4+/5   Left Knee Extension 4+/5     Ambulation/Gait   Ambulation/Gait Yes   Ambulation/Gait Assistance 5: Supervision   Ambulation Distance (Feet) 150 Feet   Assistive device Straight cane;None   Gait Pattern Step-through pattern;Decreased stride length;Decreased weight shift to right;Trunk flexed   Ambulation Surface Level;Indoor   Gait velocity 2.1 feet/second - no AD     Standardized Balance Assessment   Standardized Balance Assessment Berg Balance Test;Timed Up and Go Test;Five Times Sit to Stand   Five times sit to stand comments  15.85     Berg Balance Test   Sit to Stand Able to stand without using hands and stabilize independently   Standing Unsupported Able to stand safely 2 minutes   Sitting with Back Unsupported but Feet Supported on Floor or Stool Able to sit safely and securely 2 minutes   Stand to Sit Controls descent by using hands   Transfers Able to transfer safely, definite need of hands   Standing Unsupported with Eyes Closed Able to stand 10 seconds with supervision   Standing Ubsupported with Feet Together Needs help to attain position and unable to hold for 15 seconds   From Standing, Reach Forward with Outstretched Arm Loses balance while trying/requires external support   From Standing Position, Pick up Object from Floor Unable to pick up and needs supervision   From Standing Position, Turn to Look Behind Over each Shoulder Looks behind from both sides and weight shifts well   Turn 360 Degrees Needs close supervision or verbal cueing   Standing Unsupported, Alternately Place Feet on Step/Stool Needs assistance to keep from falling or unable  to try   Standing Unsupported, One Foot in Front Needs help to step but can hold 15 seconds   Standing on One Leg Tries to lift leg/unable to hold 3 seconds but remains standing independently   Total Score 29     Timed Up and Go Test   Normal TUG (seconds) 12.87   Manual TUG (seconds) 15.09   Cognitive TUG (seconds) 22.97  PT Education - 04/11/16 1829    Education provided Yes   Education Details exam findings, POC, initial HEP   Person(s) Educated Patient;Other (comment)   Methods Explanation;Demonstration;Handout   Comprehension Verbalized understanding;Returned demonstration;Verbal cues required;Need further instruction          PT Short Term Goals - 04/11/16 1837      PT SHORT TERM GOAL #1   Title patient to be independent with initial HEP (05/09/16)   Status New     PT SHORT TERM GOAL #2   Title Patient to improve Berg Balance score to 40/56 demonstrating improved balance (05/09/16)   Status New           PT Long Term Goals - 04/11/16 1838      PT LONG TERM GOAL #1   Title Patient to be independent with advanced HEP for balance and strengthening (06/06/16)   Status New     PT LONG TERM GOAL #2   Title Patient to improve Berg Balance to 49/56 demonstrating reduced fall risk (06/06/16)   Status New     PT LONG TERM GOAL #3   Title Patient to improve gait velocity to >2.62 feet/second (06/06/16)   Status New     PT LONG TERM GOAL #4   Title Patient to demonstrate ability to ambulate >500 feet on a variety of surfaces without use of AD with no evidence of instability or LOB demonstrating improved functional mobility (06/06/16)   Status New     PT LONG TERM GOAL #5   Title Patient to improve R hip strength to equal that of L hip with no increase in pain (06/06/16)   Status New     Additional Long Term Goals   Additional Long Term Goals Yes     PT LONG TERM GOAL #6   Title Patient to report ability to return to gardening  with no LOB (06/06/16)   Status New               Plan - 04/11/16 1831    Clinical Impression Statement Patient is a 81 y/o female presenting to OPPT today for initial evaluation s/p R hemiarthroplasty after fall with resultant hip fracture. Patient today with primary complaints of poor balance as well as R hip pain limiting functional mobility. Patient ambulating with SPC, however both patient and family member reporting intermittent use of AD. Patient today demonstrating some concerns with impulsiveness with activities leading to poor balance with tasks asked of her, reduced strength of R LE as ocmpared to L, poor balance with all scoring placing patient in "high fall risk" categories, as well as reduced functional mobility. Patients family member today concerned regarding patients memory and ability to recall daily activities, as he states she is not normally like this - PT to continue to assess this and make recommendations as needed. Patient to benefit from PT to address the above lsited deficits to allow patient to return to daily activities and leisure with imrpove balance and safety awareness for reduced fall risk.    Rehab Potential Good   PT Frequency 2x / week   PT Duration 8 weeks   PT Treatment/Interventions ADLs/Self Care Home Management;Cryotherapy;Electrical Stimulation;Moist Heat;Ultrasound;Neuromuscular re-education;Balance training;Therapeutic exercise;Therapeutic activities;Functional mobility training;Stair training;Gait training;DME Instruction;Patient/family education;Manual techniques;Scar mobilization;Passive range of motion;Vasopneumatic Device;Taping;Dry needling   Consulted and Agree with Plan of Care Patient      Patient will benefit from skilled therapeutic intervention in order to improve the following deficits and impairments:  Abnormal gait,  Decreased activity tolerance, Decreased balance, Decreased safety awareness, Decreased mobility, Decreased strength,  Difficulty walking, Increased muscle spasms, Pain  Visit Diagnosis: Pain in right hip - Plan: PT plan of care cert/re-cert  Difficulty in walking, not elsewhere classified - Plan: PT plan of care cert/re-cert  Unsteadiness on feet - Plan: PT plan of care cert/re-cert  Other abnormalities of gait and mobility - Plan: PT plan of care cert/re-cert  Muscle weakness (generalized) - Plan: PT plan of care cert/re-cert      G-Codes - Apr 26, 2016 1843    Functional Assessment Tool Used (Outpatient Only) FOTO: 42% limited   Functional Limitation Mobility: Walking and moving around   Mobility: Walking and Moving Around Current Status ([id number]) At least 40 percent but less than 60 percent impaired, limited or restricted   Mobility: Walking and Moving Around Goal Status [phone]) At least 20 percent but less than 40 percent impaired, limited or restricted       Problem List Patient Active Problem List   Diagnosis Date Noted  . Diabetes mellitus with hyperglycemia, with long-term current use of insulin (HCC) 01/29/2016  . Leukocytosis 01/25/2016  . Closed fracture of right hip (HCC)   . Diabetes mellitus with complication (HCC)   . Fall   . Left knee pain 05/28/2015  . Sprain of hand, third finger, right 05/28/2015  . HTN (hypertension) 07/29/2014  . Gait instability 07/29/2014  . Hyperlipidemia 07/29/2014  . Right knee pain 04/11/2014  . ASNHL (asymmetrical sensorineural hearing loss) 08/07/2013  . 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 Hernia, rectovaginal 08/07/2013  . Hypercholesterolemia without hypertriglyceridemia 08/07/2013  . Arthritis of knee, degenerative 08/07/2013  . Degenerative arthritis of lumbar spine 08/07/2013  . Osteopenia 08/07/2013  . Adult hypothyroidism 08/07/2013  . History of artificial joint 08/07/2013  . Essential (primary) hypertension 08/07/2013  . Depression with anxiety 08/07/2013     Kipp Laurence, PT, DPT 04/26/16 6:44 PM   [facility] [address]  [address] [place], [place], [address] Phone: [phone]   Fax:  [phone]  Name: Jessica Meyer MRN: [id number] Date of Birth: Apr 18, 1935

## 2016-04-11 NOTE — Patient Instructions (Signed)
HIP / KNEE: Flexion - Sitting    Raise knee to chest. Keep back straight, knee bent. __15_ reps per set, __2_ sets per day   Long Arc Quad    Straighten operated leg and try to hold it __5__ seconds. Use red band.  Repeat __15__ times. Do _2___ sessions a day.    Seated hip abduction    Seated with red band around knees - slow and controlled movement Repeat 15 times, 2 x a day

## 2016-04-13 ENCOUNTER — Ambulatory Visit: Payer: Medicare Other | Admitting: Physical Therapy

## 2016-04-13 DIAGNOSIS — R2681 Unsteadiness on feet: Secondary | ICD-10-CM

## 2016-04-13 DIAGNOSIS — R262 Difficulty in walking, not elsewhere classified: Secondary | ICD-10-CM

## 2016-04-13 DIAGNOSIS — M25551 Pain in right hip: Secondary | ICD-10-CM | POA: Diagnosis not present

## 2016-04-13 DIAGNOSIS — R2689 Other abnormalities of gait and mobility: Secondary | ICD-10-CM | POA: Diagnosis not present

## 2016-04-13 DIAGNOSIS — M6281 Muscle weakness (generalized): Secondary | ICD-10-CM | POA: Diagnosis not present

## 2016-04-13 NOTE — Therapy (Signed)
[facility] Outpatient Rehabilitation [facility] [address]  [address] [place], [place], [address] Phone: [phone]   Fax:  [phone]  Physical Therapy Treatment  Patient Details  Name: Jessica Meyer MRN: [id number] Date of Birth: 02-Jul-1935 Referring Provider: Dr. Margarita Rana  Encounter Date: 04/13/2016      PT End of Session - 04/13/16 1320    Visit Number 2   Number of Visits 16   Date for PT Re-Evaluation 06/06/16   Authorization Type Medicare   PT Start Time 1318   PT Stop Time 1401   PT Time Calculation (min) 43 min   Activity Tolerance Patient tolerated treatment well   Behavior During Therapy [facility] for tasks assessed/performed      Past Medical History:  Diagnosis Date  . Arthritis   . Diabetes mellitus without complication (HCC)   . History of chicken pox   . Hyperlipidemia   . Hypertension   . Thyroid disease    hypothyroid    Past Surgical History:  Procedure Laterality Date  . ABDOMINAL HYSTERECTOMY    . HIP ARTHROPLASTY Right 01/26/2016   Procedure: ARTHROPLASTY BIPOLAR HIP (HEMIARTHROPLASTY);  Surgeon: Sheral Apley, MD;  Location: [facility] OR;  Service: Orthopedics;  Laterality: Right;  . KNEE ARTHROSCOPY Bilateral   . REPLACEMENT TOTAL KNEE BILATERAL     1998 left , 1992 right    There were no vitals filed for this visit.      Subjective Assessment - 04/13/16 1320    Subjective Doesn't feel too well today - nothing related to hip   Patient is accompained by: Family member   Pertinent History HTN, DM   Patient Stated Goals improve pain and balance, return to gardening   Currently in Pain? No/denies   Pain Score 0-No pain                         [facility] Adult PT Treatment/Exercise - 04/13/16 1321      Exercises   Exercises Knee/Hip     Knee/Hip Exercises: Aerobic   Nustep L4 x 5 minutes     Knee/Hip Exercises: Standing   Side Lunges Limitations side stepping 30 feet each direction - no  resistance; retro stepping 30 feet - no resistance   SLS tandem B LE 2 x 30 seconds; narrow BOS 2 x 30 seconds     Knee/Hip Exercises: Seated   Hamstring Curl Strengthening;Right;15 reps   Hamstring Limitations red tband   Sit to Sand 15 reps;with UE support  standing on AirEx     Knee/Hip Exercises: Supine   Bridges Strengthening;Both;15 reps;2 sets   Bridges Limitations red tband at knees - heavy VC    Straight Leg Raises Strengthening;Right;2 sets;15 reps   Straight Leg Raises Limitations 1 set with 2#     Knee/Hip Exercises: Sidelying   Hip ABduction Strengthening;Right;2 sets;15 reps   Clams red tband - R LE 2 x 15 reps                  PT Short Term Goals - 04/11/16 1837      PT SHORT TERM GOAL #1   Title patient to be independent with initial HEP (05/09/16)   Status New     PT SHORT TERM GOAL #2   Title Patient to improve Berg Balance score to 40/56 demonstrating improved balance (05/09/16)   Status New           PT Long  Term Goals - 04/11/16 1838      PT LONG TERM GOAL #1   Title Patient to be independent with advanced HEP for balance and strengthening (06/06/16)   Status New     PT LONG TERM GOAL #2   Title Patient to improve Berg Balance to 49/56 demonstrating reduced fall risk (06/06/16)   Status New     PT LONG TERM GOAL #3   Title Patient to improve gait velocity to >2.62 feet/second (06/06/16)   Status New     PT LONG TERM GOAL #4   Title Patient to demonstrate ability to ambulate >500 feet on a variety of surfaces without use of AD with no evidence of instability or LOB demonstrating improved functional mobility (06/06/16)   Status New     PT LONG TERM GOAL #5   Title Patient to improve R hip strength to equal that of L hip with no increase in pain (06/06/16)   Status New     Additional Long Term Goals   Additional Long Term Goals Yes     PT LONG TERM GOAL #6   Title Patient to report ability to return to gardening with no LOB (06/06/16)    Status New               Plan - 04/13/16 1321    Clinical Impression Statement Ms. Ziegler doing well today. PT adjusting SPC to correct height today with patient able to use AD with greater efficiency. Patient doing well with all strengthening balance activities - does require heavy VC as well as TC to slow movements for greater muscle control as patient tends to be impulsive with movements. Will continue to beneift form further strneghtening and balance training.    PT Treatment/Interventions ADLs/Self Care Home Management;Cryotherapy;Electrical Stimulation;Moist Heat;Ultrasound;Neuromuscular re-education;Balance training;Therapeutic exercise;Therapeutic activities;Functional mobility training;Stair training;Gait training;DME Instruction;Patient/family education;Manual techniques;Scar mobilization;Passive range of motion;Vasopneumatic Device;Taping;Dry needling   Consulted and Agree with Plan of Care Patient      Patient will benefit from skilled therapeutic intervention in order to improve the following deficits and impairments:  Abnormal gait, Decreased activity tolerance, Decreased balance, Decreased safety awareness, Decreased mobility, Decreased strength, Difficulty walking, Increased muscle spasms, Pain  Visit Diagnosis: Pain in right hip  Difficulty in walking, not elsewhere classified  Unsteadiness on feet  Other abnormalities of gait and mobility  Muscle weakness (generalized)     Problem List Patient Active Problem List   Diagnosis Date Noted  . Diabetes mellitus with hyperglycemia, with long-term current use of insulin (HCC) 01/29/2016  . Leukocytosis 01/25/2016  . Closed fracture of right hip (HCC)   . Diabetes mellitus with complication (HCC)   . Fall   . Left knee pain 05/28/2015  . Sprain of hand, third finger, right 05/28/2015  . HTN (hypertension) 07/29/2014  . Gait instability 07/29/2014  . Hyperlipidemia 07/29/2014  . Right knee pain 04/11/2014  .  ASNHL (asymmetrical sensorineural hearing loss) 08/07/2013  . 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 Hernia, rectovaginal 08/07/2013  . Hypercholesterolemia without hypertriglyceridemia 08/07/2013  . Arthritis of knee, degenerative 08/07/2013  . Degenerative arthritis of lumbar spine 08/07/2013  . Osteopenia 08/07/2013  . Adult hypothyroidism 08/07/2013  . History of artificial joint 08/07/2013  . Essential (primary) hypertension 08/07/2013  . Depression with anxiety 08/07/2013     Kipp Laurence, PT, DPT 04/13/16 5:48 PM   [facility] Outpatient Rehabilitation [facility] [address]  [address] [place], [place], [address] Phone: [phone]  Fax:  [phone]  Name: Jessica Meyer MRN: [id number] Date of Birth: 1935/07/14

## 2016-04-13 NOTE — Patient Instructions (Signed)
Straight Leg Raise    Tighten stomach and slowly raise locked right leg __6__ inches from floor. Repeat __15__ times per set. Do _2___ sets per session.   Bridge    Lie back, legs bent. Inhale, pressing hips up. Keeping ribs in, lengthen lower back. Exhale, rolling down along spine from top. Repeat __15__ times. Do __2__ sessions per day.

## 2016-04-18 ENCOUNTER — Ambulatory Visit: Payer: Medicare Other | Admitting: Family

## 2016-04-18 ENCOUNTER — Ambulatory Visit: Payer: Medicare Other | Admitting: Physical Therapy

## 2016-04-18 DIAGNOSIS — R2681 Unsteadiness on feet: Secondary | ICD-10-CM | POA: Diagnosis not present

## 2016-04-18 DIAGNOSIS — R262 Difficulty in walking, not elsewhere classified: Secondary | ICD-10-CM | POA: Diagnosis not present

## 2016-04-18 DIAGNOSIS — R2689 Other abnormalities of gait and mobility: Secondary | ICD-10-CM | POA: Diagnosis not present

## 2016-04-18 DIAGNOSIS — M6281 Muscle weakness (generalized): Secondary | ICD-10-CM

## 2016-04-18 DIAGNOSIS — M25551 Pain in right hip: Secondary | ICD-10-CM | POA: Diagnosis not present

## 2016-04-18 NOTE — Therapy (Signed)
[facility] [address]  [address] [place], [place], [address] Phone: [phone]   Fax:  [phone]  Physical Therapy Treatment  Patient Details  Name: Jessica Meyer MRN: [id number] Date of Birth: 02-01-1935 Referring Provider: Dr. Margarita Rana  Encounter Date: 04/18/2016      PT End of Session - 04/18/16 1325    Visit Number 3   Number of Visits 16   Date for PT Re-Evaluation 06/06/16   Authorization Type Medicare   PT Start Time 1322   PT Stop Time 1403   PT Time Calculation (min) 41 min   Activity Tolerance Patient tolerated treatment well   Behavior During Therapy [facility] for tasks assessed/performed      Past Medical History:  Diagnosis Date  . Arthritis   . Diabetes mellitus without complication (HCC)   . History of chicken pox   . Hyperlipidemia   . Hypertension   . Thyroid disease    hypothyroid    Past Surgical History:  Procedure Laterality Date  . ABDOMINAL HYSTERECTOMY    . HIP ARTHROPLASTY Right 01/26/2016   Procedure: ARTHROPLASTY BIPOLAR HIP (HEMIARTHROPLASTY);  Surgeon: Sheral Apley, MD;  Location: [facility] OR;  Service: Orthopedics;  Laterality: Right;  . KNEE ARTHROSCOPY Bilateral   . REPLACEMENT TOTAL KNEE BILATERAL     1998 left , 1992 right    There were no vitals filed for this visit.      Subjective Assessment - 04/18/16 1325    Subjective feeling well - no falls - reports she has been doing HEP   Patient is accompained by: Family member   Pertinent History HTN, DM   Patient Stated Goals improve pain and balance, return to gardening   Currently in Pain? No/denies   Pain Score 0-No pain                         [facility] Adult PT Treatment/Exercise - 04/18/16 1326      Knee/Hip Exercises: Stretches   Passive Hamstring Stretch Right;3 reps;30 seconds   Passive Hamstring Stretch Limitations supine with strap     Knee/Hip Exercises: Aerobic   Recumbent Bike L2 x 6  minutes     Knee/Hip Exercises: Seated   Long Arc Quad Strengthening;Right;15 reps   Long Arc Quad Weight 2 lbs.   Long [employer] Limitations with ball squeeze   Hamstring Curl Strengthening;Right;15 reps   Hamstring Limitations green tband   Sit to [employer] 15 reps;with UE support  onto [employer] - pushing up with 1 hand     Knee/Hip Exercises: Supine   Bridges Strengthening;Both;15 reps   Bridges Limitations green tband at knees   Straight Leg Raises Strengthening;Both;15 reps   Straight Leg Raises Limitations 2#   Other Supine Knee/Hip Exercises straight leg bridge on peanut ball x 15 reps     Knee/Hip Exercises: Sidelying   Hip ABduction Strengthening;Right;15 reps   Hip ABduction Limitations 2#   Clams green tband - R LE x 15 reps     Knee/Hip Exercises: Prone   Hamstring Curl 1 set;15 reps   Hamstring Curl Limitations 4#   Hip Extension Strengthening;Right;15 reps   Hip Extension Limitations knee straight                  PT Short Term Goals - 04/11/16 1837      PT SHORT TERM GOAL #1   Title patient to be independent with initial  HEP (05/09/16)   Status New     PT SHORT TERM GOAL #2   Title Patient to improve Berg Balance score to 40/56 demonstrating improved balance (05/09/16)   Status New           PT Long Term Goals - 04/11/16 1838      PT LONG TERM GOAL #1   Title Patient to be independent with advanced HEP for balance and strengthening (06/06/16)   Status New     PT LONG TERM GOAL #2   Title Patient to improve Berg Balance to 49/56 demonstrating reduced fall risk (06/06/16)   Status New     PT LONG TERM GOAL #3   Title Patient to improve gait velocity to >2.62 feet/second (06/06/16)   Status New     PT LONG TERM GOAL #4   Title Patient to demonstrate ability to ambulate >500 feet on a variety of surfaces without use of AD with no evidence of instability or LOB demonstrating improved functional mobility (06/06/16)   Status New     PT LONG TERM GOAL #5    Title Patient to improve R hip strength to equal that of L hip with no increase in pain (06/06/16)   Status New     Additional Long Term Goals   Additional Long Term Goals Yes     PT LONG TERM GOAL #6   Title Patient to report ability to return to gardening with no LOB (06/06/16)   Status New               Plan - 04/18/16 1325    Clinical Impression Statement Patient doing well today. Does continue to have some impulsiveness requiring heavy VC and TC for proper muscle control and form of movement. Doing well with all strengtheningprogressions - with no increase in pain, only appropriate muscle fatigue following each task. Given HS stretch as well as strengthening HEP handout today target to hamstrings and glute with good carryover.    PT Treatment/Interventions ADLs/Self Care Home Management;Cryotherapy;Electrical Stimulation;Moist Heat;Ultrasound;Neuromuscular re-education;Balance training;Therapeutic exercise;Therapeutic activities;Functional mobility training;Stair training;Gait training;DME Instruction;Patient/family education;Manual techniques;Scar mobilization;Passive range of motion;Vasopneumatic Device;Taping;Dry needling   Consulted and Agree with Plan of Care Patient      Patient will benefit from skilled therapeutic intervention in order to improve the following deficits and impairments:  Abnormal gait, Decreased activity tolerance, Decreased balance, Decreased safety awareness, Decreased mobility, Decreased strength, Difficulty walking, Increased muscle spasms, Pain  Visit Diagnosis: Pain in right hip  Difficulty in walking, not elsewhere classified  Unsteadiness on feet  Other abnormalities of gait and mobility  Muscle weakness (generalized)     Problem List Patient Active Problem List   Diagnosis Date Noted  . Diabetes mellitus with hyperglycemia, with long-term current use of insulin (HCC) 01/29/2016  . Leukocytosis 01/25/2016  . Closed fracture of right hip  (HCC)   . Diabetes mellitus with complication (HCC)   . Fall   . Left knee pain 05/28/2015  . Sprain of hand, third finger, right 05/28/2015  . HTN (hypertension) 07/29/2014  . Gait instability 07/29/2014  . Hyperlipidemia 07/29/2014  . Right knee pain 04/11/2014  . ASNHL (asymmetrical sensorineural hearing loss) 08/07/2013  . 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 Hernia, rectovaginal 08/07/2013  . Hypercholesterolemia without hypertriglyceridemia 08/07/2013  . Arthritis of knee, degenerative 08/07/2013  . Degenerative arthritis of lumbar spine 08/07/2013  . Osteopenia 08/07/2013  . Adult hypothyroidism 08/07/2013  . History of artificial joint 08/07/2013  . Essential (primary) hypertension 08/07/2013  .  Depression with anxiety 08/07/2013     Kipp Laurence, PT, DPT 04/18/16 2:21 PM   [facility] [address]  [address] [place], [place], [address] Phone: [phone]   Fax:  [phone]  Name: Charika Mikelson MRN: [id number] Date of Birth: Jun 18, 1935

## 2016-04-18 NOTE — Patient Instructions (Signed)
Hamstring Step 2    Left foot relaxed, knee straight, other leg bent, foot flat. Raise straight leg further upward to maximal range. Hold _30__ seconds. Relax leg completely down. Repeat __3_ times.  \Hip Extension    Lying face down, raise leg just off floor. Keep leg straight. Hold 1 count. Lower leg to floor. Repeat _15___ times each leg. Optional: Place small pillow under abdomen.    Leg Curl: Prone (Single Leg)    Lying on stomach, anchor tubing around one foot. Loop around other ankle with twist. Bend same knee up. Repeat _15_ times per set. Repeat with other leg. Do _2_ sets per session.

## 2016-04-20 ENCOUNTER — Ambulatory Visit: Payer: Medicare Other | Admitting: Physical Therapy

## 2016-04-25 ENCOUNTER — Ambulatory Visit: Payer: Medicare Other | Admitting: Physical Therapy

## 2016-04-25 DIAGNOSIS — M25551 Pain in right hip: Secondary | ICD-10-CM

## 2016-04-25 DIAGNOSIS — R262 Difficulty in walking, not elsewhere classified: Secondary | ICD-10-CM | POA: Diagnosis not present

## 2016-04-25 DIAGNOSIS — R2681 Unsteadiness on feet: Secondary | ICD-10-CM

## 2016-04-25 DIAGNOSIS — R2689 Other abnormalities of gait and mobility: Secondary | ICD-10-CM | POA: Diagnosis not present

## 2016-04-25 DIAGNOSIS — M6281 Muscle weakness (generalized): Secondary | ICD-10-CM | POA: Diagnosis not present

## 2016-04-25 NOTE — Therapy (Signed)
[facility] [address]  [address] [place], [place], [address] Phone: [phone]   Fax:  [phone]  Physical Therapy Treatment  Patient Details  Name: Jessica Meyer MRN: [id number] Date of Birth: 1935/05/03 Referring Provider: Dr. Margarita Rana  Encounter Date: 04/25/2016      PT End of Session - 04/25/16 1414    Visit Number 4   Number of Visits 16   Date for PT Re-Evaluation 06/06/16   Authorization Type Medicare   PT Start Time 1410   PT Stop Time 1451   PT Time Calculation (min) 41 min   Activity Tolerance Patient tolerated treatment well   Behavior During Therapy [facility] for tasks assessed/performed      Past Medical History:  Diagnosis Date  . Arthritis   . Diabetes mellitus without complication (HCC)   . History of chicken pox   . Hyperlipidemia   . Hypertension   . Thyroid disease    hypothyroid    Past Surgical History:  Procedure Laterality Date  . ABDOMINAL HYSTERECTOMY    . HIP ARTHROPLASTY Right 01/26/2016   Procedure: ARTHROPLASTY BIPOLAR HIP (HEMIARTHROPLASTY);  Surgeon: Sheral Apley, MD;  Location: [facility] OR;  Service: Orthopedics;  Laterality: Right;  . KNEE ARTHROSCOPY Bilateral   . REPLACEMENT TOTAL KNEE BILATERAL     1998 left , 1992 right    There were no vitals filed for this visit.      Subjective Assessment - 04/25/16 1411    Subjective feeling well today - no pain.    Patient is accompained by: Family member   Pertinent History HTN, DM   Patient Stated Goals improve pain and balance, return to gardening   Currently in Pain? No/denies   Pain Score 0-No pain                         [facility] Adult PT Treatment/Exercise - 04/25/16 0001      Knee/Hip Exercises: Aerobic   Nustep L4 x 5 minutes     Knee/Hip Exercises: Supine   Bridges Both;10 reps   Bridges Limitations review of form             Balance Exercises - 04/25/16 1427      OTAGO PROGRAM   Head Movements Standing;5 reps   Neck Movements Standing;5 reps   Back Extension Standing;5 reps   Trunk Movements Standing;5 reps   Ankle Movements Sitting;10 reps   Knee Extensor 10 reps   Knee Flexor 10 reps   Hip ABductor 10 reps   Ankle Plantorflexors 20 reps, support   Ankle Dorsiflexors 20 reps, support   Knee Bends 10 reps, support   Backwards Walking Support   Walking and Turning Around No assistive device   Sideways Walking Assistive device   Tandem Stance 10 seconds, support   Tandem Walk Support   One Leg Stand 10 seconds, support   Heel Walking Support   Toe Walk Support   Heel Toe Walking Backward --  support   Sit to Stand 10 reps, bilateral support             PT Short Term Goals - 04/25/16 1454      PT SHORT TERM GOAL #1   Title patient to be independent with initial HEP (05/09/16)   Status On-going     PT SHORT TERM GOAL #2   Title Patient to improve Berg Balance score to 40/56 demonstrating improved balance (  05/09/16)   Status On-going           PT Long Term Goals - 04/25/16 1454      PT LONG TERM GOAL #1   Title Patient to be independent with advanced HEP for balance and strengthening (06/06/16)   Status On-going     PT LONG TERM GOAL #2   Title Patient to improve Berg Balance to 49/56 demonstrating reduced fall risk (06/06/16)   Status On-going     PT LONG TERM GOAL #3   Title Patient to improve gait velocity to >2.62 feet/second (06/06/16)   Status On-going     PT LONG TERM GOAL #4   Title Patient to demonstrate ability to ambulate >500 feet on a variety of surfaces without use of AD with no evidence of instability or LOB demonstrating improved functional mobility (06/06/16)   Status On-going     PT LONG TERM GOAL #5   Title Patient to improve R hip strength to equal that of L hip with no increase in pain (06/06/16)   Status On-going     PT LONG TERM GOAL #6   Title Patient to report ability to return to gardening with no LOB (06/06/16)    Status On-going               Plan - 04/25/16 1454    Clinical Impression Statement OTAGO program introduced today in its entirety with patient able to perform all tasks with use of counter for support - some slight LOB requring VC to slow movements to ensure good balance and overall good quality movements. Patient reporitng difficulty with bridge exercsies at home - reviewed with VC needed to increase hip/knee flexion for proper completion of task. Will plan to continue strengthening program at next session.    PT Treatment/Interventions ADLs/Self Care Home Management;Cryotherapy;Electrical Stimulation;Moist Heat;Ultrasound;Neuromuscular re-education;Balance training;Therapeutic exercise;Therapeutic activities;Functional mobility training;Stair training;Gait training;DME Instruction;Patient/family education;Manual techniques;Scar mobilization;Passive range of motion;Vasopneumatic Device;Taping;Dry needling   Consulted and Agree with Plan of Care Patient      Patient will benefit from skilled therapeutic intervention in order to improve the following deficits and impairments:  Abnormal gait, Decreased activity tolerance, Decreased balance, Decreased safety awareness, Decreased mobility, Decreased strength, Difficulty walking, Increased muscle spasms, Pain  Visit Diagnosis: Pain in right hip  Difficulty in walking, not elsewhere classified  Unsteadiness on feet  Other abnormalities of gait and mobility  Muscle weakness (generalized)     Problem List Patient Active Problem List   Diagnosis Date Noted  . Diabetes mellitus with hyperglycemia, with long-term current use of insulin (HCC) 01/29/2016  . Leukocytosis 01/25/2016  . Closed fracture of right hip (HCC)   . Diabetes mellitus with complication (HCC)   . Fall   . Left knee pain 05/28/2015  . Sprain of hand, third finger, right 05/28/2015  . HTN (hypertension) 07/29/2014  . Gait instability 07/29/2014  . Hyperlipidemia  07/29/2014  . Right knee pain 04/11/2014  . ASNHL (asymmetrical sensorineural hearing loss) 08/07/2013  . 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 Hernia, rectovaginal 08/07/2013  . Hypercholesterolemia without hypertriglyceridemia 08/07/2013  . Arthritis of knee, degenerative 08/07/2013  . Degenerative arthritis of lumbar spine 08/07/2013  . Osteopenia 08/07/2013  . Adult hypothyroidism 08/07/2013  . History of artificial joint 08/07/2013  . Essential (primary) hypertension 08/07/2013  . Depression with anxiety 08/07/2013     Kipp Laurence, PT, DPT 04/25/16 2:59 PM   [facility] [address]  [phone]  [place], [place], [address] Phone: [phone]   Fax:  [phone]  Name: Ivanka Kirshner MRN: [id number] Date of Birth: 1935/03/24

## 2016-04-27 ENCOUNTER — Ambulatory Visit: Payer: Medicare Other | Admitting: Physical Therapy

## 2016-04-27 DIAGNOSIS — R2681 Unsteadiness on feet: Secondary | ICD-10-CM

## 2016-04-27 DIAGNOSIS — R2689 Other abnormalities of gait and mobility: Secondary | ICD-10-CM | POA: Diagnosis not present

## 2016-04-27 DIAGNOSIS — R262 Difficulty in walking, not elsewhere classified: Secondary | ICD-10-CM | POA: Diagnosis not present

## 2016-04-27 DIAGNOSIS — M25551 Pain in right hip: Secondary | ICD-10-CM

## 2016-04-27 DIAGNOSIS — M6281 Muscle weakness (generalized): Secondary | ICD-10-CM

## 2016-04-27 NOTE — Therapy (Signed)
[facility] Out[facility] [address]  [address] [place], [place], [address] Phone: [phone]   Fax:  [phone]  Physical Therapy Treatment  Patient Details  Name: Jessica Meyer MRN: [id number] Date of Birth: 1935-09-10 Referring Provider: Dr. Margarita Rana  Encounter Date: 04/27/2016      PT End of Session - 04/27/16 1321    Visit Number 5   Number of Visits 16   Date for PT Re-Evaluation 06/06/16   Authorization Type Medicare   PT Start Time 1316   PT Stop Time 1358   PT Time Calculation (min) 42 min   Activity Tolerance Patient tolerated treatment well   Behavior During Therapy [facility] for tasks assessed/performed      Past Medical History:  Diagnosis Date  . Arthritis   . Diabetes mellitus without complication (HCC)   . History of chicken pox   . Hyperlipidemia   . Hypertension   . Thyroid disease    hypothyroid    Past Surgical History:  Procedure Laterality Date  . ABDOMINAL HYSTERECTOMY    . HIP ARTHROPLASTY Right 01/26/2016   Procedure: ARTHROPLASTY BIPOLAR HIP (HEMIARTHROPLASTY);  Surgeon: Sheral Apley, MD;  Location: [facility] OR;  Service: Orthopedics;  Laterality: Right;  . KNEE ARTHROSCOPY Bilateral   . REPLACEMENT TOTAL KNEE BILATERAL     1998 left , 1992 right    There were no vitals filed for this visit.      Subjective Assessment - 04/27/16 1319    Subjective Feels like the front of her hip is tight - feels like she is always leaning forwards   Patient is accompained by: Family member   Pertinent History HTN, DM   Patient Stated Goals improve pain and balance, return to gardening   Currently in Pain? No/denies   Pain Score 0-No pain                         [facility] Adult PT Treatment/Exercise - 04/27/16 1322      Neuro Re-ed    Neuro Re-ed Details  side stepping on balance beam foam 3 complete laps; fwd/bwd walking on balance beam foam 3 complete laps; standing on foam with reaching  and tossing; backwards wlaking 2 x 30 feet; tandem walking 2 x 30 feet; knocking over and sitting up cones 2 x 8; double taps to cones with SL stance 2 x 8     Knee/Hip Exercises: Stretches   Hip Flexor Stretch Right;3 reps;30 seconds   Hip Flexor Stretch Limitations 3 reps with use of chair; 3 reps with leg off edge of mat table     Knee/Hip Exercises: Aerobic   Nustep L6 x 6 minutes     Knee/Hip Exercises: Standing   Side Lunges Limitations 30 feet x 2 each direction - red tband - emphasis on posture and correct form                  PT Short Term Goals - 04/25/16 1454      PT SHORT TERM GOAL #1   Title patient to be independent with initial HEP (05/09/16)   Status On-going     PT SHORT TERM GOAL #2   Title Patient to improve Berg Balance score to 40/56 demonstrating improved balance (05/09/16)   Status On-going           PT Long Term Goals - 04/25/16 1454      PT LONG TERM GOAL #  1   Title Patient to be independent with advanced HEP for balance and strengthening (06/06/16)   Status On-going     PT LONG TERM GOAL #2   Title Patient to improve Berg Balance to 49/56 demonstrating reduced fall risk (06/06/16)   Status On-going     PT LONG TERM GOAL #3   Title Patient to improve gait velocity to >2.62 feet/second (06/06/16)   Status On-going     PT LONG TERM GOAL #4   Title Patient to demonstrate ability to ambulate >500 feet on a variety of surfaces without use of AD with no evidence of instability or LOB demonstrating improved functional mobility (06/06/16)   Status On-going     PT LONG TERM GOAL #5   Title Patient to improve R hip strength to equal that of L hip with no increase in pain (06/06/16)   Status On-going     PT LONG TERM GOAL #6   Title Patient to report ability to return to gardening with no LOB (06/06/16)   Status On-going               Plan - 04/27/16 1321    Clinical Impression Statement Jessica Meyer doing well today with balance training - does have  a tendency to look at feet as well as retropulse during balance activites - requires Min-Mod A to maintain upright balance when losing control. Patient doing better at slowing down movements and being more aware of actions which will ultimately help reduce fall risk.    PT Treatment/Interventions ADLs/Self Care Home Management;Cryotherapy;Electrical Stimulation;Moist Heat;Ultrasound;Neuromuscular re-education;Balance training;Therapeutic exercise;Therapeutic activities;Functional mobility training;Stair training;Gait training;DME Instruction;Patient/family education;Manual techniques;Scar mobilization;Passive range of motion;Vasopneumatic Device;Taping;Dry needling   Consulted and Agree with Plan of Care Patient      Patient will benefit from skilled therapeutic intervention in order to improve the following deficits and impairments:  Abnormal gait, Decreased activity tolerance, Decreased balance, Decreased safety awareness, Decreased mobility, Decreased strength, Difficulty walking, Increased muscle spasms, Pain  Visit Diagnosis: Pain in right hip  Difficulty in walking, not elsewhere classified  Unsteadiness on feet  Other abnormalities of gait and mobility  Muscle weakness (generalized)     Problem List Patient Active Problem List   Diagnosis Date Noted  . Diabetes mellitus with hyperglycemia, with long-term current use of insulin (HCC) 01/29/2016  . Leukocytosis 01/25/2016  . Closed fracture of right hip (HCC)   . Diabetes mellitus with complication (HCC)   . Fall   . Left knee pain 05/28/2015  . Sprain of hand, third finger, right 05/28/2015  . HTN (hypertension) 07/29/2014  . Gait instability 07/29/2014  . Hyperlipidemia 07/29/2014  . Right knee pain 04/11/2014  . ASNHL (asymmetrical sensorineural hearing loss) 08/07/2013  . 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 Hernia, rectovaginal 08/07/2013  . Hypercholesterolemia without hypertriglyceridemia 08/07/2013  . Arthritis of knee,  degenerative 08/07/2013  . Degenerative arthritis of lumbar spine 08/07/2013  . Osteopenia 08/07/2013  . Adult hypothyroidism 08/07/2013  . History of artificial joint 08/07/2013  . Essential (primary) hypertension 08/07/2013  . Depression with anxiety 08/07/2013    Kipp Laurence, PT, DPT 04/27/16 4:09 PM    [facility] [address]  [address] [place], [place], [address] Phone: [phone]   Fax:  [phone]  Name: Jessica Meyer MRN: [id number] Date of Birth: [birth year]

## 2016-04-28 ENCOUNTER — Other Ambulatory Visit: Payer: Self-pay | Admitting: Family

## 2016-04-28 NOTE — Telephone Encounter (Signed)
Refill sent per [facility] refill protocol/SLS LOV: 03/07/16 ROV: 07/18/16

## 2016-04-28 NOTE — Telephone Encounter (Signed)
Refill sent per [facility] refill protocol/SLS  

## 2016-05-03 ENCOUNTER — Ambulatory Visit: Payer: Medicare Other | Attending: Orthopedic Surgery | Admitting: Physical Therapy

## 2016-05-03 DIAGNOSIS — R2689 Other abnormalities of gait and mobility: Secondary | ICD-10-CM | POA: Diagnosis not present

## 2016-05-03 DIAGNOSIS — R262 Difficulty in walking, not elsewhere classified: Secondary | ICD-10-CM | POA: Diagnosis not present

## 2016-05-03 DIAGNOSIS — R2681 Unsteadiness on feet: Secondary | ICD-10-CM

## 2016-05-03 DIAGNOSIS — M6281 Muscle weakness (generalized): Secondary | ICD-10-CM | POA: Insufficient documentation

## 2016-05-03 DIAGNOSIS — M25551 Pain in right hip: Secondary | ICD-10-CM | POA: Insufficient documentation

## 2016-05-03 NOTE — Therapy (Signed)
[facility] [address]  [address] [place], [place], [address] Phone: [phone]   Fax:  [phone]  Physical Therapy Treatment  Patient Details  Name: Jessica Meyer MRN: [id number] Date of Birth: 1935/01/13 Referring Provider: Dr. Margarita Rana  Encounter Date: 05/03/2016      PT End of Session - 05/03/16 1557    Visit Number 6   Number of Visits 16   Date for PT Re-Evaluation 06/06/16   Authorization Type Medicare   PT Start Time 1314   PT Stop Time 1357   PT Time Calculation (min) 43 min   Activity Tolerance Patient tolerated treatment well   Behavior During Therapy [facility] for tasks assessed/performed      Past Medical History:  Diagnosis Date  . Arthritis   . Diabetes mellitus without complication (HCC)   . History of chicken pox   . Hyperlipidemia   . Hypertension   . Thyroid disease    hypothyroid    Past Surgical History:  Procedure Laterality Date  . ABDOMINAL HYSTERECTOMY    . HIP ARTHROPLASTY Right 01/26/2016   Procedure: ARTHROPLASTY BIPOLAR HIP (HEMIARTHROPLASTY);  Surgeon: Sheral Apley, MD;  [facility];  Service: Orthopedics;  Laterality: Right;  . KNEE ARTHROSCOPY Bilateral   . REPLACEMENT TOTAL KNEE BILATERAL     1998 left , 1992 right    There were no vitals filed for this visit.      Subjective Assessment - 05/03/16 1312    Subjective Feeling well - had a good weekend - no complaints   Patient is accompained by: Family member   Pertinent History HTN, DM   Patient Stated Goals improve pain and balance, return to gardening   Currently in Pain? No/denies   Pain Score 0-No pain                         [facility] Adult PT Treatment/Exercise - 05/03/16 1320      Knee/Hip Exercises: Aerobic   Nustep L5 x 6 minutes      Knee/Hip Exercises: Standing   Hip Flexion Stengthening;Right;Left;15 reps;Knee straight   Hip Flexion Limitations red tband - UE support for  balance   Hip ADduction Strengthening;Right;Left;15 reps   Hip ADduction Limitations red tband - UE support for balance   Hip Abduction Stengthening;Right;Left;15 reps;Knee straight   Abduction Limitations red tband - UE support for balance   Hip Extension Stengthening;Right;Left;15 reps;Knee straight   Extension Limitations red tband - UE support for balance   Functional Squat 20 reps   Functional Squat Limitations VC and TC for form - tendency for hip internal rotation and adduction   Walking with Sports Cord forward moster walks - 2 x 30 feet - B UE support - heavy VC for large steps; side stepping 30 feet x 2 - B UE support, heavy VC for large steps and proper glute activation     Knee/Hip Exercises: Seated   Long Programmer, applications reps   Long Arc Quad Weight 4 lbs.   Long Arc Quad Limitations with ball squeeze                  PT Short Term Goals - 04/25/16 1454      PT SHORT TERM GOAL #1   Title patient to be independent with initial HEP (05/09/16)   Status On-going     PT SHORT TERM GOAL #2   Title Patient to improve  Berg Balance score to 40/56 demonstrating improved balance (05/09/16)   Status On-going           PT Long Term Goals - 04/25/16 1454      PT LONG TERM GOAL #1   Title Patient to be independent with advanced HEP for balance and strengthening (06/06/16)   Status On-going     PT LONG TERM GOAL #2   Title Patient to improve Berg Balance to 49/56 demonstrating reduced fall risk (06/06/16)   Status On-going     PT LONG TERM GOAL #3   Title Patient to improve gait velocity to >2.62 feet/second (06/06/16)   Status On-going     PT LONG TERM GOAL #4   Title Patient to demonstrate ability to ambulate >500 feet on a variety of surfaces without use of AD with no evidence of instability or LOB demonstrating improved functional mobility (06/06/16)   Status On-going     PT LONG TERM GOAL #5   Title Patient to improve R hip strength to equal that of  L hip with no increase in pain (06/06/16)   Status On-going     PT LONG TERM GOAL #6   Title Patient to report ability to return to gardening with no LOB (06/06/16)   Status On-going               Plan - 05/03/16 1557    Clinical Impression Statement Patient doing well today - much time spent on general standing hip strength. Much VC and TC needed for proper form of movement as well as education to slow movements to reinforce carryover as well as good muscle control. Discussion wiht family member on possible need for more sessions to reduce general fall risk.    PT Treatment/Interventions ADLs/Self Care Home Management;Cryotherapy;Electrical Stimulation;Moist Heat;Ultrasound;Neuromuscular re-education;Balance training;Therapeutic exercise;Therapeutic activities;Functional mobility training;Stair training;Gait training;DME Instruction;Patient/family education;Manual techniques;Scar mobilization;Passive range of motion;Vasopneumatic Device;Taping;Dry needling   Consulted and Agree with Plan of Care Patient      Patient will benefit from skilled therapeutic intervention in order to improve the following deficits and impairments:  Abnormal gait, Decreased activity tolerance, Decreased balance, Decreased safety awareness, Decreased mobility, Decreased strength, Difficulty walking, Increased muscle spasms, Pain  Visit Diagnosis: Pain in right hip  Difficulty in walking, not elsewhere classified  Unsteadiness on feet  Other abnormalities of gait and mobility  Muscle weakness (generalized)     Problem List Patient Active Problem List   Diagnosis Date Noted  . Diabetes mellitus with hyperglycemia, with long-term current use of insulin (HCC) 01/29/2016  . Leukocytosis 01/25/2016  . Closed fracture of right hip (HCC)   . Diabetes mellitus with complication (HCC)   . Fall   . Left knee pain 05/28/2015  . Sprain of hand, third finger, right 05/28/2015  . HTN (hypertension) 07/29/2014   . Gait instability 07/29/2014  . Hyperlipidemia 07/29/2014  . Right knee pain 04/11/2014  . ASNHL (asymmetrical sensorineural hearing loss) 08/07/2013  . 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 Hernia, rectovaginal 08/07/2013  . Hypercholesterolemia without hypertriglyceridemia 08/07/2013  . Arthritis of knee, degenerative 08/07/2013  . Degenerative arthritis of lumbar spine 08/07/2013  . Osteopenia 08/07/2013  . Adult hypothyroidism 08/07/2013  . History of artificial joint 08/07/2013  . Essential (primary) hypertension 08/07/2013  . Depression with anxiety 08/07/2013    Kipp Laurence, PT, DPT 05/03/16 4:00 PM   [facility] [address]  [address] [place], [place], [address] Phone: [phone]  Fax:  [phone]  Name: Victorine Mcnee MRN: [id number] Date of Birth: 09-05-1935

## 2016-05-05 ENCOUNTER — Ambulatory Visit: Payer: Medicare Other | Admitting: Physical Therapy

## 2016-05-05 DIAGNOSIS — R2681 Unsteadiness on feet: Secondary | ICD-10-CM | POA: Diagnosis not present

## 2016-05-05 DIAGNOSIS — R2689 Other abnormalities of gait and mobility: Secondary | ICD-10-CM

## 2016-05-05 DIAGNOSIS — M25551 Pain in right hip: Secondary | ICD-10-CM

## 2016-05-05 DIAGNOSIS — R262 Difficulty in walking, not elsewhere classified: Secondary | ICD-10-CM

## 2016-05-05 DIAGNOSIS — M6281 Muscle weakness (generalized): Secondary | ICD-10-CM

## 2016-05-05 NOTE — Therapy (Signed)
[facility] [address]  [address] [place], [place], [address] Phone: [phone]   Fax:  [phone]  Physical Therapy Treatment  Patient Details  Name: Jessica Meyer MRN: [id number] Date of Birth: 11-02-1935 Referring Provider: Dr. Margarita Rana  Encounter Date: 05/05/2016      PT End of Session - 05/05/16 1554    Visit Number 7   Number of Visits 16   Date for PT Re-Evaluation 06/06/16   Authorization Type Medicare   PT Start Time 1313   PT Stop Time 1358   PT Time Calculation (min) 45 min   Activity Tolerance Patient tolerated treatment well   Behavior During Therapy [facility] for tasks assessed/performed      Past Medical History:  Diagnosis Date  . Arthritis   . Diabetes mellitus without complication (HCC)   . History of chicken pox   . Hyperlipidemia   . Hypertension   . Thyroid disease    hypothyroid    Past Surgical History:  Procedure Laterality Date  . ABDOMINAL HYSTERECTOMY    . HIP ARTHROPLASTY Right 01/26/2016   Procedure: ARTHROPLASTY BIPOLAR HIP (HEMIARTHROPLASTY);  Surgeon: Sheral Apley, MD;  Location: [facility] OR;  Service: Orthopedics;  Laterality: Right;  . KNEE ARTHROSCOPY Bilateral   . REPLACEMENT TOTAL KNEE BILATERAL     1998 left , 1992 right    There were no vitals filed for this visit.      Subjective Assessment - 05/05/16 1548    Subjective Doing well - out in the yard today with no issue; uses a stool while workingin the garden    Patient is accompained by: Family member   Pertinent History HTN, DM   Patient Stated Goals improve pain and balance, return to gardening   Currently in Pain? No/denies   Pain Score 0-No pain                         [facility] Adult PT Treatment/Exercise - 05/05/16 0001      Knee/Hip Exercises: Aerobic   Nustep L6 x 6 minutes      Knee/Hip Exercises: Standing   Other Standing Knee Exercises toe clears to 9" stool - alternating LE; 1 set  x 20 on firm surface; 1 set x 20 on foam surface   Other Standing Knee Exercises step up and over foam and 9" stool - 8 laps - most difficulty when standing atop stool and then stepping down (balance)             Balance Exercises - 05/05/16 1550      Balance Exercises: Standing   Standing Eyes Opened Solid surface;5 reps;30 secs  PT providing perturbations - good stepping strategy   SLS Eyes open  1 foot on pebble - good R LE; heavy retropulsion L LE   Balance Beam side stepping - 4 laps; fwd/bwd stepping x 4 laps    Cone Rotation Limitations knocking over sititng up cones 1 x 8 each LE; double taps to cones 1 x 8 each LE   Other Standing Exercises standing on foam - hitting beach ball x 2 minutes - no LOB             PT Short Term Goals - 04/25/16 1454      PT SHORT TERM GOAL #1   Title patient to be independent with initial HEP (05/09/16)   Status On-going     PT SHORT TERM GOAL #  2   Title Patient to improve Berg Balance score to 40/56 demonstrating improved balance (05/09/16)   Status On-going           PT Long Term Goals - 04/25/16 1454      PT LONG TERM GOAL #1   Title Patient to be independent with advanced HEP for balance and strengthening (06/06/16)   Status On-going     PT LONG TERM GOAL #2   Title Patient to improve Berg Balance to 49/56 demonstrating reduced fall risk (06/06/16)   Status On-going     PT LONG TERM GOAL #3   Title Patient to improve gait velocity to >2.62 feet/second (06/06/16)   Status On-going     PT LONG TERM GOAL #4   Title Patient to demonstrate ability to ambulate >500 feet on a variety of surfaces without use of AD with no evidence of instability or LOB demonstrating improved functional mobility (06/06/16)   Status On-going     PT LONG TERM GOAL #5   Title Patient to improve R hip strength to equal that of L hip with no increase in pain (06/06/16)   Status On-going     PT LONG TERM GOAL #6   Title Patient to report ability to  return to gardening with no LOB (06/06/16)   Status On-going               Plan - 05/05/16 1554    Clinical Impression Statement Good work today with balance activiites - R SL stance with L LE resting on pebble with no issue and only CGA/SUP, however L SL stance with R LE on pebble much more difficult with patient tending to heavily retropulse. Patient often times becoming frustrated, however easily assured that she was making good progress as seen with no sway or LOB with tapping beach ball. Will contiue to progress balance and general hip strength.    PT Treatment/Interventions ADLs/Self Care Home Management;Cryotherapy;Electrical Stimulation;Moist Heat;Ultrasound;Neuromuscular re-education;Balance training;Therapeutic exercise;Therapeutic activities;Functional mobility training;Stair training;Gait training;DME Instruction;Patient/family education;Manual techniques;Scar mobilization;Passive range of motion;Vasopneumatic Device;Taping;Dry needling   Consulted and Agree with Plan of Care Patient      Patient will benefit from skilled therapeutic intervention in order to improve the following deficits and impairments:  Abnormal gait, Decreased activity tolerance, Decreased balance, Decreased safety awareness, Decreased mobility, Decreased strength, Difficulty walking, Increased muscle spasms, Pain  Visit Diagnosis: Pain in right hip  Difficulty in walking, not elsewhere classified  Unsteadiness on feet  Other abnormalities of gait and mobility  Muscle weakness (generalized)     Problem List Patient Active Problem List   Diagnosis Date Noted  . Diabetes mellitus with hyperglycemia, with long-term current use of insulin (HCC) 01/29/2016  . Leukocytosis 01/25/2016  . Closed fracture of right hip (HCC)   . Diabetes mellitus with complication (HCC)   . Fall   . Left knee pain 05/28/2015  . Sprain of hand, third finger, right 05/28/2015  . HTN (hypertension) 07/29/2014  . Gait  instability 07/29/2014  . Hyperlipidemia 07/29/2014  . Right knee pain 04/11/2014  . ASNHL (asymmetrical sensorineural hearing loss) 08/07/2013  . 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 Hernia, rectovaginal 08/07/2013  . Hypercholesterolemia without hypertriglyceridemia 08/07/2013  . Arthritis of knee, degenerative 08/07/2013  . Degenerative arthritis of lumbar spine 08/07/2013  . Osteopenia 08/07/2013  . Adult hypothyroidism 08/07/2013  . History of artificial joint 08/07/2013  . Essential (primary) hypertension 08/07/2013  . Depression with anxiety 08/07/2013    Kipp LaurenceStephanie R Aaron, PT, DPT  05/05/16 3:57 PM   [facility] [address]  [address] [place], [place], [address] Phone: [phone]   Fax:  [phone]  Name: Janellie Tennison MRN: [id number] Date of Birth: 1935-06-02

## 2016-05-09 ENCOUNTER — Ambulatory Visit: Payer: Medicare Other | Admitting: Physical Therapy

## 2016-05-09 ENCOUNTER — Ambulatory Visit (INDEPENDENT_AMBULATORY_CARE_PROVIDER_SITE_OTHER): Payer: Medicare Other | Admitting: Internal Medicine

## 2016-05-09 ENCOUNTER — Encounter: Payer: Self-pay | Admitting: Internal Medicine

## 2016-05-09 VITALS — BP 118/78 | HR 69 | Ht 65.0 in | Wt 143.0 lb

## 2016-05-09 DIAGNOSIS — E1122 Type 2 diabetes mellitus with diabetic chronic kidney disease: Secondary | ICD-10-CM

## 2016-05-09 DIAGNOSIS — Z794 Long term (current) use of insulin: Secondary | ICD-10-CM | POA: Diagnosis not present

## 2016-05-09 DIAGNOSIS — R2689 Other abnormalities of gait and mobility: Secondary | ICD-10-CM

## 2016-05-09 DIAGNOSIS — R2681 Unsteadiness on feet: Secondary | ICD-10-CM | POA: Diagnosis not present

## 2016-05-09 DIAGNOSIS — M6281 Muscle weakness (generalized): Secondary | ICD-10-CM

## 2016-05-09 DIAGNOSIS — R262 Difficulty in walking, not elsewhere classified: Secondary | ICD-10-CM | POA: Diagnosis not present

## 2016-05-09 DIAGNOSIS — M25551 Pain in right hip: Secondary | ICD-10-CM | POA: Diagnosis not present

## 2016-05-09 DIAGNOSIS — N183 Chronic kidney disease, stage 3 (moderate): Secondary | ICD-10-CM

## 2016-05-09 LAB — POCT GLYCOSYLATED HEMOGLOBIN (HGB A1C): HEMOGLOBIN A1C: 8.6

## 2016-05-09 MED ORDER — LINAGLIPTIN 5 MG PO TABS: 5.0000 mg | ORAL_TABLET | Freq: Every day | ORAL | 11 refills | Status: DC

## 2016-05-09 NOTE — Addendum Note (Signed)
Addended by: Darene LamerHOMPSON, Zachary Nole T on: 05/09/2016 09:31 AM   Modules accepted: Orders

## 2016-05-09 NOTE — Therapy (Signed)
[facility] [address]  [address] [place], [place], [address] Phone: [phone]   Fax:  [phone]  Physical Therapy Treatment  Patient Details  Name: Jessica Meyer MRN: [id number] Date of Birth: October 21, 1935 Referring Provider: Dr. Margarita Rana  Encounter Date: 05/09/2016      PT End of Session - 05/09/16 1316    Visit Number 8   Number of Visits 16   Date for PT Re-Evaluation 06/06/16   Authorization Type Medicare   PT Start Time 1312   PT Stop Time 1359   PT Time Calculation (min) 47 min   Activity Tolerance Patient tolerated treatment well   Behavior During Therapy [facility] for tasks assessed/performed      Past Medical History:  Diagnosis Date  . Arthritis   . Diabetes mellitus without complication (HCC)   . History of chicken pox   . Hyperlipidemia   . Hypertension   . Thyroid disease    hypothyroid    Past Surgical History:  Procedure Laterality Date  . ABDOMINAL HYSTERECTOMY    . HIP ARTHROPLASTY Right 01/26/2016   Procedure: ARTHROPLASTY BIPOLAR HIP (HEMIARTHROPLASTY);  Surgeon: Sheral Apley, MD;  Location: [facility] OR;  Service: Orthopedics;  Laterality: Right;  . KNEE ARTHROSCOPY Bilateral   . REPLACEMENT TOTAL KNEE BILATERAL     1998 left , 1992 right    There were no vitals filed for this visit.      Subjective Assessment - 05/09/16 1315    Subjective feeling well - says legs are tired from exercise and being in garden   Patient is accompained by: Family member   Pertinent History HTN, DM   Patient Stated Goals improve pain and balance, return to gardening   Currently in Pain? No/denies   Pain Score 0-No pain                         [facility] Adult PT Treatment/Exercise - 05/09/16 1317      Neuro Re-ed    Neuro Re-ed Details  red mat double folded with pebbles in between: patient walking circumference of mat, figure 8's, side stepping; ambulat in hallway with horizontal and  vertical head turns, as well as cognitive tasks; tandem walking      Knee/Hip Exercises: Aerobic   Nustep L6 x 6 minutes      Knee/Hip Exercises: Standing   Hip Flexion Stengthening;Right;Left;15 reps;Knee straight   Hip Flexion Limitations green tband - single UE support from PT   Hip ADduction Strengthening;Right;Left;15 reps   Hip ADduction Limitations green tband - single UE support from PT   Hip Abduction Stengthening;Right;Left;15 reps;Knee straight   Abduction Limitations green tband - single UE support from PT   Hip Extension Stengthening;Right;Left;15 reps;Knee straight   Extension Limitations green tband - single UE support from PT   Forward Step Up Limitations step up and over 6" step with 1 AirEx mat - CGA for safety   SLS SL stance with 1 foot on pebble - 3 x 30-45 seconds each - more difficulty with L SL stance; tandem stance on firm surface each LE 2 x 30 seconds                  PT Short Term Goals - 04/25/16 1454      PT SHORT TERM GOAL #1   Title patient to be independent with initial HEP (05/09/16)   Status On-going     PT  SHORT TERM GOAL #2   Title Patient to improve Berg Balance score to 40/56 demonstrating improved balance (05/09/16)   Status On-going           PT Long Term Goals - 04/25/16 1454      PT LONG TERM GOAL #1   Title Patient to be independent with advanced HEP for balance and strengthening (06/06/16)   Status On-going     PT LONG TERM GOAL #2   Title Patient to improve Berg Balance to 49/56 demonstrating reduced fall risk (06/06/16)   Status On-going     PT LONG TERM GOAL #3   Title Patient to improve gait velocity to >2.62 feet/second (06/06/16)   Status On-going     PT LONG TERM GOAL #4   Title Patient to demonstrate ability to ambulate >500 feet on a variety of surfaces without use of AD with no evidence of instability or LOB demonstrating improved functional mobility (06/06/16)   Status On-going     PT LONG TERM GOAL #5   Title  Patient to improve R hip strength to equal that of L hip with no increase in pain (06/06/16)   Status On-going     PT LONG TERM GOAL #6   Title Patient to report ability to return to gardening with no LOB (06/06/16)   Status On-going               Plan - 05/09/16 1316    Clinical Impression Statement Good carryover of SL balance from last visit, as well as much improved SL standing strengthening with patient only requiring 1 UE support and demonstrating much more stable BOS today. Today aptient having most difficulty with gait with horizontal head turns with tendency to slow gait or scissor walk increasing fall risk - will continue to work on this.    PT Treatment/Interventions ADLs/Self Care Home Management;Cryotherapy;Electrical Stimulation;Moist Heat;Ultrasound;Neuromuscular re-education;Balance training;Therapeutic exercise;Therapeutic activities;Functional mobility training;Stair training;Gait training;DME Instruction;Patient/family education;Manual techniques;Scar mobilization;Passive range of motion;Vasopneumatic Device;Taping;Dry needling   Consulted and Agree with Plan of Care Patient      Patient will benefit from skilled therapeutic intervention in order to improve the following deficits and impairments:  Abnormal gait, Decreased activity tolerance, Decreased balance, Decreased safety awareness, Decreased mobility, Decreased strength, Difficulty walking, Increased muscle spasms, Pain  Visit Diagnosis: Pain in right hip  Difficulty in walking, not elsewhere classified  Unsteadiness on feet  Other abnormalities of gait and mobility  Muscle weakness (generalized)     Problem List Patient Active Problem List   Diagnosis Date Noted  . Type 2 diabetes mellitus with stage 3 chronic kidney disease, with long-term current use of insulin (HCC) 01/29/2016  . Leukocytosis 01/25/2016  . Closed fracture of right hip (HCC)   . Diabetes mellitus with complication (HCC)   . Fall    . Left knee pain 05/28/2015  . Sprain of hand, third finger, right 05/28/2015  . HTN (hypertension) 07/29/2014  . Gait instability 07/29/2014  . Hyperlipidemia 07/29/2014  . Right knee pain 04/11/2014  . ASNHL (asymmetrical sensorineural hearing loss) 08/07/2013  . 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 Hernia, rectovaginal 08/07/2013  . Hypercholesterolemia without hypertriglyceridemia 08/07/2013  . Arthritis of knee, degenerative 08/07/2013  . Degenerative arthritis of lumbar spine 08/07/2013  . Osteopenia 08/07/2013  . Adult hypothyroidism 08/07/2013  . History of artificial joint 08/07/2013  . Essential (primary) hypertension 08/07/2013  . Depression with anxiety 08/07/2013     Kipp LaurenceStephanie R Kenika Sahm, PT, DPT 05/09/16 5:07 PM  [facility] [address]  [address] [place], [place], [address] Phone: [phone]   Fax:  [phone]  Name: Jessica Meyer MRN: [id number] Date of Birth: Oct 04, 1935

## 2016-05-09 NOTE — Progress Notes (Signed)
Patient ID: [name], female   DOB: [birth year], 81 y.o.   MRN: [id number]   HPI: Belanna Manring is a 81 y.o.-year-old female, referred by her PCP, Debbrah Alar, NP, for management of DM2, dx in 61s, insulin-dependent since ~2012, uncontrolled, with complications (CKD stage 3-4).  Last hemoglobin A1c was: Lab Results  Component Value Date   HGBA1C 8.6 (H) 01/25/2016   HGBA1C 8.7 (H) 01/15/2016   HGBA1C 7.5 (H) 09/14/2015   Pt is on a regimen of: - Lantus 14 units at bedtime  Pt checks her sugars 2x a day and they are: - am: 117-140 - 2h after b'fast: n/c - before lunch: n/c - 2h after lunch: n/c - before dinner: n/c - 2h after dinner: n/c - bedtime: 180-217 - nighttime: n/c + 1 low: 27 x1 in 01/2016, more recently 114; she has hypoglycemia awareness at 90s.  Highest sugar was 200s.  Glucometer: Accu-Chek Aviva  Pt's meals are: - Breakfast: eggs + sausage and bacon - Lunch: can of soup + [place] sandwich - Dinner: canned green beans, baked potatoes, pork chops or [place] sandwich - Snacks: sandwich at bedtime (!)  - + CKD stage 3-4, last BUN/creatinine:  Lab Results  Component Value Date   BUN 17 01/26/2016   BUN 21 (H) 01/25/2016   CREATININE 1.30 (H) 01/26/2016   CREATININE 1.34 (H) 01/25/2016  On Lisinopril 40 mg daily. - last set of lipids: Lab Results  Component Value Date   CHOL 150 05/11/2015   HDL 43.80 05/11/2015   LDLCALC 71 05/11/2015   TRIG 174.0 (H) 05/11/2015   CHOLHDL 3 05/11/2015  On Atorvastatin 10 mg daily. - last eye exam was in 03/07/2016. No DR.  - no numbness and tingling in her feet.  No FH of DM.  She also has a history of hypothyroidism, only levothyroxine 62.5 g daily, managed by PCP. Last TSH was normal: Lab Results  Component Value Date   TSH 3.80 01/15/2016   She also has HTN, GERD. ROS: Constitutional: no weight gain/loss, no fatigue, no subjective hyperthermia/hypothermia Eyes: no blurry vision, no  xerophthalmia ENT: no sore throat, no nodules palpated in throat, no dysphagia/odynophagia, no hoarseness Cardiovascular: no CP/SOB/palpitations/leg swelling Respiratory: no cough/SOB Gastrointestinal: no N/V/D/C Musculoskeletal: + muscle pain (in PT)/no joint aches Skin: no rashes, + easy bruising (ASA) Neurological: no tremors/numbness/tingling/dizziness Psychiatric: + both: depression/anxiety  Past Medical History:  Diagnosis Date  . Arthritis   . Diabetes mellitus without complication ([place])   . History of chicken pox   . Hyperlipidemia   . Hypertension   . Thyroid disease    hypothyroid   Past Surgical History:  Procedure Laterality Date  . ABDOMINAL HYSTERECTOMY    . HIP ARTHROPLASTY Right 01/26/2016   Procedure: ARTHROPLASTY BIPOLAR HIP (HEMIARTHROPLASTY);  Surgeon: Renette Butters, MD;  Location: [facility];  Service: Orthopedics;  Laterality: Right;  . KNEE ARTHROSCOPY Bilateral   . REPLACEMENT TOTAL KNEE BILATERAL     1998 left , 1992 right   Social History   Social History Main Topics  . Smoking status: Never Smoker  . Smokeless tobacco: Current User    Types: Snuff  . Alcohol use 0.6 oz/week    1 Standard drinks or equivalent per week     Comment: occasional beer, wine  . Drug use: No   Social History Narrative   Retired,  homemaker   Widow   Completed 7th grade   2 daughter   Son and daughter. Daughter local,  son in [place]   4 grandchildren   Enjoys puzzles, gardening.      Current Outpatient Prescriptions on File Prior to Visit  Medication Sig Dispense Refill  . aspirin EC 81 MG tablet Take 81 mg by mouth daily.    Marland Kitchen atorvastatin (LIPITOR) 10 MG tablet TAKE ONE TABLET BY MOUTH AT BEDTIME 90 tablet 1  . B-D UF III MINI PEN NEEDLES 31G X 5 MM MISC USE DAILY WITH LANTUS SOLOSTAR PENS 100 each 2  . Blood Glucose Monitoring Suppl (ACCU-CHEK AVIVA PLUS) w/Device KIT Use to check blood sugar once a day.  DX E11.9 1 kit 0  . buPROPion (WELLBUTRIN) 100 MG tablet  TAKE ONE TABLET BY MOUTH TWO TIMES A DAY 60 tablet 2  . carvedilol (COREG) 12.5 MG tablet TAKE 1 TABLET BY MOUTH TWO TIMES A DAY WITH MEAL 180 tablet 0  . glucose blood (ACCU-CHEK AVIVA PLUS) test strip Use as instructed to check blood sugar twice a day.  DX E11.9 200 each 1  . hydrOXYzine (ATARAX/VISTARIL) 25 MG tablet Take 1 tablet (25 mg total) by mouth 2 (two) times daily as needed. 60 tablet 5  . Lancet Devices (ACCU-CHEK SOFTCLIX) lancets Use to check blood sugar twice a day.  DX E11.9 100 each 5  . LANTUS SOLOSTAR 100 UNIT/ML Solostar Pen INJECT 14 UNITS INTO THE SKIN BEFORE LUNCH 15 mL 1  . levothyroxine (SYNTHROID, LEVOTHROID) 125 MCG tablet Take 0.5 tablets (62.5 mcg total) by mouth daily before breakfast. 45 tablet 1  . lisinopril (PRINIVIL,ZESTRIL) 40 MG tablet TAKE ONE TABLET BY MOUTH DAILY 90 tablet 1  . amLODipine (NORVASC) 5 MG tablet TAKE ONE TABLET BY MOUTH DAILY (Patient not taking: Reported on 05/09/2016) 90 tablet 1  . rivaroxaban (XARELTO) 10 MG TABS tablet Take 1 tablet (10 mg total) by mouth daily. (Patient not taking: Reported on 05/09/2016) 21 tablet 0   No current facility-administered medications on file prior to visit.    No Known Allergies Family History  Problem Relation Age of Onset  . Arthritis Mother   . Depression Mother   . Heart attack Father     96   . Hypertension Father     PE: BP 118/78 (BP Location: Left Arm, Patient Position: Sitting)   Pulse 69   Ht _0  (1.651 m)   Wt 143 lb (64.9 kg)   SpO2 97%   BMI 23.80 kg/m  Wt Readings from Last 3 Encounters:  05/09/16 143 lb (64.9 kg)  03/07/16 143 lb (64.9 kg)  01/25/16 146 lb (66.2 kg)   Constitutional: normal weight, in NAD Eyes: PERRLA, EOMI, no exophthalmos ENT: moist mucous membranes, no thyromegaly, no cervical lymphadenopathy Cardiovascular: RRR, No MRG Respiratory: CTA B Gastrointestinal: abdomen soft, NT, ND, BS+ Musculoskeletal: no deformities, strength intact in all 4 Skin: moist,  warm, + few ecchimoses arms Neurological: no tremor with outstretched hands, DTR normal in all 4  ASSESSMENT: 1. DM2, insulin-dependent, uncontrolled, with complications - CKD stage 3-4  PLAN:  1. Patient with long-standing, uncontrolled diabetes, on long acting insulin only, which became insufficient. She had an episode of hypoglycemia in the a.m., was she was taking Lantus at night. She was previously taking it at lunchtime, which worked better for her, she tells me. At this visit, we discussed that if the sugars are increasing as the day goes by and then drop during the night, she will need mealtime coverage and we can either reduce the dose of Lantus or move  it in the morning. For now, since her A1c today returned high, at 8.6% (stable), we'll just move the Lantus in a.m., but keep the dose stable. We will try to add Tardjenta, to cover her meals, since this is not renally adjusted. If this is not covered by her insurance, we may need to use Januvia 25 or 50 mg daily.  - I suggested to:  Patient Instructions  Please continue: - Lantus 14 units but move it to am (today take it at lunchtime)  Please add: - Tradjenta 5 mg daily before b'fast  Please let me know if the sugars are consistently <80 or >200.  Please come back for a follow-up appointment in 3 months with your sugar log.  - Strongly advised her to start checking sugars at different times of the day - check 1-2 times a day, rotating checks - given sugar log and advised how to fill it and to bring it at next appt  - given foot care handout and explained the principles  - given instructions for hypoglycemia management "15-15 rule"  - advised for yearly eye exams >> she is UTD - Return to clinic in 3 mo with sugar log   Philemon Kingdom, MD PhD [facility]

## 2016-05-09 NOTE — Patient Instructions (Addendum)
Please continue: - Lantus 14 units but move it to am (today take it at lunchtime)  Please add: - Tradjenta 5 mg daily before b'fast  Please let me know if the sugars are consistently <80 or >200.  Please come back for a follow-up appointment in 3 months with your sugar log.  PATIENT INSTRUCTIONS FOR TYPE 2 DIABETES:  DIET AND EXERCISE Diet and exercise is an important part of diabetic treatment.  We recommended aerobic exercise in the form of brisk walking (working between 40-60% of maximal aerobic capacity, similar to brisk walking) for 150 minutes per week (such as 30 minutes five days per week) along with 3 times per week performing 'resistance' training (using various gauge rubber tubes with handles) 5-10 exercises involving the major muscle groups (upper body, lower body and core) performing 10-15 repetitions (or near fatigue) each exercise. Start at half the above goal but build slowly to reach the above goals. If limited by weight, joint pain, or disability, we recommend daily walking in a swimming pool with water up to waist to reduce pressure from joints while allow for adequate exercise.    BLOOD GLUCOSES Monitoring your blood glucoses is important for continued management of your diabetes. Please check your blood glucoses 2-4 times a day: fasting, before meals and at bedtime (you can rotate these measurements - e.g. one day check before the 3 meals, the next day check before 2 of the meals and before bedtime, etc.).   HYPOGLYCEMIA (low blood sugar) Hypoglycemia is usually a reaction to not eating, exercising, or taking too much insulin/ other diabetes drugs.  Symptoms include tremors, sweating, hunger, confusion, headache, etc. Treat IMMEDIATELY with 15 grams of Carbs: . 4 glucose tablets .  cup regular juice/soda . 2 tablespoons raisins . 4 teaspoons sugar . 1 tablespoon honey Recheck blood glucose in 15 mins and repeat above if still symptomatic/blood glucose  <100.  RECOMMENDATIONS TO REDUCE YOUR RISK OF DIABETIC COMPLICATIONS: * Take your prescribed MEDICATION(S) * Follow a DIABETIC diet: Complex carbs, fiber rich foods, (monounsaturated and polyunsaturated) fats * AVOID saturated/trans fats, high fat foods, >2,300 mg salt per day. * EXERCISE at least 5 times a week for 30 minutes or preferably daily.  * DO NOT SMOKE OR DRINK more than 1 drink a day. * Check your FEET every day. Do not wear tightfitting shoes. Contact us if you develop an ulcer * See your EYE doctor once a year or more if needed * Get a FLU shot once a year * Get a PNEUMONIA vaccine once before and once after age 79 years  GOALS:  * Your Hemoglobin A1c of <7%  * fasting sugars need to be <130 * after meals sugars need to be <180 (2h after you start eating) * Your Systolic BP should be 140 or lower  * Your Diastolic BP should be 80 or lower  * Your HDL (Good Cholesterol) should be 40 or higher  * Your LDL (Bad Cholesterol) should be 100 or lower. * Your Triglycerides should be 150 or lower  * Your Urine microalbumin (kidney function) should be <30 * Your Body Mass Index should be 25 or lower    Please consider the following ways to cut down carbs and fat and increase fiber and micronutrients in your diet: - substitute whole grain for white bread or pasta - substitute brown rice for white rice - substitute 90-calorie flat bread pieces for slices of bread when possible - substitute sweet potatoes or yams for  white potatoes - substitute humus for margarine - substitute tofu for cheese when possible - substitute almond or rice milk for regular milk (would not drink soy milk daily due to concern for soy estrogen influence on breast cancer risk) - substitute dark chocolate for other sweets when possible - substitute water - can add lemon or orange slices for taste - for diet sodas (artificial sweeteners will trick your body that you can eat sweets without getting calories and  will lead you to overeating and weight gain in the long run) - do not skip breakfast or other meals (this will slow down the metabolism and will result in more weight gain over time)  - can try smoothies made from fruit and almond/rice milk in am instead of regular breakfast - can also try old-fashioned (not instant) oatmeal made with almond/rice milk in am - order the dressing on the side when eating salad at a restaurant (pour less than half of the dressing on the salad) - eat as little meat as possible - can try juicing, but should not forget that juicing will get rid of the fiber, so would alternate with eating raw veg./fruits or drinking smoothies - use as little oil as possible, even when using olive oil - can dress a salad with a mix of balsamic vinegar and lemon juice, for e.g. - use agave nectar, stevia sugar, or regular sugar rather than artificial sweateners - steam or broil/roast veggies  - snack on veggies/fruit/nuts (unsalted, preferably) when possible, rather than processed foods - reduce or eliminate aspartame in diet (it is in diet sodas, chewing gum, etc) Read the labels!  Try to read Dr. Katherina Right book: "Program for Reversing Diabetes" for other ideas for healthy eating.

## 2016-05-11 ENCOUNTER — Ambulatory Visit: Payer: Medicare Other | Admitting: Physical Therapy

## 2016-05-11 DIAGNOSIS — M25551 Pain in right hip: Secondary | ICD-10-CM | POA: Diagnosis not present

## 2016-05-11 DIAGNOSIS — R2681 Unsteadiness on feet: Secondary | ICD-10-CM

## 2016-05-11 DIAGNOSIS — R262 Difficulty in walking, not elsewhere classified: Secondary | ICD-10-CM

## 2016-05-11 DIAGNOSIS — M6281 Muscle weakness (generalized): Secondary | ICD-10-CM | POA: Diagnosis not present

## 2016-05-11 DIAGNOSIS — R2689 Other abnormalities of gait and mobility: Secondary | ICD-10-CM | POA: Diagnosis not present

## 2016-05-11 NOTE — Therapy (Signed)
[facility] [place] [address]  [address] [place], [place], [address] Phone: [phone]   Fax:  [phone]  Physical Therapy Treatment  Patient Details  Name: Jessica SaxonDoris Ann Perillo MRN: [id number] Date of Birth: 05/09/1935 Referring Provider: Dr. Margarita Ranaimothy Murphy  Encounter Date: 05/11/2016      PT End of Session - 05/11/16 1559    Visit Number 9   Number of Visits 16   Date for PT Re-Evaluation 06/06/16   Authorization Type Medicare   PT Start Time 1315   PT Stop Time 1404   PT Time Calculation (min) 49 min   Activity Tolerance Patient tolerated treatment well   Behavior During Therapy [facility] for tasks assessed/performed      Past Medical History:  Diagnosis Date  . Arthritis   . Diabetes mellitus without complication ([facility])   . History of chicken pox   . Hyperlipidemia   . Hypertension   . Thyroid disease    hypothyroid    Past Surgical History:  Procedure Laterality Date  . ABDOMINAL HYSTERECTOMY    . HIP ARTHROPLASTY Right 01/26/2016   Procedure: ARTHROPLASTY BIPOLAR HIP (HEMIARTHROPLASTY);  Surgeon: Sheral Apleyimothy D Murphy, MD;  [facility];  Service: Orthopedics;  Laterality: Right;  . KNEE ARTHROSCOPY Bilateral   . REPLACEMENT TOTAL KNEE BILATERAL     1998 left , 1992 right    There were no vitals filed for this visit.      Subjective Assessment - 05/11/16 1555    Subjective doing well - has been out in her garden; son in law states taht she has not been using stool, but rather stooping over   Patient is accompained by: Family member   Pertinent History HTN, DM   Patient Stated Goals improve pain and balance, return to gardening   Currently in Pain? No/denies   Pain Score 0-No pain                         [facility] Adult PT Treatment/Exercise - 05/11/16 0001      Neuro Re-ed    Neuro Re-ed Details  walking with horizontal head turns x 400 feat, vertical head turns 200 feet, cognitive tasks 200 feet;  obstacle course of stepping over 9" stool with AirEx, over AirEx, around cones, over 9' stoll, circle mat, and then side stepping over 3 bolsters x 6;     Knee/Hip Exercises: Aerobic   Nustep L6 x 6 minutes      Knee/Hip Exercises: Seated   Long Arc Quad Strengthening;Right;Left;15 reps   Long Arc Quad Weight 4 lbs.   Hamstring Curl Strengthening;Right;Left;15 reps   Hamstring Limitations blue tband   Sit to Sand 20 reps  intermittent UE support from low box                  PT Short Term Goals - 04/25/16 1454      PT SHORT TERM GOAL #1   Title patient to be independent with initial HEP (05/09/16)   Status On-going     PT SHORT TERM GOAL #2   Title Patient to improve Berg Balance score to 40/56 demonstrating improved balance (05/09/16)   Status On-going           PT Long Term Goals - 04/25/16 1454      PT LONG TERM GOAL #1   Title Patient to be independent with advanced HEP for balance and strengthening (06/06/16)   Status On-going  PT LONG TERM GOAL #2   Title Patient to improve Berg Balance to 49/56 demonstrating reduced fall risk (06/06/16)   Status On-going     PT LONG TERM GOAL #3   Title Patient to improve gait velocity to >2.62 feet/second (06/06/16)   Status On-going     PT LONG TERM GOAL #4   Title Patient to demonstrate ability to ambulate >500 feet on a variety of surfaces without use of AD with no evidence of instability or LOB demonstrating improved functional mobility (06/06/16)   Status On-going     PT LONG TERM GOAL #5   Title Patient to improve R hip strength to equal that of L hip with no increase in pain (06/06/16)   Status On-going     PT LONG TERM GOAL #6   Title Patient to report ability to return to gardening with no LOB (06/06/16)   Status On-going               Plan - 05/11/16 1559    Clinical Impression Statement Improved wlaking with head turns today - much more stable and able to increase speed. Patient walking through  obstacle course with some difficulty as patient has difficulty remembering lengthy directions as well as stepping on compliant surfaces - however, able ot improve with each attempt. Patient doing well with progression towards all goals.    PT Treatment/Interventions ADLs/Self Care Home Management;Cryotherapy;Electrical Stimulation;Moist Heat;Ultrasound;Neuromuscular re-education;Balance training;Therapeutic exercise;Therapeutic activities;Functional mobility training;Stair training;Gait training;DME Instruction;Patient/family education;Manual techniques;Scar mobilization;Passive range of motion;Vasopneumatic Device;Taping;Dry needling   Consulted and Agree with Plan of Care Patient      Patient will benefit from skilled therapeutic intervention in order to improve the following deficits and impairments:  Abnormal gait, Decreased activity tolerance, Decreased balance, Decreased safety awareness, Decreased mobility, Decreased strength, Difficulty walking, Increased muscle spasms, Pain  Visit Diagnosis: Pain in right hip  Difficulty in walking, not elsewhere classified  Unsteadiness on feet  Other abnormalities of gait and mobility  Muscle weakness (generalized)     Problem List Patient Active Problem List   Diagnosis Date Noted  . Type 2 diabetes mellitus with stage 3 chronic kidney disease, with long-term current use of insulin (HCC) 01/29/2016  . Leukocytosis 01/25/2016  . Closed fracture of right hip (HCC)   . Diabetes mellitus with complication ([facility])   . Fall   . Left knee pain 05/28/2015  . Sprain of hand, third finger, right 05/28/2015  . HTN (hypertension) 07/29/2014  . Gait instability 07/29/2014  . Hyperlipidemia 07/29/2014  . Right knee pain 04/11/2014  . ASNHL (asymmetrical sensorineural hearing loss) 08/07/2013  . 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 Hernia, rectovaginal 08/07/2013  . Hypercholesterolemia without hypertriglyceridemia 08/07/2013  . Arthritis of knee,  degenerative 08/07/2013  . Degenerative arthritis of lumbar spine 08/07/2013  . Osteopenia 08/07/2013  . Adult hypothyroidism 08/07/2013  . History of artificial joint 08/07/2013  . Essential (primary) hypertension 08/07/2013  . Depression with anxiety 08/07/2013     Kipp Laurence, PT, DPT 05/11/16 4:01 PM   [facility] [address]  [address] [place], [place], [address] Phone: [phone]   Fax:  [phone]  Name: Babygirl Trager MRN: [id number] Date of Birth: Feb 19, 1935

## 2016-05-17 ENCOUNTER — Ambulatory Visit: Payer: Medicare Other | Admitting: Physical Therapy

## 2016-05-17 DIAGNOSIS — R262 Difficulty in walking, not elsewhere classified: Secondary | ICD-10-CM | POA: Diagnosis not present

## 2016-05-17 DIAGNOSIS — M6281 Muscle weakness (generalized): Secondary | ICD-10-CM

## 2016-05-17 DIAGNOSIS — R2681 Unsteadiness on feet: Secondary | ICD-10-CM

## 2016-05-17 DIAGNOSIS — R2689 Other abnormalities of gait and mobility: Secondary | ICD-10-CM | POA: Diagnosis not present

## 2016-05-17 DIAGNOSIS — M25551 Pain in right hip: Secondary | ICD-10-CM

## 2016-05-17 NOTE — Patient Instructions (Signed)
Single knee to chest    Bring 1 knee to chest at a time - hold 15-30 seconds, 3-5 times each leg   Open Book    Lying on one side - open and twist, hold 5 seconds; repeat 10 times each side   Axial Extension (Chin Tuck)    Pull chin in and lengthen back of neck. Hold __5__ seconds while counting out loud. Repeat _15__ times. Do __3__ sessions per day.    Scapular Retraction (Standing)    With arms at sides, pinch shoulder blades together. Repeat _15___ times per set. Do __3__ sets per session.    Seated Hamstring stretch    One leg out straight - bend at hips - 30 seconds x 3 times each leg   Lower lumbar rotation    With knees bent - let legs fall back and forth - SLOW

## 2016-05-17 NOTE — Therapy (Signed)
[facility] [place] [address]  [address] [place], [place], [address] Phone: [phone]   Fax:  [phone]  Physical Therapy Treatment  Patient Details  Name: Jessica SaxonDoris Ann Hopkin MRN: [id number] Date of Birth: 06/26/1935 Referring Provider: Dr. Margarita Ranaimothy Murphy  Encounter Date: 05/17/2016      PT End of Session - 05/17/16 1331    Visit Number 10   Number of Visits 16   Date for PT Re-Evaluation 06/06/16   Authorization Type Medicare   PT Start Time 1327   PT Stop Time 1410   PT Time Calculation (min) 43 min   Activity Tolerance Patient tolerated treatment well   Behavior [facility] for tasks assessed/performed      Past Medical History:  Diagnosis Date  . Arthritis   . Diabetes mellitus without complication (HCC)   . History of chicken pox   . Hyperlipidemia   . Hypertension   . Thyroid disease    hypothyroid    Past Surgical History:  Procedure Laterality Date  . ABDOMINAL HYSTERECTOMY    . HIP ARTHROPLASTY Right 01/26/2016   Procedure: ARTHROPLASTY BIPOLAR HIP (HEMIARTHROPLASTY);  Surgeon: Sheral Apleyimothy D Murphy, MD;  [facility];  Service: Orthopedics;  Laterality: Right;  . KNEE ARTHROSCOPY Bilateral   . REPLACEMENT TOTAL KNEE BILATERAL     1998 left , 1992 right    There were no vitals filed for this visit.      Subjective Assessment - 05/17/16 1329    Subjective Is having some back pain today - started on Sunday - no known reason.    Patient is accompained by: Family member   Pertinent History HTN, DM   Patient Stated Goals improve pain and balance, return to gardening   Currently in Pain? Yes   Pain Score 7    Pain Location Back   Pain Orientation Lower   Pain Descriptors / Indicators Aching;Discomfort   Pain Type Acute pain                         [facility] Adult PT Treatment/Exercise - 05/17/16 1332      Exercises   Exercises Lumbar     Lumbar Exercises: Stretches   Passive  Hamstring Stretch 3 reps;30 seconds   Passive Hamstring Stretch Limitations bilateral; seated with LE extended   Single Knee to Chest Stretch 3 reps;30 seconds   Single Knee to Chest Stretch Limitations bilateral; supine   Lower Trunk Rotation Limitations 10 reps; 10 seconds     Lumbar Exercises: Seated   Other Seated Lumbar Exercises scap squeeze - 15 x 5 sec each - heavy VC   Other Seated Lumbar Exercises chin tuck x 10 reps - heavy VC and TC Attempted childs pose - unable due to knee pain     Lumbar Exercises: Sidelying   Other Sidelying Lumbar Exercises open book - 10 x 5 sec each side     Lumbar Exercises: Quadruped   [place] Limitations attempted - difficulty with direction and with weight on B wrist     Knee/Hip Exercises: Aerobic   Nustep L5 x 6 minutes                  PT Short Term Goals - 04/25/16 1454      PT SHORT TERM GOAL #1   Title patient to be independent with initial HEP (05/09/16)   Status On-going     PT SHORT TERM  GOAL #2   Title Patient to improve Berg Balance score to 40/56 demonstrating improved balance (05/09/16)   Status On-going           PT Long Term Goals - 04/25/16 1454      PT LONG TERM GOAL #1   Title Patient to be independent with advanced HEP for balance and strengthening (06/06/16)   Status On-going     PT LONG TERM GOAL #2   Title Patient to improve Berg Balance to 49/56 demonstrating reduced fall risk (06/06/16)   Status On-going     PT LONG TERM GOAL #3   Title Patient to improve gait velocity to >2.62 feet/second (06/06/16)   Status On-going     PT LONG TERM GOAL #4   Title Patient to demonstrate ability to ambulate >500 feet on a variety of surfaces without use of AD with no evidence of instability or LOB demonstrating improved functional mobility (06/06/16)   Status On-going     PT LONG TERM GOAL #5   Title Patient to improve R hip strength to equal that of L hip with no increase in pain (06/06/16)   Status  On-going     PT LONG TERM GOAL #6   Title Patient to report ability to return to gardening with no LOB (06/06/16)   Status On-going               Plan - June 11, 2016 1331    Clinical Impression Statement Patient today with complaints of low back pain of unknown origin. Patient and family member requesting PT review stretching and mobility techniques to manage and prevent pain. Patient doing well with all tasks given, however did require VC and TC for correct form and for good control with pain able to be slightly resolved by end of session.    PT Treatment/Interventions ADLs/Self Care Home Management;Cryotherapy;Electrical Stimulation;Moist Heat;Ultrasound;Neuromuscular re-education;Balance training;Therapeutic exercise;Therapeutic activities;Functional mobility training;Stair training;Gait training;DME Instruction;Patient/family education;Manual techniques;Scar mobilization;Passive range of motion;Vasopneumatic Device;Taping;Dry needling   Consulted and Agree with Plan of Care Patient      Patient will benefit from skilled therapeutic intervention in order to improve the following deficits and impairments:  Abnormal gait, Decreased activity tolerance, Decreased balance, Decreased safety awareness, Decreased mobility, Decreased strength, Difficulty walking, Increased muscle spasms, Pain  Visit Diagnosis: Pain in right hip  Difficulty in walking, not elsewhere classified  Unsteadiness on feet  Other abnormalities of gait and mobility  Muscle weakness (generalized)       G-Codes - 06-11-16 1332    Functional Assessment Tool Used (Outpatient Only) Clinical judgement   Functional Limitation Mobility: Walking and moving around   Mobility: Walking and Moving Around Current Status ([id number]) At least 20 percent but less than 40 percent impaired, limited or restricted   Mobility: Walking and Moving Around Goal Status [id number]) At least 20 percent but less than 40 percent impaired, limited or  restricted      Problem List Patient Active Problem List   Diagnosis Date Noted  . Type 2 diabetes mellitus with stage 3 chronic kidney disease, with long-term current use of insulin (HCC) 01/29/2016  . Leukocytosis 01/25/2016  . Closed fracture of right hip (HCC)   . Diabetes mellitus with complication (HCC)   . Fall   . Left knee pain 05/28/2015  . Sprain of hand, third finger, right 05/28/2015  . HTN (hypertension) 07/29/2014  . Gait instability 07/29/2014  . Hyperlipidemia 07/29/2014  . Right knee pain 04/11/2014  . ASNHL (asymmetrical sensorineural hearing loss) 08/07/2013  .  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 Hernia, rectovaginal 08/07/2013  . Hypercholesterolemia without hypertriglyceridemia 08/07/2013  . Arthritis of knee, degenerative 08/07/2013  . Degenerative arthritis of lumbar spine 08/07/2013  . Osteopenia 08/07/2013  . Adult hypothyroidism 08/07/2013  . History of artificial joint 08/07/2013  . Essential (primary) hypertension 08/07/2013  . Depression with anxiety 08/07/2013     Kipp Laurence, PT, DPT 05/17/16 2:26 PM   [facility] [address]  [address] [place], [place], [address] Phone: [phone]   Fax:  [phone]  Name: Andilyn Bettcher MRN: [id number] Date of Birth: [birth year]

## 2016-05-19 ENCOUNTER — Ambulatory Visit: Payer: Medicare Other | Admitting: Physical Therapy

## 2016-05-24 ENCOUNTER — Ambulatory Visit: Payer: Medicare Other | Admitting: Physical Therapy

## 2016-05-25 ENCOUNTER — Ambulatory Visit: Payer: Medicare Other | Admitting: Physical Therapy

## 2016-05-25 DIAGNOSIS — R2689 Other abnormalities of gait and mobility: Secondary | ICD-10-CM | POA: Diagnosis not present

## 2016-05-25 DIAGNOSIS — M25551 Pain in right hip: Secondary | ICD-10-CM

## 2016-05-25 DIAGNOSIS — M6281 Muscle weakness (generalized): Secondary | ICD-10-CM | POA: Diagnosis not present

## 2016-05-25 DIAGNOSIS — R2681 Unsteadiness on feet: Secondary | ICD-10-CM

## 2016-05-25 DIAGNOSIS — R262 Difficulty in walking, not elsewhere classified: Secondary | ICD-10-CM | POA: Diagnosis not present

## 2016-05-25 NOTE — Therapy (Signed)
[facility] [address]  [address] [place], [place], [address] Phone: [phone]   Fax:  [phone]  Physical Therapy Treatment  Patient Details  Name: Jessica Meyer MRN: [id number] Date of Birth: [birth year] Referring Provider: Dr. Margarita Rana  Encounter Date: 05/25/2016      PT End of Session - 05/25/16 1321    Visit Number 11   Number of Visits 16   Date for PT Re-Evaluation 06/06/16   Authorization Type Medicare   PT Start Time 1313   PT Stop Time 1359   PT Time Calculation (min) 46 min   Activity Tolerance Patient tolerated treatment well   Behavior During Therapy [facility] for tasks assessed/performed      Past Medical History:  Diagnosis Date  . Arthritis   . Diabetes mellitus without complication (HCC)   . History of chicken pox   . Hyperlipidemia   . Hypertension   . Thyroid disease    hypothyroid    Past Surgical History:  Procedure Laterality Date  . ABDOMINAL HYSTERECTOMY    . HIP ARTHROPLASTY Right 01/26/2016   Procedure: ARTHROPLASTY BIPOLAR HIP (HEMIARTHROPLASTY);  Surgeon: Sheral Apley, MD;  Location: [facility] OR;  Service: Orthopedics;  Laterality: Right;  . KNEE ARTHROSCOPY Bilateral   . REPLACEMENT TOTAL KNEE BILATERAL     1998 left , 1992 right    There were no vitals filed for this visit.      Subjective Assessment - 05/25/16 1314    Subjective Back is feeling better - has been doing exercises given at last session; some stumbles within the home - feels like she doesn't watch where she is going   Patient is accompained by: Family member   Pertinent History HTN, DM   Patient Stated Goals improve pain and balance, return to gardening   Currently in Pain? Yes   Pain Score 3    Pain Location Back   Pain Orientation Lower   Pain Descriptors / Indicators Aching;Sore            [facility] PT Assessment - 05/25/16 0001      Berg Balance Test   Sit to Stand Able to stand without using  hands and stabilize independently   Standing Unsupported Able to stand safely 2 minutes   Sitting with Back Unsupported but Feet Supported on Floor or Stool Able to sit safely and securely 2 minutes   Stand to Sit Sits safely with minimal use of hands   Transfers Able to transfer safely, minor use of hands   Standing Unsupported with Eyes Closed Able to stand 10 seconds safely   Standing Ubsupported with Feet Together Able to place feet together independently and stand 1 minute safely   From Standing, Reach Forward with Outstretched Arm Can reach forward >12 cm safely (5")   From Standing Position, Pick up Object from Floor Able to pick up shoe safely and easily   From Standing Position, Turn to Look Behind Over each Shoulder Looks behind from both sides and weight shifts well   Turn 360 Degrees Needs close supervision or verbal cueing   Standing Unsupported, Alternately Place Feet on Step/Stool Able to complete 4 steps without aid or supervision   Standing Unsupported, One Foot in Front Needs help to step but can hold 15 seconds   Standing on One Leg Able to lift leg independently and hold 5-10 seconds   Total Score 46  [facility] Adult PT Treatment/Exercise - 05/25/16 0001      Neuro Re-ed    Neuro Re-ed Details  Walking with vertical and horizontal head turns - multiple LOB; Berg Activities, standing on foam with vertical and horizontal head turns; standing on foam with cross body diagonals with red medball - heavy VC for head movements     Knee/Hip Exercises: Aerobic   Nustep L6 x 6 minutes                  PT Short Term Goals - 04/25/16 1454      PT SHORT TERM GOAL #1   Title patient to be independent with initial HEP (05/09/16)   Status On-going     PT SHORT TERM GOAL #2   Title Patient to improve Berg Balance score to 40/56 demonstrating improved balance (05/09/16)   Status On-going           PT Long Term Goals - 04/25/16 1454       PT LONG TERM GOAL #1   Title Patient to be independent with advanced HEP for balance and strengthening (06/06/16)   Status On-going     PT LONG TERM GOAL #2   Title Patient to improve Berg Balance to 49/56 demonstrating reduced fall risk (06/06/16)   Status On-going     PT LONG TERM GOAL #3   Title Patient to improve gait velocity to >2.62 feet/second (06/06/16)   Status On-going     PT LONG TERM GOAL #4   Title Patient to demonstrate ability to ambulate >500 feet on a variety of surfaces without use of AD with no evidence of instability or LOB demonstrating improved functional mobility (06/06/16)   Status On-going     PT LONG TERM GOAL #5   Title Patient to improve R hip strength to equal that of L hip with no increase in pain (06/06/16)   Status On-going     PT LONG TERM GOAL #6   Title Patient to report ability to return to gardening with no LOB (06/06/16)   Status On-going               Plan - 05/25/16 1434    Clinical Impression Statement Sharlene Motts testing today wiht great improvement from 29/56 to 46/56 - some tinued difficulty witSL and tandem stance. Patient also taken outside to work on uneven ground with head turns with many episodes of LOB with CGA-Min A from PT to maintain upright balance. Patient to continue to work on more high level balance activities and balance with head turns on compliant surfaces.    PT Treatment/Interventions ADLs/Self Care Home Management;Cryotherapy;Electrical Stimulation;Moist Heat;Ultrasound;Neuromuscular re-education;Balance training;Therapeutic exercise;Therapeutic activities;Functional mobility training;Stair training;Gait training;DME Instruction;Patient/family education;Manual techniques;Scar mobilization;Passive range of motion;Vasopneumatic Device;Taping;Dry needling   Consulted and Agree with Plan of Care Patient      Patient will benefit from skilled therapeutic intervention in order to improve the following deficits and impairments:  Abnormal  gait, Decreased activity tolerance, Decreased balance, Decreased safety awareness, Decreased mobility, Decreased strength, Difficulty walking, Increased muscle spasms, Pain  Visit Diagnosis: Pain in right hip  Difficulty in walking, not elsewhere classified  Unsteadiness on feet  Other abnormalities of gait and mobility  Muscle weakness (generalized)     Problem List Patient Active Problem List   Diagnosis Date Noted  . Type 2 diabetes mellitus with stage 3 chronic kidney disease, with long-term current use of insulin (HCC) 01/29/2016  . Leukocytosis 01/25/2016  . Closed fracture of right hip (HCC)   .  Diabetes mellitus with complication (HCC)   . Fall   . Left knee pain 05/28/2015  . Sprain of hand, third finger, right 05/28/2015  . HTN (hypertension) 07/29/2014  . Gait instability 07/29/2014  . Hyperlipidemia 07/29/2014  . Right knee pain 04/11/2014  . ASNHL (asymmetrical sensorineural hearing loss) 08/07/2013  . 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 Hernia, rectovaginal 08/07/2013  . Hypercholesterolemia without hypertriglyceridemia 08/07/2013  . Arthritis of knee, degenerative 08/07/2013  . Degenerative arthritis of lumbar spine 08/07/2013  . Osteopenia 08/07/2013  . Adult hypothyroidism 08/07/2013  . History of artificial joint 08/07/2013  . Essential (primary) hypertension 08/07/2013  . Depression with anxiety 08/07/2013       Kipp LaurenceStephanie R Aaron, PT, DPT 05/25/16 4:49 PM   [facility] [place] [address]  [address] [place], [place], [address] Phone: [phone]   Fax:  [phone]  Name: Jessica Meyer MRN: [id number] Date of Birth: 08/28/1935

## 2016-05-26 ENCOUNTER — Ambulatory Visit: Payer: Medicare Other | Admitting: Physical Therapy

## 2016-05-26 DIAGNOSIS — R2689 Other abnormalities of gait and mobility: Secondary | ICD-10-CM | POA: Diagnosis not present

## 2016-05-26 DIAGNOSIS — M6281 Muscle weakness (generalized): Secondary | ICD-10-CM

## 2016-05-26 DIAGNOSIS — M25551 Pain in right hip: Secondary | ICD-10-CM | POA: Diagnosis not present

## 2016-05-26 DIAGNOSIS — R262 Difficulty in walking, not elsewhere classified: Secondary | ICD-10-CM

## 2016-05-26 DIAGNOSIS — R2681 Unsteadiness on feet: Secondary | ICD-10-CM | POA: Diagnosis not present

## 2016-05-26 NOTE — Therapy (Addendum)
[facility] [address]  [place], [place], [address] Phone: [phone]   Fax:  [phone]  Physical Therapy Treatment  Patient Details  Name: Jessica Meyer MRN: [id number] Date of Birth: 1935/11/10 Referring Provider: Dr. Edmonia Lynch  Encounter Date: 05/26/2016      PT End of Session - 05/26/16 1321    Visit Number 12   Number of Visits 16   Date for PT Re-Evaluation 06/06/16   Authorization Type Medicare   PT Start Time 1316   PT Stop Time 1357   PT Time Calculation (min) 41 min   Activity Tolerance Patient tolerated treatment well   Behavior During Therapy [facility] for tasks assessed/performed      Past Medical History:  Diagnosis Date  . Arthritis   . Diabetes mellitus without complication ([place])   . History of chicken pox   . Hyperlipidemia   . Hypertension   . Thyroid disease    hypothyroid    Past Surgical History:  Procedure Laterality Date  . ABDOMINAL HYSTERECTOMY    . HIP ARTHROPLASTY Right 01/26/2016   Procedure: ARTHROPLASTY BIPOLAR HIP (HEMIARTHROPLASTY);  Surgeon: Renette Butters, MD;  Location: [facility];  Service: Orthopedics;  Laterality: Right;  . KNEE ARTHROSCOPY Bilateral   . REPLACEMENT TOTAL KNEE BILATERAL     1998 left , 1992 right    There were no vitals filed for this visit.      Subjective Assessment - 05/26/16 1318    Subjective hasn't been out in the yard due to overall wetness - feeling well today, no new complaints   Pertinent History HTN, DM   Patient Stated Goals improve pain and balance, return to gardening   Currently in Pain? No/denies   Pain Score 0-No pain                         [facility] Adult PT Treatment/Exercise - 05/26/16 0001      Knee/Hip Exercises: Aerobic   Nustep L6 x 6 minutes             Balance Exercises - 05/26/16 1520      Balance Exercises: Standing   Tandem Stance Eyes open;Foam/compliant surface;3 reps;30 secs   BLE    Tandem Gait Forward;Retro  125 feet each direction, CGA to SUP   Other Standing Exercises standing on AirEx - cross body diagonals with yellow med ball (4 directions x 10 each), reaching and tossing bean bags x 12 each side, side stepping on AirEx balance beam x 5 laps, fwd/retro gait on AirEx balance beam x 5 laps             PT Short Term Goals - 04/25/16 1454      PT SHORT TERM GOAL #1   Title patient to be independent with initial HEP (05/09/16)   Status On-going     PT SHORT TERM GOAL #2   Title Patient to improve Berg Balance score to 40/56 demonstrating improved balance (05/09/16)   Status On-going           PT Long Term Goals - 04/25/16 1454      PT LONG TERM GOAL #1   Title Patient to be independent with advanced HEP for balance and strengthening (06/06/16)   Status On-going     PT LONG TERM GOAL #2   Title Patient to improve Berg Balance to 49/56 demonstrating reduced fall risk (06/06/16)   Status On-going  PT LONG TERM GOAL #3   Title Patient to improve gait velocity to >2.62 feet/second (06/06/16)   Status On-going     PT LONG TERM GOAL #4   Title Patient to demonstrate ability to ambulate >500 feet on a variety of surfaces without use of AD with no evidence of instability or LOB demonstrating improved functional mobility (06/06/16)   Status On-going     PT LONG TERM GOAL #5   Title Patient to improve R hip strength to equal that of L hip with no increase in pain (06/06/16)   Status On-going     PT LONG TERM GOAL #6   Title Patient to report ability to return to gardening with no LOB (06/06/16)   Status On-going               Plan - 05/26/16 1321    Clinical Impression Statement Jessica Meyer doing well today - much improved walking backwards and tandem walking today with very few LOB episodes. Patient doing well on compliant surfaces today, much improved since time of inital eval. Will plan to continue to progress on compliant surfaces.    PT  Treatment/Interventions ADLs/Self Care Home Management;Cryotherapy;Electrical Stimulation;Moist Heat;Ultrasound;Neuromuscular re-education;Balance training;Therapeutic exercise;Therapeutic activities;Functional mobility training;Stair training;Gait training;DME Instruction;Patient/family education;Manual techniques;Scar mobilization;Passive range of motion;Vasopneumatic Device;Taping;Dry needling   Consulted and Agree with Plan of Care Patient      Patient will benefit from skilled therapeutic intervention in order to improve the following deficits and impairments:  Abnormal gait, Decreased activity tolerance, Decreased balance, Decreased safety awareness, Decreased mobility, Decreased strength, Difficulty walking, Increased muscle spasms, Pain  Visit Diagnosis: Pain in right hip  Difficulty in walking, not elsewhere classified  Unsteadiness on feet  Other abnormalities of gait and mobility  Muscle weakness (generalized)     G-Codes   Functional Assessment Tool Used (Outpatient Only) Clinical judgement   Functional Limitation Mobility: Walking and moving around   Mobility: Walking and Moving Around Goal Status [phone]) At least 20 percent but less than 40 percent impaired, limited or restricted   Mobility: Walking and Moving Around Discharge Status [phone]) At least 20 percent but less than 40 percent impaired, limited or restricted     Problem List Patient Active Problem List   Diagnosis Date Noted  . Type 2 diabetes mellitus with stage 3 chronic kidney disease, with long-term current use of insulin ([place]) 01/29/2016  . Leukocytosis 01/25/2016  . Closed fracture of right hip ([place])   . Diabetes mellitus with complication ([place])   . Fall   . Left knee pain 05/28/2015  . Sprain of hand, third finger, right 05/28/2015  . HTN (hypertension) 07/29/2014  . Gait instability 07/29/2014  . Hyperlipidemia 07/29/2014  . Right knee pain 04/11/2014  . ASNHL (asymmetrical sensorineural  hearing loss) 08/07/2013  . 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 Hernia, rectovaginal 08/07/2013  . Hypercholesterolemia without hypertriglyceridemia 08/07/2013  . Arthritis of knee, degenerative 08/07/2013  . Degenerative arthritis of lumbar spine 08/07/2013  . Osteopenia 08/07/2013  . Adult hypothyroidism 08/07/2013  . History of artificial joint 08/07/2013  . Essential (primary) hypertension 08/07/2013  . Depression with anxiety 08/07/2013     Lanney Gins, PT, DPT 05/26/16 4:21 PM   PHYSICAL THERAPY DISCHARGE SUMMARY  Visits from Start of Care: 12  Current functional level related to goals / functional outcomes: See above   Remaining deficits: See above; some balance deficits remain, particularly with compliant surfaces   Education / Equipment: HEP  Plan: Patient agrees to discharge.  Patient goals were not met. Patient is being discharged due to not returning since the last visit.  ?????  Patients family calling to alert PT that patient had fallen and had some ankle pain. PT reaching out to PCP regarding injury. Will gladly see patient in the future for any other needs.   Lanney Gins, PT, DPT 07/25/16 9:26 AM     [facility] [address]  [place], [place], [address] Phone: [phone]   Fax:  [phone]  Name: Jessica Meyer MRN: [id number] Date of Birth: 06-12-1935

## 2016-05-31 ENCOUNTER — Ambulatory Visit: Payer: Medicare Other | Admitting: Physical Therapy

## 2016-05-31 ENCOUNTER — Telehealth: Payer: Self-pay | Admitting: Family

## 2016-05-31 NOTE — Telephone Encounter (Signed)
I received a message that Jessica Meyer sprained her ankle. If it is still causing her pain I would like to schedule an OV to see her so we can xray her ankle please.

## 2016-05-31 NOTE — Telephone Encounter (Signed)
Left message for pt to return my call.

## 2016-06-01 NOTE — Telephone Encounter (Signed)
Spoke with pt's daughter. She states that ankle appears to be improving overall but still has some soreness. She will talk with pt tonight and if still having pain she will call to schedule an appointment.

## 2016-06-02 ENCOUNTER — Ambulatory Visit: Payer: Medicare Other | Admitting: Physical Therapy

## 2016-06-14 ENCOUNTER — Encounter: Payer: Self-pay | Admitting: Family Medicine

## 2016-06-14 ENCOUNTER — Ambulatory Visit (INDEPENDENT_AMBULATORY_CARE_PROVIDER_SITE_OTHER): Payer: Medicare Other | Admitting: Family Medicine

## 2016-06-14 ENCOUNTER — Telehealth: Payer: Self-pay | Admitting: Behavioral Health

## 2016-06-14 VITALS — BP 128/82 | HR 57 | Temp 97.9°F | Resp 16 | Ht 65.0 in | Wt 143.4 lb

## 2016-06-14 DIAGNOSIS — M25552 Pain in left hip: Secondary | ICD-10-CM | POA: Insufficient documentation

## 2016-06-14 MED ORDER — TIZANIDINE HCL 4 MG PO TABS: 4.0000 mg | ORAL_TABLET | Freq: Four times a day (QID) | ORAL | 1 refills | Status: DC | PRN

## 2016-06-14 MED ORDER — PREDNISONE 20 MG PO TABS: 40.0000 mg | ORAL_TABLET | Freq: Every day | ORAL | 0 refills | Status: DC

## 2016-06-14 MED ORDER — TRAMADOL HCL 50 MG PO TABS: 50.0000 mg | ORAL_TABLET | Freq: Three times a day (TID) | ORAL | 0 refills | Status: DC | PRN

## 2016-06-14 NOTE — Telephone Encounter (Signed)
Per patient's daughter, she believes that the pain is muscular in origin, affecting her hip & leg as well; not severe at this time. She would still like to keep the appointment for this evening.

## 2016-06-14 NOTE — Patient Instructions (Signed)
Hip Pain The hip is the joint between the upper legs and the lower pelvis. The bones, cartilage, tendons, and muscles of your hip joint support your body and allow you to move around. Hip pain can range from a minor ache to severe pain in one or both of your hips. The pain may be felt on the inside of the hip joint near the groin, or the outside near the buttocks and upper thigh. You may also have swelling or stiffness. Follow these instructions at home: Managing pain, stiffness, and swelling   If directed, apply ice to the injured area.  Put ice in a plastic bag.  Place a towel between your skin and the bag.  Leave the ice on for 20 minutes, 2-3 times a day  Sleep with a pillow between your legs on your most comfortable side.  Avoid any activities that cause pain. General instructions   Take over-the-counter and prescription medicines only as told by your health care provider.  Do any exercises as told by your health care provider.  Record the following:  How often you have hip pain.  The location of your pain.  What the pain feels like.  What makes the pain worse.  Keep all follow-up visits as told by your health care provider. This is important. Contact a health care provider if:  You cannot put weight on your leg.  Your pain or swelling continues or gets worse after one week.  It gets harder to walk.  You have a fever. Get help right away if:  You fall.  You have a sudden increase in pain and swelling in your hip.  Your hip is red or swollen or very tender to touch. Summary  Hip pain can range from a minor ache to severe pain in one or both of your hips.  The pain may be felt on the inside of the hip joint near the groin, or the outside near the buttocks and upper thigh.  Avoid any activities that cause pain.  Record how often you have hip pain, the location of the pain, what makes it worse and what it feels like. This information is not intended to  replace advice given to you by your health care provider. Make sure you discuss any questions you have with your health care provider. Document Released: 06/09/2009 Document Revised: 11/23/2015 Document Reviewed: 11/23/2015 Elsevier Interactive Patient Education  2017 Elsevier Inc.  

## 2016-06-14 NOTE — Telephone Encounter (Signed)
Called to follow-up with the patient/caregiver regarding today's upcoming appointment of navel or possible abdominal pain per Dr. Laury AxonLowne Chase's request.

## 2016-06-14 NOTE — Progress Notes (Signed)
Patient ID: Jessica Meyer, female   DOB: 03/12/1935, 81 y.o.   MRN: [id number]     Subjective:  I acted as a Education administrator for Dr. Carollee Herter.  Guerry Bruin, [place]   Patient ID: Jessica Meyer, female    DOB: 08/31/1935, 81 y.o.   MRN: [id number]  Chief Complaint  Patient presents with  . left side pain    HPI  Patient is in today for left side pain. The pain started at left ankle 3 weeks ago when she tripped on stairs and twisted foot.  The pain has know moved up the left leg to her left side.  She has taken Aleve for the pain with not much relief.  Has not been able to sleep because of pain.  Patient Care Team: Debbrah Alar, NP as PCP - General (Internal Medicine)   Past Medical History:  Diagnosis Date  . Arthritis   . Diabetes mellitus without complication ([place])   . History of chicken pox   . Hyperlipidemia   . Hypertension   . Thyroid disease    hypothyroid    Past Surgical History:  Procedure Laterality Date  . ABDOMINAL HYSTERECTOMY    . HIP ARTHROPLASTY Right 01/26/2016   Procedure: ARTHROPLASTY BIPOLAR HIP (HEMIARTHROPLASTY);  Surgeon: Renette Butters, MD;  Location: [facility];  Service: Orthopedics;  Laterality: Right;  . KNEE ARTHROSCOPY Bilateral   . REPLACEMENT TOTAL KNEE BILATERAL     1998 left , 1992 right    Family History  Problem Relation Age of Onset  . Arthritis Mother   . Depression Mother   . Heart attack Father        64   . Hypertension Father     Social History   Social History  . Marital status: Single    Spouse name: N/A  . Number of children: N/A  . Years of education: N/A   Occupational History  . Not on file.   Social History Main Topics  . Smoking status: Never Smoker  . Smokeless tobacco: Current User    Types: Snuff  . Alcohol use 0.6 oz/week    1 Standard drinks or equivalent per week     Comment: occasional beer  . Drug use: No  . Sexual activity: Not on file   Other Topics Concern  . Not on file   Social History  Narrative   Retired homemaker   Widow   Completed 7th grade   2 daughter   [name] and daughter. Daughter local, son in [place]   4 grandchildren   Enjoys puzzles, gardening.       Outpatient Medications Prior to Visit  Medication Sig Dispense Refill  . aspirin EC 81 MG tablet Take 81 mg by mouth daily.    Marland Kitchen atorvastatin (LIPITOR) 10 MG tablet TAKE ONE TABLET BY MOUTH AT BEDTIME 90 tablet 1  . B-D UF III MINI PEN NEEDLES 31G X 5 MM MISC USE DAILY WITH LANTUS SOLOSTAR PENS 100 each 2  . Blood Glucose Monitoring Suppl (ACCU-CHEK AVIVA PLUS) w/Device KIT Use to check blood sugar once a day.  DX E11.9 1 kit 0  . buPROPion (WELLBUTRIN) 100 MG tablet TAKE ONE TABLET BY MOUTH TWO TIMES A DAY 60 tablet 2  . carvedilol (COREG) 12.5 MG tablet TAKE 1 TABLET BY MOUTH TWO TIMES A DAY WITH MEAL 180 tablet 0  . glucose blood (ACCU-CHEK AVIVA PLUS) test strip Use as instructed to check blood sugar twice a day.  DX E11.9 200  each 1  . hydrOXYzine (ATARAX/VISTARIL) 25 MG tablet Take 1 tablet (25 mg total) by mouth 2 (two) times daily as needed. 60 tablet 5  . Lancet Devices (ACCU-CHEK SOFTCLIX) lancets Use to check blood sugar twice a day.  DX E11.9 100 each 5  . LANTUS SOLOSTAR 100 UNIT/ML Solostar Pen INJECT 14 UNITS INTO THE SKIN BEFORE LUNCH 15 mL 1  . levothyroxine (SYNTHROID, LEVOTHROID) 125 MCG tablet Take 0.5 tablets (62.5 mcg total) by mouth daily before breakfast. 45 tablet 1  . linagliptin (TRADJENTA) 5 MG TABS tablet Take 1 tablet (5 mg total) by mouth daily. 30 tablet 11  . lisinopril (PRINIVIL,ZESTRIL) 40 MG tablet TAKE ONE TABLET BY MOUTH DAILY 90 tablet 1  . loratadine (CLARITIN) 10 MG tablet Take 10 mg by mouth daily.    . rivaroxaban (XARELTO) 10 MG TABS tablet Take 1 tablet (10 mg total) by mouth daily. 21 tablet 0  . amLODipine (NORVASC) 5 MG tablet TAKE ONE TABLET BY MOUTH DAILY (Patient not taking: Reported on 05/09/2016) 90 tablet 1   No facility-administered medications prior to visit.      No Known Allergies  Review of Systems  Constitutional: Negative for fever and malaise/fatigue.  HENT: Negative for congestion.   Eyes: Negative for blurred vision.  Respiratory: Negative for cough and shortness of breath.   Cardiovascular: Negative for chest pain, palpitations and leg swelling.  Gastrointestinal: Negative for vomiting.  Musculoskeletal: Positive for joint pain. Negative for back pain.       Left leg pain, hip pain, and side pain.  Skin: Negative for rash.  Neurological: Negative for loss of consciousness and headaches.       Objective:    Physical Exam  BP 128/82 (BP Location: Left Arm, Cuff Size: Normal)   Pulse (!) 57   Temp 97.9 F (36.6 C) (Oral)   Resp 16   Ht 5' 5" (1.651 m)   Wt 143 lb 6.4 oz (65 kg)   SpO2 97%   BMI 23.86 kg/m  Wt Readings from Last 3 Encounters:  06/14/16 143 lb 6.4 oz (65 kg)  05/09/16 143 lb (64.9 kg)  03/07/16 143 lb (64.9 kg)   BP Readings from Last 3 Encounters:  06/14/16 128/82  05/09/16 118/78  03/07/16 120/82     Immunization History  Administered Date(s) Administered  . Influenza, High Dose Seasonal PF 10/13/2014, 09/14/2015  . Influenza-Unspecified 10/03/2013  . Pneumococcal Conjugate-13 10/03/2013  . Pneumococcal-Unspecified 01/03/2009  . Td 03/12/2007  . Tdap 01/04/2012  . Zoster 01/04/2011    Health Maintenance  Topic Date Due  . DEXA SCAN  12/09/2002  . INFLUENZA VACCINE  08/03/2016  . HEMOGLOBIN A1C  11/09/2016  . FOOT EXAM  01/10/2017  . OPHTHALMOLOGY EXAM  03/07/2017  . TETANUS/TDAP  01/03/2022  . PNA vac Low Risk Adult  Completed    Lab Results  Component Value Date   WBC 11.5 (H) 01/27/2016   HGB 10.5 (L) 01/27/2016   HCT 31.7 (L) 01/27/2016   PLT 160 01/27/2016   GLUCOSE 182 (H) 01/26/2016   CHOL 150 05/11/2015   TRIG 174.0 (H) 05/11/2015   HDL 43.80 05/11/2015   LDLCALC 71 05/11/2015   ALT 13 (L) 11/07/2015   AST 19 11/07/2015   NA 137 01/26/2016   K 4.4 01/26/2016   CL  108 01/26/2016   CREATININE 1.30 (H) 01/26/2016   BUN 17 01/26/2016   CO2 24 01/26/2016   TSH 3.80 01/15/2016   INR 1.12 01/26/2016  HGBA1C 8.6 05/09/2016   MICROALBUR <0.7 05/11/2015    Lab Results  Component Value Date   TSH 3.80 01/15/2016   Lab Results  Component Value Date   WBC 11.5 (H) 01/27/2016   HGB 10.5 (L) 01/27/2016   HCT 31.7 (L) 01/27/2016   MCV 91.1 01/27/2016   PLT 160 01/27/2016   Lab Results  Component Value Date   NA 137 01/26/2016   K 4.4 01/26/2016   CO2 24 01/26/2016   GLUCOSE 182 (H) 01/26/2016   BUN 17 01/26/2016   CREATININE 1.30 (H) 01/26/2016   BILITOT 0.7 11/07/2015   ALKPHOS 61 11/07/2015   AST 19 11/07/2015   ALT 13 (L) 11/07/2015   PROT 7.4 11/07/2015   ALBUMIN 4.6 11/07/2015   CALCIUM 9.1 01/26/2016   ANIONGAP 5 01/26/2016   GFR 34.88 (L) 01/15/2016   Lab Results  Component Value Date   CHOL 150 05/11/2015   Lab Results  Component Value Date   HDL 43.80 05/11/2015   Lab Results  Component Value Date   LDLCALC 71 05/11/2015   Lab Results  Component Value Date   TRIG 174.0 (H) 05/11/2015   Lab Results  Component Value Date   CHOLHDL 3 05/11/2015   Lab Results  Component Value Date   HGBA1C 8.6 05/09/2016         Assessment & Plan:   Problem List Items Addressed This Visit    None    Visit Diagnoses    Left hip pain    -  Primary   Relevant Medications   predniSONE (DELTASONE) 20 MG tablet   traMADol (ULTRAM) 50 MG tablet      I have discontinued Ms. Mccasland's amLODipine and rivaroxaban. I am also having her start on predniSONE and traMADol. Additionally, I am having her maintain her aspirin EC, B-D UF III MINI PEN NEEDLES, glucose blood, ACCU-CHEK AVIVA PLUS, accu-chek softclix, LANTUS SOLOSTAR, atorvastatin, hydrOXYzine, levothyroxine, lisinopril, buPROPion, carvedilol, loratadine, and linagliptin.  Meds ordered this encounter  Medications  . predniSONE (DELTASONE) 20 MG tablet    Sig: Take 2 tablets  (40 mg total) by mouth daily.    Dispense:  10 tablet    Refill:  0  . traMADol (ULTRAM) 50 MG tablet    Sig: Take 1 tablet (50 mg total) by mouth every 8 (eight) hours as needed.    Dispense:  30 tablet    Refill:  0    CMA served as scribe during this visit. History, Physical and Plan performed by medical provider. Documentation and orders reviewed and attested to.  Yvonne R Lowne Chase, DO  

## 2016-06-14 NOTE — Assessment & Plan Note (Signed)
toradol im Pain meds per orders pred taper  Refer to ortho

## 2016-06-21 ENCOUNTER — Other Ambulatory Visit: Payer: Self-pay | Admitting: Family

## 2016-06-22 MED ORDER — GLUCOSE BLOOD VI STRP: ORAL_STRIP | 0 refills | Status: DC

## 2016-06-22 NOTE — Addendum Note (Signed)
Addended by: Mervin KungFERGERSON, Jacquelinne Speak A on: 06/22/2016 01:02 PM   Modules accepted: Orders

## 2016-06-22 NOTE — Telephone Encounter (Addendum)
[facility] needs diagnoses code on Rx. Dx code attached and Rx re-sent.

## 2016-07-07 ENCOUNTER — Other Ambulatory Visit: Payer: Self-pay | Admitting: Family

## 2016-07-11 ENCOUNTER — Other Ambulatory Visit: Payer: Self-pay | Admitting: *Deleted

## 2016-07-11 NOTE — Progress Notes (Signed)
Faxed refill request received from [facility] for Levothyroxine 50 mcg Last filled by MD on 07/08/16 Sent Denial back to pharmacy - Too Soon for request/SLS 07/09

## 2016-07-13 ENCOUNTER — Other Ambulatory Visit: Payer: Self-pay | Admitting: Family

## 2016-07-18 ENCOUNTER — Encounter: Payer: Self-pay | Admitting: Family

## 2016-07-18 ENCOUNTER — Ambulatory Visit (INDEPENDENT_AMBULATORY_CARE_PROVIDER_SITE_OTHER): Payer: Medicare Other | Admitting: Family

## 2016-07-18 VITALS — BP 159/73 | HR 77 | Temp 98.5°F | Resp 18 | Ht 68.0 in | Wt 144.6 lb

## 2016-07-18 DIAGNOSIS — I10 Essential (primary) hypertension: Secondary | ICD-10-CM

## 2016-07-18 DIAGNOSIS — E039 Hypothyroidism, unspecified: Secondary | ICD-10-CM

## 2016-07-18 MED ORDER — AMLODIPINE BESYLATE 5 MG PO TABS: 5.0000 mg | ORAL_TABLET | Freq: Every day | ORAL | 3 refills | Status: DC

## 2016-07-18 NOTE — Patient Instructions (Addendum)
Restart amlodipine once daily. Continue synthroid once daily until we review your lab work- we will contact you with further recommendations.

## 2016-07-18 NOTE — Progress Notes (Signed)
 Subjective:    Patient ID: Anastyn Ann Dorr, female    DOB: 08/09/1935, 81 y.o.   MRN: [id number]  HPI   Ms. Zervas is a 81 yr old female who presents today for follow up. She is following with endocrinology or her diabetes.    HTN- on lisinopril and carvedilol.  She has been on amlodipine in the past but notes that she has not taken in some time.  BP Readings from Last 3 Encounters:  07/18/16 (!) 159/73  06/14/16 128/82  05/09/16 118/78   Hypothryoid- She is maintained on synthroid.  Most recently we had her cutting a synthroid 125 mcg in half. However, pharmacy requested a 50 mcg tab which was sent in about a week ago so she has been taking this dose.  Lab Results  Component Value Date   TSH 3.80 01/15/2016      Review of Systems    see HPI  Past Medical History:  Diagnosis Date  . Arthritis   . Diabetes mellitus without complication ([facility])   . History of chicken pox   . Hyperlipidemia   . Hypertension   . Thyroid disease    hypothyroid     Social History   Social History  . Marital status: Single    Spouse name: N/A  . Number of children: N/A  . Years of education: N/A   Occupational History  . Not on file.   Social History Main Topics  . Smoking status: Never Smoker  . Smokeless tobacco: Current User    Types: Snuff  . Alcohol use 0.6 oz/week    1 Standard drinks or equivalent per week     Comment: occasional beer  . Drug use: No  . Sexual activity: Not on file   Other Topics Concern  . Not on file   Social History Narrative   Retired homemaker   Widow   Completed 7th grade   2 daughter   Son and daughter. Daughter local, son in [place]   4 grandchildren   Enjoys puzzles, gardening.       Past Surgical History:  Procedure Laterality Date  . ABDOMINAL HYSTERECTOMY    . HIP ARTHROPLASTY Right 01/26/2016   Procedure: ARTHROPLASTY BIPOLAR HIP (HEMIARTHROPLASTY);  Surgeon: Timothy D Murphy, MD;  Location: [facility];  Service: Orthopedics;  Laterality:  Right;  . KNEE ARTHROSCOPY Bilateral   . REPLACEMENT TOTAL KNEE BILATERAL     1998 left , 1992 right    Family History  Problem Relation Age of Onset  . Arthritis Mother   . Depression Mother   . Heart attack Father        57   . Hypertension Father     No Known Allergies  Current Outpatient Prescriptions on File Prior to Visit  Medication Sig Dispense Refill  . aspirin EC 81 MG tablet Take 81 mg by mouth daily.    . atorvastatin (LIPITOR) 10 MG tablet TAKE ONE TABLET BY MOUTH AT BEDTIME 90 tablet 1  . B-D UF III MINI PEN NEEDLES 31G X 5 MM MISC USE DAILY WITH LANTUS SOLOSTAR PENS 100 each 2  . Blood Glucose Monitoring Suppl (ACCU-CHEK AVIVA PLUS) w/Device KIT Use to check blood sugar once a day.  DX E11.9 1 kit 0  . buPROPion (WELLBUTRIN) 100 MG tablet TAKE ONE TABLET BY MOUTH TWO TIMES A DAY 60 tablet 2  . carvedilol (COREG) 12.5 MG tablet TAKE 1 TABLET BY MOUTH TWO TIMES A DAY WITH MEAL 180 tablet   0  . glucose blood (ACCU-CHEK AVIVA PLUS) test strip TEST TWO TIMES A DAY.  Dx E11.8 200 each 0  . hydrOXYzine (ATARAX/VISTARIL) 25 MG tablet Take 1 tablet (25 mg total) by mouth 2 (two) times daily as needed. 60 tablet 5  . Lancet Devices (ACCU-CHEK SOFTCLIX) lancets Use to check blood sugar twice a day.  DX E11.9 100 each 5  . LANTUS SOLOSTAR 100 UNIT/ML Solostar Pen INJECT 14 UNITS INTO THE SKIN BEFORE LUNCH 15 mL 1  . levothyroxine (SYNTHROID, LEVOTHROID) 50 MCG tablet TAKE ONE TABLET BY MOUTH DAILY 30 tablet 0  . linagliptin (TRADJENTA) 5 MG TABS tablet Take 1 tablet (5 mg total) by mouth daily. 30 tablet 11  . lisinopril (PRINIVIL,ZESTRIL) 40 MG tablet TAKE ONE TABLET BY MOUTH DAILY 90 tablet 1  . tiZANidine (ZANAFLEX) 4 MG tablet Take 1 tablet (4 mg total) by mouth every 6 (six) hours as needed for muscle spasms. 30 tablet 1   No current facility-administered medications on file prior to visit.     BP (!) 159/73 (BP Location: Right Arm, Cuff Size: Normal)   Pulse 77   Temp  98.5 F (36.9 C) (Oral)   Resp 18   Ht 5' 8" (1.727 m)   Wt 144 lb 9.6 oz (65.6 kg)   SpO2 99%   BMI 21.99 kg/m    Objective:   Physical Exam  Constitutional: She is oriented to person, place, and time. She appears well-developed and well-nourished.  Cardiovascular: Normal rate, regular rhythm and normal heart sounds.   No murmur heard. Pulmonary/Chest: Effort normal and breath sounds normal. No respiratory distress. She has no wheezes.  Musculoskeletal: She exhibits no edema.  Neurological: She is alert and oriented to person, place, and time.  Skin: Skin is warm and dry.  Psychiatric: She has a normal mood and affect. Her behavior is normal. Judgment and thought content normal.          Assessment & Plan:   

## 2016-07-19 ENCOUNTER — Other Ambulatory Visit: Payer: Self-pay | Admitting: Family

## 2016-07-19 ENCOUNTER — Encounter: Payer: Self-pay | Admitting: Family

## 2016-07-19 DIAGNOSIS — N289 Disorder of kidney and ureter, unspecified: Secondary | ICD-10-CM

## 2016-07-19 LAB — BASIC METABOLIC PANEL
BUN: 27 mg/dL — ABNORMAL HIGH (ref 6–23)
CALCIUM: 10.1 mg/dL (ref 8.4–10.5)
CHLORIDE: 104 meq/L (ref 96–112)
CO2: 27 meq/L (ref 19–32)
CREATININE: 1.51 mg/dL — AB (ref 0.40–1.20)
GFR: 35.37 mL/min — ABNORMAL LOW (ref >60.00)
GLUCOSE: 126 mg/dL — AB (ref 70–99)
Potassium: 4.5 mEq/L (ref 3.5–5.1)
Sodium: 140 mEq/L (ref 135–145)

## 2016-07-19 LAB — TSH: TSH: 1.16 u[IU]/mL (ref 0.35–4.50)

## 2016-07-19 MED ORDER — LEVOTHYROXINE SODIUM 125 MCG PO TABS: ORAL_TABLET | ORAL | 1 refills | Status: DC

## 2016-07-19 NOTE — Assessment & Plan Note (Signed)
Will obtain follow up TSH. If stable, plan to send refill for the 1/2 tab once daily.

## 2016-07-19 NOTE — Assessment & Plan Note (Signed)
BP is elevated. Will add back amlodipine 5mg  once daily.

## 2016-07-23 NOTE — Telephone Encounter (Signed)
Please contact daughter re: unread mychart message.

## 2016-07-30 ENCOUNTER — Other Ambulatory Visit: Payer: Self-pay | Admitting: Family

## 2016-08-01 NOTE — Telephone Encounter (Signed)
Left message for pt's daughter to return my call

## 2016-08-02 ENCOUNTER — Other Ambulatory Visit: Payer: Self-pay | Admitting: Family

## 2016-08-02 ENCOUNTER — Ambulatory Visit (INDEPENDENT_AMBULATORY_CARE_PROVIDER_SITE_OTHER): Payer: Medicare Other | Admitting: Family Medicine

## 2016-08-02 VITALS — BP 136/61 | HR 74

## 2016-08-02 DIAGNOSIS — I10 Essential (primary) hypertension: Secondary | ICD-10-CM

## 2016-08-02 NOTE — Patient Instructions (Signed)
Per Dr. Abner GreenspanBlyth: Continue current medications & regimen. Return in 3 months for a follow-up visit with PCP.

## 2016-08-02 NOTE — Progress Notes (Signed)
Nurseblood pressure check note reviewed. Agree with documention and plan. 

## 2016-08-02 NOTE — Progress Notes (Signed)
Pre visit review using our clinic review tool, if applicable. No additional management support is needed unless otherwise documented below in the visit note.  Patient presents in office for blood pressure check per OV note 07/18/16. She voices compliance with medications. Patient denies dizziness, lightheadedness & headaches. RN obtained the following readings during today's visit: BP 136/61 P 74 & O2 98%.  Per Dr. Abner GreenspanBlyth: Continue current medications & regimen. Return in 3 months for a follow-up visit with PCP.  Informed patient of the provider's recommendations. She verbalized understanding. Next appointment scheduled for 11/07/16 at 5:30 PM.    RN blood pressure check note reviewed. Agree with documention and plan.

## 2016-08-05 NOTE — Telephone Encounter (Signed)
Notified  Jessica Meyer of above message / results and she voices understanding. Thinks pt has been taking Aleve and she will remove those and make sure pt is taking tylenol instead. They have already picked up the new levothyroxine dose and pt has been taking that since last week. Pt has already been in for nurse visit and has upcoming appt with Dr Elvera LennoxGherghe on 08/10/16 and will see if they can include kidney function in labs that she will check. If they are unable to do testing she will call us back to schedule lab appt here.

## 2016-08-09 ENCOUNTER — Other Ambulatory Visit: Payer: Self-pay | Admitting: Family

## 2016-08-10 ENCOUNTER — Ambulatory Visit (INDEPENDENT_AMBULATORY_CARE_PROVIDER_SITE_OTHER): Payer: Medicare Other | Admitting: Internal Medicine

## 2016-08-10 ENCOUNTER — Encounter: Payer: Self-pay | Admitting: Internal Medicine

## 2016-08-10 VITALS — BP 128/72 | HR 74 | Wt 143.0 lb

## 2016-08-10 DIAGNOSIS — N183 Chronic kidney disease, stage 3 unspecified: Secondary | ICD-10-CM

## 2016-08-10 DIAGNOSIS — Z794 Long term (current) use of insulin: Secondary | ICD-10-CM | POA: Diagnosis not present

## 2016-08-10 DIAGNOSIS — N1832 Chronic kidney disease, stage 3b: Secondary | ICD-10-CM

## 2016-08-10 DIAGNOSIS — E1122 Type 2 diabetes mellitus with diabetic chronic kidney disease: Secondary | ICD-10-CM

## 2016-08-10 NOTE — Progress Notes (Signed)
Patient ID: Jessica Meyer, female   DOB: 05/06/1935, 80 y.o.   MRN: [id number]   HPI: Jessica Meyer is a 80 y.o.-year-old female, returning for returning for follow-up for DM2, dx in 1990s, insulin-dependent since ~2012, uncontrolled, with complications (CKD stage 3-4). Last visit 3 months ago. She is here with her daughter who offers most of the history, especially regarding her medication doses, eating habits, and CBG collection.  Last hemoglobin A1c was: Lab Results  Component Value Date   HGBA1C 8.6 05/09/2016   HGBA1C 8.6 (H) 01/25/2016   HGBA1C 8.7 (H) 01/15/2016   Pt is on a regimen of: - Lantus 14 units at bedtime >> moved to am - Tradjenta 5 mg daily before breakfast - added 05/2016  Pt checks her sugars 2x a day: - am: 117-140 >> 109-167 - 2h after b'fast: n/c - before lunch: n/c >> 108-187, 215 - 2h after lunch: n/c - before dinner: n/c  - 2h after dinner: n/c >> 86-152 - bedtime: 180-217 >> 125, 149, 205, 264 - nighttime: n/c + 1 low: 27 x1 in 01/2016, more recently 114 >> 86; she has hypoglycemia awareness at 90S.  Highest sugar was 200s >> 264  Glucometer: Accu-Chek Aviva  Pt's meals are: - Breakfast: eggs + sausage and bacon - Lunch: can of soup + turkey sandwich - Dinner: canned green beans, baked potatoes, pork chops or turkey sandwich - Snacks: sandwich at bedtime (!)  - + CKD stage 3-4, last BUN/creatinine:  Lab Results  Component Value Date   BUN 27 (H) 07/18/2016   BUN 17 01/26/2016   CREATININE 1.51 (H) 07/18/2016   CREATININE 1.30 (H) 01/26/2016   07/18/2016: GFR 35.37  Lab Results  Component Value Date   GFRNONAA 38 (L) 01/26/2016   GFRNONAA 37 (L) 01/25/2016   GFRNONAA 32 (L) 11/07/2015   Lab Results  Component Value Date   MICRALBCREAT 1.0 05/11/2015   MICRALBCREAT 1.1 07/29/2014   On lisinopril 40 mg daily. - last set of lipids: Lab Results  Component Value Date   CHOL 150 05/11/2015   HDL 43.80 05/11/2015   LDLCALC 71  05/11/2015   TRIG 174.0 (H) 05/11/2015   CHOLHDL 3 05/11/2015  On atorvastatin 10 mg daily. - last eye exam was in 03/2016: No DR. - She denies numbness and tingling in her feet.  She also has a history of hypothyroidism, on levothyroxine 62.5 g daily, managed by PCP.   Reviewed them recent TSH, which was normal: Lab Results  Component Value Date   TSH 1.16 07/18/2016   She also has HTN, GERD.  ROS: Constitutional: no weight gain/no weight loss, no fatigue, no subjective hyperthermia, no subjective hypothermia, + nocturia Eyes: no blurry vision, no xerophthalmia ENT: no sore throat, no nodules palpated in throat, no dysphagia, no odynophagia, no hoarseness Cardiovascular: no CP/no SOB/no palpitations/no leg swelling Respiratory: no cough/no SOB/no wheezing Gastrointestinal: no N/no V/no D/no C/no acid reflux Musculoskeletal: + muscle aches (back)/no joint aches Skin: no rashes, no hair loss Neurological: no tremors/no numbness/no tingling/no dizziness  I reviewed pt's medications, allergies, PMH, social hx, family hx, and changes were documented in the history of present illness. Otherwise, unchanged from my initial visit note.  Past Medical History:  Diagnosis Date  . Arthritis   . Diabetes mellitus without complication (HCC)   . History of chicken pox   . Hyperlipidemia   . Hypertension   . Thyroid disease    hypothyroid   Past Surgical History:    Procedure Laterality Date  . ABDOMINAL HYSTERECTOMY    . HIP ARTHROPLASTY Right 01/26/2016   Procedure: ARTHROPLASTY BIPOLAR HIP (HEMIARTHROPLASTY);  Surgeon: Timothy D Murphy, MD;  Location: [facility] OR;  Service: Orthopedics;  Laterality: Right;  . KNEE ARTHROSCOPY Bilateral   . REPLACEMENT TOTAL KNEE BILATERAL     1998 left , 1992 right   Social History   Social History Main Topics  . Smoking status: Never Smoker  . Smokeless tobacco: Current User    Types: Snuff  . Alcohol use 0.6 oz/week    1 Standard drinks or  equivalent per week     Comment: occasional beer, wine  . Drug use: No   Social History Narrative   Retired,  homemaker   Widow   Completed 7th grade   2 daughter   Son and daughter. Daughter local, son in [place]   4 grandchildren   Enjoys puzzles, gardening.      Current Outpatient Prescriptions on File Prior to Visit  Medication Sig Dispense Refill  . amLODipine (NORVASC) 5 MG tablet Take 1 tablet (5 mg total) by mouth daily. 90 tablet 3  . aspirin EC 81 MG tablet Take 81 mg by mouth daily.    . atorvastatin (LIPITOR) 10 MG tablet TAKE ONE TABLET BY MOUTH AT BEDTIME 90 tablet 1  . B-D UF III MINI PEN NEEDLES 31G X 5 MM MISC USE DAILY WITH LANTUS SOLOSTAR 100 each 1  . Blood Glucose Monitoring Suppl (ACCU-CHEK AVIVA PLUS) w/Device KIT Use to check blood sugar once a day.  DX E11.9 1 kit 0  . buPROPion (WELLBUTRIN) 100 MG tablet TAKE ONE TABLET BY MOUTH TWO TIMES A DAY 60 tablet 1  . carvedilol (COREG) 12.5 MG tablet TAKE 1 TABLET BY MOUTH TWO TIMES A DAY WITH MEAL 180 tablet 1  . glucose blood (ACCU-CHEK AVIVA PLUS) test strip TEST TWO TIMES A DAY.  Dx E11.8 200 each 0  . hydrOXYzine (ATARAX/VISTARIL) 25 MG tablet Take 1 tablet (25 mg total) by mouth 2 (two) times daily as needed. 60 tablet 5  . Lancet Devices (ACCU-CHEK SOFTCLIX) lancets Use to check blood sugar twice a day.  DX E11.9 100 each 5  . LANTUS SOLOSTAR 100 UNIT/ML Solostar Pen INJECT 14 UNITS INTO THE SKIN BEFORE LUNCH 15 mL 1  . levothyroxine (SYNTHROID, LEVOTHROID) 125 MCG tablet 1/2 tablet by mouth once daily 45 tablet 1  . linagliptin (TRADJENTA) 5 MG TABS tablet Take 1 tablet (5 mg total) by mouth daily. 30 tablet 11  . lisinopril (PRINIVIL,ZESTRIL) 40 MG tablet TAKE ONE TABLET BY MOUTH DAILY 90 tablet 1  . tiZANidine (ZANAFLEX) 4 MG tablet Take 1 tablet (4 mg total) by mouth every 6 (six) hours as needed for muscle spasms. 30 tablet 1   No current facility-administered medications on file prior to visit.    No Known  Allergies Family History  Problem Relation Age of Onset  . Arthritis Mother   . Depression Mother   . Heart attack Father        57   . Hypertension Father    PE: BP 128/72 (BP Location: Left Arm, Patient Position: Sitting)   Pulse 74   Wt 143 lb (64.9 kg)   SpO2 96%   BMI 21.74 kg/m   Wt Readings from Last 3 Encounters:  08/10/16 143 lb (64.9 kg)  07/18/16 144 lb 9.6 oz (65.6 kg)  06/14/16 143 lb 6.4 oz (65 kg)   Constitutional: normal weight, in NAD   Eyes: PERRLA, EOMI, no exophthalmos ENT: moist mucous membranes, no thyromegaly, no cervical lymphadenopathy Cardiovascular: RRR, No MRG Respiratory: CTA B Gastrointestinal: abdomen soft, NT, ND, BS+ Musculoskeletal: no deformities, strength intact in all 4 Skin: moist, warm, no rashes Neurological: no tremor with outstretched hands, DTR normal in all 4  ASSESSMENT: 1. DM2, insulin-dependent, uncontrolled, with complications - CKD stage 3 >> worse lately - h/o hypoglycemia  2. CKD stage 3b  PLAN:  1. Patient with long-standing, uncontrolled diabetes, on long-acting insulin and now Tradjenta, added at last visit. She has a history of severe hypoglycemia, of which we have to be very mindful for her. At this visit, her sugars have improved, with exception of few spikes in the hyperglycemic range related to her diet. We will not change the regimen for now, since there is not a consistent trend in her sugars and most are still at goal. - Discussed about reducing sweets (she may have candy bars at bedtime) - I suggested to:  Patient Instructions  Please continue: - Lantus 14 units in am  - Tradjenta 5 mg daily before b'fast  Please come back for a follow-up appointment in 3 months with your sugar log.  - today, HbA1c is 7.6% (better) - continue checking sugars at different times of the day - check 1-2x a day, rotating checks - advised for yearly eye exams >> she is UTD - Return to clinic in 3 mo with sugar log   2. CKD  stage 3b 2/2 DM - Her kidney function deteriorated recently but I discussed with the patient and her daughter that this does not contraindicate Tradjenta, and this medication does not need to be renally adjusted.    , MD PhD [facility]   

## 2016-08-10 NOTE — Patient Instructions (Addendum)
Please continue: - Lantus 14 units in am  - Tradjenta 5 mg daily before b'fast  Please come back for a follow-up appointment in 3 months with your sugar log.

## 2016-08-11 LAB — POCT GLYCOSYLATED HEMOGLOBIN (HGB A1C): Hemoglobin A1C: 7.6

## 2016-08-11 NOTE — Addendum Note (Signed)
Addended by: Darene LamerHOMPSON, Bexleigh Theriault T on: 08/11/2016 02:18 PM   Modules accepted: Orders

## 2016-08-25 ENCOUNTER — Other Ambulatory Visit: Payer: Self-pay | Admitting: Family

## 2016-09-03 ENCOUNTER — Other Ambulatory Visit: Payer: Self-pay | Admitting: Family

## 2016-09-20 MED ORDER — GLUCOSE BLOOD VI STRP: ORAL_STRIP | 1 refills | Status: DC

## 2016-09-20 NOTE — Addendum Note (Signed)
Addended by: Mervin Kung A on: 09/20/2016 03:21 PM   Modules accepted: Orders

## 2016-09-20 NOTE — Telephone Encounter (Signed)
Received fax from pharmacy requesting rx for accu chek aviva test strip be re-sent with dx code. Rx sent.

## 2016-10-06 ENCOUNTER — Other Ambulatory Visit: Payer: Self-pay | Admitting: Family

## 2016-10-14 ENCOUNTER — Other Ambulatory Visit: Payer: Self-pay | Admitting: Family

## 2016-10-14 NOTE — Telephone Encounter (Signed)
On 10.05.2018 #[id number] was faxed/thx dmf

## 2016-11-07 ENCOUNTER — Encounter: Payer: Self-pay | Admitting: Family

## 2016-11-07 ENCOUNTER — Ambulatory Visit (INDEPENDENT_AMBULATORY_CARE_PROVIDER_SITE_OTHER): Payer: Medicare Other | Admitting: Family

## 2016-11-07 VITALS — BP 161/74 | HR 77 | Temp 98.4°F | Resp 16 | Ht 68.0 in | Wt 146.8 lb

## 2016-11-07 DIAGNOSIS — Z23 Encounter for immunization: Secondary | ICD-10-CM

## 2016-11-07 DIAGNOSIS — I10 Essential (primary) hypertension: Secondary | ICD-10-CM

## 2016-11-07 DIAGNOSIS — E785 Hyperlipidemia, unspecified: Secondary | ICD-10-CM

## 2016-11-07 DIAGNOSIS — E1122 Type 2 diabetes mellitus with diabetic chronic kidney disease: Secondary | ICD-10-CM

## 2016-11-07 DIAGNOSIS — N183 Chronic kidney disease, stage 3 (moderate): Secondary | ICD-10-CM

## 2016-11-07 DIAGNOSIS — E039 Hypothyroidism, unspecified: Secondary | ICD-10-CM | POA: Diagnosis not present

## 2016-11-07 DIAGNOSIS — Z794 Long term (current) use of insulin: Secondary | ICD-10-CM

## 2016-11-07 MED ORDER — ZOSTER VAC RECOMB ADJUVANTED 50 MCG/0.5ML IM SUSR: INTRAMUSCULAR | 1 refills | Status: DC

## 2016-11-07 MED ORDER — BUPROPION HCL 100 MG PO TABS: 100.0000 mg | ORAL_TABLET | Freq: Two times a day (BID) | ORAL | 1 refills | Status: DC

## 2016-11-07 NOTE — Assessment & Plan Note (Signed)
Stable, management per endocrinology.  

## 2016-11-07 NOTE — Progress Notes (Signed)
Subjective:    Patient ID: Jessica Meyer, female    DOB: 07-12-1935, 81 y.o.   MRN: [id number]  HPI  Pt is a 81 yr old female who presents today for follow up.  1) HTN- maintained on lisinopril 74m, coreg 12.521m amlodipine 20m20m BP Readings from Last 3 Encounters:  11/07/16 (!) 161/74  08/10/16 128/72  08/02/16 136/61   2) Hypothyroid- maintained on synthroid 125 mcg.  Lab Results  Component Value Date   TSH 1.16 07/18/2016   3) Depression/Anxiety-  Maintained on wellbutrin. Uses atarax prn.    4) DM2- maintained on lantus.  Reports sugars ranging about 120-200.  Was better on tradjenta but she is in the donut hole and had to stop.  Lab Results  Component Value Date   HGBA1C 7.6 08/11/2016   HGBA1C 8.6 05/09/2016   HGBA1C 8.6 (H) 01/25/2016   Lab Results  Component Value Date   MICROALBUR <0.7 05/11/2015   LDLCALC 71 05/11/2015   CREATININE 1.51 (H) 07/18/2016   5) Hyperlipidemia- maintained on lipitor.  Denies myalgia.  Lab Results  Component Value Date   CHOL 150 05/11/2015   HDL 43.80 05/11/2015   LDLCALC 71 05/11/2015   TRIG 174.0 (H) 05/11/2015   CHOLHDL 3 05/11/2015      Review of Systems See HPI  Past Medical History:  Diagnosis Date  . Arthritis   . [place] ([place] . History of chicken pox   . Hyperlipidemia   . Hypertension   . Thyroid disease    hypothyroid     Social History   Socioeconomic History  . Marital status: Single    Spouse name: Not on file  . Number of children: Not on file  . Years of education: Not on file  . Highest education level: Not on file  Social Needs  . Financial resource strain: Not on file  . Food insecurity - worry: Not on file  . Food insecurity - inability: Not on file  . Transportation needs - medical: Not on file  . Transportation needs - non-medical: Not on file  Occupational History  . Not on file  Tobacco Use  . Smoking status: Never Smoker  . Smokeless  tobacco: Current User    Types: Snuff  Substance and Sexual Activity  . Alcohol use: Yes    Alcohol/week: 0.6 oz    Types: 1 Standard drinks or equivalent per week    Comment: occasional beer  . Drug use: No  . Sexual activity: Not on file  Other Topics Concern  . Not on file  Social History Narrative   Retired homemaker   Widow   Completed 7th grade   2 daughter   SonPandora Leiterd daughter. Daughter local, son in [place] grandchildren   Enjoys puzzles, gardening.    Past Surgical History:  Procedure Laterality Date  . ABDOMINAL HYSTERECTOMY    . KNEE ARTHROSCOPY Bilateral   . REPLACEMENT TOTAL KNEE BILATERAL     1998 left , 1992 right    Family History  Problem Relation Age of Onset  . Arthritis Mother   . Depression Mother   . Heart attack Father        57 40. Hypertension Father     No Known Allergies  Current Outpatient Medications on File Prior to Visit  Medication Sig Dispense Refill  . amLODipine (NORVASC) 5 MG tablet Take 1 tablet (5 mg total) by mouth daily. [place]  tablet 3  . aspirin EC 81 MG tablet Take 81 mg by mouth daily.    Marland Kitchen atorvastatin (LIPITOR) 10 MG tablet TAKE ONE TABLET BY MOUTH AT BEDTIME 90 tablet 1  . B-D UF III MINI PEN NEEDLES 31G X 5 MM MISC USE DAILY WITH LANTUS SOLOSTAR 100 each 1  . Blood Glucose Monitoring Suppl (ACCU-CHEK AVIVA PLUS) w/Device KIT Use to check blood sugar once a day.  DX E11.9 1 kit 0  . buPROPion (WELLBUTRIN) 100 MG tablet TAKE 1 TABLET BY MOUTH TWO TIMES A DAY 60 tablet 5  . carvedilol (COREG) 12.5 MG tablet TAKE 1 TABLET BY MOUTH TWO TIMES A DAY WITH MEAL 180 tablet 1  . glucose blood (ACCU-CHEK AVIVA PLUS) test strip USE TO TEST BLOOD SUGAR TWO TIMES A DAY  Dx. E11.22, N18.3, Z79.4 200 each 1  . hydrOXYzine (ATARAX/VISTARIL) 25 MG tablet TAKE ONE TABLET BY MOUTH TWICE A DAY AS NEEDED 60 tablet 5  . Lancet Devices (ACCU-CHEK SOFTCLIX) lancets Use to check blood sugar twice a day.  DX E11.9 100 each 5  . LANTUS SOLOSTAR 100  UNIT/ML Solostar Pen INJECT 14 UNITS INTO THE SKIN BEFORE LUNCH 15 mL 1  . levothyroxine (SYNTHROID, LEVOTHROID) 125 MCG tablet 1/2 tablet by mouth once daily 45 tablet 1  . lisinopril (PRINIVIL,ZESTRIL) 40 MG tablet TAKE ONE TABLET BY MOUTH DAILY 90 tablet 0   No current facility-administered medications on file prior to visit.     BP (!) 161/74 (BP Location: Right Arm, Cuff Size: Normal)   Pulse 77   Temp 98.4 F (36.9 C) (Oral)   Resp 16   Ht 5' 8"  (1.727 m)   Wt 146 lb 12.8 oz (66.6 kg)   SpO2 98%   BMI 22.32 kg/m       Objective:   Physical Exam  Constitutional: She is oriented to person, place, and time. She appears well-developed and well-nourished.  HENT:  Head: Normocephalic and atraumatic.  Cardiovascular: Normal rate, regular rhythm and normal heart sounds.  No murmur heard. Pulmonary/Chest: Effort normal and breath sounds normal. No respiratory distress. She has no wheezes.  Musculoskeletal: She exhibits no edema.  Neurological: She is alert and oriented to person, place, and time.  Skin: Skin is warm and dry.  Psychiatric: She has a normal mood and affect. Her behavior is normal. Judgment and thought content normal.          Assessment & Plan:  Flu shot today.

## 2016-11-07 NOTE — Assessment & Plan Note (Signed)
Stable on statin.  Obtain lipid panel.   

## 2016-11-07 NOTE — Assessment & Plan Note (Signed)
BP elevated today. Daughter will recheck BP tomorrow at home. Has been much better last few visits. If still elevated would consider increasing amlodipine dose from 5mg  to 10mg .

## 2016-11-07 NOTE — Patient Instructions (Signed)
Please complete lab work prior to leaving.  Check blood pressure at home tomorrow and send me your reading via mychart.

## 2016-11-07 NOTE — Assessment & Plan Note (Signed)
Clinically stable on current dose of synthroid. Obtain follow up TSH.  °

## 2016-11-08 ENCOUNTER — Encounter: Payer: Self-pay | Admitting: Family

## 2016-11-08 LAB — COMPREHENSIVE METABOLIC PANEL
ALK PHOS: 78 U/L (ref 39–117)
ALT: 14 U/L (ref 0–35)
AST: 19 U/L (ref 0–37)
Albumin: 4.4 g/dL (ref 3.5–5.2)
BUN: 28 mg/dL — AB (ref 6–23)
CO2: 28 mEq/L (ref 19–32)
CREATININE: 1.4 mg/dL — AB (ref 0.40–1.20)
Calcium: 10.4 mg/dL (ref 8.4–10.5)
Chloride: 104 mEq/L (ref 96–112)
GFR: 38.56 mL/min — ABNORMAL LOW (ref >60.00)
GLUCOSE: 81 mg/dL (ref 70–99)
POTASSIUM: 4.4 meq/L (ref 3.5–5.1)
SODIUM: 139 meq/L (ref 135–145)
TOTAL PROTEIN: 7.4 g/dL (ref 6.0–8.3)
Total Bilirubin: 0.5 mg/dL (ref 0.2–1.2)

## 2016-11-08 LAB — LIPID PANEL
CHOLESTEROL: 170 mg/dL (ref 0–200)
HDL: 50.8 mg/dL (ref >39.00)
LDL Cholesterol: 98 mg/dL (ref 0–99)
NonHDL: 119.1
Total CHOL/HDL Ratio: 3
Triglycerides: 108 mg/dL (ref 0.0–149.0)
VLDL: 21.6 mg/dL (ref 0.0–40.0)

## 2016-11-10 ENCOUNTER — Ambulatory Visit (INDEPENDENT_AMBULATORY_CARE_PROVIDER_SITE_OTHER): Payer: Medicare Other | Admitting: Internal Medicine

## 2016-11-10 ENCOUNTER — Encounter: Payer: Self-pay | Admitting: Internal Medicine

## 2016-11-10 VITALS — BP 130/70 | HR 68 | Wt 147.0 lb

## 2016-11-10 DIAGNOSIS — Z794 Long term (current) use of insulin: Secondary | ICD-10-CM

## 2016-11-10 DIAGNOSIS — N183 Chronic kidney disease, stage 3 (moderate): Secondary | ICD-10-CM

## 2016-11-10 DIAGNOSIS — E7800 Pure hypercholesterolemia, unspecified: Secondary | ICD-10-CM

## 2016-11-10 DIAGNOSIS — E1122 Type 2 diabetes mellitus with diabetic chronic kidney disease: Secondary | ICD-10-CM

## 2016-11-10 LAB — POCT GLYCOSYLATED HEMOGLOBIN (HGB A1C): HEMOGLOBIN A1C: 8.2

## 2016-11-10 MED ORDER — LINAGLIPTIN 5 MG PO TABS: 5.0000 mg | ORAL_TABLET | Freq: Every day | ORAL | 11 refills | Status: DC

## 2016-11-10 NOTE — Patient Instructions (Addendum)
Please continue: - Lantus 14 units in am (we may need to increase to 16 units if you cannot start Tradjenta)  Try to restart: - Tradjenta 5 mg daily before b'fast  Please come back for a follow-up appointment in 3-4 months with your sugar log.

## 2016-11-10 NOTE — Progress Notes (Signed)
Patient ID: Jessica Meyer, female   DOB: Apr 25, 1935, 81 y.o.   MRN: [id number]   HPI: Jessica Meyer is a 81 y.o.-year-old female, returning for returning for follow-up for DM2, dx in 1990s, insulin-dependent since ~2012, uncontrolled, with complications (CKD stage 3-4). Last visit 3 months ago.  She is here with her daughter who offers most of the hx, especially regarding her medication doses, med hx, and CBGs.  Last hemoglobin A1c was: Lab Results  Component Value Date   HGBA1C 7.6 08/11/2016   HGBA1C 8.6 05/09/2016   HGBA1C 8.6 (H) 01/25/2016   Pt is on a regimen of: - Lantus 14 units at bedtime >> in am She was on Tradjenta 5 mg daily before breakfast - added 05/2016 >> stopped b/c doughnut hole, in 09/2016. She was on Glipizide >> hypoglycemia.  Pt checks her sugars 2x a day >> better: - am: 117-140 >> 109-167 >> 88-140 - 2h after b'fast: n/c - before lunch: n/c >> 108-187, 215 >> 145-150 - 2h after lunch: n/c - before dinner: n/c  - 2h after dinner: n/c >> 86-152  - bedtime: 180-217 >> 125, 149, 205, 264 >> 160-187 - nighttime: n/c Lows: 27 x1 in 01/2016, more recently 114 >> 86 >> 88; she has hypoglycemia awareness at 90s.  Highest sugar was 200s >> 264 >> 187.  Glucometer: Accu-Chek Aviva  Pt's meals are: - Breakfast: eggs + sausage and bacon - Lunch: can of soup + [place] sandwich - Dinner: canned green beans, baked potatoes, pork chops or [place] sandwich - Snacks: sandwich at bedtime (!)  - + CKD stage 3-4, last BUN/creatinine:  Lab Results  Component Value Date   BUN 28 (H) 11/07/2016   BUN 27 (H) 07/18/2016   CREATININE 1.40 (H) 11/07/2016   CREATININE 1.51 (H) 07/18/2016   07/18/2016: GFR 35.37  Lab Results  Component Value Date   GFRNONAA 38 (L) 01/26/2016   GFRNONAA 37 (L) 01/25/2016   GFRNONAA 32 (L) 11/07/2015   Lab Results  Component Value Date   MICRALBCREAT 1.0 05/11/2015   MICRALBCREAT 1.1 07/29/2014  On Lisinopril 10. - last set of  lipids: Lab Results  Component Value Date   CHOL 170 11/07/2016   HDL 50.80 11/07/2016   LDLCALC 98 11/07/2016   TRIG 108.0 11/07/2016   CHOLHDL 3 11/07/2016  On Lipitor 10. - last eye exam was in 03/2016 >> No DR - no numbness and tingling in her feet.  She also has a history of hypothyroidism, on levothyroxine, managed by PCP.  Reviewed most recent TSH >> normal: Lab Results  Component Value Date   TSH 1.16 07/18/2016   She also has HTN, GERD.  ROS: Constitutional: no weight gain/no weight loss, no fatigue, no subjective hyperthermia, no subjective hypothermia, + nocturia Eyes: no blurry vision, no xerophthalmia ENT: no sore throat, no nodules palpated in throat, no dysphagia, no odynophagia, no hoarseness Cardiovascular: no CP/no SOB/no palpitations/no leg swelling Respiratory: no cough/no SOB/no wheezing Gastrointestinal: no N/no V/no D/no C/no acid reflux Musculoskeletal: + muscle aches/+ joint aches Skin: no rashes, no hair loss Neurological: no tremors/no numbness/no tingling/no dizziness  I reviewed pt's medications, allergies, PMH, social hx, family hx, and changes were documented in the history of present illness. Otherwise, unchanged from my initial visit note.  Past Medical History:  Diagnosis Date  . Arthritis   . Diabetes mellitus without complication ([place])   . History of chicken pox   . Hyperlipidemia   . Hypertension   .  Thyroid disease    hypothyroid   Past Surgical History:  Procedure Laterality Date  . ABDOMINAL HYSTERECTOMY    . KNEE ARTHROSCOPY Bilateral   . REPLACEMENT TOTAL KNEE BILATERAL     1998 left , 1992 right   Social History   Social History Main Topics  . Smoking status: Never Smoker  . Smokeless tobacco: Current User    Types: Snuff  . Alcohol use 0.6 oz/week    1 Standard drinks or equivalent per week     Comment: occasional beer, wine  . Drug use: No   Social History Narrative   Retired,  homemaker   Widow   Completed  7th grade   2 daughter   Son and daughter. Daughter local, son in [place]   4 grandchildren   Enjoys puzzles, gardening.      Current Outpatient Medications on File Prior to Visit  Medication Sig Dispense Refill  . amLODipine (NORVASC) 5 MG tablet Take 1 tablet (5 mg total) by mouth daily. 90 tablet 3  . aspirin EC 81 MG tablet Take 81 mg by mouth daily.    Marland Kitchen atorvastatin (LIPITOR) 10 MG tablet TAKE ONE TABLET BY MOUTH AT BEDTIME 90 tablet 1  . B-D UF III MINI PEN NEEDLES 31G X 5 MM MISC USE DAILY WITH LANTUS SOLOSTAR 100 each 1  . Blood Glucose Monitoring Suppl (ACCU-CHEK AVIVA PLUS) w/Device KIT Use to check blood sugar once a day.  DX E11.9 1 kit 0  . buPROPion (WELLBUTRIN) 100 MG tablet Take 1 tablet (100 mg total) 2 (two) times daily by mouth. 180 tablet 1  . carvedilol (COREG) 12.5 MG tablet TAKE 1 TABLET BY MOUTH TWO TIMES A DAY WITH MEAL 180 tablet 1  . glucose blood (ACCU-CHEK AVIVA PLUS) test strip USE TO TEST BLOOD SUGAR TWO TIMES A DAY  Dx. E11.22, N18.3, Z79.4 200 each 1  . hydrOXYzine (ATARAX/VISTARIL) 25 MG tablet TAKE ONE TABLET BY MOUTH TWICE A DAY AS NEEDED 60 tablet 5  . Lancet Devices (ACCU-CHEK SOFTCLIX) lancets Use to check blood sugar twice a day.  DX E11.9 100 each 5  . LANTUS SOLOSTAR 100 UNIT/ML Solostar Pen INJECT 14 UNITS INTO THE SKIN BEFORE LUNCH 15 mL 1  . levothyroxine (SYNTHROID, LEVOTHROID) 125 MCG tablet 1/2 tablet by mouth once daily 45 tablet 1  . lisinopril (PRINIVIL,ZESTRIL) 40 MG tablet TAKE ONE TABLET BY MOUTH DAILY 90 tablet 0  . Zoster Vaccine Adjuvanted [facility]) injection Inject 0.5 IM now and again in 2-6 months. 0.5 mL 1   No current facility-administered medications on file prior to visit.    No Known Allergies Family History  Problem Relation Age of Onset  . Arthritis Mother   . Depression Mother   . Heart attack Father        25   . Hypertension Father    PE: There were no vitals taken for this visit. Wt Readings from Last 3  Encounters:  11/07/16 146 lb 12.8 oz (66.6 kg)  08/10/16 143 lb (64.9 kg)  07/18/16 144 lb 9.6 oz (65.6 kg)   Constitutional: normal weight, in NAD Eyes: PERRLA, EOMI, no exophthalmos ENT: moist mucous membranes, no thyromegaly, no cervical lymphadenopathy Cardiovascular: RRR, No MRG Respiratory: CTA B Gastrointestinal: abdomen soft, NT, ND, BS+ Musculoskeletal: no deformities, strength intact in all 4 Skin: moist, warm, no rashes Neurological: no tremor with outstretched hands, DTR normal in all 4  ASSESSMENT: 1. DM2, insulin-dependent, uncontrolled, with complications - CKD stage 3 >>  worse lately - h/o hypoglycemia  2. HL  PLAN:  1. Patient with long-standing, uncontrolled DM, on long acting insulin and Tradjenta. She has a h/o severe hypoglycemia, so we have to be very careful to avoid hypoglycemia. -2 months ago she had to stop Tradjenta as she was in the donut hole and could not afford it anymore.  Her sugars did not worsen and afterwards, actually, they appear to be better per her and her daughter's recall (no meter, No log).  However, they still increase during the day in a stepwise manner, consistent with insufficient mealtime coverage.  She thinks that she may be out of the donut hole for now, so I advised her to refill the Tradjenta if she can.  If not, we may need to increase her Lantus by 2 units in the morning. - I suggested to:  Patient Instructions  Please continue: - Lantus 14 units in am (we may need to increase to 16 units if you cannot start Tradjenta)  Try to restart: - Tradjenta 5 mg daily before b'fast  Please come back for a follow-up appointment in 3-4 months with your sugar log.  - today, HbA1c is 8.3% (higher, unexplained, not correlating well with sugars at home >> will check a fructosamine) - continue checking sugars at different times of the day - check 1-2x a day, rotating checks - advised for yearly eye exams >> she is UTD - also UTD with flu  shot - Return to clinic in 3 mo with sugar log   2. HL - Lipids great - reviewed per last visit  - On Lipitor  Office Visit on 11/10/2016  Component Date Value Ref Range Status  . Fructosamine 11/10/2016 334* 190 - 270 umol/L Final  . Hemoglobin A1C 11/10/2016 8.2   Final  HbA1c calculated from the fructosamine is 7.3%, lower than the one measured.   Philemon Kingdom, MD PhD [facility]

## 2016-11-14 LAB — FRUCTOSAMINE: FRUCTOSAMINE: 334 umol/L — AB (ref 190–270)

## 2016-11-28 ENCOUNTER — Encounter: Payer: Self-pay | Admitting: Family

## 2016-11-29 ENCOUNTER — Encounter: Payer: Self-pay | Admitting: Family Medicine

## 2016-11-29 ENCOUNTER — Ambulatory Visit: Payer: Medicare Other | Admitting: Family Medicine

## 2016-11-29 ENCOUNTER — Ambulatory Visit (INDEPENDENT_AMBULATORY_CARE_PROVIDER_SITE_OTHER): Payer: Medicare Other | Admitting: Family Medicine

## 2016-11-29 VITALS — BP 138/74 | Temp 98.1°F | Ht 68.0 in | Wt 145.0 lb

## 2016-11-29 DIAGNOSIS — L03119 Cellulitis of unspecified part of limb: Secondary | ICD-10-CM | POA: Diagnosis not present

## 2016-11-29 DIAGNOSIS — L02519 Cutaneous abscess of unspecified hand: Secondary | ICD-10-CM

## 2016-11-29 MED ORDER — CEFTRIAXONE SODIUM 1 G IJ SOLR
1.0000 g | Freq: Once | INTRAMUSCULAR | Status: AC
  Administered 2016-11-29: 1 g via INTRAMUSCULAR

## 2016-11-29 MED ORDER — CEPHALEXIN 500 MG PO CAPS: 500.0000 mg | ORAL_CAPSULE | Freq: Two times a day (BID) | ORAL | 0 refills | Status: DC

## 2016-11-29 MED FILL — CEPHALEXIN 500 MG CAPSULE: 500 | 10 days supply | Qty: 20 | Fill #0

## 2016-11-29 NOTE — Progress Notes (Signed)
Subjective:  I acted as a Education administrator for Dr. Asa Lente, CMA   Patient ID: Jessica Meyer, female    DOB: [birth year], 81 y.o.   MRN: [id number]  Chief Complaint  Patient presents with  . Cellulitis    Onset about 3 weeks ago. Pt states it was just painful until last friday (4 days ago)     HPI  Patient is in today for cellulitis of left hand  X 2 days.  Etiology unknown. + hot, red and tender to touch     Past Medical History:  Diagnosis Date  . Arthritis   . Diabetes mellitus without complication ([place])   . History of chicken pox   . Hyperlipidemia   . Hypertension   . Thyroid disease    hypothyroid    Past Surgical History:  Procedure Laterality Date  . ABDOMINAL HYSTERECTOMY    . HIP ARTHROPLASTY Right 01/26/2016   Procedure: ARTHROPLASTY BIPOLAR HIP (HEMIARTHROPLASTY);  Surgeon: Renette Butters, MD;  Location: [facility];  Service: Orthopedics;  Laterality: Right;  . KNEE ARTHROSCOPY Bilateral   . REPLACEMENT TOTAL KNEE BILATERAL     1998 left , 1992 right    Family History  Problem Relation Age of Onset  . Arthritis Mother   . Depression Mother   . Heart attack Father        36   . Hypertension Father     Social History   Socioeconomic History  . Marital status: Single    Spouse name: Not on file  . Number of children: Not on file  . Years of education: Not on file  . Highest education level: Not on file  Social Needs  . Financial resource strain: Not on file  . Food insecurity - worry: Not on file  . Food insecurity - inability: Not on file  . Transportation needs - medical: Not on file  . Transportation needs - non-medical: Not on file  Occupational History  . Not on file  Tobacco Use  . Smoking status: Never Smoker  . Smokeless tobacco: Current User    Types: Snuff  Substance and Sexual Activity  . Alcohol use: Yes    Alcohol/week: 0.6 oz    Types: 1 Standard drinks or equivalent per week    Comment: occasional beer  . Drug use: No  . Sexual  activity: Not on file  Other Topics Concern  . Not on file  Social History Narrative   Retired homemaker   Widow   Completed 7th grade   2 daughter   [name] and daughter. Daughter local, son in [place]   4 grandchildren   Enjoys puzzles, gardening.    Outpatient Medications Prior to Visit  Medication Sig Dispense Refill  . amLODipine (NORVASC) 5 MG tablet Take 1 tablet (5 mg total) by mouth daily. 90 tablet 3  . aspirin EC 81 MG tablet Take 81 mg by mouth daily.    Marland Kitchen atorvastatin (LIPITOR) 10 MG tablet TAKE ONE TABLET BY MOUTH AT BEDTIME 90 tablet 1  . B-D UF III MINI PEN NEEDLES 31G X 5 MM MISC USE DAILY WITH LANTUS SOLOSTAR 100 each 1  . Blood Glucose Monitoring Suppl (ACCU-CHEK AVIVA PLUS) w/Device KIT Use to check blood sugar once a day.  DX E11.9 1 kit 0  . buPROPion (WELLBUTRIN) 100 MG tablet Take 1 tablet (100 mg total) 2 (two) times daily by mouth. 180 tablet 1  . carvedilol (COREG) 12.5 MG tablet TAKE 1 TABLET BY  MOUTH TWO TIMES A DAY WITH MEAL 180 tablet 1  . glucose blood (ACCU-CHEK AVIVA PLUS) test strip USE TO TEST BLOOD SUGAR TWO TIMES A DAY  Dx. E11.22, N18.3, Z79.4 200 each 1  . hydrOXYzine (ATARAX/VISTARIL) 25 MG tablet TAKE ONE TABLET BY MOUTH TWICE A DAY AS NEEDED 60 tablet 5  . Lancet Devices (ACCU-CHEK SOFTCLIX) lancets Use to check blood sugar twice a day.  DX E11.9 100 each 5  . LANTUS SOLOSTAR 100 UNIT/ML Solostar Pen INJECT 14 UNITS INTO THE SKIN BEFORE LUNCH 15 mL 1  . levothyroxine (SYNTHROID, LEVOTHROID) 125 MCG tablet 1/2 tablet by mouth once daily 45 tablet 1  . linagliptin (TRADJENTA) 5 MG TABS tablet Take 1 tablet (5 mg total) daily by mouth. 30 tablet 11  . lisinopril (PRINIVIL,ZESTRIL) 40 MG tablet TAKE ONE TABLET BY MOUTH DAILY 90 tablet 0  . Zoster Vaccine Adjuvanted [facility]) injection Inject 0.5 IM now and again in 2-6 months. 0.5 mL 1   No facility-administered medications prior to visit.     No Known Allergies  Review of Systems  Constitutional:  Negative for chills, fever and malaise/fatigue.  HENT: Negative for congestion and hearing loss.   Eyes: Negative for discharge.  Respiratory: Negative for cough, sputum production and shortness of breath.   Cardiovascular: Negative for chest pain, palpitations and leg swelling.  Gastrointestinal: Negative for abdominal pain, blood in stool, constipation, diarrhea, heartburn, nausea and vomiting.  Genitourinary: Negative for dysuria, frequency, hematuria and urgency.  Musculoskeletal: Negative for back pain, falls and myalgias.  Skin: Negative for rash.  Neurological: Negative for dizziness, sensory change, loss of consciousness, weakness and headaches.  Endo/Heme/Allergies: Negative for environmental allergies. Does not bruise/bleed easily.  Psychiatric/Behavioral: Negative for depression and suicidal ideas. The patient is not nervous/anxious and does not have insomnia.        Objective:    Physical Exam  Constitutional: She is oriented to person, place, and time. She appears well-developed and well-nourished.  HENT:  Head: Normocephalic and atraumatic.  Eyes: Conjunctivae and EOM are normal.  Neck: Normal range of motion. Neck supple. No JVD present. Carotid bruit is not present. No thyromegaly present.  Cardiovascular: Normal rate, regular rhythm and normal heart sounds.  Pulmonary/Chest: Effort normal and breath sounds normal.  Musculoskeletal: She exhibits no edema.  Neurological: She is alert and oriented to person, place, and time.  Skin: There is erythema.  Psychiatric: She has a normal mood and affect.  Nursing note and vitals reviewed.     BP 138/74   Temp 98.1 F (36.7 C) (Oral)   Ht 5' 8"  (1.727 m)   Wt 145 lb (65.8 kg)   BMI 22.05 kg/m  Wt Readings from Last 3 Encounters:  11/29/16 145 lb (65.8 kg)  11/10/16 147 lb (66.7 kg)  11/07/16 146 lb 12.8 oz (66.6 kg)   BP Readings from Last 3 Encounters:  11/29/16 138/74  11/10/16 130/70  11/07/16 (!) 161/74      Immunization History  Administered Date(s) Administered  . Influenza, High Dose Seasonal PF 10/13/2014, 09/14/2015, 11/07/2016  . Influenza-Unspecified 10/03/2013  . Pneumococcal Conjugate-13 10/03/2013  . Pneumococcal-Unspecified 01/03/2009  . Td 03/12/2007  . Tdap 01/04/2012  . Zoster 01/04/2011    Health Maintenance  Topic Date Due  . DEXA SCAN  12/09/2002  . OPHTHALMOLOGY EXAM  03/07/2017  . HEMOGLOBIN A1C  05/10/2017  . FOOT EXAM  07/18/2017  . TETANUS/TDAP  01/03/2022  . INFLUENZA VACCINE  Completed  . PNA vac  Low Risk Adult  Completed    Lab Results  Component Value Date   WBC 11.5 (H) 01/27/2016   HGB 10.5 (L) 01/27/2016   HCT 31.7 (L) 01/27/2016   PLT 160 01/27/2016   GLUCOSE 81 11/07/2016   CHOL 170 11/07/2016   TRIG 108.0 11/07/2016   HDL 50.80 11/07/2016   LDLCALC 98 11/07/2016   ALT 14 11/07/2016   AST 19 11/07/2016   NA 139 11/07/2016   K 4.4 11/07/2016   CL 104 11/07/2016   CREATININE 1.40 (H) 11/07/2016   BUN 28 (H) 11/07/2016   CO2 28 11/07/2016   TSH 1.16 07/18/2016   INR 1.12 01/26/2016   HGBA1C 8.2 11/10/2016   MICROALBUR <0.7 05/11/2015    Lab Results  Component Value Date   TSH 1.16 07/18/2016   Lab Results  Component Value Date   WBC 11.5 (H) 01/27/2016   HGB 10.5 (L) 01/27/2016   HCT 31.7 (L) 01/27/2016   MCV 91.1 01/27/2016   PLT 160 01/27/2016   Lab Results  Component Value Date   NA 139 11/07/2016   K 4.4 11/07/2016   CO2 28 11/07/2016   GLUCOSE 81 11/07/2016   BUN 28 (H) 11/07/2016   CREATININE 1.40 (H) 11/07/2016   BILITOT 0.5 11/07/2016   ALKPHOS 78 11/07/2016   AST 19 11/07/2016   ALT 14 11/07/2016   PROT 7.4 11/07/2016   ALBUMIN 4.4 11/07/2016   CALCIUM 10.4 11/07/2016   ANIONGAP 5 01/26/2016   GFR 38.56 (L) 11/07/2016   Lab Results  Component Value Date   CHOL 170 11/07/2016   Lab Results  Component Value Date   HDL 50.80 11/07/2016   Lab Results  Component Value Date   LDLCALC 98 11/07/2016    Lab Results  Component Value Date   TRIG 108.0 11/07/2016   Lab Results  Component Value Date   CHOLHDL 3 11/07/2016   Lab Results  Component Value Date   HGBA1C 8.2 11/10/2016         Assessment & Plan:   Problem List Items Addressed This Visit    None    Visit Diagnoses    Cellulitis and abscess of hand    -  Primary   Relevant Medications   cefTRIAXone (ROCEPHIN) injection 1 g (Completed)   cefTRIAXone (ROCEPHIN) injection 1 g (Completed)      I have discontinued Detta A. Devine's cephALEXin. I am also having her maintain her aspirin EC, ACCU-CHEK AVIVA PLUS, accu-chek softclix, LANTUS SOLOSTAR, amLODipine, levothyroxine, atorvastatin, carvedilol, B-D UF III MINI PEN NEEDLES, hydrOXYzine, lisinopril, glucose blood, buPROPion, Zoster Vaccine Adjuvanted, and linagliptin. We administered cefTRIAXone and cefTRIAXone.  Meds ordered this encounter  Medications  . DISCONTD: cephALEXin (KEFLEX) 500 MG capsule    Sig: Take 1 capsule (500 mg total) by mouth 2 (two) times daily.    Dispense:  20 capsule    Refill:  0  . cefTRIAXone (ROCEPHIN) injection 1 g  . cefTRIAXone (ROCEPHIN) injection 1 g   rto in 2-3 days or sooner prn CMA served as scribe during this visit. History, Physical and Plan performed by medical provider. Documentation and orders reviewed and attested to.  Ann Held, DO

## 2016-11-29 NOTE — Patient Instructions (Signed)

## 2016-12-02 ENCOUNTER — Emergency Department (HOSPITAL_BASED_OUTPATIENT_CLINIC_OR_DEPARTMENT_OTHER)
Admission: EM | Admit: 2016-12-02 | Discharge: 2016-12-02 | Disposition: A | Discharge status: Home / Self Care | Payer: Medicare Other | Attending: Emergency Medicine | Admitting: Emergency Medicine

## 2016-12-02 ENCOUNTER — Ambulatory Visit (INDEPENDENT_AMBULATORY_CARE_PROVIDER_SITE_OTHER): Payer: Medicare Other | Admitting: Medical

## 2016-12-02 ENCOUNTER — Telehealth: Payer: Self-pay | Admitting: Family

## 2016-12-02 ENCOUNTER — Encounter (HOSPITAL_BASED_OUTPATIENT_CLINIC_OR_DEPARTMENT_OTHER): Payer: Self-pay | Admitting: *Deleted

## 2016-12-02 ENCOUNTER — Encounter: Payer: Self-pay | Admitting: Medical

## 2016-12-02 ENCOUNTER — Emergency Department (HOSPITAL_BASED_OUTPATIENT_CLINIC_OR_DEPARTMENT_OTHER): Payer: Medicare Other

## 2016-12-02 ENCOUNTER — Other Ambulatory Visit: Payer: Self-pay

## 2016-12-02 VITALS — BP 124/57 | HR 71 | Temp 98.5°F | Resp 16 | Ht 68.0 in | Wt 144.8 lb

## 2016-12-02 DIAGNOSIS — L0291 Cutaneous abscess, unspecified: Secondary | ICD-10-CM | POA: Diagnosis not present

## 2016-12-02 DIAGNOSIS — Z79899 Other long term (current) drug therapy: Secondary | ICD-10-CM | POA: Diagnosis not present

## 2016-12-02 DIAGNOSIS — Z794 Long term (current) use of insulin: Secondary | ICD-10-CM | POA: Diagnosis not present

## 2016-12-02 DIAGNOSIS — L0390 Cellulitis, unspecified: Secondary | ICD-10-CM

## 2016-12-02 DIAGNOSIS — N183 Chronic kidney disease, stage 3 (moderate): Secondary | ICD-10-CM | POA: Insufficient documentation

## 2016-12-02 DIAGNOSIS — L02512 Cutaneous abscess of left hand: Secondary | ICD-10-CM | POA: Diagnosis not present

## 2016-12-02 DIAGNOSIS — Z7982 Long term (current) use of aspirin: Secondary | ICD-10-CM | POA: Diagnosis not present

## 2016-12-02 DIAGNOSIS — M7989 Other specified soft tissue disorders: Secondary | ICD-10-CM | POA: Diagnosis not present

## 2016-12-02 DIAGNOSIS — M79642 Pain in left hand: Secondary | ICD-10-CM | POA: Diagnosis not present

## 2016-12-02 DIAGNOSIS — I129 Hypertensive chronic kidney disease with stage 1 through stage 4 chronic kidney disease, or unspecified chronic kidney disease: Secondary | ICD-10-CM | POA: Insufficient documentation

## 2016-12-02 DIAGNOSIS — E1122 Type 2 diabetes mellitus with diabetic chronic kidney disease: Secondary | ICD-10-CM | POA: Insufficient documentation

## 2016-12-02 DIAGNOSIS — R600 Localized edema: Secondary | ICD-10-CM | POA: Diagnosis present

## 2016-12-02 LAB — BASIC METABOLIC PANEL
ANION GAP: 6 (ref 5–15)
BUN: 29 mg/dL — ABNORMAL HIGH (ref 6–20)
CALCIUM: 9.9 mg/dL (ref 8.9–10.3)
CO2: 27 mmol/L (ref 22–32)
Chloride: 101 mmol/L (ref 101–111)
Creatinine, Ser: 1.42 mg/dL — ABNORMAL HIGH (ref 0.44–1.00)
GFR, EST AFRICAN AMERICAN: 40 mL/min — AB (ref >60)
GFR, EST NON AFRICAN AMERICAN: 34 mL/min — AB (ref >60)
Glucose, Bld: 114 mg/dL — ABNORMAL HIGH (ref 65–99)
POTASSIUM: 4.2 mmol/L (ref 3.5–5.1)
SODIUM: 134 mmol/L — AB (ref 135–145)

## 2016-12-02 LAB — CBC WITH DIFFERENTIAL/PLATELET
BASOS ABS: 0 10*3/uL (ref 0.0–0.1)
BASOS PCT: 0 %
EOS ABS: 0.3 10*3/uL (ref 0.0–0.7)
EOS PCT: 3 %
HCT: 42.5 % (ref 36.0–46.0)
Hemoglobin: 14.4 g/dL (ref 12.0–15.0)
LYMPHS PCT: 21 %
Lymphs Abs: 2.1 10*3/uL (ref 0.7–4.0)
MCH: 30.1 pg (ref 26.0–34.0)
MCHC: 33.9 g/dL (ref 30.0–36.0)
MCV: 88.7 fL (ref 78.0–100.0)
Monocytes Absolute: 0.9 10*3/uL (ref 0.1–1.0)
Monocytes Relative: 8 %
Neutro Abs: 6.8 10*3/uL (ref 1.7–7.7)
Neutrophils Relative %: 68 %
Platelets: 228 10*3/uL (ref 150–400)
RBC: 4.79 MIL/uL (ref 3.87–5.11)
RDW: 12.4 % (ref 11.5–15.5)
WBC: 10.1 10*3/uL (ref 4.0–10.5)

## 2016-12-02 LAB — I-STAT CG4 LACTIC ACID, ED: LACTIC ACID, VENOUS: 0.41 mmol/L — AB (ref 0.5–1.9)

## 2016-12-02 MED ORDER — SULFAMETHOXAZOLE-TRIMETHOPRIM 800-160 MG PO TABS: 1.0000 | ORAL_TABLET | Freq: Two times a day (BID) | ORAL | 0 refills | Status: AC

## 2016-12-02 MED ORDER — LIDOCAINE HCL 2 % IJ SOLN
10.0000 mL | Freq: Once | INTRAMUSCULAR | Status: DC
  Filled 2016-12-02: qty 20

## 2016-12-02 NOTE — ED Triage Notes (Signed)
Abscess to her left thumb. Red, swollen, painful and decreased movement. She has been getting Rocephin injections over the past week with no improvement.

## 2016-12-02 NOTE — Progress Notes (Signed)
Subjective:    Patient ID: Jessica Meyer, female    DOB: 04/15/1935, 81 y.o.   MRN: [id number]  HPI  Pt in for follow up.  Pt seen by Dr. Etter Meyer earlier in week. Pt was given keflex early on twice a day on Tuesday afternoon and was given rocephin 1 gram.  Pt and daughter states some pain and swollen Friday after thanksgiving.   The area is draining some.  There he has gotten quite a bit larger than previously.  Has expanded down her hand now.  No fever, no chills or sweats.    Review of Systems  Constitutional: Negative for chills, fatigue and fever.  Respiratory: Negative for cough, chest tightness, shortness of breath and wheezing.   Cardiovascular: Negative for chest pain and palpitations.  Gastrointestinal: Negative for abdominal pain, constipation, nausea and vomiting.  Musculoskeletal: Negative for back pain.       Left hand-thumb redness and tenderness with some dc.  Skin: Negative for rash.  Psychiatric/Behavioral: Negative for behavioral problems and confusion. The patient is not nervous/anxious.    Past Medical History:  Diagnosis Date  . Arthritis   . Diabetes mellitus without complication ([place])   . History of chicken pox   . Hyperlipidemia   . Hypertension   . Thyroid disease    hypothyroid     Social History   Socioeconomic History  . Marital status: Single    Spouse name: Not on file  . Number of children: Not on file  . Years of education: Not on file  . Highest education level: Not on file  Social Needs  . Financial resource strain: Not on file  . Food insecurity - worry: Not on file  . Food insecurity - inability: Not on file  . Transportation needs - medical: Not on file  . Transportation needs - non-medical: Not on file  Occupational History  . Not on file  Tobacco Use  . Smoking status: Never Smoker  . Smokeless tobacco: Current User    Types: Snuff  Substance and Sexual Activity  . Alcohol use: Yes    Alcohol/week: 0.6 oz    Types:  1 Standard drinks or equivalent per week    Comment: occasional beer  . Drug use: No  . Sexual activity: Not on file  Other Topics Concern  . Not on file  Social History Narrative   Retired homemaker   Widow   Completed 7th grade   2 daughter   [name] and daughter. Daughter local, son in [place]   4 grandchildren   Enjoys puzzles, gardening.    Past Surgical History:  Procedure Laterality Date  . ABDOMINAL HYSTERECTOMY    . HIP ARTHROPLASTY Right 01/26/2016   Procedure: ARTHROPLASTY BIPOLAR HIP (HEMIARTHROPLASTY);  Surgeon: Renette Butters, MD;  Location: [facility];  Service: Orthopedics;  Laterality: Right;  . KNEE ARTHROSCOPY Bilateral   . REPLACEMENT TOTAL KNEE BILATERAL     1998 left , 1992 right    Family History  Problem Relation Age of Onset  . Arthritis Mother   . Depression Mother   . Heart attack Father        33   . Hypertension Father     No Known Allergies  No current facility-administered medications on file prior to visit.    Current Outpatient Medications on File Prior to Visit  Medication Sig Dispense Refill  . amLODipine (NORVASC) 5 MG tablet Take 1 tablet (5 mg total) by mouth daily. 90 tablet  3  . aspirin EC 81 MG tablet Take 81 mg by mouth daily.    Marland Kitchen atorvastatin (LIPITOR) 10 MG tablet TAKE ONE TABLET BY MOUTH AT BEDTIME 90 tablet 1  . B-D UF III MINI PEN NEEDLES 31G X 5 MM MISC USE DAILY WITH LANTUS SOLOSTAR 100 each 1  . Blood Glucose Monitoring Suppl (ACCU-CHEK AVIVA PLUS) w/Device KIT Use to check blood sugar once a day.  DX E11.9 1 kit 0  . buPROPion (WELLBUTRIN) 100 MG tablet Take 1 tablet (100 mg total) 2 (two) times daily by mouth. 180 tablet 1  . carvedilol (COREG) 12.5 MG tablet TAKE 1 TABLET BY MOUTH TWO TIMES A DAY WITH MEAL 180 tablet 1  . cephALEXin (KEFLEX) 500 MG capsule Take 1 capsule (500 mg total) by mouth 2 (two) times daily. 20 capsule 0  . glucose blood (ACCU-CHEK AVIVA PLUS) test strip USE TO TEST BLOOD SUGAR TWO TIMES A DAY  Dx.  E11.22, N18.3, Z79.4 200 each 1  . hydrOXYzine (ATARAX/VISTARIL) 25 MG tablet TAKE ONE TABLET BY MOUTH TWICE A DAY AS NEEDED 60 tablet 5  . Lancet Devices (ACCU-CHEK SOFTCLIX) lancets Use to check blood sugar twice a day.  DX E11.9 100 each 5  . LANTUS SOLOSTAR 100 UNIT/ML Solostar Pen INJECT 14 UNITS INTO THE SKIN BEFORE LUNCH 15 mL 1  . levothyroxine (SYNTHROID, LEVOTHROID) 125 MCG tablet 1/2 tablet by mouth once daily 45 tablet 1  . linagliptin (TRADJENTA) 5 MG TABS tablet Take 1 tablet (5 mg total) daily by mouth. 30 tablet 11  . lisinopril (PRINIVIL,ZESTRIL) 40 MG tablet TAKE ONE TABLET BY MOUTH DAILY 90 tablet 0  . Zoster Vaccine Adjuvanted [facility]) injection Inject 0.5 IM now and again in 2-6 months. 0.5 mL 1    BP (!) 124/57   Pulse 71   Temp 98.5 F (36.9 C) (Oral)   Resp 16   Ht _0  (1.727 m)   Wt 144 lb 12.8 oz (65.7 kg)   SpO2 97%   BMI 22.02 kg/m       Objective:   Physical Exam   General- No acute distress. Pleasant patient. Neck- Full range of motion, no jvd Lungs- Clear, even and unlabored. Heart- regular rate and rhythm. Neurologic- CNII- XII grossly intact.  Left thumb- swollen about twice size of baseline. Above joint mild dc.Some redness down toward 1st metacarpal.        Assessment & Plan:  For your cellulitis and abscess of the left thumb, we did get a culture today.  I tried to refer you to after Hours orthopedic clinic but based on clinical description they recommend you be seen in the emergency department.  This is likely the best place for you to be seen as you might benefit from IV antibiotics and possible I&D.  I called the emergency department physician and discussed your recent clinical course.  They know you are coming down.  Discussed case with Dr. Etter Meyer today.  Beatriz Settles, Percell Miller, PA-C

## 2016-12-02 NOTE — Patient Instructions (Addendum)
  For your cellulitis and abscess of the left thumb, we did get a culture today.  I tried to refer you to after Hours orthopedic clinic but based on clinical description they recommend you be seen in the emergency department.  This is likely the best place for you to be seen as you might benefit from IV antibiotics and possible I&D.  I called the emergency department physician and discussed your recent clinical course.  They know you are coming down.

## 2016-12-02 NOTE — Telephone Encounter (Signed)
Could you please contact pt and ask her how her thumb is doing?

## 2016-12-02 NOTE — Telephone Encounter (Signed)
Pt being seen by PA, Saguier at 2:45pm today.

## 2016-12-02 NOTE — ED Provider Notes (Signed)
[facility] EMERGENCY DEPARTMENT Provider Note   CSN: [id number] Arrival date & time: 12/02/16  1630     History   Chief Complaint Chief Complaint  Patient presents with  . Abscess    HPI Jessica Meyer is a 81 y.o. female.  The history is provided by the patient.  Abscess  Associated symptoms: no fever, no headaches, no nausea and no vomiting    81 year old female who presents with infection of the left thumb.  She has a history of diabetes, hypertension hyperlipidemia.  Reports that after Thanksgiving she began to notice swelling, warmth, erythema related to the left thumb.  She does not recall any injury or trauma to the hand.  Has been seen by her primary care doctor for this.  Last Tuesday received in gram of ceftriaxone and was started on Keflex.  On follow-up today her symptoms have not improved so she was sent to the ED for evaluation.  She denies any fevers, nausea or vomiting, confusion.  Has noted some mild purulent drainage today when it was pushed on by the PA upstairs.   Past Medical History:  Diagnosis Date  . Arthritis   . Diabetes mellitus without complication ([place])   . History of chicken pox   . Hyperlipidemia   . Hypertension   . Thyroid disease    hypothyroid    Patient Active Problem List   Diagnosis Date Noted  . CKD stage G3b/A1, GFR 30-44 and albumin creatinine ratio <30 mg/g (HCC) 08/10/2016  . Left hip pain 06/14/2016  . Type 2 diabetes mellitus with stage 3 chronic kidney disease, with long-term current use of insulin ([place]) 01/29/2016  . Leukocytosis 01/25/2016  . Closed fracture of right hip ([place])   . Fall   . Left knee pain 05/28/2015  . Sprain of hand, third finger, right 05/28/2015  . HTN (hypertension) 07/29/2014  . Gait instability 07/29/2014  . Hyperlipidemia 07/29/2014  . Right knee pain 04/11/2014  . ASNHL (asymmetrical sensorineural hearing loss) 08/07/2013  . 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 Hernia, rectovaginal  08/07/2013  . Hypercholesterolemia without hypertriglyceridemia 08/07/2013  . Arthritis of knee, degenerative 08/07/2013  . Degenerative arthritis of lumbar spine 08/07/2013  . Osteopenia 08/07/2013  . Adult hypothyroidism 08/07/2013  . History of artificial joint 08/07/2013  . Essential (primary) hypertension 08/07/2013  . Depression with anxiety 08/07/2013    Past Surgical History:  Procedure Laterality Date  . ABDOMINAL HYSTERECTOMY    . HIP ARTHROPLASTY Right 01/26/2016   Procedure: ARTHROPLASTY BIPOLAR HIP (HEMIARTHROPLASTY);  Surgeon: Renette Butters, MD;  Location: [facility];  Service: Orthopedics;  Laterality: Right;  . KNEE ARTHROSCOPY Bilateral   . REPLACEMENT TOTAL KNEE BILATERAL     1998 left , 1992 right    OB History    No data available       Home Medications    Prior to Admission medications   Medication Sig Start Date End Date Taking? Authorizing Provider  amLODipine (NORVASC) 5 MG tablet Take 1 tablet (5 mg total) by mouth daily. 07/18/16   Debbrah Alar, NP  aspirin EC 81 MG tablet Take 81 mg by mouth daily.    [provider]  atorvastatin (LIPITOR) 10 MG tablet TAKE ONE TABLET BY MOUTH AT BEDTIME 08/02/16   Debbrah Alar, NP  B-D UF III MINI PEN NEEDLES 31G X 5 MM MISC USE DAILY WITH LANTUS SOLOSTAR 08/03/16   Shelda Pal, DO  Blood Glucose Monitoring Suppl (ACCU-CHEK AVIVA PLUS) w/Device  KIT Use to check blood sugar once a day.  DX E11.9 11/24/15   Debbrah Alar, NP  buPROPion (WELLBUTRIN) 100 MG tablet Take 1 tablet (100 mg total) 2 (two) times daily by mouth. 11/07/16   Debbrah Alar, NP  carvedilol (COREG) 12.5 MG tablet TAKE 1 TABLET BY MOUTH TWO TIMES A DAY WITH MEAL 08/02/16   Debbrah Alar, NP  cephALEXin (KEFLEX) 500 MG capsule Take 1 capsule (500 mg total) by mouth 2 (two) times daily. 11/29/16   Roma Schanz R, DO  glucose blood (ACCU-CHEK AVIVA PLUS) test strip USE TO TEST BLOOD SUGAR TWO TIMES A  DAY  Dx. E11.22, N18.3, Z79.4 09/20/16   Debbrah Alar, NP  hydrOXYzine (ATARAX/VISTARIL) 25 MG tablet TAKE ONE TABLET BY MOUTH TWICE A DAY AS NEEDED 08/28/16   Debbrah Alar, NP  Lancet Devices [facility]) lancets Use to check blood sugar twice a day.  DX E11.9 12/03/15   Debbrah Alar, NP  LANTUS SOLOSTAR 100 UNIT/ML Solostar Pen INJECT 14 UNITS INTO THE SKIN BEFORE LUNCH 07/13/16   Debbrah Alar, NP  levothyroxine (SYNTHROID, LEVOTHROID) 125 MCG tablet 1/2 tablet by mouth once daily 07/19/16   Debbrah Alar, NP  linagliptin (TRADJENTA) 5 MG TABS tablet Take 1 tablet (5 mg total) daily by mouth. 11/10/16   Philemon Kingdom, MD  lisinopril (PRINIVIL,ZESTRIL) 40 MG tablet TAKE ONE TABLET BY MOUTH DAILY 09/06/16   Debbrah Alar, NP  sulfamethoxazole-trimethoprim (BACTRIM DS,SEPTRA DS) 800-160 MG tablet Take 1 tablet by mouth 2 (two) times daily for 7 days. 12/02/16 12/09/16  Forde Dandy, MD  Zoster Vaccine Adjuvanted [facility]) injection Inject 0.5 IM now and again in 2-6 months. 11/07/16   Debbrah Alar, NP    Family History Family History  Problem Relation Age of Onset  . Arthritis Mother   . Depression Mother   . Heart attack Father        55   . Hypertension Father     Social History Social History   Tobacco Use  . Smoking status: Never Smoker  . Smokeless tobacco: Current User    Types: Snuff  Substance Use Topics  . Alcohol use: Yes    Alcohol/week: 0.6 oz    Types: 1 Standard drinks or equivalent per week    Comment: occasional beer  . Drug use: No     Allergies   Patient has no known allergies.   Review of Systems Review of Systems  Constitutional: Negative for fever.  Gastrointestinal: Negative for nausea and vomiting.  Musculoskeletal:       Swelling, redness to the left thumb  Neurological: Negative for headaches.  Hematological: Does not bruise/bleed easily.  All other systems reviewed and are  negative.    Physical Exam Updated Vital Signs BP 136/72   Pulse 76   Temp 98.2 F (36.8 C) (Oral)   Resp 20   Ht 5' 8"  (1.727 m)   Wt 65.3 kg (144 lb)   SpO2 100%   BMI 21.90 kg/m   Physical Exam Physical Exam  Nursing note and vitals reviewed. Constitutional: Well developed, well nourished, non-toxic, and in no acute distress Head: Normocephalic and atraumatic.  Mouth/Throat: Oropharynx is clear and moist.  Neck: Normal range of motion. Neck supple.  Cardiovascular: Normal rate and regular rhythm.   Pulmonary/Chest: Effort normal and breath sounds normal.  Abdominal: Soft. There is no tenderness. There is no rebound and no guarding.  Musculoskeletal: Normal range of motion.  Neurological: Alert, no facial droop, fluent  speech, moves all extremities symmetrically Skin: Skin is warm and dry. See picture. Psychiatric: Cooperative         ED Treatments / Results  Labs (all labs ordered are listed, but only abnormal results are displayed) Labs Reviewed  BASIC METABOLIC PANEL - Abnormal; Notable for the following components:      Result Value   Sodium 134 (*)    Glucose, Bld 114 (*)    BUN 29 (*)    Creatinine, Ser 1.42 (*)    GFR calc non Af Amer 34 (*)    GFR calc Af Amer 40 (*)    All other components within normal limits  I-STAT CG4 LACTIC ACID, ED - Abnormal; Notable for the following components:   Lactic Acid, Venous 0.41 (*)    All other components within normal limits  CBC WITH DIFFERENTIAL/PLATELET  I-STAT CG4 LACTIC ACID, ED    EKG  EKG Interpretation None       Radiology Dg Hand Complete Left  Result Date: 12/02/2016 CLINICAL DATA:  Left hand swelling and redness. EXAM: LEFT HAND - COMPLETE 3+ VIEW COMPARISON:  No recent. FINDINGS: Diffuse degenerative change. Degenerative changes most prominent first carpometacarpal joint. No acute bony abnormality identified. No evidence of fracture. Vascular calcification. IMPRESSION: 1. Diffuse  degenerative change. Degenerative changes most prominent the first carpometacarpal joint. No acute bony abnormality. 2. Peripheral vascular disease . Electronically Signed   By: Marcello Moores  Register   On: 12/02/2016 17:24    Procedures .Marland KitchenIncision and Drainage Date/Time: 12/02/2016 6:36 PM Performed by: Forde Dandy, MD Authorized by: Forde Dandy, MD   Consent:    Consent obtained:  Verbal   Consent given by:  Patient   Risks discussed:  Bleeding, infection, incomplete drainage and pain   Alternatives discussed:  No treatment and delayed treatment Location:    Type:  Abscess   Size:  1 x 1 cm   Location: left thumb. Pre-procedure details:    Skin preparation:  Betadine Procedure type:    Complexity:  Simple Procedure details:    Needle aspiration: no     Incision types:  Single straight   Incision depth:  Submucosal   Scalpel blade:  11   Wound management:  Probed and deloculated and irrigated with saline   Drainage:  Purulent and bloody   Drainage amount:  Scant   Wound treatment:  Wound left open   Packing materials:  1/4 in gauze   Amount 1/4":  2 cm Post-procedure details:    Patient tolerance of procedure:  Tolerated well, no immediate complications   (including critical care time)  Medications Ordered in ED Medications  lidocaine (XYLOCAINE) 2 % (with pres) injection 200 mg (not administered)     Initial Impression / Assessment and Plan / ED Course  I have reviewed the triage vital signs and the nursing notes.  Pertinent labs & imaging results that were available during my care of the patient were reviewed by me and considered in my medical decision making (see chart for details).     Presents with cellulitis and abscess of the left thumb.  Discussed with Dr. Burney Gauze. He was comfortable having me I&D at bedside. Recommended bactrim as outpatient and d/c Keflex. Recommended wound re-check on Sunday and have packing taken out. If appropriately healing, he will  arrange follow-up on Monday.  She is well appearing.  No systemic signs or symptoms of illness.  Afebrile, hemodynamically stable, without leukocytosis.  I&D performed at bedside.  Is  discharged with Bactrim.  Wound care instructions reviewed.  Patient to follow-up on Sunday for packing removal and wound recheck.  Plan for follow-up with Dr. Weingold on Monday.   Final Clinical Impressions(s) / ED Diagnoses   Final diagnoses:  Abscess of left thumb    ED Discharge Orders        Ordered    sulfamethoxazole-trimethoprim (BACTRIM DS,SEPTRA DS) 800-160 MG tablet  2 times daily     11 /30/18 1835       Forde Dandy, MD 12/02/16 [phone]

## 2016-12-02 NOTE — Discharge Instructions (Signed)
Please stop taking Keflex and start taking Bactrim  Please return on Sunday to have packing removed and wound re-checked.  On Monday, please call Dr. Ronie SpiesWeingold's office for appointment time.   Return without fail for worsening symptoms, including worsening redness/swelling, escalating pain, fever, confusion or any other symptoms concerning ot you.

## 2016-12-04 ENCOUNTER — Telehealth: Payer: Self-pay | Admitting: Family

## 2016-12-04 NOTE — Telephone Encounter (Signed)
Spoke to pt and she told me packing has come out of her wound and it is feeling much better- less red and warm.  Advised her to continue antibiotics and to come see me in the office this week if it does not continue to improve. Pt verbalizes understanding.

## 2016-12-05 ENCOUNTER — Encounter: Payer: Self-pay | Admitting: Family

## 2016-12-05 LAB — WOUND CULTURE
MICRO NUMBER: 81348092
SPECIMEN QUALITY: ADEQUATE

## 2016-12-05 NOTE — Telephone Encounter (Signed)
Spoke with patient's daughter.  She reports that the thumb is not nearly as swollen or red.  Packing is out.  Patient is tolerating antibiotics.  Advised her at this point it is okay for her to hold off on seeing a hand specialist.  Come back and see me if it is not resolved by the time she completes the antibiotics call if symptoms do not continue to improve.  She is agreeable to plan and will contact me if needed.

## 2016-12-12 ENCOUNTER — Other Ambulatory Visit: Payer: Self-pay | Admitting: Family

## 2016-12-13 ENCOUNTER — Encounter: Payer: Self-pay | Admitting: Family

## 2016-12-13 DIAGNOSIS — L03019 Cellulitis of unspecified finger: Secondary | ICD-10-CM

## 2016-12-14 ENCOUNTER — Encounter: Payer: Self-pay | Admitting: Family

## 2016-12-14 MED ORDER — SULFAMETHOXAZOLE-TRIMETHOPRIM 800-160 MG PO TABS: 1.0000 | ORAL_TABLET | Freq: Two times a day (BID) | ORAL | 0 refills | Status: DC

## 2016-12-15 ENCOUNTER — Other Ambulatory Visit: Payer: Self-pay | Admitting: Family

## 2016-12-16 ENCOUNTER — Telehealth: Payer: Self-pay | Admitting: Family

## 2016-12-16 ENCOUNTER — Ambulatory Visit: Payer: Self-pay | Admitting: Family

## 2016-12-16 ENCOUNTER — Telehealth: Payer: Self-pay | Admitting: *Deleted

## 2016-12-16 ENCOUNTER — Other Ambulatory Visit: Payer: Self-pay | Admitting: *Deleted

## 2016-12-16 MED ORDER — ATORVASTATIN CALCIUM 10 MG PO TABS: 10.0000 mg | ORAL_TABLET | Freq: Every day | ORAL | 3 refills | Status: DC

## 2016-12-16 MED ORDER — CARVEDILOL 12.5 MG PO TABS: ORAL_TABLET | ORAL | 3 refills | Status: DC

## 2016-12-16 MED ORDER — HYDROXYZINE HCL 25 MG PO TABS: 25.0000 mg | ORAL_TABLET | Freq: Two times a day (BID) | ORAL | 1 refills | Status: DC | PRN

## 2016-12-16 MED ORDER — SULFAMETHOXAZOLE-TRIMETHOPRIM 800-160 MG PO TABS: 1.0000 | ORAL_TABLET | Freq: Two times a day (BID) | ORAL | 0 refills | Status: DC

## 2016-12-16 NOTE — Telephone Encounter (Signed)
[facility] day supply of hydroxyzine for January refill. Rx sent.

## 2016-12-16 NOTE — Telephone Encounter (Signed)
Copied from CRM [phone]. Topic: Quick Communication - See Telephone Encounter >> Dec 16, 2016  3:42 PM Terisa Starraylor, Brittany L wrote: CRM for notification. See Telephone encounter for:   12/16/16.  Pt's daughter called and said she has not received a call yet regarding hand specialist. She wants to know if she needs to continue the antibiotic through the weekend. She is concerned bc they run out this weekend. She has been talking to Dr Peggyann Juba'Sullivan over [facility]. Call back @ [phone]. Daughter does not want to end up in the ER over weekend with her mom.

## 2016-12-16 NOTE — Telephone Encounter (Signed)
Per verbal from PCP. Referral should be with Dr Mina MarbleWeingold since he saw pt in the ER. Spoke with Tilman NeatSarah and Robin at [phone] and obtained appt with Dr Mina MarbleWeingold on Monday at 3:15pm. Pt's daughter notified and we will also send enough antibiotic to get pt through Monday when Dr Mina MarbleWeingold can determine treatment going forward. 4 tablets sent to [facility].

## 2016-12-16 NOTE — Telephone Encounter (Signed)
Copied from CRM [phone]. Topic: Quick Communication - See Telephone Encounter >> Dec 16, 2016 10:04 AM Cipriano BunkerLambe, Annette S wrote: CRM for notification. See Telephone encounter for:   [facility]  [phone]   Requesting 90 day supply for 2019: Atorvastatin Calcium 10 MG TAKE ONE TABLET BY MOUTH AT BEDTIME  Carvedilol 12.5 MG TAKE 1 TABLET BY MOUTH TWO TIMES A DAY WITH MEAL   12/16/16.

## 2016-12-16 NOTE — Telephone Encounter (Signed)
Rx sent to pharmacy for 6 month supply - patient has follow up appointment 02/2017

## 2016-12-19 DIAGNOSIS — M79645 Pain in left finger(s): Secondary | ICD-10-CM | POA: Diagnosis not present

## 2017-01-31 ENCOUNTER — Other Ambulatory Visit: Payer: Self-pay | Admitting: Family

## 2017-02-13 ENCOUNTER — Encounter: Payer: Self-pay | Admitting: Family

## 2017-02-13 ENCOUNTER — Ambulatory Visit (INDEPENDENT_AMBULATORY_CARE_PROVIDER_SITE_OTHER): Payer: Medicare Other | Admitting: Family

## 2017-02-13 VITALS — BP 138/79 | HR 70 | Temp 98.9°F | Resp 16 | Ht 68.0 in | Wt 148.0 lb

## 2017-02-13 DIAGNOSIS — F329 Major depressive disorder, single episode, unspecified: Secondary | ICD-10-CM

## 2017-02-13 DIAGNOSIS — E119 Type 2 diabetes mellitus without complications: Secondary | ICD-10-CM

## 2017-02-13 DIAGNOSIS — F32A Depression, unspecified: Secondary | ICD-10-CM

## 2017-02-13 DIAGNOSIS — I10 Essential (primary) hypertension: Secondary | ICD-10-CM

## 2017-02-13 DIAGNOSIS — E039 Hypothyroidism, unspecified: Secondary | ICD-10-CM | POA: Diagnosis not present

## 2017-02-13 DIAGNOSIS — Z794 Long term (current) use of insulin: Secondary | ICD-10-CM | POA: Diagnosis not present

## 2017-02-13 DIAGNOSIS — E785 Hyperlipidemia, unspecified: Secondary | ICD-10-CM | POA: Diagnosis not present

## 2017-02-13 MED ORDER — LINAGLIPTIN 5 MG PO TABS: 5.0000 mg | ORAL_TABLET | Freq: Every day | ORAL | 11 refills | Status: DC

## 2017-02-13 NOTE — Patient Instructions (Signed)
Please complete lab work prior to leaving.   

## 2017-02-13 NOTE — Progress Notes (Signed)
Subjective:    Patient ID: Jessica Meyer, female    DOB: 1935/02/09, 82 y.o.   MRN: [id number]  HPI  Patient is a 82 yr old female who presents today for follow up.  1) HTN- maintained on amlodipine 89m and lisinopril.  BP Readings from Last 3 Encounters:  02/13/17 138/79  12/02/16 (!) 146/78  12/02/16 (!) 124/57   2) Hypothyroid- maintained on synthroid.  Lab Results  Component Value Date   TSH 1.16 07/18/2016   3) Depression/anxiety- maintained on wellbutrin. Reports mood is good.   4) DM2- followed by endo.  Lab Results  Component Value Date   HGBA1C 8.2 11/10/2016   HGBA1C 7.6 08/11/2016   HGBA1C 8.6 05/09/2016   Lab Results  Component Value Date   MICROALBUR <0.7 05/11/2015   LDLCALC 98 11/07/2016   CREATININE 1.42 (H) 12/02/2016   5) Hyperlipidemia- maintained on lipitor.  Lab Results  Component Value Date   CHOL 170 11/07/2016   HDL 50.80 11/07/2016   LDLCALC 98 11/07/2016   TRIG 108.0 11/07/2016   CHOLHDL 3 11/07/2016    Review of Systems See HPI  Past Medical History:  Diagnosis Date  . Arthritis   . [place]   . History of chicken pox   . Hyperlipidemia   . Hypertension   . Thyroid disease    hypothyroid     Social History   Socioeconomic History  . Marital status: Single    Spouse name: Not on file  . Number of children: Not on file  . Years of education: Not on file  . Highest education level: Not on file  Social Needs  . Financial resource strain: Not on file  . Food insecurity - worry: Not on file  . Food insecurity - inability: Not on file  . Transportation needs - medical: Not on file  . Transportation needs - non-medical: Not on file  Occupational History  . Not on file  Tobacco Use  . Smoking status: Never Smoker  . Smokeless tobacco: Current User    Types: Snuff  Substance and Sexual Activity  . Alcohol use: Yes    Alcohol/week: 0.6 oz    Types: 1 Standard drinks or equivalent  per week    Comment: occasional beer  . Drug use: No  . Sexual activity: Not on file  Other Topics Concern  . Not on file  Social History Narrative   Retired homemaker   Widow   Completed 7th grade   2 daughter   [place]andora Le[place]and daughter. [place]  4 grandchildren   Enjoys puzzles, gardening.    Past Surgical History:  Procedure Laterality Date  . ABDOMINAL HYSTERECTOMY    . HIP ARTHROPLASTY Right 01/26/2016   Procedure: ARTHROPLASTY BIPOLAR HIP (HEMIARTHROPLASTY);  Surgeon: TRenette Butters MD;  [place]  Service: Orthopedics;  Laterality: Right;  . KNEE ARTHROSCOPY Bilateral   . REPLACEMENT TOTAL KNEE BILATERAL     1998 left , 1992 right    Family History  Problem Relation Age of Onset  . Arthritis Mother   . Depression Mother   . Heart attack Father        561  . Hypertension Father     No Known Allergies  Current Outpatient Medications on File Prior to Visit  Medication Sig Dispense Refill  . amLODipine (NORVASC) 5 MG tablet Take 1 tablet (5 mg total) by mouth daily. 90 tablet 3  .  aspirin EC 81 MG tablet Take 81 mg by mouth daily.    Marland Kitchen atorvastatin (LIPITOR) 10 MG tablet Take 1 tablet (10 mg total) by mouth at bedtime. 90 tablet 3  . B-D UF III MINI PEN NEEDLES 31G X 5 MM MISC USE DAILY WITH LANTUS SOLOSTAR 100 each 1  . Blood Glucose Monitoring Suppl (ACCU-CHEK AVIVA PLUS) w/Device KIT Use to check blood sugar once a day.  DX E11.9 1 kit 0  . buPROPion (WELLBUTRIN) 100 MG tablet Take 1 tablet (100 mg total) 2 (two) times daily by mouth. 180 tablet 1  . carvedilol (COREG) 12.5 MG tablet TAKE 1 TABLET BY MOUTH TWO TIMES A DAY WITH MEAL 180 tablet 3  . cephALEXin (KEFLEX) 500 MG capsule Take 1 capsule (500 mg total) by mouth 2 (two) times daily. 20 capsule 0  . glucose blood (ACCU-CHEK AVIVA PLUS) test strip USE TO TEST BLOOD SUGAR TWO TIMES A DAY  Dx. E11.22, N18.3, Z79.4 200 each 1  . hydrOXYzine (ATARAX/VISTARIL) 25 MG tablet Take 1 tablet (25  mg total) by mouth 2 (two) times daily as needed. 180 tablet 1  . Lancet Devices (ACCU-CHEK SOFTCLIX) lancets Use to check blood sugar twice a day.  DX E11.9 100 each 5  . LANTUS SOLOSTAR 100 UNIT/ML Solostar Pen INJECT 14 UNITS UNDER THE SKIN BEFORE LUNCH 15 mL 3  . levothyroxine (SYNTHROID, LEVOTHROID) 125 MCG tablet 1/2 tablet by mouth once daily 45 tablet 1  . lisinopril (PRINIVIL,ZESTRIL) 40 MG tablet TAKE ONE TABLET BY MOUTH DAILY 90 tablet 0  . sulfamethoxazole-trimethoprim (BACTRIM DS,SEPTRA DS) 800-160 MG tablet Take 1 tablet by mouth 2 (two) times daily. 4 tablet 0  . Zoster Vaccine Adjuvanted [facility]) injection Inject 0.5 IM now and again in 2-6 months. 0.5 mL 1   No current facility-administered medications on file prior to visit.     BP 138/79 (BP Location: Left Arm, Patient Position: Sitting, Cuff Size: Small)   Pulse 70   Temp 98.9 F (37.2 C) (Oral)   Resp 16   Ht _0  (1.727 m)   Wt 148 lb (67.1 kg)   SpO2 97%   BMI 22.50 kg/m       Objective:   Physical Exam  Constitutional: She is oriented to person, place, and time. She appears well-developed and well-nourished.  Cardiovascular: Normal rate, regular rhythm and normal heart sounds.  No murmur heard. Pulmonary/Chest: Effort normal and breath sounds normal. No respiratory distress. She has no wheezes.  Musculoskeletal: She exhibits no edema.  Neurological: She is alert and oriented to person, place, and time.  Psychiatric: She has a normal mood and affect. Her behavior is normal. Judgment and thought content normal.          Assessment & Plan:  HTN- BP stable on current meds. BP Readings from Last 3 Encounters:  02/13/17 138/79  12/02/16 (!) 146/78  12/02/16 (!) 124/57   DM2- fair control, management per endo.    Hyperlipidemia- LDL at goal on statin, continue same.  Depression- stable on wellbutrin, continue same.

## 2017-02-14 ENCOUNTER — Encounter: Payer: Self-pay | Admitting: Family

## 2017-02-14 LAB — TSH: TSH: 3.13 u[IU]/mL (ref 0.35–4.50)

## 2017-03-05 ENCOUNTER — Other Ambulatory Visit: Payer: Self-pay | Admitting: Family Medicine

## 2017-03-10 ENCOUNTER — Encounter: Payer: Self-pay | Admitting: Internal Medicine

## 2017-03-10 ENCOUNTER — Ambulatory Visit (INDEPENDENT_AMBULATORY_CARE_PROVIDER_SITE_OTHER): Payer: Medicare Other | Admitting: Internal Medicine

## 2017-03-10 VITALS — BP 148/74 | HR 75 | Ht 68.0 in | Wt 148.2 lb

## 2017-03-10 DIAGNOSIS — Z794 Long term (current) use of insulin: Secondary | ICD-10-CM

## 2017-03-10 DIAGNOSIS — E1122 Type 2 diabetes mellitus with diabetic chronic kidney disease: Secondary | ICD-10-CM | POA: Diagnosis not present

## 2017-03-10 DIAGNOSIS — E7800 Pure hypercholesterolemia, unspecified: Secondary | ICD-10-CM | POA: Diagnosis not present

## 2017-03-10 DIAGNOSIS — N183 Chronic kidney disease, stage 3 (moderate): Secondary | ICD-10-CM

## 2017-03-10 LAB — POCT GLYCOSYLATED HEMOGLOBIN (HGB A1C): Hemoglobin A1C: 8.3

## 2017-03-10 MED ORDER — INSULIN GLARGINE 100 UNIT/ML SOLOSTAR PEN: PEN_INJECTOR | SUBCUTANEOUS | 3 refills | Status: DC

## 2017-03-10 NOTE — Progress Notes (Signed)
Patient ID: Jessica Meyer, female   DOB: 09-10-1935, 82 y.o.   MRN: [id number]   HPI: Jessica Meyer is a 82 y.o.-year-old female, returning for returning for follow-up for DM2, dx in 1990s, insulin-dependent since ~2012, uncontrolled, with complications (CKD stage 3-4). Last visit 4 months ago.  She is here with her daughter who offers most of the history, especially regarding her medication doses, Medical history and CBGs.  Last hemoglobin A1c was: Lab Results  Component Value Date   HGBA1C 8.2 11/10/2016   HGBA1C 7.6 08/11/2016   HGBA1C 8.6 05/09/2016   Pt is on a regimen of: - Lantus 14 units at bedtime >> in a.m. - Tradjenta 5 mg daily before breakfast- added back 11/2016 Tradjenta was added 05/2016 >> stopped b/c doughnut hole, in 09/2016. She was on Glipizide >> hypoglycemia.  Pt checks her sugars twice a day:  - am: 117-140 >> 109-167 >> 88-140 >> 106-196 - 2h after b'fast: n/c >> 161-231 - before lunch:108-187, 215 >> 145-150 >> n/c - 2h after lunch: n/c >> 116, 118 - before dinner: n/c  - 2h after dinner: n/c >> 86-152 >>  193-268 - bedtime: 125, 149, 205, 264 >> 160-187 >> 235 - nighttime: n/c Lows: 27 x1 in 01/2016... >> 88 >> 116; she has hypoglycemia awareness at 90s. Highest sugar was 187 >> 268.  Glucometer: Accu-Chek Aviva  Pt's meals are: - Breakfast: eggs + sausage and bacon - Lunch: can of soup + [place] sandwich - Dinner: canned green beans, baked potatoes, pork chops or [place] sandwich - Snacks: sandwich at bedtime (!)  -She has CKD stage III-IV, last BUN/creatinine:  Lab Results  Component Value Date   BUN 29 (H) 12/02/2016   BUN 28 (H) 11/07/2016   CREATININE 1.42 (H) 12/02/2016   CREATININE 1.40 (H) 11/07/2016   07/18/2016: GFR 35.37  Lab Results  Component Value Date   GFRNONAA 34 (L) 12/02/2016   GFRNONAA 38 (L) 01/26/2016   GFRNONAA 37 (L) 01/25/2016   GFRNONAA 32 (L) 11/07/2015   Lab Results  Component Value Date   MICRALBCREAT 1.0  05/11/2015   MICRALBCREAT 1.1 07/29/2014  O lisinopril 10. -+ HL last set of lipids: Lab Results  Component Value Date   CHOL 170 11/07/2016   HDL 50.80 11/07/2016   LDLCALC 98 11/07/2016   TRIG 108.0 11/07/2016   CHOLHDL 3 11/07/2016  On Lipitor 10. - last eye exam was in 03/2016: No DR -No numbness and tingling in her feet.  She also has a history of hypothyroidism, on levothyroxine, managed by PCP.  Reviewed most recent TSH >> normal: Lab Results  Component Value Date   TSH 3.13 02/13/2017   She also has HTN, GERD.  ROS: Constitutional: no weight gain/no weight loss, no fatigue, no subjective hyperthermia, no subjective hypothermia Eyes: no blurry vision, no xerophthalmia ENT: no sore throat, no nodules palpated in throat, no dysphagia, no odynophagia, no hoarseness Cardiovascular: no CP/no SOB/no palpitations/no leg swelling Respiratory: no cough/no SOB/no wheezing Gastrointestinal: no N/no V/no D/no C/no acid reflux Musculoskeletal: no muscle aches/no joint aches Skin: no rashes, no hair loss Neurological: no tremors/no numbness/no tingling/no dizziness  I reviewed pt's medications, allergies, PMH, social hx, family hx, and changes were documented in the history of present illness. Otherwise, unchanged from my initial visit note.  Past Medical History:  Diagnosis Date  . Arthritis   . Diabetes mellitus without complication ([place])   . History of chicken pox   . Hyperlipidemia   .  Hypertension   . Thyroid disease    hypothyroid   Past Surgical History:  Procedure Laterality Date  . ABDOMINAL HYSTERECTOMY    . HIP ARTHROPLASTY Right 01/26/2016   Procedure: ARTHROPLASTY BIPOLAR HIP (HEMIARTHROPLASTY);  Surgeon: Renette Butters, MD;  Location: [facility];  Service: Orthopedics;  Laterality: Right;  . KNEE ARTHROSCOPY Bilateral   . REPLACEMENT TOTAL KNEE BILATERAL     1998 left , 1992 right   Social History   Social History Main Topics  . Smoking status: Never  Smoker  . Smokeless tobacco: Current User    Types: Snuff  . Alcohol use 0.6 oz/week    1 Standard drinks or equivalent per week     Comment: occasional beer, wine  . Drug use: No   Social History Narrative   Retired,  homemaker   Widow   Completed 7th grade   2 daughter   Son and daughter. Daughter local, son in [place]   4 grandchildren   Enjoys puzzles, gardening.      Current Outpatient Medications on File Prior to Visit  Medication Sig Dispense Refill  . amLODipine (NORVASC) 5 MG tablet Take 1 tablet (5 mg total) by mouth daily. 90 tablet 3  . aspirin EC 81 MG tablet Take 81 mg by mouth daily.    Marland Kitchen atorvastatin (LIPITOR) 10 MG tablet Take 1 tablet (10 mg total) by mouth at bedtime. 90 tablet 3  . B-D UF III MINI PEN NEEDLES 31G X 5 MM MISC USE DAILY AS DIRECTED 100 each 1  . Blood Glucose Monitoring Suppl (ACCU-CHEK AVIVA PLUS) w/Device KIT Use to check blood sugar once a day.  DX E11.9 1 kit 0  . buPROPion (WELLBUTRIN) 100 MG tablet Take 1 tablet (100 mg total) 2 (two) times daily by mouth. 180 tablet 1  . carvedilol (COREG) 12.5 MG tablet TAKE 1 TABLET BY MOUTH TWO TIMES A DAY WITH MEAL 180 tablet 3  . glucose blood (ACCU-CHEK AVIVA PLUS) test strip USE TO TEST BLOOD SUGAR TWO TIMES A DAY  Dx. E11.22, N18.3, Z79.4 200 each 1  . hydrOXYzine (ATARAX/VISTARIL) 25 MG tablet Take 1 tablet (25 mg total) by mouth 2 (two) times daily as needed. 180 tablet 1  . Lancet Devices (ACCU-CHEK SOFTCLIX) lancets Use to check blood sugar twice a day.  DX E11.9 100 each 5  . LANTUS SOLOSTAR 100 UNIT/ML Solostar Pen INJECT 14 UNITS UNDER THE SKIN BEFORE LUNCH 15 mL 3  . levothyroxine (SYNTHROID, LEVOTHROID) 125 MCG tablet 1/2 tablet by mouth once daily 45 tablet 1  . linagliptin (TRADJENTA) 5 MG TABS tablet Take 1 tablet (5 mg total) by mouth daily. 30 tablet 11  . lisinopril (PRINIVIL,ZESTRIL) 40 MG tablet TAKE ONE TABLET BY MOUTH DAILY 90 tablet 0  . sulfamethoxazole-trimethoprim (BACTRIM DS,SEPTRA  DS) 800-160 MG tablet Take 1 tablet by mouth 2 (two) times daily. 4 tablet 0  . Zoster Vaccine Adjuvanted [facility]) injection Inject 0.5 IM now and again in 2-6 months. 0.5 mL 1   No current facility-administered medications on file prior to visit.    No Known Allergies Family History  Problem Relation Age of Onset  . Arthritis Mother   . Depression Mother   . Heart attack Father        72   . Hypertension Father    PE: BP (!) 148/74   Pulse 75   Ht _0  (1.727 m)   Wt 148 lb 3.2 oz (67.2 kg)   SpO2  96%   BMI 22.53 kg/m  Wt Readings from Last 3 Encounters:  03/10/17 148 lb 3.2 oz (67.2 kg)  02/13/17 148 lb (67.1 kg)  12/02/16 144 lb (65.3 kg)   Constitutional: Normal weight, in NAD Eyes: PERRLA, EOMI, no exophthalmos ENT: moist mucous membranes, no thyromegaly, no cervical lymphadenopathy Cardiovascular: RRR, No MRG Respiratory: CTA B Gastrointestinal: abdomen soft, NT, ND, BS+ Musculoskeletal: no deformities, strength intact in all 4 Skin: moist, warm, no rashes Neurological: no tremor with outstretched hands, DTR normal in all 4  ASSESSMENT: 1. DM2, insulin-dependent, uncontrolled, with complications - CKD stage 3 >> worse lately - h/o hypoglycemia  2. HL  PLAN:  1. Patient with long-standing, uncontrolled, diabetes, on long-acting insulin and Tradjenta.  She has a history of severe hypoglycemia, so we have to be very careful to avoid hypoglycemia for her.  She had to come off Tradjenta before last visit as she was in the donut hole.  Her sugars did not worsen afterwards, however, they were increasing during the day in a stepwise fashion consistent with insufficient mealtime coverage.  We restarted Tradjenta since last visit. - sugars are still high and they increase as the day goes by. She is less active but plans to start working in the garden as the weather improves. Until then, we will increase her Lantus by 3 units. Unfortunately, we cannot use more expensive  drugs as she cannot afford them. If she enters the doughnut hole, may need low dose Glipizide XL. - I suggested to:  Patient Instructions  Please increase: - Lantus 17 units in a.m.  Continue: - Tradjenta 5 mg daily before breakfast  Please come back for a follow-up appointment in 3 months with her sugar log    - today, HbA1c is 8.3% (slightly higher) - continue checking sugars at different times of the day - check 1x a day, rotating checks - advised for yearly eye exams >> she is UTD - Return to clinic in 3 mo with sugar log    2. HL -Reviewed latest lipid panel from 11/2016 >> LDL <100 -Continues on Lipitor without side effects  Philemon Kingdom, MD PhD [facility] Endocrinology

## 2017-03-10 NOTE — Patient Instructions (Addendum)
Please increase: - Lantus 17 units in a.m.  Continue: - Tradjenta 5 mg daily before breakfast  Please come back for a follow-up appointment in 3 months with her sugar log

## 2017-03-27 ENCOUNTER — Other Ambulatory Visit: Payer: Self-pay | Admitting: Family

## 2017-04-07 ENCOUNTER — Other Ambulatory Visit: Payer: Self-pay

## 2017-04-07 DIAGNOSIS — E119 Type 2 diabetes mellitus without complications: Secondary | ICD-10-CM | POA: Diagnosis not present

## 2017-04-07 DIAGNOSIS — H47092 Other disorders of optic nerve, not elsewhere classified, left eye: Secondary | ICD-10-CM | POA: Diagnosis not present

## 2017-04-07 DIAGNOSIS — H2513 Age-related nuclear cataract, bilateral: Secondary | ICD-10-CM | POA: Diagnosis not present

## 2017-04-07 DIAGNOSIS — H35361 Drusen (degenerative) of macula, right eye: Secondary | ICD-10-CM | POA: Diagnosis not present

## 2017-04-07 MED ORDER — LINAGLIPTIN 5 MG PO TABS: 5.0000 mg | ORAL_TABLET | Freq: Every day | ORAL | 11 refills | Status: DC

## 2017-04-09 LAB — HM DIABETES EYE EXAM

## 2017-04-10 ENCOUNTER — Other Ambulatory Visit: Payer: Self-pay | Admitting: *Deleted

## 2017-04-10 ENCOUNTER — Telehealth: Payer: Self-pay | Admitting: Family

## 2017-04-10 MED ORDER — INSULIN GLARGINE 100 UNIT/ML SOLOSTAR PEN: PEN_INJECTOR | SUBCUTANEOUS | 3 refills | Status: DC

## 2017-04-10 NOTE — Telephone Encounter (Signed)
See note. Lantus increased on 03/10/17 to 17 units by Dr. Elvera LennoxGherghe, [facility]. Pharmacy needs new prescription per patient.  Routed to office. PCP Sandford CrazeMelissa O'Sullivan, NP

## 2017-04-10 NOTE — Telephone Encounter (Signed)
Copied from CRM [phone]. Topic: Quick Communication - Rx Refill/Question >> Apr 10, 2017  2:15 PM [place], [place] A wrote: Medication: Insulin Glargine (LANTUS SOLOSTAR) 100 UNIT/ML Solostar Pen [[id number]][[id number]]- per pt this dose has increased.  ???   Has the patient contacted their pharmacy? yes (Agent: If no, request that the patient contact the pharmacy for the refill.) Preferred Pharmacy (with phone number or street name): [facility] in [place] Agent: Please be advised that RX refills may take up to 3 business days. We ask that you follow-up with your pharmacy.

## 2017-04-10 NOTE — Telephone Encounter (Signed)
See earlier encounter from 04/10/17. Rx sent in at 2:35pm today. Notified pt's daughter of Rx completion.

## 2017-04-10 NOTE — Telephone Encounter (Signed)
Copied from CRM [phone]. Topic: Quick Communication - See Telephone Encounter >> Apr 10, 2017  7:51 AM Waymon AmatoBurton, Donna F wrote: CRM for notification. See Telephone encounter for: 04/10/17.pt is needing a refill on her lantus insulin -the pharmacy [place] the units has changed and they need a rx that states what she is currently taking which is 17 units  [facility] [place] [phone]   Daughter deborah grant [phone]

## 2017-04-10 NOTE — Telephone Encounter (Signed)
Also received fax requesting Rx change. Fax forwarded to Dr Charlean SanfilippoGherghe's office as she is managing pt's diabetes.

## 2017-04-10 NOTE — Telephone Encounter (Signed)
Sent updated Rx for Lantus as requested.

## 2017-04-20 ENCOUNTER — Encounter: Payer: Self-pay | Admitting: Family

## 2017-05-04 ENCOUNTER — Other Ambulatory Visit: Payer: Self-pay | Admitting: Family

## 2017-05-15 ENCOUNTER — Encounter: Payer: Self-pay | Admitting: Family

## 2017-05-15 ENCOUNTER — Ambulatory Visit (INDEPENDENT_AMBULATORY_CARE_PROVIDER_SITE_OTHER): Payer: Medicare Other | Admitting: Family

## 2017-05-15 VITALS — BP 147/62 | HR 69 | Temp 98.2°F | Resp 18 | Ht 68.0 in | Wt 147.0 lb

## 2017-05-15 DIAGNOSIS — N183 Chronic kidney disease, stage 3 (moderate): Secondary | ICD-10-CM

## 2017-05-15 DIAGNOSIS — E785 Hyperlipidemia, unspecified: Secondary | ICD-10-CM

## 2017-05-15 DIAGNOSIS — Z794 Long term (current) use of insulin: Secondary | ICD-10-CM | POA: Diagnosis not present

## 2017-05-15 DIAGNOSIS — I10 Essential (primary) hypertension: Secondary | ICD-10-CM

## 2017-05-15 DIAGNOSIS — E1122 Type 2 diabetes mellitus with diabetic chronic kidney disease: Secondary | ICD-10-CM | POA: Diagnosis not present

## 2017-05-15 DIAGNOSIS — F329 Major depressive disorder, single episode, unspecified: Secondary | ICD-10-CM | POA: Diagnosis not present

## 2017-05-15 DIAGNOSIS — E039 Hypothyroidism, unspecified: Secondary | ICD-10-CM

## 2017-05-15 DIAGNOSIS — F32A Depression, unspecified: Secondary | ICD-10-CM

## 2017-05-15 NOTE — Patient Instructions (Signed)
Please continue current medications.

## 2017-05-15 NOTE — Progress Notes (Signed)
Subjective:    Patient ID: Jessica Meyer, female    DOB: 07/16/1935, 82 y.o.   MRN: [id number]  HPI  Jessica Meyer is a 82 yr old female who presents today for follow up.  1) HTN- maintained on amlodipine 64m, coreg, . Denies swelling/cp or sob.    BP Readings from Last 3 Encounters:  05/15/17 (!) 147/62  03/10/17 (!) 148/74  02/13/17 138/79   2) Hyperlipidemia- on lipitor.  Lab Results  Component Value Date   CHOL 170 11/07/2016   HDL 50.80 11/07/2016   LDLCALC 98 11/07/2016   TRIG 108.0 11/07/2016   CHOLHDL 3 11/07/2016   3) Hypothyroid- feels well on sythroid Lab Results  Component Value Date   TSH 3.13 02/13/2017   4)DM2- this is being followed by endocrinology (dr. GCruzita Lederer. Insulin dose was recently increased from 14-17 units.   Lab Results  Component Value Date   HGBA1C 8.3 03/10/2017   HGBA1C 8.2 11/10/2016   HGBA1C 7.6 08/11/2016   Lab Results  Component Value Date   MICROALBUR <0.7 05/11/2015   LDLCALC 98 11/07/2016   CREATININE 1.42 (H) 12/02/2016   Depression- continues wellbutrin.  Reports good mood. Daughter notes that the patient recently got new hearing aids and this has helped her to be more interactive.   Hyperlipidemia- maintained on lipitor.  Lab Results  Component Value Date   CHOL 170 11/07/2016   HDL 50.80 11/07/2016   LDLCALC 98 11/07/2016   TRIG 108.0 11/07/2016   CHOLHDL 3 11/07/2016    Review of Systems See HPI  Past Medical History:  Diagnosis Date  . Arthritis   . [place]   . History of chicken pox   . Hyperlipidemia   . Hypertension   . Thyroid disease    hypothyroid     Social History   Socioeconomic History  . Marital status: Single    Spouse name: Not on file  . Number of children: Not on file  . Years of education: Not on file  . Highest education level: Not on file  Occupational History  . Not on file  Social Needs  . Financial resource strain: Not on file  . Food  insecurity:    Worry: Not on file    Inability: Not on file  . Transportation needs:    Medical: Not on file    Non-medical: Not on file  Tobacco Use  . Smoking status: Never Smoker  . Smokeless tobacco: Current User    Types: Snuff  Substance and Sexual Activity  . Alcohol use: Yes    Alcohol/week: 0.6 oz    Types: 1 Standard drinks or equivalent per week    Comment: occasional beer  . Drug use: No  . Sexual activity: Not on file  Lifestyle  . Physical activity:    Days per week: Not on file    Minutes per session: Not on file  . Stress: Not on file  Relationships  . Social connections:    Talks on phone: Not on file    Gets together: Not on file    Attends religious service: Not on file    Active member of club or organization: Not on file    Attends meetings of clubs or organizations: Not on file    Relationship status: Not on file  . Intimate partner violence:    Fear of current or ex partner: Not on file    Emotionally abused: Not on file  Physically abused: Not on file    Forced sexual activity: Not on file  Other Topics Concern  . Not on file  Social History Narrative   Retired homemaker   Widow   Completed 7th grade   2 daughter   Jessica Meyer and daughter. Daughter local, son in [place]   4 grandchildren   Enjoys puzzles, gardening.    Past Surgical History:  Procedure Laterality Date  . ABDOMINAL HYSTERECTOMY    . HIP ARTHROPLASTY Right 01/26/2016   Procedure: ARTHROPLASTY BIPOLAR HIP (HEMIARTHROPLASTY);  Surgeon: Renette Butters, MD;  Location: [facility];  Service: Orthopedics;  Laterality: Right;  . KNEE ARTHROSCOPY Bilateral   . REPLACEMENT TOTAL KNEE BILATERAL     1998 left , 1992 right    Family History  Problem Relation Age of Onset  . Arthritis Mother   . Depression Mother   . Heart attack Father        79   . Hypertension Father     No Known Allergies  Current Outpatient Medications on File Prior to Visit  Medication Sig Dispense Refill  .  amLODipine (NORVASC) 5 MG tablet Take 1 tablet (5 mg total) by mouth daily. 90 tablet 3  . aspirin EC 81 MG tablet Take 81 mg by mouth daily.    Marland Kitchen atorvastatin (LIPITOR) 10 MG tablet Take 1 tablet (10 mg total) by mouth at bedtime. 90 tablet 3  . B-D UF III MINI PEN NEEDLES 31G X 5 MM MISC USE DAILY AS DIRECTED 100 each 1  . Blood Glucose Monitoring Suppl (ACCU-CHEK AVIVA PLUS) w/Device KIT USE AS DIRECTED 1 kit 0  . buPROPion (WELLBUTRIN) 100 MG tablet Take 1 tablet (100 mg total) 2 (two) times daily by mouth. 180 tablet 1  . carvedilol (COREG) 12.5 MG tablet TAKE 1 TABLET BY MOUTH TWO TIMES A DAY WITH MEAL 180 tablet 3  . glucose blood (ACCU-CHEK AVIVA PLUS) test strip USE TO TEST BLOOD SUGAR TWO TIMES A DAY  Dx. E11.22, N18.3, Z79.4 200 each 1  . hydrOXYzine (ATARAX/VISTARIL) 25 MG tablet Take 1 tablet (25 mg total) by mouth 2 (two) times daily as needed. 180 tablet 1  . Insulin Glargine (LANTUS SOLOSTAR) 100 UNIT/ML Solostar Pen INJECT 17 UNITS UNDER THE SKIN BEFORE LUNCH 15 mL 3  . Lancet Devices (ACCU-CHEK SOFTCLIX) lancets Use to check blood sugar twice a day.  DX E11.9 100 each 5  . levothyroxine (SYNTHROID, LEVOTHROID) 125 MCG tablet TAKE 1/2 TABLET (62.5 MCG TOTAL) BY MOUTH EVERY MORNING BEFORE BREAKFAST 45 tablet 1  . linagliptin (TRADJENTA) 5 MG TABS tablet Take 1 tablet (5 mg total) by mouth daily. 30 tablet 11  . lisinopril (PRINIVIL,ZESTRIL) 40 MG tablet TAKE ONE TABLET BY MOUTH DAILY 90 tablet 1   No current facility-administered medications on file prior to visit.     BP (!) 147/62 (BP Location: Right Arm, Cuff Size: Normal)   Pulse 69   Temp 98.2 F (36.8 C) (Oral)   Resp 18   Ht _0  (1.727 m)   Wt 147 lb (66.7 kg)   SpO2 99%   BMI 22.35 kg/m       Objective:   Physical Exam  Constitutional: She appears well-developed and well-nourished.  Cardiovascular: Normal rate, regular rhythm and normal heart sounds.  No murmur heard. Pulmonary/Chest: Effort normal and  breath sounds normal. No respiratory distress. She has no wheezes.  Psychiatric: She has a normal mood and affect. Her behavior is normal. Judgment and  thought content normal.          Assessment & Plan:  Hyperlipidemia- at goal on statin, continue same.   HTN- stable on current meds. Continue same.  DM2- clinically stable, management per endo.  Hypothyroid- stable on synthroid, continue same.    Depression- stable on wellbutrin, continue same.   I did counsel pt today on importance of dexa scan and fracture risk. She declines dexa scan.

## 2017-06-02 ENCOUNTER — Other Ambulatory Visit: Payer: Self-pay

## 2017-06-02 MED ORDER — ACCU-CHEK AVIVA PLUS W/DEVICE KIT: PACK | 0 refills | Status: DC

## 2017-06-24 ENCOUNTER — Other Ambulatory Visit: Payer: Self-pay | Admitting: Family

## 2017-06-28 ENCOUNTER — Telehealth: Payer: Self-pay | Admitting: *Deleted

## 2017-06-28 MED ORDER — HYDROXYZINE HCL 25 MG PO TABS: 25.0000 mg | ORAL_TABLET | Freq: Two times a day (BID) | ORAL | 1 refills | Status: DC | PRN

## 2017-06-28 MED ORDER — GLUCOSE BLOOD VI STRP: ORAL_STRIP | 1 refills | Status: DC

## 2017-06-28 NOTE — Telephone Encounter (Signed)
Received requests for hydroxyzine and accu-chek softclix lancets. Refills sent.

## 2017-06-29 ENCOUNTER — Ambulatory Visit: Payer: Medicare Other | Admitting: Internal Medicine

## 2017-07-05 MED ORDER — ACCU-CHEK SOFTCLIX LANCET DEV KIT: PACK | 0 refills | Status: DC

## 2017-07-05 NOTE — Telephone Encounter (Addendum)
Also received additional fax that pt needs a new lancing device. Rx has been sent.

## 2017-07-05 NOTE — Addendum Note (Signed)
Addended by: Mervin KungFERGERSON, Avyonna Wagoner A on: 07/05/2017 01:12 PM   Modules accepted: Orders

## 2017-07-11 MED ORDER — ACCU-CHEK SOFTCLIX LANCETS MISC: 1 refills | Status: DC

## 2017-07-11 NOTE — Addendum Note (Signed)
Addended by: Mervin KungFERGERSON, Eriko Economos A on: 07/11/2017 10:25 AM   Modules accepted: Orders

## 2017-07-31 ENCOUNTER — Ambulatory Visit: Payer: Medicare Other | Admitting: Internal Medicine

## 2017-08-28 ENCOUNTER — Ambulatory Visit: Payer: Medicare Other | Admitting: Family

## 2017-09-06 ENCOUNTER — Telehealth: Payer: Self-pay | Admitting: *Deleted

## 2017-09-06 MED ORDER — BD PEN NEEDLE MINI U/F 31G X 5 MM MISC: 1 refills | Status: DC

## 2017-09-06 NOTE — Telephone Encounter (Signed)
Received fax from [facility] for BD UF mini pen needle. Refill sent.

## 2017-09-29 ENCOUNTER — Telehealth: Payer: Self-pay

## 2017-09-29 DIAGNOSIS — I10 Essential (primary) hypertension: Secondary | ICD-10-CM

## 2017-09-29 DIAGNOSIS — E039 Hypothyroidism, unspecified: Secondary | ICD-10-CM

## 2017-09-29 MED ORDER — LISINOPRIL 40 MG PO TABS: 40.0000 mg | ORAL_TABLET | Freq: Every day | ORAL | 1 refills | Status: DC

## 2017-09-29 MED ORDER — LEVOTHYROXINE SODIUM 125 MCG PO TABS: ORAL_TABLET | ORAL | 1 refills | Status: DC

## 2017-09-29 NOTE — Telephone Encounter (Signed)
Received fax request for levothyroxine as well; refilled per protocol.

## 2017-09-29 NOTE — Addendum Note (Signed)
Addended by: Valentina Gu R on: 09/29/2017 02:52 PM   Modules accepted: Orders

## 2017-09-29 NOTE — Telephone Encounter (Signed)
Received fax from [facility] pharmacy requesting lisinopril refill. Med refilled per protocol.

## 2017-10-10 ENCOUNTER — Ambulatory Visit: Payer: Self-pay

## 2017-10-10 NOTE — Telephone Encounter (Signed)
Returned call to patient after daughter called and left message that her mother was having chest pain. I spoke to Jessica Meyer She states that she did have pain middle of breast left side. She denys radiating pain. She rates pain at mild and states "I haven't had pain for two days". She feels it was a muscle pull from cutting bushes in her yard. Pt denies recent cough. She denies nausea, sweating, SOB. She denies cardiac history.She denies lung disease. Per Protocol home care advice read to patient to include S/S of heart attack. Pt urged to call 911 or go to the nearest ED if pain returns.  Reason for Disposition . Chest pain(s) lasting a few seconds    Pain was over 1 week and has been gone for 2 days  Answer Assessment - Initial Assessment Questions 1. LOCATION: "Where does it hurt?"       Middle of breast left side 2. RADIATION: "Does the pain go anywhere else?" (e.g., into neck, jaw, arms, back)     no 3. ONSET: "When did the chest pain begin?" (Minutes, hours or days)      Last week 4. PATTERN "Does the pain come and go, or has it been constant since it started?"  "Does it get worse with exertion?"      Comes and goes 5. DURATION: "How long does it last" (e.g., seconds, minutes, hours)     No for 2 days 6. SEVERITY: "How bad is the pain?"  (e.g., Scale 1-10; mild, moderate, or severe)    - MILD (1-3): doesn't interfere with normal activities     - MODERATE (4-7): interferes with normal activities or awakens from sleep    - SEVERE (8-10): excruciating pain, unable to do any normal activities       mild 7. CARDIAC RISK FACTORS: "Do you have any history of heart problems or risk factors for heart disease?" (e.g., prior heart attack, angina; high blood pressure, diabetes, being overweight, high cholesterol, smoking, or strong family history of heart disease)     no 8. PULMONARY RISK FACTORS: "Do you have any history of lung disease?"  (e.g., blood clots in lung, asthma, emphysema, birth control  pills)     no 9. CAUSE: "What do you think is causing the chest pain?"     Cutting bushes 10. OTHER SYMPTOMS: "Do you have any other symptoms?" (e.g., dizziness, nausea, vomiting, sweating, fever, difficulty breathing, cough)       no 11. PREGNANCY: "Is there any chance you are pregnant?" "When was your last menstrual period?"       N/A  Protocols used: CHEST PAIN-A-AH

## 2017-10-18 ENCOUNTER — Encounter: Payer: Self-pay | Admitting: Family

## 2017-10-18 ENCOUNTER — Ambulatory Visit (INDEPENDENT_AMBULATORY_CARE_PROVIDER_SITE_OTHER): Payer: Medicare Other | Admitting: Family

## 2017-10-18 VITALS — BP 151/63 | HR 66 | Temp 97.8°F | Resp 16 | Ht 68.0 in | Wt 146.0 lb

## 2017-10-18 DIAGNOSIS — Z0000 Encounter for general adult medical examination without abnormal findings: Secondary | ICD-10-CM

## 2017-10-18 DIAGNOSIS — R0789 Other chest pain: Secondary | ICD-10-CM | POA: Diagnosis not present

## 2017-10-18 DIAGNOSIS — Z23 Encounter for immunization: Secondary | ICD-10-CM | POA: Diagnosis not present

## 2017-10-18 DIAGNOSIS — E785 Hyperlipidemia, unspecified: Secondary | ICD-10-CM | POA: Diagnosis not present

## 2017-10-18 DIAGNOSIS — N289 Disorder of kidney and ureter, unspecified: Secondary | ICD-10-CM

## 2017-10-18 DIAGNOSIS — Z794 Long term (current) use of insulin: Secondary | ICD-10-CM

## 2017-10-18 DIAGNOSIS — E119 Type 2 diabetes mellitus without complications: Secondary | ICD-10-CM | POA: Diagnosis not present

## 2017-10-18 DIAGNOSIS — F32A Depression, unspecified: Secondary | ICD-10-CM

## 2017-10-18 DIAGNOSIS — E039 Hypothyroidism, unspecified: Secondary | ICD-10-CM | POA: Diagnosis not present

## 2017-10-18 DIAGNOSIS — F329 Major depressive disorder, single episode, unspecified: Secondary | ICD-10-CM

## 2017-10-18 LAB — LIPID PANEL
CHOL/HDL RATIO: 3
Cholesterol: 139 mg/dL (ref 0–200)
HDL: 44.5 mg/dL (ref >39.00)
LDL Cholesterol: 58 mg/dL (ref 0–99)
NonHDL: 94.55
TRIGLYCERIDES: 184 mg/dL — AB (ref 0.0–149.0)
VLDL: 36.8 mg/dL (ref 0.0–40.0)

## 2017-10-18 LAB — COMPREHENSIVE METABOLIC PANEL
ALT: 14 U/L (ref 0–35)
AST: 16 U/L (ref 0–37)
Albumin: 4.5 g/dL (ref 3.5–5.2)
Alkaline Phosphatase: 79 U/L (ref 39–117)
BILIRUBIN TOTAL: 0.5 mg/dL (ref 0.2–1.2)
BUN: 26 mg/dL — ABNORMAL HIGH (ref 6–23)
CO2: 29 meq/L (ref 19–32)
Calcium: 10.6 mg/dL — ABNORMAL HIGH (ref 8.4–10.5)
Chloride: 102 mEq/L (ref 96–112)
Creatinine, Ser: 1.25 mg/dL — ABNORMAL HIGH (ref 0.40–1.20)
GFR: 43.84 mL/min — AB (ref >60.00)
Glucose, Bld: 139 mg/dL — ABNORMAL HIGH (ref 70–99)
Potassium: 4.8 mEq/L (ref 3.5–5.1)
Sodium: 138 mEq/L (ref 135–145)
Total Protein: 7.2 g/dL (ref 6.0–8.3)

## 2017-10-18 LAB — TSH: TSH: 3.45 u[IU]/mL (ref 0.35–4.50)

## 2017-10-18 LAB — HEMOGLOBIN A1C: Hgb A1c MFr Bld: 7.6 % — ABNORMAL HIGH (ref 4.6–6.5)

## 2017-10-18 MED ORDER — BUPROPION HCL ER (XL) 300 MG PO TB24: 300.0000 mg | ORAL_TABLET | ORAL | 2 refills | Status: DC

## 2017-10-18 MED ORDER — BUPROPION HCL ER (XL) 300 MG PO TB24: 300.0000 mg | ORAL_TABLET | Freq: Every day | ORAL | 2 refills | Status: DC

## 2017-10-18 MED ORDER — ZOSTER VAC RECOMB ADJUVANTED 50 MCG/0.5ML IM SUSR: INTRAMUSCULAR | 1 refills | Status: DC

## 2017-10-18 NOTE — Patient Instructions (Signed)
Go to the lab before leaving today.  Follow up in 4 weeks.

## 2017-10-18 NOTE — Progress Notes (Signed)
Subjective:    Jessica Meyer is a 82 y.o. female who presents for Medicare Annual/Subsequent preventive examination.  Preventive Screening-Counseling & Management  Tobacco Social History   Tobacco Use  Smoking Status Never Smoker  Smokeless Tobacco Current User  . Types: Snuff     Problems Prior to Visit 1. HTN- on lisinopril and coreg BP Readings from Last 3 Encounters:  10/18/17 (!) 151/63  05/15/17 (!) 147/62  03/10/17 (!) 148/74   2.  Hypothyroid- feels ok on current dose of synthroid.   Lab Results  Component Value Date   TSH 3.13 02/13/2017   3) Hyperlipidemia- tolerating statin without myalgia Lab Results  Component Value Date   CHOL 170 11/07/2016   HDL 50.80 11/07/2016   LDLCALC 98 11/07/2016   TRIG 108.0 11/07/2016   CHOLHDL 3 11/07/2016   4) DM2- following with endocrinology.  Lab Results  Component Value Date   HGBA1C 8.3 03/10/2017   HGBA1C 8.2 11/10/2016   HGBA1C 7.6 08/11/2016   Lab Results  Component Value Date   MICROALBUR <0.7 05/11/2015   LDLCALC 98 11/07/2016   CREATININE 1.42 (H) 12/02/2016    5) Depression/anxiety- maintained on wellbutrin. Reports that she does feel down sometimes.  Denies thoughts of suicide but notes that sometimes she wishes she would "die already."  Feels lonely because she lives alone. Sees her family about 2 x a week.   6) CKD-  Lab Results  Component Value Date   CREATININE 1.42 (H) 12/02/2016   Left sided chest pain- 3 days last week.  Not worsened by exercise. None today.   Current Problems (verified) Patient Active Problem List   Diagnosis Date Noted  . CKD stage G3b/A1, GFR 30-44 and albumin creatinine ratio <30 mg/g (HCC) 08/10/2016  . Left hip pain 06/14/2016  . Type 2 diabetes mellitus with stage 3 chronic kidney disease, with long-term current use of insulin ([place]) 01/29/2016  . Leukocytosis 01/25/2016  . Closed fracture of right hip ([place])   . Fall   . Left knee pain 05/28/2015  . Sprain of  hand, third finger, right 05/28/2015  . HTN (hypertension) 07/29/2014  . Gait instability 07/29/2014  . Hyperlipidemia 07/29/2014  . Right knee pain 04/11/2014  . ASNHL (asymmetrical sensorineural hearing loss) 08/07/2013  . 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 Hernia, rectovaginal 08/07/2013  . Hypercholesterolemia without hypertriglyceridemia 08/07/2013  . Arthritis of knee, degenerative 08/07/2013  . Degenerative arthritis of lumbar spine 08/07/2013  . Osteopenia 08/07/2013  . Adult hypothyroidism 08/07/2013  . History of artificial joint 08/07/2013  . Essential (primary) hypertension 08/07/2013  . Depression with anxiety 08/07/2013    Medications Prior to Visit Current Outpatient Medications on File Prior to Visit  Medication Sig Dispense Refill  . ACCU-CHEK SOFTCLIX LANCETS lancets Use as instructed to check blood sugar twice a day.  DX E11.9 200 each 1  . amLODipine (NORVASC) 5 MG tablet TAKE ONE TABLET BY MOUTH DAILY 90 tablet 2  . aspirin EC 81 MG tablet Take 81 mg by mouth daily.    Marland Kitchen atorvastatin (LIPITOR) 10 MG tablet Take 1 tablet (10 mg total) by mouth at bedtime. 90 tablet 3  . B-D UF III MINI PEN NEEDLES 31G X 5 MM MISC USE DAILY AS DIRECTED 100 each 1  . Blood Glucose Monitoring Suppl (ACCU-CHEK AVIVA PLUS) w/Device KIT USE AS DIRECTED 1 kit 0  . buPROPion (WELLBUTRIN) 100 MG tablet Take 1 tablet (100 mg total) 2 (two) times daily by mouth. [place]  tablet 1  . carvedilol (COREG) 12.5 MG tablet TAKE 1 TABLET BY MOUTH TWO TIMES A DAY WITH MEAL 180 tablet 3  . glucose blood (ACCU-CHEK AVIVA PLUS) test strip USE TO TEST BLOOD SUGAR TWO TIMES A DAY  Dx. E11.22, N18.3, Z79.4 200 each 1  . hydrOXYzine (ATARAX/VISTARIL) 25 MG tablet Take 1 tablet (25 mg total) by mouth 2 (two) times daily as needed. 180 tablet 1  . Insulin Glargine (LANTUS SOLOSTAR) 100 UNIT/ML Solostar Pen INJECT 17 UNITS UNDER THE SKIN BEFORE LUNCH 15 mL 3  . Lancets Misc. (ACCU-CHEK SOFTCLIX LANCET DEV) KIT Use to  check blood sugar twice a day.  DX E11.22, N18.3, Z79.4 1 kit 0  . levothyroxine (SYNTHROID, LEVOTHROID) 125 MCG tablet TAKE 1/2 TABLET (62.5 MCG TOTAL) BY MOUTH EVERY MORNING BEFORE BREAKFAST 45 tablet 1  . lisinopril (PRINIVIL,ZESTRIL) 40 MG tablet Take 1 tablet (40 mg total) by mouth daily. 90 tablet 1  . linagliptin (TRADJENTA) 5 MG TABS tablet Take 1 tablet (5 mg total) by mouth daily. (Patient not taking: Reported on 10/18/2017) 30 tablet 11   No current facility-administered medications on file prior to visit.     Current Medications (verified) Current Outpatient Medications  Medication Sig Dispense Refill  . ACCU-CHEK SOFTCLIX LANCETS lancets Use as instructed to check blood sugar twice a day.  DX E11.9 200 each 1  . amLODipine (NORVASC) 5 MG tablet TAKE ONE TABLET BY MOUTH DAILY 90 tablet 2  . aspirin EC 81 MG tablet Take 81 mg by mouth daily.    Marland Kitchen atorvastatin (LIPITOR) 10 MG tablet Take 1 tablet (10 mg total) by mouth at bedtime. 90 tablet 3  . B-D UF III MINI PEN NEEDLES 31G X 5 MM MISC USE DAILY AS DIRECTED 100 each 1  . Blood Glucose Monitoring Suppl (ACCU-CHEK AVIVA PLUS) w/Device KIT USE AS DIRECTED 1 kit 0  . buPROPion (WELLBUTRIN) 100 MG tablet Take 1 tablet (100 mg total) 2 (two) times daily by mouth. 180 tablet 1  . carvedilol (COREG) 12.5 MG tablet TAKE 1 TABLET BY MOUTH TWO TIMES A DAY WITH MEAL 180 tablet 3  . glucose blood (ACCU-CHEK AVIVA PLUS) test strip USE TO TEST BLOOD SUGAR TWO TIMES A DAY  Dx. E11.22, N18.3, Z79.4 200 each 1  . hydrOXYzine (ATARAX/VISTARIL) 25 MG tablet Take 1 tablet (25 mg total) by mouth 2 (two) times daily as needed. 180 tablet 1  . Insulin Glargine (LANTUS SOLOSTAR) 100 UNIT/ML Solostar Pen INJECT 17 UNITS UNDER THE SKIN BEFORE LUNCH 15 mL 3  . Lancets Misc. (ACCU-CHEK SOFTCLIX LANCET DEV) KIT Use to check blood sugar twice a day.  DX E11.22, N18.3, Z79.4 1 kit 0  . levothyroxine (SYNTHROID, LEVOTHROID) 125 MCG tablet TAKE 1/2 TABLET (62.5  MCG TOTAL) BY MOUTH EVERY MORNING BEFORE BREAKFAST 45 tablet 1  . lisinopril (PRINIVIL,ZESTRIL) 40 MG tablet Take 1 tablet (40 mg total) by mouth daily. 90 tablet 1  . linagliptin (TRADJENTA) 5 MG TABS tablet Take 1 tablet (5 mg total) by mouth daily. (Patient not taking: Reported on 10/18/2017) 30 tablet 11   No current facility-administered medications for this visit.      Allergies (verified) Patient has no known allergies.   PAST HISTORY  Family History Family History  Problem Relation Age of Onset  . Arthritis Mother   . Depression Mother   . Heart attack Father        63   . Hypertension Father  Social History Social History   Tobacco Use  . Smoking status: Never Smoker  . Smokeless tobacco: Current User    Types: Snuff  Substance Use Topics  . Alcohol use: Yes    Alcohol/week: 1.0 standard drinks    Types: 1 Standard drinks or equivalent per week    Comment: occasional beer     Are there smokers in your home (other than you)? No  Risk Factors Current exercise habits: walks on treadmill at home  Dietary issues discussed: does not eat vegetables often, soups/sandwiches, chicken     Cardiac risk factors: advanced age (older than 67 for men, 62 for women) and diabetes mellitus.  Depression Screen (Note: if answer to either of the following is "Yes", a more complete depression screening is indicated)   Over the past two weeks, have you felt down, depressed or hopeless? Yes  Over the past two weeks, have you felt little interest or pleasure in doing things? No  Have you lost interest or pleasure in daily life? No  Do you often feel hopeless? Yes  Do you cry easily over simple problems? Yes  Activities of Daily Living In your present state of health, do you have any difficulty performing the following activities?:  Driving? yes Managing money?  no Feeding yourself? no Getting from bed to chair? No  Climbing a flight of stairs? No Preparing food and  eating?: No Bathing or showering? No Getting dressed: No Getting to the toilet? No Using the toilet:No Moving around from place to place: No In the past year have you fallen or had a near fall?:No   Are you sexually active?  No  Do you have more than one partner?  No  Hearing Difficulties: Yes Do you often ask people to speak up or repeat themselves? Yes Do you experience ringing or noises in your ears? No Do you have difficulty understanding soft or whispered voices? No   Do you feel that you have a problem with memory? No  Do you often misplace items? No  Do you feel safe at home?  Yes  Cognitive Testing  Alert? Yes  Normal Appearance?Yes  Oriented to person? No  Place? No   Time? Yes  Recall of three objects?  Yes  Can perform simple calculations? Yes  Displays appropriate judgment?Yes  Can read the correct time from a watch face?Yes   Advanced Directives have been discussed with the patient? Yes  List the Names of Other Physician/Practitioners you currently use: 1.    Indicate any recent Medical Services you may have received from other than [facility] providers in the past year (date may be approximate).  Immunization History  Administered Date(s) Administered  . Influenza, High Dose Seasonal PF 10/13/2014, 09/14/2015, 11/07/2016  . Influenza-Unspecified 10/03/2013  . Pneumococcal Conjugate-13 10/03/2013  . Pneumococcal-Unspecified 01/03/2009  . Td 03/12/2007  . Tdap 01/04/2012  . Zoster 01/04/2011    Screening Tests Health Maintenance  Topic Date Due  . FOOT EXAM  07/18/2017  . INFLUENZA VACCINE  08/03/2017  . HEMOGLOBIN A1C  09/10/2017  . DEXA SCAN  05/16/2018 (Originally 12/09/2002)  . OPHTHALMOLOGY EXAM  04/10/2018  . TETANUS/TDAP  01/03/2022  . PNA vac Low Risk Adult  Completed    All answers were reviewed with the patient and necessary referrals were made:  Nance Pear, NP   10/18/2017   History reviewed: allergies, current medications, past  family history, past medical history, past social history, past surgical history and problem list  Review of Systems Pertinent items are noted in HPI.    Objective:   Body mass index is 22.2 kg/m. BP (!) 151/63 (BP Location: Left Arm, Patient Position: Sitting, Cuff Size: Small)   Pulse 66   Temp 97.8 F (36.6 C) (Oral)   Resp 16   Ht 5' 8"  (1.727 m)   Wt 146 lb (66.2 kg)   SpO2 98%   BMI 22.20 kg/m   Physical Exam  Constitutional: She is oriented to person, place, and time. She appears well-developed and well-nourished. No distress.  HENT:  Head: Normocephalic and atraumatic.  Right Ear: Tympanic membrane and ear canal normal.  Left Ear: Tympanic membrane and ear canal normal.  Mouth/Throat: Oropharynx is clear and moist.  Eyes: Pupils are equal, round, and reactive to light. No scleral icterus.  Neck: Normal range of motion. No thyromegaly present.  Cardiovascular: Normal rate and regular rhythm.   No murmur heard. Pulmonary/Chest: Effort normal and breath sounds normal. No respiratory distress. He has no wheezes. She has no rales. She exhibits no tenderness.  Abdominal: Soft. Bowel sounds are normal. She exhibits no distension and no mass. There is no tenderness. There is no rebound and no guarding.  Musculoskeletal: She exhibits no edema.  Lymphadenopathy:    She has no cervical adenopathy.  Neurological: She is alert and oriented to person, place, and time. She has normal patellar reflexes. She exhibits normal muscle tone. Coordination normal.  Skin: Skin is warm and dry.  Psychiatric: She has a normal mood and affect. Her behavior is normal. Judgment and thought content normal.  Breasts: Examined lying Right: Without masses, retractions, discharge or axillary adenopathy.  Left: Without masses, retractions, discharge or axillary adenopathy.  Breast/pelvic: deferred     Assessment & Plan:        Assessment:          Plan:  Atypical chest pain- EKG tracing  is personally reviewed.  EKG notes NSR.  No acute changes. Note is made of TWI in v1 and v3 which is not new when compared to prior ekg.  Will refer to cardiology for consult. She is advised to go to the ER if she develops recurrent Chest pain.  DM2- Sugar is acceptable for her age. Management per endo.  Lab Results  Component Value Date   HGBA1C 7.6 (H) 10/18/2017   HGBA1C 8.3 03/10/2017   HGBA1C 8.2 11/10/2016   Lab Results  Component Value Date   MICROALBUR <0.7 05/11/2015   LDLCALC 58 10/18/2017   CREATININE 1.25 (H) 10/18/2017   HTN- BP acceptable for her age, continue current meds.  BP Readings from Last 3 Encounters:  10/18/17 (!) 151/63  05/15/17 (!) 147/62  03/10/17 (!) 148/74   Hypothyroid- TSH stable, continue current dose of synthroid. Lab Results  Component Value Date   TSH 3.45 10/18/2017   Hyperlipidemia- LDL at goal, continue statin.  Lab Results  Component Value Date   CHOL 139 10/18/2017   HDL 44.50 10/18/2017   LDLCALC 58 10/18/2017   TRIG 184.0 (H) 10/18/2017   CHOLHDL 3 10/18/2017   CKD-  Lab Results  Component Value Date   CREATININE 1.25 (H) 10/18/2017   Cr stable, continue Ace for renal protection.  Depression- uncontrolled.  Will increase wellbutrin to 316m once daily. I did give her information on our counseling services.    During the course of the visit the patient was educated and counseled about appropriate screening and preventive services including:    Influenza vaccine  Screening electrocardiogram  Advanced directives: can't find advanced directive- gave copy on [place] form to fill/notorize and provide copy for her medical record.   Diet review for nutrition referral? Yes ____  Not Indicated _x___   Patient Instructions (the written plan) was given to the patient.  Medicare Attestation I have personally reviewed: The patient's medical and social history Their use of alcohol, tobacco or illicit drugs Their current medications  and supplements The patient's functional ability including ADLs,fall risks, home safety risks, cognitive, and hearing and visual impairment Diet and physical activities Evidence for depression or mood disorders  The patient's weight, height, BMI, and visual acuity have been recorded in the chart.  I have made referrals, counseling, and provided education to the patient based on review of the above and I have provided the patient with a written personalized care plan for preventive services.     Nance Pear, NP   10/18/2017      Patient ID: Jessica Meyer, female   DOB: June 13, 1935, 82 y.o.   MRN: [id number]

## 2017-10-19 ENCOUNTER — Telehealth: Payer: Self-pay | Admitting: Family

## 2017-10-19 ENCOUNTER — Other Ambulatory Visit (INDEPENDENT_AMBULATORY_CARE_PROVIDER_SITE_OTHER): Payer: Medicare Other

## 2017-10-19 DIAGNOSIS — Z23 Encounter for immunization: Secondary | ICD-10-CM

## 2017-10-19 NOTE — Progress Notes (Signed)
flu

## 2017-10-19 NOTE — Telephone Encounter (Signed)
Is she taking a calcium supplement?  If so please discontinue as calcium is high. I would like her to return to the lab in 2 weeks for follow up calcium.  Sugar and kidney function both look improved.

## 2017-10-20 NOTE — Telephone Encounter (Signed)
OK, then let's just repeat lab work in 2 weeks please.

## 2017-10-20 NOTE — Telephone Encounter (Signed)
Per patient's daughter, patient is not on calcium supplements. She doesn't take Tums or anything with a significant amount of calcium Please advise.

## 2017-10-22 ENCOUNTER — Encounter: Payer: Self-pay | Admitting: Family

## 2017-10-24 ENCOUNTER — Other Ambulatory Visit: Payer: Self-pay

## 2017-10-24 NOTE — Telephone Encounter (Signed)
Patient daughter given appointment to bring her back on 11-02-17 for serum calcium

## 2017-11-02 ENCOUNTER — Other Ambulatory Visit (INDEPENDENT_AMBULATORY_CARE_PROVIDER_SITE_OTHER): Payer: Medicare Other

## 2017-11-03 ENCOUNTER — Encounter: Payer: Self-pay | Admitting: Family

## 2017-11-03 LAB — PTH, INTACT AND CALCIUM
Calcium: 9.9 mg/dL (ref 8.6–10.4)
PTH: 34 pg/mL (ref 14–64)

## 2017-11-07 ENCOUNTER — Encounter: Payer: Self-pay | Admitting: Family

## 2017-11-07 DIAGNOSIS — R079 Chest pain, unspecified: Secondary | ICD-10-CM

## 2017-11-08 NOTE — Telephone Encounter (Signed)
Contacted patient's daughter.  Daughter reports that the patient was having some chest pain on Saturday after doing some yard work.  Daughter tried to encourage patient to go to the emergency department and patient declined.  Chest pain resolved.  She did go walking with her the following day patient did not have chest pain that day.  Advised daughter that I would place an urgent referral to cardiology.  We discussed that should she have recurrent chest pain that it is imperative that she go to the ED as her risk of having an underlying cardiac issue is high given her age and diabetes diagnosis.  Daughter verbalizes understanding.  I have asked her to let me know if she is not heard back about cardiology referral by this coming Monday.

## 2017-11-10 ENCOUNTER — Ambulatory Visit (INDEPENDENT_AMBULATORY_CARE_PROVIDER_SITE_OTHER): Payer: Medicare Other | Admitting: Cardiology

## 2017-11-10 ENCOUNTER — Encounter: Payer: Self-pay | Admitting: Cardiology

## 2017-11-10 VITALS — BP 130/60 | HR 72 | Ht 66.0 in | Wt 144.0 lb

## 2017-11-10 DIAGNOSIS — N183 Chronic kidney disease, stage 3 unspecified: Secondary | ICD-10-CM

## 2017-11-10 DIAGNOSIS — Z794 Long term (current) use of insulin: Secondary | ICD-10-CM

## 2017-11-10 DIAGNOSIS — E1122 Type 2 diabetes mellitus with diabetic chronic kidney disease: Secondary | ICD-10-CM | POA: Diagnosis not present

## 2017-11-10 DIAGNOSIS — I10 Essential (primary) hypertension: Secondary | ICD-10-CM | POA: Diagnosis not present

## 2017-11-10 DIAGNOSIS — R0789 Other chest pain: Secondary | ICD-10-CM | POA: Insufficient documentation

## 2017-11-10 DIAGNOSIS — E782 Mixed hyperlipidemia: Secondary | ICD-10-CM

## 2017-11-10 NOTE — Progress Notes (Signed)
Cardiology Consultation:    Date:  11/10/2017   ID:  [name], DOB 12/23/1935, MRN [id number]  PCP:  Debbrah Alar, NP  Cardiologist:  Jenne Campus, MD   Referring MD: Debbrah Alar, NP   Chief Complaint  Patient presents with  . Chest Pain  I have chest pain  History of Present Illness:    Jessica Meyer is a 82 y.o. female who is being seen today for the evaluation of chest pain at the request of Debbrah Alar, NP.  3 weeks ago she developed chest pain at rest initially on the left upper portion of the abdomen and then moved towards the chest she had difficult time to describe severity of the sensation she said was heavy pressure-like sensation lasted for 3 days straight.  There was no aggravating or relieving factors.  There is no shortness of breath no sweating associated with this sensation she thought she pulled out the muscles when she was cleaning some bushes in the garden however twisting or moving shoulder did not make this sensation worse then a few days later she started having similar pain that one lasted all day she went to her primary care physician and she was referred to [place] doing well today no problem she is 82 years old but doing quite well still very active works in the garden like to [employer].  Never had any heart trouble does have multiple risk factors for coronary artery disease that include diabetes hypertension dyslipidemia she never smoked but dips  Past Medical History:  Diagnosis Date  . Arthritis   . Diabetes mellitus without complication ([place])   . History of chicken pox   . Hyperlipidemia   . Hypertension   . Thyroid disease    hypothyroid    Past Surgical History:  Procedure Laterality Date  . ABDOMINAL HYSTERECTOMY    . HIP ARTHROPLASTY Right 01/26/2016   Procedure: ARTHROPLASTY BIPOLAR HIP (HEMIARTHROPLASTY);  Surgeon: Renette Butters, MD;  Location: [facility];  Service: Orthopedics;  Laterality: Right;  . KNEE  ARTHROSCOPY Bilateral   . REPLACEMENT TOTAL KNEE BILATERAL     1998 left , 1992 right    Current Medications: Current Meds  Medication Sig  . ACCU-CHEK SOFTCLIX LANCETS lancets Use as instructed to check blood sugar twice a day.  DX E11.9  . amLODipine (NORVASC) 5 MG tablet TAKE ONE TABLET BY MOUTH DAILY  . aspirin EC 81 MG tablet Take 81 mg by mouth daily.  Marland Kitchen atorvastatin (LIPITOR) 10 MG tablet Take 1 tablet (10 mg total) by mouth at bedtime.  . B-D UF III MINI PEN NEEDLES 31G X 5 MM MISC USE DAILY AS DIRECTED  . Blood Glucose Monitoring Suppl (ACCU-CHEK AVIVA PLUS) w/Device KIT USE AS DIRECTED  . buPROPion (WELLBUTRIN XL) 300 MG 24 hr tablet Take 1 tablet (300 mg total) by mouth 1 day or 1 dose for 1 dose.  . carvedilol (COREG) 12.5 MG tablet TAKE 1 TABLET BY MOUTH TWO TIMES A DAY WITH MEAL  . glucose blood (ACCU-CHEK AVIVA PLUS) test strip USE TO TEST BLOOD SUGAR TWO TIMES A DAY  Dx. E11.22, N18.3, Z79.4  . hydrOXYzine (ATARAX/VISTARIL) 25 MG tablet Take 1 tablet (25 mg total) by mouth 2 (two) times daily as needed.  . Insulin Glargine (LANTUS SOLOSTAR) 100 UNIT/ML Solostar Pen INJECT 17 UNITS UNDER THE SKIN BEFORE LUNCH  . Lancets Misc. (ACCU-CHEK SOFTCLIX LANCET DEV) KIT Use to check blood sugar twice a day.  DX E11.22,  N18.3, Z79.4  . levothyroxine (SYNTHROID, LEVOTHROID) 125 MCG tablet TAKE 1/2 TABLET (62.5 MCG TOTAL) BY MOUTH EVERY MORNING BEFORE BREAKFAST  . lisinopril (PRINIVIL,ZESTRIL) 40 MG tablet Take 1 tablet (40 mg total) by mouth daily.  Marland Kitchen Zoster Vaccine Adjuvanted [facility]) injection Inject 0.49m IM now and again in 2-6 months.     Allergies:   Patient has no known allergies.   Social History   Socioeconomic History  . Marital status: Single    Spouse name: Not on file  . Number of children: Not on file  . Years of education: Not on file  . Highest education level: Not on file  Occupational History  . Not on file  Social Needs  . Financial resource strain: Not  on file  . Food insecurity:    Worry: Not on file    Inability: Not on file  . Transportation needs:    Medical: Not on file    Non-medical: Not on file  Tobacco Use  . Smoking status: Never Smoker  . Smokeless tobacco: Current User    Types: Snuff  Substance and Sexual Activity  . Alcohol use: Yes    Alcohol/week: 1.0 standard drinks    Types: 1 Standard drinks or equivalent per week    Comment: occasional beer  . Drug use: No  . Sexual activity: Not on file  Lifestyle  . Physical activity:    Days per week: Not on file    Minutes per session: Not on file  . Stress: Not on file  Relationships  . Social connections:    Talks on phone: Not on file    Gets together: Not on file    Attends religious service: Not on file    Active member of club or organization: Not on file    Attends meetings of clubs or organizations: Not on file    Relationship status: Not on file  Other Topics Concern  . Not on file  Social History Narrative   Retired homemaker   Widow   Completed 7th grade   2 daughter   [place]. [place], [place]  4 grandchildren   Enjoys puzzles, gardening.     Family History: The patient's family history includes Arthritis in her mother; Depression in her mother; Heart attack in her father; Hypertension in her father. ROS:   Please see the history of present illness.    All 14 point review of systems negative except as described per history of present illness.  EKGs/Labs/Other Studies Reviewed:    The following studies were reviewed today:   EKG:  EKG is  ordered today.  The ekg ordered today demonstrates normal sinus rhythm normal P interval APCs nonspecific ST segment changes  Recent Labs: 12/02/2016: Hemoglobin 14.4; Platelets 228 10/18/2017: ALT 14; BUN 26; Creatinine, Ser 1.25; Potassium 4.8; Sodium 138; TSH 3.45  Recent Lipid Panel    Component Value Date/Time   CHOL 139 10/18/2017 1151   TRIG 184.0 (H) 10/18/2017 1151   HDL  44.50 10/18/2017 1151   CHOLHDL 3 10/18/2017 1151   VLDL 36.8 10/18/2017 1151   LDLCALC 58 10/18/2017 1151    Physical Exam:    VS:  BP 130/60   Pulse 72   Ht 5' 6" (1.676 m)   Wt 144 lb (65.3 kg)   SpO2 96%   BMI 23.24 kg/m     Wt Readings from Last 3 Encounters:  11/10/17 144 lb (65.3 kg)  10/18/17 146 lb (66.2  kg)  05/15/17 147 lb (66.7 kg)     GEN:  Well nourished, well developed in no acute distress HEENT: Normal NECK: No JVD; No carotid bruits LYMPHATICS: No lymphadenopathy CARDIAC: RRR, no murmurs, no rubs, no gallops RESPIRATORY:  Clear to auscultation without rales, wheezing or rhonchi  ABDOMEN: Soft, non-tender, non-distended MUSCULOSKELETAL:  No edema; No deformity  SKIN: Warm and dry NEUROLOGIC:  Alert and oriented x 3 PSYCHIATRIC:  Normal affect   ASSESSMENT:    1. Essential hypertension   2. Atypical chest pain   3. Type 2 diabetes mellitus with stage 3 chronic kidney disease, with long-term current use of insulin ([place])   4. Mixed hyperlipidemia    PLAN:    In order of problems listed above:  1. Atypical chest pain in this lady with multiple risk factors for coronary artery disease she is poor historian and is difficult to get precise history from her considering her multiple risk factors for coronary artery disease I think it would be reasonable to perform stress test.  We will schedule her to have stress echocardiogram.  As a part of evaluation I will also ask her to have an echocardiogram done.  She does have a systolic murmur on the physical exam that need to be clarified.  In the meantime we will maintain her on the present medication which include aspirin. 2. Diabetes mellitus followed by internal medicine team good control 3. Dyslipidemia on appropriate medication which I will continue.   Medication Adjustments/Labs and Tests Ordered: Current medicines are reviewed at length with the patient today.  Concerns regarding medicines are outlined  above.  No orders of the defined types were placed in this encounter.  No orders of the defined types were placed in this encounter.   Signed, Park Liter, MD, [facility]. 11/10/2017 3:37 PM    [facility]

## 2017-11-10 NOTE — Patient Instructions (Signed)
Medication Instructions:  Your physician recommends that you continue on your current medications as directed. Please refer to the Current Medication list given to you today.  If you need a refill on your cardiac medications before your next appointment, please call your pharmacy.   Lab work: None.  If you have labs (blood work) drawn today and your tests are completely normal, you will receive your results only by: Marland Kitchen MyChart Message (if you have MyChart) OR . A paper copy in the mail If you have any lab test that is abnormal or we need to change your treatment, we will call you to review the results.  Testing/Procedures: Your physician has requested that you have a stress echocardiogram. For further information please visit [email or web address] Please follow instruction sheet as given.  Your physician has requested that you have an echocardiogram. Echocardiography is a painless test that uses sound waves to create images of your heart. It provides your doctor with information about the size and shape of your heart and how well your heart's chambers and valves are working. This procedure takes approximately one hour. There are no restrictions for this procedure.    Follow-Up: At [facility], you and your health needs are our priority.  As part of our continuing mission to provide you with exceptional heart care, we have created designated Provider Care Teams.  These Care Teams include your primary Cardiologist (physician) and Advanced Practice Providers (APPs -  Physician Assistants and Nurse Practitioners) who all work together to provide you with the care you need, when you need it. You will need a follow up appointment in 1 months.  Please call our office 2 months in advance to schedule this appointment.  You may see No primary care provider on file. or another member of our [employer] Provider Team in [place]: Norman Herrlich, MD . Belva Crome, MD  Any Other Special  Instructions Will Be Listed Below (If Applicable).  Echocardiogram An echocardiogram, or echocardiography, uses sound waves (ultrasound) to produce an image of your heart. The echocardiogram is simple, painless, obtained within a short period of time, and offers valuable information to your health care provider. The images from an echocardiogram can provide information such as:  Evidence of coronary artery disease (CAD).  Heart size.  Heart muscle function.  Heart valve function.  Aneurysm detection.  Evidence of a past heart attack.  Fluid buildup around the heart.  Heart muscle thickening.  Assess heart valve function.  Tell a health care provider about:  Any allergies you have.  All medicines you are taking, including vitamins, herbs, eye drops, creams, and over-the-counter medicines.  Any problems you or family members have had with anesthetic medicines.  Any blood disorders you have.  Any surgeries you have had.  Any medical conditions you have.  Whether you are pregnant or may be pregnant. What happens before the procedure? No special preparation is needed. Eat and drink normally. What happens during the procedure?  In order to produce an image of your heart, gel will be applied to your chest and a wand-like tool (transducer) will be moved over your chest. The gel will help transmit the sound waves from the transducer. The sound waves will harmlessly bounce off your heart to allow the heart images to be captured in real-time motion. These images will then be recorded.  You may need an IV to receive a medicine that improves the quality of the pictures. What happens after the procedure? You may return to  your normal schedule including diet, activities, and medicines, unless your health care provider tells you otherwise. This information is not intended to replace advice given to you by your health care provider. Make sure you discuss any questions you have with your  health care provider. Document Released: 12/18/1999 Document Revised: 08/08/2015 Document Reviewed: 08/27/2012 Elsevier Interactive Patient Education  2017 Elsevier Inc.   Exercise Stress Echocardiogram An exercise stress echocardiogram is a test to check how well your heart is working. This test uses sound waves (ultrasound) and a computer to make images of your heart before and after exercise. Ultrasound images that are taken before you exercise (your resting echocardiogram) will show how much blood is getting to your heart muscle and how well your heart muscle and heart valves are functioning. During the next part of this test, you will walk on a treadmill or ride a stationary bike to see how exercise affects your heart. While you exercise, the electrical activity of your heart will be monitored with an electrocardiogram (ECG). Your blood pressure will also be monitored. You may have this test if you:  Have chest pain or other symptoms of a heart problem.  Recently had a heart attack or heart surgery.  Have heart valve problems.  Have a condition that causes narrowing of the blood vessels that supply your heart (coronary artery disease).  Have a high risk of heart disease and are starting a new exercise program.  Have a high risk of heart disease and need to have major surgery.  Tell a health care provider about:  Any allergies you have.  All medicines you are taking, including vitamins, herbs, eye drops, creams, and over-the-counter medicines.  Any problems you or family members have had with anesthetic medicines.  Any blood disorders you have.  Any surgeries you have had.  Any medical conditions you have.  Whether you are pregnant or may be pregnant. What are the risks? Generally, this is a safe procedure. However, problems may occur, including:  Chest pain.  Dizziness or light-headedness.  Shortness of breath.  Increased or irregular heartbeat  (palpitations).  Nausea or vomiting.  Heart attack (very rare).  What happens before the procedure?  Follow instructions from your health care provider about eating or drinking restrictions. You may be asked to avoid all forms of caffeine for 24 hours before your procedure, or as told by your health care provider.  Ask your health care provider about changing or stopping your regular medicines. This is especially important if you are taking diabetes medicines or blood thinners.  If you use an inhaler, bring it with you to the test.  Wear loose, comfortable clothing and walking shoes.  Do notuse any products that contain nicotine or tobacco, such as cigarettes and e-cigarettes, for 4 hours before the test or as told by your health care provider. If you need help quitting, ask your health care provider. What happens during the procedure?  You will take off your clothes from the waist up and put on a hospital gown.  A technician will place electrodes on your chest.  A blood pressure cuff will be placed on your arm.  You will lie down on a table for an ultrasound exam before you exercise. Gel will be rubbed on your chest, and a handheld device (transducer) will be pressed against your chest and moved over your heart.  Then, you will start exercising by walking on a treadmill or pedaling a stationary bicycle.  Your blood pressure and  heart rhythm will be monitored while you exercise.  The exercise will gradually get harder or faster.  You will exercise until: ? Your heart reaches a target level. ? You are too tired to continue. ? You cannot continue because of chest pain, weakness, or dizziness.  You will have another ultrasound exam after you stop exercising. The procedure may vary among health care providers and hospitals. What happens after the procedure?  Your heart rate and blood pressure will be monitored until they return to your normal levels. Summary  An exercise  stress echocardiogram is a test that uses ultrasound to check how well your heart works before and after exercise.  Before the test, follow instructions from your health care provider about stopping medications, avoiding nicotine and tobacco, and avoiding certain foods and drinks.  During the test, your blood pressure and heart rhythm will be monitored while you exercise on a treadmill or stationary bicycle. This information is not intended to replace advice given to you by your health care provider. Make sure you discuss any questions you have with your health care provider. Document Released: 12/25/2003 Document Revised: 08/12/2015 Document Reviewed: 08/12/2015 Elsevier Interactive Patient Education  2018 [employer].

## 2017-11-16 ENCOUNTER — Ambulatory Visit (HOSPITAL_BASED_OUTPATIENT_CLINIC_OR_DEPARTMENT_OTHER)
Admission: RE | Admit: 2017-11-16 | Discharge: 2017-11-16 | Disposition: A | Discharge status: Home / Self Care | Payer: Medicare Other | Source: Ambulatory Visit | Attending: Cardiology | Admitting: Cardiology

## 2017-11-16 DIAGNOSIS — R0789 Other chest pain: Secondary | ICD-10-CM | POA: Diagnosis not present

## 2017-11-16 NOTE — Progress Notes (Signed)
  Echocardiogram 2D Echocardiogram has been performed.  Jessica Meyer 11/16/2017, 1:25 PM

## 2017-11-20 ENCOUNTER — Ambulatory Visit (INDEPENDENT_AMBULATORY_CARE_PROVIDER_SITE_OTHER): Payer: Medicare Other | Admitting: Internal Medicine

## 2017-11-20 ENCOUNTER — Ambulatory Visit (HOSPITAL_BASED_OUTPATIENT_CLINIC_OR_DEPARTMENT_OTHER)
Admission: RE | Admit: 2017-11-20 | Discharge: 2017-11-20 | Disposition: A | Discharge status: Home / Self Care | Payer: Medicare Other | Source: Ambulatory Visit | Attending: Cardiology | Admitting: Cardiology

## 2017-11-20 ENCOUNTER — Encounter: Payer: Self-pay | Admitting: Internal Medicine

## 2017-11-20 VITALS — BP 126/78 | HR 75 | Ht 66.0 in | Wt 147.0 lb

## 2017-11-20 DIAGNOSIS — R0789 Other chest pain: Secondary | ICD-10-CM | POA: Diagnosis not present

## 2017-11-20 DIAGNOSIS — N183 Chronic kidney disease, stage 3 unspecified: Secondary | ICD-10-CM

## 2017-11-20 DIAGNOSIS — E1122 Type 2 diabetes mellitus with diabetic chronic kidney disease: Secondary | ICD-10-CM

## 2017-11-20 DIAGNOSIS — E7800 Pure hypercholesterolemia, unspecified: Secondary | ICD-10-CM | POA: Diagnosis not present

## 2017-11-20 DIAGNOSIS — Z794 Long term (current) use of insulin: Secondary | ICD-10-CM | POA: Diagnosis not present

## 2017-11-20 MED ORDER — GLIPIZIDE ER 2.5 MG PO TB24: 2.5000 mg | ORAL_TABLET | Freq: Every day | ORAL | 3 refills | Status: DC

## 2017-11-20 NOTE — Progress Notes (Signed)
  Echocardiogram Echocardiogram Stress Test has been performed.  Nealie Mchatton T Francesca Strome 11/20/2017, 9:21 AM

## 2017-11-20 NOTE — Progress Notes (Signed)
Patient ID: Jessica Meyer, female   DOB: Apr 16, 1935, 82 y.o.   MRN: [id number]   HPI: Jessica Meyer is a 82 y.o.-year-old female, returning for returning for follow-up for DM2, dx in 1990s, insulin-dependent since ~2012, uncontrolled, with complications (CKD stage 3-4). Last visit 8.5 months ago.  She is here with her daughter who offers most of the history as patient is mostly nonverbal.  Last hemoglobin A1c was: Lab Results  Component Value Date   HGBA1C 7.6 (H) 10/18/2017   HGBA1C 8.3 03/10/2017   HGBA1C 8.2 11/10/2016   Pt is on a regimen of: - Lantus 14 units at bedtime >> in a.m. >> 17 units in a.m. She was onTradjenta 5 mg daily before breakfast-added back 11/2016>> but stopped 08/2017 2/2 cost She was on Glipizide >> hypoglycemia.  Pt checks her sugars twice a day -per review of her meter downloads: - am: 109-167 >> 88-140 >> 106-196 >> 79, 96-167, 177 - 2h after b'fast: n/c >> 161-231 - before lunch:108-187, 215 >> 145-150 >> n/c - 2h after lunch: n/c >> 116, 118 >> n/c - before dinner: n/c >> 119, 185 - 2h after dinner: 86-152 >>  193-268 >> 179-295, 304, 406 - bedtime: 160-187 >> 235 - nighttime: n/c Lows: 27 x1 in 01/2016... >> 88 >> 116 >> 79; she has hypoglycemia awareness in the 90s. Highest sugar was 187 >> 268 >> 406.  Glucometer: Accu-Chek Aviva  Pt's meals are: - Breakfast: eggs + sausage and bacon - Lunch: can of soup + [place] sandwich - Dinner: canned green beans, baked potatoes, pork chops or [place] sandwich - Snacks: sandwich at bedtime (!)  -She has CKD stage III-IV, last BUN/creatinine:  Lab Results  Component Value Date   BUN 26 (H) 10/18/2017   BUN 29 (H) 12/02/2016   CREATININE 1.25 (H) 10/18/2017   CREATININE 1.42 (H) 12/02/2016   07/18/2016: GFR 35.37  Lab Results  Component Value Date   GFRNONAA 34 (L) 12/02/2016   GFRNONAA 38 (L) 01/26/2016   GFRNONAA 37 (L) 01/25/2016   GFRNONAA 32 (L) 11/07/2015   Lab Results  Component Value  Date   MICRALBCREAT 1.0 05/11/2015   MICRALBCREAT 1.1 07/29/2014  On lisinopril 10. -+ HL; last set of lipids: Lab Results  Component Value Date   CHOL 139 10/18/2017   HDL 44.50 10/18/2017   LDLCALC 58 10/18/2017   TRIG 184.0 (H) 10/18/2017   CHOLHDL 3 10/18/2017  On Lipitor 10. - last eye exam was in 04/2017: No DR - nonumbness and tingling in her feet.  She also has history of hypothyroidism, on levothyroxine, managed by PCP. Reviewed most recent TSH >> normal recently: Lab Results  Component Value Date   TSH 3.45 10/18/2017   She also has HTN, GERD  She has CP in last mo. Had recent cardiac investigation: Stress test 11/20/2017: normal 2D ECHO 11/16/2017: 1. Left ventricular systolic function is preserved visuallyestimated ejection fraction 60 to 65%. 2. Tiny pericardial effusion versus epicardial fat pad.  ROS: Constitutional: no weight gain/no weight loss, no fatigue, no subjective hyperthermia, no subjective hypothermia, + nocturia Eyes: no blurry vision, no xerophthalmia ENT: no sore throat, no nodules palpated in neck, no dysphagia, no odynophagia, no hoarseness Cardiovascular: + CP for last mo/no SOB/no palpitations/no leg swelling Respiratory: no cough/no SOB/no wheezing Gastrointestinal: no N/no V/no D/no C/no acid reflux Musculoskeletal: no muscle aches/no joint aches Skin: no rashes, no hair loss Neurological: no tremors/no numbness/no tingling/no dizziness  I reviewed pt's medications,  allergies, PMH, social hx, family hx, and changes were documented in the history of present illness. Otherwise, unchanged from my initial visit note.  Past Medical History:  Diagnosis Date  . Arthritis   . Diabetes mellitus without complication ([place])   . History of chicken pox   . Hyperlipidemia   . Hypertension   . Thyroid disease    hypothyroid   Past Surgical History:  Procedure Laterality Date  . ABDOMINAL HYSTERECTOMY    . HIP ARTHROPLASTY Right 01/26/2016    Procedure: ARTHROPLASTY BIPOLAR HIP (HEMIARTHROPLASTY);  Surgeon: Renette Butters, MD;  Location: [facility];  Service: Orthopedics;  Laterality: Right;  . KNEE ARTHROSCOPY Bilateral   . REPLACEMENT TOTAL KNEE BILATERAL     1998 left , 1992 right   Social History   Social History Main Topics  . Smoking status: Never Smoker  . Smokeless tobacco: Current User    Types: Snuff  . Alcohol use 0.6 oz/week    1 Standard drinks or equivalent per week     Comment: occasional beer, wine  . Drug use: No   Social History Narrative   Retired,  homemaker   Widow   Completed 7th grade   2 daughter   Son and daughter. Daughter local, son in [place]   4 grandchildren   Enjoys puzzles, gardening.      Current Outpatient Medications on File Prior to Visit  Medication Sig Dispense Refill  . ACCU-CHEK SOFTCLIX LANCETS lancets Use as instructed to check blood sugar twice a day.  DX E11.9 200 each 1  . amLODipine (NORVASC) 5 MG tablet TAKE ONE TABLET BY MOUTH DAILY 90 tablet 2  . aspirin EC 81 MG tablet Take 81 mg by mouth daily.    Marland Kitchen atorvastatin (LIPITOR) 10 MG tablet Take 1 tablet (10 mg total) by mouth at bedtime. 90 tablet 3  . B-D UF III MINI PEN NEEDLES 31G X 5 MM MISC USE DAILY AS DIRECTED 100 each 1  . Blood Glucose Monitoring Suppl (ACCU-CHEK AVIVA PLUS) w/Device KIT USE AS DIRECTED 1 kit 0  . buPROPion (WELLBUTRIN XL) 300 MG 24 hr tablet Take 1 tablet (300 mg total) by mouth 1 day or 1 dose for 1 dose. 90 tablet 2  . carvedilol (COREG) 12.5 MG tablet TAKE 1 TABLET BY MOUTH TWO TIMES A DAY WITH MEAL 180 tablet 3  . glucose blood (ACCU-CHEK AVIVA PLUS) test strip USE TO TEST BLOOD SUGAR TWO TIMES A DAY  Dx. E11.22, N18.3, Z79.4 200 each 1  . hydrOXYzine (ATARAX/VISTARIL) 25 MG tablet Take 1 tablet (25 mg total) by mouth 2 (two) times daily as needed. 180 tablet 1  . Insulin Glargine (LANTUS SOLOSTAR) 100 UNIT/ML Solostar Pen INJECT 17 UNITS UNDER THE SKIN BEFORE LUNCH 15 mL 3  . Lancets Misc.  (ACCU-CHEK SOFTCLIX LANCET DEV) KIT Use to check blood sugar twice a day.  DX E11.22, N18.3, Z79.4 1 kit 0  . levothyroxine (SYNTHROID, LEVOTHROID) 125 MCG tablet TAKE 1/2 TABLET (62.5 MCG TOTAL) BY MOUTH EVERY MORNING BEFORE BREAKFAST 45 tablet 1  . linagliptin (TRADJENTA) 5 MG TABS tablet Take 1 tablet (5 mg total) by mouth daily. (Patient not taking: Reported on 10/18/2017) 30 tablet 11  . lisinopril (PRINIVIL,ZESTRIL) 40 MG tablet Take 1 tablet (40 mg total) by mouth daily. 90 tablet 1  . Zoster Vaccine Adjuvanted [facility]) injection Inject 0.29m IM now and again in 2-6 months. 0.5 mL 1   No current facility-administered medications on file prior to  visit.    No Known Allergies Family History  Problem Relation Age of Onset  . Arthritis Mother   . Depression Mother   . Heart attack Father        43   . Hypertension Father    PE: BP 126/78   Pulse 75   Ht _0  (1.676 m) Comment: measured  Wt 147 lb (66.7 kg)   SpO2 95%   BMI 23.73 kg/m   Wt Readings from Last 3 Encounters:  11/20/17 147 lb (66.7 kg)  11/10/17 144 lb (65.3 kg)  10/18/17 146 lb (66.2 kg)   Constitutional: Normal weight, in NAD Eyes: PERRLA, EOMI, no exophthalmos ENT: moist mucous membranes, no thyromegaly, no cervical lymphadenopathy Cardiovascular: RRR, No MRG Respiratory: CTA B Gastrointestinal: abdomen soft, NT, ND, BS+ Musculoskeletal: no deformities, strength intact in all 4 Skin: moist, warm, no rashes Neurological: no tremor with outstretched hands, DTR normal in all 4  ASSESSMENT: 1. DM2, insulin-dependent, uncontrolled, with complications - CKD stage 3 >> worse lately - h/o hypoglycemia  2. HL  3.  Chest pain  PLAN:  1. Patient with long-standing, uncontrolled, type 2 diabetes, on long-acting insulin and Tradjenta.  She has a history of severe hypoglycemia so we have to be very careful with dosing of her insulin.  She had to come off Tradjenta in the past when she was in the donut hole.   Her sugars did not worsen afterwards, however, they started to increase during the day in a stepwise fashion consistent with insufficient mealtime coverage.  She is now back on Tradjenta before last visit.  The sugars were still high and they were increasing gradually during the day.  She was less active but planning to start working the garden as the weather improve.  At last visit, I advised her to increase the Lantus by 3 units.  We discussed that if she enters the donut hole again, we may need to replace Tradjenta with glipizide XL.  At this visit, she tells me that she was not able to continue Tradjenta but she did not let me know so we can start glipizide. -Last month, she had a new HbA1c performed and this is better, at 7.6% (but still above goal). -At today's visit, sugars are at or close to goal in the morning but they increase significantly towards the end of the day.  Highest blood sugar was in the 400s.  She is not sure why it increased that much. -I advised her to start a very low dose of glipizide extended release: 2.5 mg daily in a.m.  I advised her to let me know if she experiences any low blood sugars.  We also discussed that she can use a crushed glipizide before a larger meal, for example Thanksgiving dinner. - I suggested to:  Patient Instructions  Please continue: - Lantus 17 units in am  Please start: - Glipizide ER 2.5 mg before b'fast  Please return in 4 months with your sugar log.   - continue checking ugars at different times of the day - check 1x a day, rotating checks - advised for yearly eye exams >> she is UTD - Return to clinic in 3 mo with sugar log    2. HL - Reviewed latest lipid panel from 10/2017: LDL at goal, triglycerides high Lab Results  Component Value Date   CHOL 139 10/18/2017   HDL 44.50 10/18/2017   LDLCALC 58 10/18/2017   TRIG 184.0 (H) 10/18/2017   CHOLHDL 3  10/18/2017  - Continues Lipitor without side effects.  3. CP -Reviewed recent  investigation: Normal 2D echo and stress test with the exception of a small pericardial effusion -She continues to exercise on her treadmill at home  Philemon Kingdom, MD PhD [facility]

## 2017-11-20 NOTE — Patient Instructions (Addendum)
Please continue: - Lantus 17 units in am  Please start: - Glipizide ER 2.5 mg before b'fast  Please return in 4 months with your sugar log.

## 2017-11-21 ENCOUNTER — Other Ambulatory Visit (HOSPITAL_BASED_OUTPATIENT_CLINIC_OR_DEPARTMENT_OTHER): Payer: Medicare Other

## 2017-11-23 ENCOUNTER — Telehealth: Payer: Self-pay | Admitting: Emergency Medicine

## 2017-11-23 NOTE — Telephone Encounter (Signed)
Left message for patient's daughter to return call regarding results

## 2017-11-24 NOTE — Telephone Encounter (Signed)
Left results on daughter's voicemail per dpr  

## 2017-11-27 ENCOUNTER — Encounter: Payer: Self-pay | Admitting: Family

## 2017-11-27 ENCOUNTER — Ambulatory Visit (INDEPENDENT_AMBULATORY_CARE_PROVIDER_SITE_OTHER): Payer: Medicare Other | Admitting: Family

## 2017-11-27 VITALS — BP 146/80 | HR 77 | Temp 98.3°F | Ht 66.0 in | Wt 145.6 lb

## 2017-11-27 DIAGNOSIS — R202 Paresthesia of skin: Secondary | ICD-10-CM | POA: Diagnosis not present

## 2017-11-27 DIAGNOSIS — R200 Anesthesia of skin: Secondary | ICD-10-CM

## 2017-11-27 DIAGNOSIS — F329 Major depressive disorder, single episode, unspecified: Secondary | ICD-10-CM | POA: Diagnosis not present

## 2017-11-27 DIAGNOSIS — F32A Depression, unspecified: Secondary | ICD-10-CM

## 2017-11-27 MED ORDER — ESCITALOPRAM OXALATE 5 MG PO TABS: 5.0000 mg | ORAL_TABLET | Freq: Every day | ORAL | 1 refills | Status: DC

## 2017-11-27 NOTE — Patient Instructions (Signed)
Please start lexapro 5mg  once daily. Wear wrist brace every night while sleep and as able throughout the day.   Let me know if numbness worsens or if numbness does not improve with the brace.

## 2017-11-27 NOTE — Progress Notes (Signed)
Subjective:    Patient ID: Jessica Meyer, female    DOB: 1935/04/21, 82 y.o.   MRN: [id number]  HPI  Patient presents today for follow up. She is accompanied today by her daughter.   Depression- last visit we increased wellbutrin to 354m.  She reports feeling better since this was increased but still feels down sometimes.  "I even have to force myself to do puzzles and I have always loved doing puzzles."  Reports that she is sleeping well.  She also reports + hand numbness.  Notes that numbness is constant. Denies neck pain or pain radiating down the right arm.    Review of Systems See HPI  Past Medical History:  Diagnosis Date  . Arthritis   . Diabetes mellitus without complication ([place]   . History of chicken pox   . Hyperlipidemia   . Hypertension   . Thyroid disease    hypothyroid     Social History   Socioeconomic History  . Marital status: Single    Spouse name: Not on file  . Number of children: Not on file  . Years of education: Not on file  . Highest education level: Not on file  Occupational History  . Not on file  Social Needs  . Financial resource strain: Not on file  . Food insecurity:    Worry: Not on file    Inability: Not on file  . Transportation needs:    Medical: Not on file    Non-medical: Not on file  Tobacco Use  . Smoking status: Never Smoker  . Smokeless tobacco: Current User    Types: Snuff  Substance and Sexual Activity  . Alcohol use: Yes    Alcohol/week: 1.0 standard drinks    Types: 1 Standard drinks or equivalent per week    Comment: occasional beer  . Drug use: No  . Sexual activity: Not on file  Lifestyle  . Physical activity:    Days per week: Not on file    Minutes per session: Not on file  . Stress: Not on file  Relationships  . Social connections:    Talks on phone: Not on file    Gets together: Not on file    Attends religious service: Not on file    Active member of club or organization: Not on file   Attends meetings of clubs or organizations: Not on file    Relationship status: Not on file  . Intimate partner violence:    Fear of current or ex partner: Not on file    Emotionally abused: Not on file    Physically abused: Not on file    Forced sexual activity: Not on file  Other Topics Concern  . Not on file  Social History Narrative   Retired homemaker   Widow   Completed 7th grade   2 daughter   [place]andora Leiterand daughter. [place]  4 grandchildren   Enjoys puzzles, gardening.    Past Surgical History:  Procedure Laterality Date  . ABDOMINAL HYSTERECTOMY    . HIP ARTHROPLASTY Right 01/26/2016   Procedure: ARTHROPLASTY BIPOLAR HIP (HEMIARTHROPLASTY);  Surgeon: TRenette Butters MD;  [place]  Service: Orthopedics;  Laterality: Right;  . KNEE ARTHROSCOPY Bilateral   . REPLACEMENT TOTAL KNEE BILATERAL     1998 left , 1992 right    Family History  Problem Relation Age of Onset  . Arthritis Mother   . Depression Mother   . Heart  attack Father        30   . Hypertension Father     No Known Allergies  Current Outpatient Medications on File Prior to Visit  Medication Sig Dispense Refill  . ACCU-CHEK SOFTCLIX LANCETS lancets Use as instructed to check blood sugar twice a day.  DX E11.9 200 each 1  . amLODipine (NORVASC) 5 MG tablet TAKE ONE TABLET BY MOUTH DAILY 90 tablet 2  . aspirin EC 81 MG tablet Take 81 mg by mouth daily.    Marland Kitchen atorvastatin (LIPITOR) 10 MG tablet Take 1 tablet (10 mg total) by mouth at bedtime. 90 tablet 3  . B-D UF III MINI PEN NEEDLES 31G X 5 MM MISC USE DAILY AS DIRECTED 100 each 1  . Blood Glucose Monitoring Suppl (ACCU-CHEK AVIVA PLUS) w/Device KIT USE AS DIRECTED 1 kit 0  . buPROPion (WELLBUTRIN XL) 300 MG 24 hr tablet Take 1 tablet (300 mg total) by mouth 1 day or 1 dose for 1 dose. 90 tablet 2  . carvedilol (COREG) 12.5 MG tablet TAKE 1 TABLET BY MOUTH TWO TIMES A DAY WITH MEAL 180 tablet 3  . glipiZIDE (GLUCOTROL XL) 2.5 MG 24  hr tablet Take 1 tablet (2.5 mg total) by mouth daily before breakfast. 90 tablet 3  . glucose blood (ACCU-CHEK AVIVA PLUS) test strip USE TO TEST BLOOD SUGAR TWO TIMES A DAY  Dx. E11.22, N18.3, Z79.4 200 each 1  . hydrOXYzine (ATARAX/VISTARIL) 25 MG tablet Take 1 tablet (25 mg total) by mouth 2 (two) times daily as needed. 180 tablet 1  . Insulin Glargine (LANTUS SOLOSTAR) 100 UNIT/ML Solostar Pen INJECT 17 UNITS UNDER THE SKIN BEFORE LUNCH 15 mL 3  . Lancets Misc. (ACCU-CHEK SOFTCLIX LANCET DEV) KIT Use to check blood sugar twice a day.  DX E11.22, N18.3, Z79.4 1 kit 0  . levothyroxine (SYNTHROID, LEVOTHROID) 125 MCG tablet TAKE 1/2 TABLET (62.5 MCG TOTAL) BY MOUTH EVERY MORNING BEFORE BREAKFAST 45 tablet 1  . lisinopril (PRINIVIL,ZESTRIL) 40 MG tablet Take 1 tablet (40 mg total) by mouth daily. 90 tablet 1  . Zoster Vaccine Adjuvanted [facility]) injection Inject 0.98m IM now and again in 2-6 months. 0.5 mL 1   No current facility-administered medications on file prior to visit.     BP (!) 146/80 (BP Location: Right Arm, Patient Position: Sitting, Cuff Size: Normal)   Pulse 77   Temp 98.3 F (36.8 C) (Oral)   Ht _0  (1.676 m)   Wt 145 lb 9.6 oz (66 kg)   SpO2 100%   BMI 23.50 kg/m       Objective:   Physical Exam  Constitutional: She is oriented to person, place, and time. She appears well-developed and well-nourished.  Cardiovascular: Normal rate, regular rhythm and normal heart sounds.  No murmur heard. Pulmonary/Chest: Effort normal and breath sounds normal. No respiratory distress. She has no wheezes.  Neurological: She is alert and oriented to person, place, and time.  Bilateral hand grasps are 5/5  Skin: Skin is warm and dry.  Psychiatric: She has a normal mood and affect. Her behavior is normal. Judgment and thought content normal.          Assessment & Plan:  Depression- improved but still uncontrolled. Will continue current dose of wellbutrin and plan to add  lexapro 549monce daily.  R hand numbness- suspect carpal tunnel syndrome. Need to avoid NSAIDS due to renal insufficiency.  Prefer to avoid steroid injections due to DM2. Pt given  cock up wrist splint and advised to use it while sleeping and as able during the day. If symptoms worsen or fail to improve would plan referral to neurology.

## 2017-12-15 ENCOUNTER — Other Ambulatory Visit: Payer: Self-pay | Admitting: Family

## 2017-12-15 MED ORDER — CARVEDILOL 12.5 MG PO TABS: ORAL_TABLET | ORAL | 3 refills | Status: DC

## 2017-12-15 MED ORDER — ATORVASTATIN CALCIUM 10 MG PO TABS: 10.0000 mg | ORAL_TABLET | Freq: Every day | ORAL | 3 refills | Status: DC

## 2017-12-15 NOTE — Telephone Encounter (Signed)
Copied from CRM [id number]. Topic: Quick Communication - Rx Refill/Question >> Dec 15, 2017  2:44 PM Angela NevinWilliams, Candice N wrote: Medication: atorvastatin (LIPITOR) 10 MG tablet  and carvedilol (COREG) 12.5 MG tablet   Patient is requesting refills of these medications.   Preferred Pharmacy (with phone number or street name):[facility] [address] - [place], [place] - [address] [phone] (Phone) [phone] (Fax)

## 2017-12-22 ENCOUNTER — Ambulatory Visit: Payer: Medicare Other | Admitting: Cardiology

## 2018-01-08 ENCOUNTER — Ambulatory Visit: Payer: Medicare Other | Admitting: Family

## 2018-01-16 ENCOUNTER — Ambulatory Visit: Payer: Medicare Other | Admitting: Cardiology

## 2018-01-20 ENCOUNTER — Other Ambulatory Visit: Payer: Self-pay | Admitting: Family

## 2018-03-02 ENCOUNTER — Ambulatory Visit (INDEPENDENT_AMBULATORY_CARE_PROVIDER_SITE_OTHER): Payer: Medicare Other | Admitting: Family

## 2018-03-02 ENCOUNTER — Encounter: Payer: Self-pay | Admitting: Family

## 2018-03-02 VITALS — BP 131/61 | HR 76 | Temp 98.0°F | Resp 16 | Ht 66.0 in | Wt 148.6 lb

## 2018-03-02 DIAGNOSIS — R0989 Other specified symptoms and signs involving the circulatory and respiratory systems: Secondary | ICD-10-CM

## 2018-03-02 DIAGNOSIS — E782 Mixed hyperlipidemia: Secondary | ICD-10-CM

## 2018-03-02 DIAGNOSIS — I10 Essential (primary) hypertension: Secondary | ICD-10-CM

## 2018-03-02 DIAGNOSIS — F418 Other specified anxiety disorders: Secondary | ICD-10-CM

## 2018-03-02 DIAGNOSIS — M25551 Pain in right hip: Secondary | ICD-10-CM | POA: Diagnosis not present

## 2018-03-02 DIAGNOSIS — E039 Hypothyroidism, unspecified: Secondary | ICD-10-CM | POA: Diagnosis not present

## 2018-03-02 DIAGNOSIS — E7800 Pure hypercholesterolemia, unspecified: Secondary | ICD-10-CM | POA: Diagnosis not present

## 2018-03-02 NOTE — Progress Notes (Signed)
Subjective:    Patient ID: Jessica Meyer, female    DOB: October 13, 1935, 83 y.o.   MRN: [id number]  HPI  Patient presents today for follow up:  Depression- last visit we added low dose lexapro to her wellbutrin. Reports mood is improved on lexapro.   Hypothyroid- continues synthroid. Feels good on this dose.  Lab Results  Component Value Date   TSH 3.45 10/18/2017   Hip pain- reports that she tripped and hit her hip against a cabinet. Hit the back of her her right hip.  This occurred 2 weeks ago.  Reports sugar was 70 when she fell.  Reports that she is able to ambulate and pain is improving.   DM2- has follow up with Dr. Cruzita Lederer.   Lab Results  Component Value Date   HGBA1C 7.6 (H) 10/18/2017   HGBA1C 8.3 03/10/2017   HGBA1C 8.2 11/10/2016   Lab Results  Component Value Date   MICROALBUR <0.7 05/11/2015   LDLCALC 58 10/18/2017   CREATININE 1.25 (H) 10/18/2017   HTN- on amlodipine, lisinopril.  BP Readings from Last 3 Encounters:  03/02/18 131/61  11/27/17 (!) 146/80  11/20/17 126/78   Hyperlipidemia- on atorvastatin 10 mg.  Lab Results  Component Value Date   CHOL 139 10/18/2017   HDL 44.50 10/18/2017   LDLCALC 58 10/18/2017   TRIG 184.0 (H) 10/18/2017   CHOLHDL 3 10/18/2017    Review of Systems    see HPI  Past Medical History:  Diagnosis Date  . Arthritis   . Diabetes mellitus without complication ([place])   . History of chicken pox   . Hyperlipidemia   . Hypertension   . Thyroid disease    hypothyroid     Social History   Socioeconomic History  . Marital status: Widowed    Spouse name: Not on file  . Number of children: Not on file  . Years of education: Not on file  . Highest education level: Not on file  Occupational History  . Not on file  Social Needs  . Financial resource strain: Not on file  . Food insecurity:    Worry: Not on file    Inability: Not on file  . Transportation needs:    Medical: Not on file    Non-medical: Not on file   Tobacco Use  . Smoking status: Never Smoker  . Smokeless tobacco: Current User    Types: Snuff  Substance and Sexual Activity  . Alcohol use: Yes    Alcohol/week: 1.0 standard drinks    Types: 1 Standard drinks or equivalent per week    Comment: occasional beer  . Drug use: No  . Sexual activity: Not on file  Lifestyle  . Physical activity:    Days per week: Not on file    Minutes per session: Not on file  . Stress: Not on file  Relationships  . Social connections:    Talks on phone: Not on file    Gets together: Not on file    Attends religious service: Not on file    Active member of club or organization: Not on file    Attends meetings of clubs or organizations: Not on file    Relationship status: Not on file  . Intimate partner violence:    Fear of current or ex partner: Not on file    Emotionally abused: Not on file    Physically abused: Not on file    Forced sexual activity: Not on file  Other  Topics Concern  . Not on file  Social History Narrative   Retired homemaker   Widow   Completed 7th grade   2 daughter   [name] and daughter. Daughter local, son in [place]   4 grandchildren   Enjoys puzzles, gardening.    Past Surgical History:  Procedure Laterality Date  . ABDOMINAL HYSTERECTOMY    . HIP ARTHROPLASTY Right 01/26/2016   Procedure: ARTHROPLASTY BIPOLAR HIP (HEMIARTHROPLASTY);  Surgeon: Renette Butters, MD;  Location: [facility];  Service: Orthopedics;  Laterality: Right;  . KNEE ARTHROSCOPY Bilateral   . REPLACEMENT TOTAL KNEE BILATERAL     1998 left , 1992 right    Family History  Problem Relation Age of Onset  . Arthritis Mother   . Depression Mother   . Heart attack Father        53   . Hypertension Father     No Known Allergies  Current Outpatient Medications on File Prior to Visit  Medication Sig Dispense Refill  . ACCU-CHEK SOFTCLIX LANCETS lancets Use as instructed to check blood sugar twice a day.  DX E11.9 200 each 1  . amLODipine (NORVASC) 5  MG tablet TAKE ONE TABLET BY MOUTH DAILY 90 tablet 2  . aspirin EC 81 MG tablet Take 81 mg by mouth daily.    Marland Kitchen atorvastatin (LIPITOR) 10 MG tablet Take 1 tablet (10 mg total) by mouth at bedtime. 90 tablet 3  . B-D UF III MINI PEN NEEDLES 31G X 5 MM MISC USE DAILY AS DIRECTED 100 each 1  . Blood Glucose Monitoring Suppl (ACCU-CHEK AVIVA PLUS) w/Device KIT USE AS DIRECTED 1 kit 0  . buPROPion (WELLBUTRIN XL) 300 MG 24 hr tablet TAKE 1 TABLET BY MOUTH DAILY 90 tablet 1  . carvedilol (COREG) 12.5 MG tablet TAKE 1 TABLET BY MOUTH TWO TIMES A DAY WITH MEAL 180 tablet 3  . escitalopram (LEXAPRO) 5 MG tablet TAKE ONE TABLET BY MOUTH DAILY 30 tablet 5  . glipiZIDE (GLUCOTROL XL) 2.5 MG 24 hr tablet Take 1 tablet (2.5 mg total) by mouth daily before breakfast. 90 tablet 3  . glucose blood (ACCU-CHEK AVIVA PLUS) test strip USE TO TEST BLOOD SUGAR TWO TIMES A DAY  Dx. E11.22, N18.3, Z79.4 200 each 1  . hydrOXYzine (ATARAX/VISTARIL) 25 MG tablet TAKE 1 TABLET BY MOUTH 2 TIMES DAILY AS NEEDED 180 tablet 1  . Insulin Glargine (LANTUS SOLOSTAR) 100 UNIT/ML Solostar Pen INJECT 17 UNITS UNDER THE SKIN BEFORE LUNCH 15 mL 3  . Lancets Misc. (ACCU-CHEK SOFTCLIX LANCET DEV) KIT Use to check blood sugar twice a day.  DX E11.22, N18.3, Z79.4 1 kit 0  . levothyroxine (SYNTHROID, LEVOTHROID) 125 MCG tablet TAKE 1/2 TABLET (62.5 MCG TOTAL) BY MOUTH EVERY MORNING BEFORE BREAKFAST 45 tablet 1  . lisinopril (PRINIVIL,ZESTRIL) 40 MG tablet Take 1 tablet (40 mg total) by mouth daily. 90 tablet 1  . Zoster Vaccine Adjuvanted [facility]) injection Inject 0.37m IM now and again in 2-6 months. 0.5 mL 1   No current facility-administered medications on file prior to visit.     BP 131/61 (BP Location: Right Arm, Patient Position: Sitting, Cuff Size: Small)   Pulse 76   Temp 98 F (36.7 C) (Oral)   Resp 16   Ht _0  (1.676 m)   Wt 148 lb 9.6 oz (67.4 kg)   SpO2 99%   BMI 23.98 kg/m    Objective:   Physical  Exam Constitutional:      Appearance:  She is well-developed.  Neck:     Musculoskeletal: Neck supple.     Thyroid: No thyromegaly.  Cardiovascular:     Rate and Rhythm: Normal rate and regular rhythm.     Heart sounds: Normal heart sounds. No murmur.  Pulmonary:     Effort: Pulmonary effort is normal. No respiratory distress.     Breath sounds: Normal breath sounds. No wheezing.  Skin:    General: Skin is warm and dry.          Comments: Tenderness/ecchymosis noted overlying sacral area with mild swelling  Neurological:     Mental Status: She is alert and oriented to person, place, and time.  Psychiatric:        Behavior: Behavior normal.        Thought Content: Thought content normal.        Judgment: Judgment normal.    Diabetic Foot Exam - Simple   Simple Foot Form Diabetic Foot exam was performed with the following findings:  Yes 03/02/2018 10:31 AM  Visual Inspection No deformities, no ulcerations, no other skin breakdown bilaterally:  Yes Sensation Testing Intact to touch and monofilament testing bilaterally:  Yes Pulse Check See comments:  Yes Comments 1+ bilateral DP/PT pulses bilaterally           Assessment & Plan:  Hip pain- declines imaging, feels like pain is improving.  DM2- did have a low sugar but notes most of her sugars are in the 200's. Advised pt to keep appointment with endo.  HTN- bp is stable on current meds. Continue same.  R hip pain- suggested imaging to rule out fracture. Pt declines imaging.   Hyperlipidemia- LDL at goal, tolerating statin, continue same.  Hypothyroid- obtain follow up tsh.   Diminished LE pulses- had ABI 9/17 and saw vascular for this back in 2017- asymptomatic. Monitor.

## 2018-03-16 ENCOUNTER — Other Ambulatory Visit: Payer: Self-pay | Admitting: Internal Medicine

## 2018-03-19 ENCOUNTER — Other Ambulatory Visit: Payer: Self-pay | Admitting: Family

## 2018-03-19 DIAGNOSIS — E039 Hypothyroidism, unspecified: Secondary | ICD-10-CM

## 2018-03-19 DIAGNOSIS — I10 Essential (primary) hypertension: Secondary | ICD-10-CM

## 2018-03-23 ENCOUNTER — Ambulatory Visit: Payer: Medicare Other | Admitting: Internal Medicine

## 2018-04-04 ENCOUNTER — Emergency Department (HOSPITAL_COMMUNITY): Payer: Medicare Other

## 2018-04-04 ENCOUNTER — Encounter (HOSPITAL_COMMUNITY)
Admission: EM | Disposition: A | Discharge status: Rehabilitation Facility | Payer: Self-pay | Source: Home / Self Care | Attending: Neurology

## 2018-04-04 ENCOUNTER — Emergency Department (HOSPITAL_COMMUNITY): Payer: Medicare Other | Admitting: Certified Registered Nurse Anesthetist

## 2018-04-04 ENCOUNTER — Inpatient Hospital Stay (HOSPITAL_COMMUNITY)
Admission: EM | Admit: 2018-04-04 | Discharge: 2018-04-07 | DRG: 023 | Disposition: A | Discharge status: Rehabilitation Facility | Payer: Medicare Other | Attending: Neurology | Admitting: Neurology

## 2018-04-04 ENCOUNTER — Encounter (HOSPITAL_COMMUNITY): Payer: Self-pay | Admitting: *Deleted

## 2018-04-04 DIAGNOSIS — I6522 Occlusion and stenosis of left carotid artery: Secondary | ICD-10-CM | POA: Diagnosis present

## 2018-04-04 DIAGNOSIS — M792 Neuralgia and neuritis, unspecified: Secondary | ICD-10-CM | POA: Diagnosis not present

## 2018-04-04 DIAGNOSIS — Z7982 Long term (current) use of aspirin: Secondary | ICD-10-CM | POA: Diagnosis not present

## 2018-04-04 DIAGNOSIS — Z23 Encounter for immunization: Secondary | ICD-10-CM

## 2018-04-04 DIAGNOSIS — M4802 Spinal stenosis, cervical region: Secondary | ICD-10-CM | POA: Diagnosis present

## 2018-04-04 DIAGNOSIS — R531 Weakness: Secondary | ICD-10-CM | POA: Diagnosis not present

## 2018-04-04 DIAGNOSIS — F418 Other specified anxiety disorders: Secondary | ICD-10-CM | POA: Diagnosis present

## 2018-04-04 DIAGNOSIS — K5900 Constipation, unspecified: Secondary | ICD-10-CM | POA: Diagnosis present

## 2018-04-04 DIAGNOSIS — I69322 Dysarthria following cerebral infarction: Secondary | ICD-10-CM | POA: Diagnosis not present

## 2018-04-04 DIAGNOSIS — R414 Neurologic neglect syndrome: Secondary | ICD-10-CM | POA: Diagnosis not present

## 2018-04-04 DIAGNOSIS — R29710 NIHSS score 10: Secondary | ICD-10-CM | POA: Diagnosis present

## 2018-04-04 DIAGNOSIS — D72829 Elevated white blood cell count, unspecified: Secondary | ICD-10-CM | POA: Diagnosis present

## 2018-04-04 DIAGNOSIS — Z96653 Presence of artificial knee joint, bilateral: Secondary | ICD-10-CM | POA: Diagnosis present

## 2018-04-04 DIAGNOSIS — D62 Acute posthemorrhagic anemia: Secondary | ICD-10-CM | POA: Diagnosis not present

## 2018-04-04 DIAGNOSIS — I6601 Occlusion and stenosis of right middle cerebral artery: Secondary | ICD-10-CM | POA: Diagnosis present

## 2018-04-04 DIAGNOSIS — W0110XA Fall on same level from slipping, tripping and stumbling with subsequent striking against unspecified object, initial encounter: Secondary | ICD-10-CM | POA: Diagnosis present

## 2018-04-04 DIAGNOSIS — Y939 Activity, unspecified: Secondary | ICD-10-CM | POA: Diagnosis not present

## 2018-04-04 DIAGNOSIS — Z90710 Acquired absence of both cervix and uterus: Secondary | ICD-10-CM

## 2018-04-04 DIAGNOSIS — Z79899 Other long term (current) drug therapy: Secondary | ICD-10-CM

## 2018-04-04 DIAGNOSIS — G8114 Spastic hemiplegia affecting left nondominant side: Secondary | ICD-10-CM | POA: Diagnosis not present

## 2018-04-04 DIAGNOSIS — G8194 Hemiplegia, unspecified affecting left nondominant side: Secondary | ICD-10-CM | POA: Diagnosis present

## 2018-04-04 DIAGNOSIS — N183 Chronic kidney disease, stage 3 unspecified: Secondary | ICD-10-CM

## 2018-04-04 DIAGNOSIS — E785 Hyperlipidemia, unspecified: Secondary | ICD-10-CM | POA: Diagnosis present

## 2018-04-04 DIAGNOSIS — R402412 Glasgow coma scale score 13-15, at arrival to emergency department: Secondary | ICD-10-CM | POA: Diagnosis present

## 2018-04-04 DIAGNOSIS — I69391 Dysphagia following cerebral infarction: Secondary | ICD-10-CM

## 2018-04-04 DIAGNOSIS — I6621 Occlusion and stenosis of right posterior cerebral artery: Secondary | ICD-10-CM | POA: Diagnosis not present

## 2018-04-04 DIAGNOSIS — I129 Hypertensive chronic kidney disease with stage 1 through stage 4 chronic kidney disease, or unspecified chronic kidney disease: Secondary | ICD-10-CM | POA: Diagnosis present

## 2018-04-04 DIAGNOSIS — Z833 Family history of diabetes mellitus: Secondary | ICD-10-CM | POA: Diagnosis not present

## 2018-04-04 DIAGNOSIS — Q281 Other malformations of precerebral vessels: Secondary | ICD-10-CM | POA: Diagnosis not present

## 2018-04-04 DIAGNOSIS — R109 Unspecified abdominal pain: Secondary | ICD-10-CM | POA: Diagnosis not present

## 2018-04-04 DIAGNOSIS — M542 Cervicalgia: Secondary | ICD-10-CM

## 2018-04-04 DIAGNOSIS — I639 Cerebral infarction, unspecified: Secondary | ICD-10-CM

## 2018-04-04 DIAGNOSIS — Z823 Family history of stroke: Secondary | ICD-10-CM | POA: Diagnosis not present

## 2018-04-04 DIAGNOSIS — E1165 Type 2 diabetes mellitus with hyperglycemia: Secondary | ICD-10-CM | POA: Diagnosis present

## 2018-04-04 DIAGNOSIS — F17290 Nicotine dependence, other tobacco product, uncomplicated: Secondary | ICD-10-CM | POA: Diagnosis present

## 2018-04-04 DIAGNOSIS — H9190 Unspecified hearing loss, unspecified ear: Secondary | ICD-10-CM | POA: Diagnosis present

## 2018-04-04 DIAGNOSIS — E039 Hypothyroidism, unspecified: Secondary | ICD-10-CM | POA: Diagnosis present

## 2018-04-04 DIAGNOSIS — I63411 Cerebral infarction due to embolism of right middle cerebral artery: Secondary | ICD-10-CM | POA: Diagnosis not present

## 2018-04-04 DIAGNOSIS — R1310 Dysphagia, unspecified: Secondary | ICD-10-CM | POA: Diagnosis not present

## 2018-04-04 DIAGNOSIS — Y92019 Unspecified place in single-family (private) house as the place of occurrence of the external cause: Secondary | ICD-10-CM

## 2018-04-04 DIAGNOSIS — Z8249 Family history of ischemic heart disease and other diseases of the circulatory system: Secondary | ICD-10-CM

## 2018-04-04 DIAGNOSIS — R29898 Other symptoms and signs involving the musculoskeletal system: Secondary | ICD-10-CM | POA: Diagnosis not present

## 2018-04-04 DIAGNOSIS — E1122 Type 2 diabetes mellitus with diabetic chronic kidney disease: Secondary | ICD-10-CM | POA: Diagnosis present

## 2018-04-04 DIAGNOSIS — Z96641 Presence of right artificial hip joint: Secondary | ICD-10-CM | POA: Diagnosis present

## 2018-04-04 DIAGNOSIS — Z794 Long term (current) use of insulin: Secondary | ICD-10-CM

## 2018-04-04 DIAGNOSIS — I63131 Cerebral infarction due to embolism of right carotid artery: Secondary | ICD-10-CM | POA: Diagnosis present

## 2018-04-04 DIAGNOSIS — Z8261 Family history of arthritis: Secondary | ICD-10-CM

## 2018-04-04 DIAGNOSIS — E1151 Type 2 diabetes mellitus with diabetic peripheral angiopathy without gangrene: Secondary | ICD-10-CM | POA: Diagnosis not present

## 2018-04-04 DIAGNOSIS — Z79890 Hormone replacement therapy (postmenopausal): Secondary | ICD-10-CM | POA: Diagnosis not present

## 2018-04-04 DIAGNOSIS — I63511 Cerebral infarction due to unspecified occlusion or stenosis of right middle cerebral artery: Secondary | ICD-10-CM | POA: Diagnosis not present

## 2018-04-04 DIAGNOSIS — M1990 Unspecified osteoarthritis, unspecified site: Secondary | ICD-10-CM | POA: Diagnosis present

## 2018-04-04 DIAGNOSIS — I10 Essential (primary) hypertension: Secondary | ICD-10-CM | POA: Diagnosis not present

## 2018-04-04 DIAGNOSIS — R29810 Facial weakness: Secondary | ICD-10-CM | POA: Diagnosis present

## 2018-04-04 DIAGNOSIS — Z66 Do not resuscitate: Secondary | ICD-10-CM | POA: Diagnosis present

## 2018-04-04 DIAGNOSIS — I69354 Hemiplegia and hemiparesis following cerebral infarction affecting left non-dominant side: Secondary | ICD-10-CM | POA: Diagnosis not present

## 2018-04-04 DIAGNOSIS — F17220 Nicotine dependence, chewing tobacco, uncomplicated: Secondary | ICD-10-CM | POA: Diagnosis present

## 2018-04-04 DIAGNOSIS — R7309 Other abnormal glucose: Secondary | ICD-10-CM | POA: Diagnosis not present

## 2018-04-04 DIAGNOSIS — E119 Type 2 diabetes mellitus without complications: Secondary | ICD-10-CM | POA: Diagnosis not present

## 2018-04-04 DIAGNOSIS — Z7902 Long term (current) use of antithrombotics/antiplatelets: Secondary | ICD-10-CM | POA: Diagnosis not present

## 2018-04-04 HISTORY — PX: RADIOLOGY WITH ANESTHESIA: SHX6223

## 2018-04-04 LAB — CBC
HCT: 40.2 % (ref 36.0–46.0)
Hemoglobin: 12.8 g/dL (ref 12.0–15.0)
MCH: 30.2 pg (ref 26.0–34.0)
MCHC: 31.8 g/dL (ref 30.0–36.0)
MCV: 94.8 fL (ref 80.0–100.0)
Platelets: 240 10*3/uL (ref 150–400)
RBC: 4.24 MIL/uL (ref 3.87–5.11)
RDW: 12.7 % (ref 11.5–15.5)
WBC: 13.4 10*3/uL — ABNORMAL HIGH (ref 4.0–10.5)
nRBC: 0 % (ref 0.0–0.2)

## 2018-04-04 LAB — PROTIME-INR
INR: 1.1 (ref 0.8–1.2)
Prothrombin Time: 13.6 seconds (ref 11.4–15.2)

## 2018-04-04 LAB — DIFFERENTIAL
Abs Immature Granulocytes: 0.05 10*3/uL (ref 0.00–0.07)
Basophils Absolute: 0.1 10*3/uL (ref 0.0–0.1)
Basophils Relative: 0 %
Eosinophils Absolute: 0 10*3/uL (ref 0.0–0.5)
Eosinophils Relative: 0 %
Immature Granulocytes: 0 %
Lymphocytes Relative: 8 %
Lymphs Abs: 1.1 10*3/uL (ref 0.7–4.0)
Monocytes Absolute: 0.8 10*3/uL (ref 0.1–1.0)
Monocytes Relative: 6 %
Neutro Abs: 11.4 10*3/uL — ABNORMAL HIGH (ref 1.7–7.7)
Neutrophils Relative %: 86 %

## 2018-04-04 LAB — COMPREHENSIVE METABOLIC PANEL
ALT: 18 U/L (ref 0–44)
AST: 27 U/L (ref 15–41)
Albumin: 4.3 g/dL (ref 3.5–5.0)
Alkaline Phosphatase: 102 U/L (ref 38–126)
Anion gap: 13 (ref 5–15)
BUN: 17 mg/dL (ref 8–23)
CO2: 20 mmol/L — ABNORMAL LOW (ref 22–32)
Calcium: 9.9 mg/dL (ref 8.9–10.3)
Chloride: 102 mmol/L (ref 98–111)
Creatinine, Ser: 1.13 mg/dL — ABNORMAL HIGH (ref 0.44–1.00)
GFR calc Af Amer: 53 mL/min — ABNORMAL LOW (ref >60)
GFR calc non Af Amer: 46 mL/min — ABNORMAL LOW (ref >60)
Glucose, Bld: 247 mg/dL — ABNORMAL HIGH (ref 70–99)
Potassium: 4.1 mmol/L (ref 3.5–5.1)
Sodium: 135 mmol/L (ref 135–145)
Total Bilirubin: 1 mg/dL (ref 0.3–1.2)
Total Protein: 7.6 g/dL (ref 6.5–8.1)

## 2018-04-04 LAB — APTT: aPTT: 28 seconds (ref 24–36)

## 2018-04-04 LAB — I-STAT CREATININE, ED: Creatinine, Ser: 1 mg/dL (ref 0.44–1.00)

## 2018-04-04 LAB — CBG MONITORING, ED: Glucose-Capillary: 214 mg/dL — ABNORMAL HIGH (ref 70–99)

## 2018-04-04 LAB — ETHANOL: Alcohol, Ethyl (B): <10 mg/dL (ref <10)

## 2018-04-04 SURGERY — RADIOLOGY WITH ANESTHESIA
Anesthesia: General

## 2018-04-04 MED ORDER — NITROGLYCERIN 1 MG/10 ML FOR IR/CATH LAB
INTRA_ARTERIAL | Status: AC
  Filled 2018-04-04: qty 10

## 2018-04-04 MED ORDER — STROKE: EARLY STAGES OF RECOVERY BOOK
Freq: Once | Status: AC
  Administered 2018-04-05: 18:00:00

## 2018-04-04 MED ORDER — TIROFIBAN HCL IN NACL 5-0.9 MG/100ML-% IV SOLN
INTRAVENOUS | Status: AC
  Filled 2018-04-04: qty 100

## 2018-04-04 MED ORDER — SODIUM CHLORIDE 0.9 % IV SOLN
INTRAVENOUS | Status: DC | PRN
  Administered 2018-04-04: 20 ug/min via INTRAVENOUS
  Administered 2018-04-04: 15 ug/min via INTRAVENOUS

## 2018-04-04 MED ORDER — GLYCOPYRROLATE 0.2 MG/ML IJ SOLN
INTRAMUSCULAR | Status: DC | PRN
  Administered 2018-04-04 (×2): 0.1 mg via INTRAVENOUS

## 2018-04-04 MED ORDER — ACETAMINOPHEN 325 MG PO TABS: 650.0000 mg | ORAL_TABLET | ORAL | Status: DC | PRN

## 2018-04-04 MED ORDER — EPTIFIBATIDE 20 MG/10ML IV SOLN
INTRAVENOUS | Status: AC
  Administered 2018-04-04: 1.5 mg via INTRA_ARTERIAL
  Administered 2018-04-04: 23:00:00 3 mg via INTRA_ARTERIAL
  Administered 2018-04-05: 1.5 mg via INTRA_ARTERIAL
  Filled 2018-04-04: qty 10

## 2018-04-04 MED ORDER — ASPIRIN 81 MG PO CHEW
CHEWABLE_TABLET | ORAL | Status: AC
  Administered 2018-04-04: 23:00:00 81 mg via OROGASTRIC
  Filled 2018-04-04: qty 1

## 2018-04-04 MED ORDER — LIDOCAINE 2% (20 MG/ML) 5 ML SYRINGE
INTRAMUSCULAR | Status: DC | PRN
  Administered 2018-04-04: 70 mg via INTRAVENOUS

## 2018-04-04 MED ORDER — TICAGRELOR 90 MG PO TABS
ORAL_TABLET | ORAL | Status: AC
  Administered 2018-04-04: 23:00:00 180 mg via OROGASTRIC
  Filled 2018-04-04: qty 2

## 2018-04-04 MED ORDER — CLOPIDOGREL BISULFATE 300 MG PO TABS
ORAL_TABLET | ORAL | Status: AC
  Filled 2018-04-04: qty 1

## 2018-04-04 MED ORDER — ONDANSETRON HCL 4 MG/2ML IJ SOLN
INTRAMUSCULAR | Status: DC | PRN
  Administered 2018-04-04: 4 mg via INTRAVENOUS

## 2018-04-04 MED ORDER — CEFAZOLIN SODIUM-DEXTROSE 2-3 GM-%(50ML) IV SOLR
INTRAVENOUS | Status: DC | PRN
  Administered 2018-04-04: 2 g via INTRAVENOUS

## 2018-04-04 MED ORDER — ASPIRIN 325 MG PO TABS
325.0000 mg | ORAL_TABLET | Freq: Every day | ORAL | Status: DC
  Administered 2018-04-05: 12:00:00 325 mg via ORAL
  Filled 2018-04-04: qty 1

## 2018-04-04 MED ORDER — CEFAZOLIN SODIUM-DEXTROSE 2-4 GM/100ML-% IV SOLN
INTRAVENOUS | Status: AC
  Filled 2018-04-04: qty 100

## 2018-04-04 MED ORDER — IOHEXOL 350 MG/ML SOLN
140.0000 mL | Freq: Once | INTRAVENOUS | Status: AC | PRN
  Administered 2018-04-04: 22:00:00 140 mL via INTRAVENOUS

## 2018-04-04 MED ORDER — SODIUM CHLORIDE 0.9 % IV SOLN: INTRAVENOUS | Status: DC

## 2018-04-04 MED ORDER — LACTATED RINGERS IV SOLN
INTRAVENOUS | Status: DC | PRN
  Administered 2018-04-04 – 2018-04-05 (×2): via INTRAVENOUS

## 2018-04-04 MED ORDER — ASPIRIN 300 MG RE SUPP: 300.0000 mg | Freq: Every day | RECTAL | Status: DC

## 2018-04-04 MED ORDER — PROPOFOL 10 MG/ML IV BOLUS
INTRAVENOUS | Status: DC | PRN
  Administered 2018-04-04: 40 mg via INTRAVENOUS
  Administered 2018-04-04: 140 mg via INTRAVENOUS
  Administered 2018-04-04: 20 mg via INTRAVENOUS

## 2018-04-04 MED ORDER — ACETAMINOPHEN 160 MG/5ML PO SOLN: 650.0000 mg | ORAL | Status: DC | PRN

## 2018-04-04 MED ORDER — ROCURONIUM BROMIDE 50 MG/5ML IV SOSY
PREFILLED_SYRINGE | INTRAVENOUS | Status: DC | PRN
  Administered 2018-04-04 (×2): 20 mg via INTRAVENOUS
  Administered 2018-04-05 (×2): 10 mg via INTRAVENOUS

## 2018-04-04 MED ORDER — PHENYLEPHRINE HCL 10 MG/ML IJ SOLN
INTRAMUSCULAR | Status: DC | PRN
  Administered 2018-04-04 – 2018-04-05 (×3): 40 ug via INTRAVENOUS

## 2018-04-04 MED ORDER — SUCCINYLCHOLINE CHLORIDE 20 MG/ML IJ SOLN
INTRAMUSCULAR | Status: DC | PRN
  Administered 2018-04-04: 100 mg via INTRAVENOUS

## 2018-04-04 MED ORDER — ACETAMINOPHEN 650 MG RE SUPP: 650.0000 mg | RECTAL | Status: DC | PRN

## 2018-04-04 MED ORDER — ASPIRIN 325 MG PO TABS
ORAL_TABLET | ORAL | Status: AC
  Filled 2018-04-04: qty 1

## 2018-04-04 MED ORDER — ONDANSETRON HCL 4 MG/2ML IJ SOLN
INTRAMUSCULAR | Status: AC
  Administered 2018-04-04: 22:00:00
  Filled 2018-04-04: qty 2

## 2018-04-04 MED ORDER — EPHEDRINE SULFATE 50 MG/ML IJ SOLN
INTRAMUSCULAR | Status: DC | PRN
  Administered 2018-04-04: 5 mg via INTRAVENOUS

## 2018-04-04 NOTE — ED Notes (Addendum)
Pt was brought to CT on arrival to ED after cleared by ED. Pt had initial CT, then angio (had to be re scanned again because pt kept moving. Just after treatment decision, pt started vomiting, verbal order given by dr. Amada Jupiter for zofran dose, dose given. Treatment decision was made by neurologist at 2157. At 2205, dr. Bebe Shaggy came to CT to revaluate pt c spine, no additional ct needed. Pt was then transported to IR approx 2210. Pt daughter was brought to IR and treatment decision was explained. Pt belongings (clothing)  given to daughter.

## 2018-04-04 NOTE — Code Documentation (Signed)
Code stroke called at 2108, pt with L facial droop and L sided weakness LKW 1530. Pt arrived 2129 to CT, CTA/CTP repeated d/t pt constant movement during scan. LVO+, NIH on arrival 10. IR team notified at 2157. Foley placed, pulses marked, pt n/v 22m of zofran given and transferred to IR where pt met with daughter and Dr KLeonel Ramsaybriefly before proceeding. IR arrival 2210, Report given on arrival.

## 2018-04-04 NOTE — ED Provider Notes (Signed)
[facility] EMERGENCY DEPARTMENT Provider Note   CSN: [id number] Arrival date & time: 04/04/18  2129    History   Chief Complaint Chief complaint -  weakness  Level 5 caveat due to acuity of condition  HPI Jessica Meyer is a 83 y.o. female.    The history is provided by the patient and the EMS personnel. The history is limited by the condition of the patient.  Weakness  Severity:  Severe Onset quality:  Sudden Duration:  6 hours Timing:  Constant Progression:  Worsening Chronicity:  New Relieved by:  Nothing Worsened by:  Nothing Associated symptoms: falls    Patient presents from home for concern for stroke.  Last known well 1530 Patient reports she lightly fell onto her couch.  She reports left facial weakness and left arm weakness.  She reports mild headache from the fall.  Denies any pain at this time. Patient Arrived as a code stroke. Past Medical History:  Diagnosis Date   Arthritis    Diabetes mellitus without complication ([place])    History of chicken pox    Hyperlipidemia    Hypertension    Thyroid disease    hypothyroid    Patient Active Problem List   Diagnosis Date Noted   Atypical chest pain 11/10/2017   CKD stage G3b/A1, GFR 30-44 and albumin creatinine ratio <30 mg/g ([place]) 08/10/2016   Left hip pain 06/14/2016   Type 2 diabetes mellitus with stage 3 chronic kidney disease, with long-term current use of insulin ([place]) 01/29/2016   Leukocytosis 01/25/2016   Closed fracture of right hip ([place])    Fall    Left knee pain 05/28/2015   Sprain of hand, third finger, right 05/28/2015   HTN (hypertension) 07/29/2014   Gait instability 07/29/2014   Hyperlipidemia 07/29/2014   Right knee pain 04/11/2014   ASNHL (asymmetrical sensorineural hearing loss) 08/07/2013   6th nerve palsy 08/07/2013   Hernia, rectovaginal 08/07/2013   Hypercholesterolemia without hypertriglyceridemia 08/07/2013   Arthritis of knee,  degenerative 08/07/2013   Degenerative arthritis of lumbar spine 08/07/2013   Osteopenia 08/07/2013   Adult hypothyroidism 08/07/2013   History of artificial joint 08/07/2013   Essential (primary) hypertension 08/07/2013   Depression with anxiety 08/07/2013    Past Surgical History:  Procedure Laterality Date   ABDOMINAL HYSTERECTOMY     HIP ARTHROPLASTY Right 01/26/2016   Procedure: ARTHROPLASTY BIPOLAR HIP (HEMIARTHROPLASTY);  Surgeon: Renette Butters, MD;  Location: [facility];  Service: Orthopedics;  Laterality: Right;   KNEE ARTHROSCOPY Bilateral    REPLACEMENT TOTAL KNEE BILATERAL     1998 left , 1992 right     OB History   No obstetric history on file.      Home Medications    Prior to Admission medications   Medication Sig Start Date End Date Taking? Authorizing Provider  ACCU-CHEK SOFTCLIX LANCETS lancets Use as instructed to check blood sugar twice a day.  DX E11.9 07/11/17   Debbrah Alar, NP  amLODipine (NORVASC) 5 MG tablet TAKE 1 TABLET BY MOUTH DAILY 03/19/18   Debbrah Alar, NP  aspirin EC 81 MG tablet Take 81 mg by mouth daily.    [provider]  atorvastatin (LIPITOR) 10 MG tablet Take 1 tablet (10 mg total) by mouth at bedtime. 12/15/17   Debbrah Alar, NP  B-D UF III MINI PEN NEEDLES 31G X 5 MM MISC USE DAILY AS DIRECTED 09/06/17   Debbrah Alar, NP  Blood Glucose Monitoring Suppl (ACCU-CHEK AVIVA  PLUS) w/Device KIT USE AS DIRECTED 06/02/17   Debbrah Alar, NP  buPROPion (WELLBUTRIN XL) 300 MG 24 hr tablet TAKE 1 TABLET BY MOUTH DAILY 12/15/17   Debbrah Alar, NP  carvedilol (COREG) 12.5 MG tablet TAKE 1 TABLET BY MOUTH TWO TIMES A DAY WITH MEAL 12/15/17   Debbrah Alar, NP  escitalopram (LEXAPRO) 5 MG tablet TAKE ONE TABLET BY MOUTH DAILY 01/21/18   Debbrah Alar, NP  glipiZIDE (GLUCOTROL XL) 2.5 MG 24 hr tablet Take 1 tablet (2.5 mg total) by mouth daily before breakfast. 11/20/17   Philemon Kingdom,  MD  glucose blood (ACCU-CHEK AVIVA PLUS) test strip USE TO TEST BLOOD SUGAR TWO TIMES A DAY  Dx. E11.22, N18.3, Z79.4 06/28/17   Debbrah Alar, NP  hydrOXYzine (ATARAX/VISTARIL) 25 MG tablet TAKE ONE TABLET BY MOUTH TWO TIMES A DAY AS NEEDED 03/19/18   Debbrah Alar, NP  Insulin Glargine (LANTUS) 100 UNIT/ML Solostar Pen USE 17 UNTIS  UNDER THE SKIN BEFORE LUNCH AS DIRECTED 03/16/18   Philemon Kingdom, MD  Lancets Misc. (ACCU-CHEK SOFTCLIX LANCET DEV) KIT Use to check blood sugar twice a day.  DX E11.22, N18.3, Z79.4 07/05/17   Debbrah Alar, NP  levothyroxine (SYNTHROID, LEVOTHROID) 125 MCG tablet TAKE ONE HALF TABLET BY MOUTH EVERY MORNING BEFORE BREAKFAST 03/19/18   Debbrah Alar, NP  lisinopril (PRINIVIL,ZESTRIL) 40 MG tablet TAKE ONE TABLET BY MOUTH DAILY 03/19/18   Debbrah Alar, NP  Zoster Vaccine Adjuvanted [facility]) injection Inject 0.39m IM now and again in 2-6 months. 10/18/17   ODebbrah Alar NP    Family History Family History  Problem Relation Age of Onset   Arthritis Mother    Depression Mother    Heart attack Father        555   Hypertension Father     Social History Social History   Tobacco Use   Smoking status: Never Smoker   Smokeless tobacco: Current User    Types: Snuff  Substance Use Topics   Alcohol use: Yes    Alcohol/week: 1.0 standard drinks    Types: 1 Standard drinks or equivalent per week    Comment: occasional beer   Drug use: No     Allergies   Patient has no known allergies.   Review of Systems Review of Systems  Unable to perform ROS: Acuity of condition  Musculoskeletal: Positive for falls.  Neurological: Positive for weakness.     Physical Exam Updated Vital Signs BP 132/85 (BP Location: Left Arm)    Pulse 86    Temp 97.8 F (36.6 C) (Oral)    Resp 16    SpO2 100%   Physical Exam CONSTITUTIONAL: elderly ill appearing HEAD: Normocephalic/atraumatic EYES: EOMI/PERRL ENMT: Mucous membranes  moist SPINE/BACK: cervical spinal/paraspinal tenderness LUNGS: no apparent distress Chest - no tenderness or deformities ABDOMEN: soft, nontender GU:no cva tenderness NEURO: Pt is awake/alert. Left facial droop, see neuro note for full neuro exam EXTREMITIES: pulses normal/equal, full ROM, no deformities, pelvis appears stable SKIN: warm, color normal PSYCH: unable to assess   ED Treatments / Results  Labs (all labs ordered are listed, but only abnormal results are displayed) Labs Reviewed  CBC - Abnormal; Notable for the following components:      Result Value   WBC 13.4 (*)    All other components within normal limits  DIFFERENTIAL - Abnormal; Notable for the following components:   Neutro Abs 11.4 (*)    All other components within normal limits  COMPREHENSIVE METABOLIC PANEL -  Abnormal; Notable for the following components:   CO2 20 (*)    Glucose, Bld 247 (*)    Creatinine, Ser 1.13 (*)    GFR calc non Af Amer 46 (*)    GFR calc Af Amer 53 (*)    All other components within normal limits  CBG MONITORING, ED - Abnormal; Notable for the following components:   Glucose-Capillary 214 (*)    All other components within normal limits  ETHANOL  PROTIME-INR  APTT  RAPID URINE DRUG SCREEN, HOSP PERFORMED  URINALYSIS, ROUTINE W REFLEX MICROSCOPIC  HEMOGLOBIN A1C  LIPID PANEL  I-STAT CREATININE, ED    EKG None  Radiology Ct Angio Head W Or Wo Contrast  Result Date: 04/04/2018 CLINICAL DATA:  History of hypertension and hyperlipidemia. LEFT facial droop and LEFT extremity weakness. EXAM: CT ANGIOGRAPHY HEAD AND NECK CT PERFUSION BRAIN TECHNIQUE: Multidetector CT imaging of the head and neck was performed using the standard protocol during bolus administration of intravenous contrast. Multiplanar CT image reconstructions and MIPs were obtained to evaluate the vascular anatomy. Carotid stenosis measurements (when applicable) are obtained utilizing NASCET criteria, using the  distal internal carotid diameter as the denominator. Multiphase CT imaging of the brain was performed following IV bolus contrast injection. Subsequent parametric perfusion maps were calculated using RAPID software. CONTRAST:  156m OMNIPAQUE IOHEXOL 350 MG/ML SOLN COMPARISON:  CT HEAD April 04, 2018 at 2137 hours. FINDINGS: CTA NECK FINDINGS: AORTIC ARCH: Normal appearance of the thoracic arch, normal branch pattern. Mild calcific atherosclerosis aortic arch. The origins of the innominate, left Common carotid artery and subclavian artery are patent. Mild RIGHT and moderate LEFT subclavian artery stenosis due to atherosclerosis. RIGHT CAROTID SYSTEM: Common carotid artery is patent. Calcific atherosclerosis, occluded RIGHT internal carotid artery with thready faint recanalization. LEFT CAROTID SYSTEM: Common carotid artery is patent. Calcific atherosclerosis with short segment critical stenosis LEFT ICA origin. VERTEBRAL ARTERIES:Left vertebral artery is dominant. Severe stenosis bilateral vertebral artery origins. Patent vertebral arteries. SKELETON: No acute osseous process though bone windows have not been submitted. OTHER NECK: Soft tissues of the neck are nonacute though, not tailored for evaluation. C2-3 canal stenosis due to disc osteophyte complex. UPPER CHEST: Included lung apices are clear. No superior mediastinal lymphadenopathy. CTA HEAD FINDINGS: ANTERIOR CIRCULATION: RIGHT petrous 2 cavernous ICA occluded with minimal reconstitution at carotid siphon. LEFT internal carotid artery is patent with moderate stenosis supraclinoid segment. Patent bilateral carotid termini. Emergent RIGHT M1 occlusion with intermediate collateralization by single-phase CTA. Patent anterior cerebral arteries and LEFT MCA. Moderate tandem stenosis LEFT ACA. No  contrast extravasation or aneurysm. POSTERIOR CIRCULATION: Patent vertebral arteries, vertebrobasilar junction and basilar artery, as well as main branch vessels. Severe  stenosis RIGHT V4 segment. Patent posterior cerebral arteries, mild tandem stenoses compatible with atherosclerosis. No large vessel occlusion, flow-limiting stenosis, contrast extravasation or aneurysm. VENOUS SINUSES: Major dural venous sinuses are patent though not tailored for evaluation on this angiographic examination. ANATOMIC VARIANTS: None. DELAYED PHASE: Not performed. MIP images reviewed. CT Brain Perfusion Findings: Moderate motion degraded examination. CBF (<30%) Volume: 132mPerfusion (Tmax>6.0s) volume: 8675mismatch Volume: 69m69mfarction Location:RIGHT frontotemporal parietal penumbra with RIGHT basal ganglia, RIGHT periventricular and RIGHT temporal core infarcts. IMPRESSION: CTA NECK: 1. Occluded RIGHT ICA with trace reconstitution. 2. Critical stenosis LEFT ICA origin. 3. Severe stenosis bilateral vertebral artery origins, patent vertebral arteries. CTA HEAD: 1. Occluded RIGHT ICA with reconstitution at carotid terminus. Emergent RIGHT M1 occlusion with intermediate collaterals by single-phase CTA. CT PERFUSION: 1. Motion  degraded examination. Large area RIGHT MCA penumbra with 9 cc RIGHT basal ganglia and possible temporal lobe core infarcts. 2. Moderate stenoses LEFT ACA. Critical Value/emergent results text paged to Dr.MCNEILL [facility] via AMION secure system on 04/04/2018 at 10:08 pm, including interpreting physician's phone number. Acute findings discussed with and reconfirmed by Dr.MCNEILL [facility] on 04/04/2018 at 10:20 pm. Aortic Atherosclerosis (ICD10-I70.0). Electronically Signed   By: Elon Alas M.D.   On: 04/04/2018 22:21   Ct Angio Neck W Or Wo Contrast  Result Date: 04/04/2018 CLINICAL DATA:  History of hypertension and hyperlipidemia. LEFT facial droop and LEFT extremity weakness. EXAM: CT ANGIOGRAPHY HEAD AND NECK CT PERFUSION BRAIN TECHNIQUE: Multidetector CT imaging of the head and neck was performed using the standard protocol during bolus administration of  intravenous contrast. Multiplanar CT image reconstructions and MIPs were obtained to evaluate the vascular anatomy. Carotid stenosis measurements (when applicable) are obtained utilizing NASCET criteria, using the distal internal carotid diameter as the denominator. Multiphase CT imaging of the brain was performed following IV bolus contrast injection. Subsequent parametric perfusion maps were calculated using RAPID software. CONTRAST:  156m OMNIPAQUE IOHEXOL 350 MG/ML SOLN COMPARISON:  CT HEAD April 04, 2018 at 2137 hours. FINDINGS: CTA NECK FINDINGS: AORTIC ARCH: Normal appearance of the thoracic arch, normal branch pattern. Mild calcific atherosclerosis aortic arch. The origins of the innominate, left Common carotid artery and subclavian artery are patent. Mild RIGHT and moderate LEFT subclavian artery stenosis due to atherosclerosis. RIGHT CAROTID SYSTEM: Common carotid artery is patent. Calcific atherosclerosis, occluded RIGHT internal carotid artery with thready faint recanalization. LEFT CAROTID SYSTEM: Common carotid artery is patent. Calcific atherosclerosis with short segment critical stenosis LEFT ICA origin. VERTEBRAL ARTERIES:Left vertebral artery is dominant. Severe stenosis bilateral vertebral artery origins. Patent vertebral arteries. SKELETON: No acute osseous process though bone windows have not been submitted. OTHER NECK: Soft tissues of the neck are nonacute though, not tailored for evaluation. C2-3 canal stenosis due to disc osteophyte complex. UPPER CHEST: Included lung apices are clear. No superior mediastinal lymphadenopathy. CTA HEAD FINDINGS: ANTERIOR CIRCULATION: RIGHT petrous 2 cavernous ICA occluded with minimal reconstitution at carotid siphon. LEFT internal carotid artery is patent with moderate stenosis supraclinoid segment. Patent bilateral carotid termini. Emergent RIGHT M1 occlusion with intermediate collateralization by single-phase CTA. Patent anterior cerebral arteries and LEFT  MCA. Moderate tandem stenosis LEFT ACA. No  contrast extravasation or aneurysm. POSTERIOR CIRCULATION: Patent vertebral arteries, vertebrobasilar junction and basilar artery, as well as main branch vessels. Severe stenosis RIGHT V4 segment. Patent posterior cerebral arteries, mild tandem stenoses compatible with atherosclerosis. No large vessel occlusion, flow-limiting stenosis, contrast extravasation or aneurysm. VENOUS SINUSES: Major dural venous sinuses are patent though not tailored for evaluation on this angiographic examination. ANATOMIC VARIANTS: None. DELAYED PHASE: Not performed. MIP images reviewed. CT Brain Perfusion Findings: Moderate motion degraded examination. CBF (<30%) Volume: 139mPerfusion (Tmax>6.0s) volume: 8661mismatch Volume: 85m10mfarction Location:RIGHT frontotemporal parietal penumbra with RIGHT basal ganglia, RIGHT periventricular and RIGHT temporal core infarcts. IMPRESSION: CTA NECK: 1. Occluded RIGHT ICA with trace reconstitution. 2. Critical stenosis LEFT ICA origin. 3. Severe stenosis bilateral vertebral artery origins, patent vertebral arteries. CTA HEAD: 1. Occluded RIGHT ICA with reconstitution at carotid terminus. Emergent RIGHT M1 occlusion with intermediate collaterals by single-phase CTA. CT PERFUSION: 1. Motion degraded examination. Large area RIGHT MCA penumbra with 9 cc RIGHT basal ganglia and possible temporal lobe core infarcts. 2. Moderate stenoses LEFT ACA. Critical Value/emergent results text paged to [facility] AMION secure  system on 04/04/2018 at 10:08 pm, including interpreting physician's phone number. Acute findings discussed with and reconfirmed by Dr.MCNEILL [facility] on 04/04/2018 at 10:20 pm. Aortic Atherosclerosis (ICD10-I70.0). Electronically Signed   By: Elon Alas M.D.   On: 04/04/2018 22:21   Ct Cerebral Perfusion W Contrast  Result Date: 04/04/2018 CLINICAL DATA:  History of hypertension and hyperlipidemia. LEFT facial droop and  LEFT extremity weakness. EXAM: CT ANGIOGRAPHY HEAD AND NECK CT PERFUSION BRAIN TECHNIQUE: Multidetector CT imaging of the head and neck was performed using the standard protocol during bolus administration of intravenous contrast. Multiplanar CT image reconstructions and MIPs were obtained to evaluate the vascular anatomy. Carotid stenosis measurements (when applicable) are obtained utilizing NASCET criteria, using the distal internal carotid diameter as the denominator. Multiphase CT imaging of the brain was performed following IV bolus contrast injection. Subsequent parametric perfusion maps were calculated using RAPID software. CONTRAST:  147m OMNIPAQUE IOHEXOL 350 MG/ML SOLN COMPARISON:  CT HEAD April 04, 2018 at 2137 hours. FINDINGS: CTA NECK FINDINGS: AORTIC ARCH: Normal appearance of the thoracic arch, normal branch pattern. Mild calcific atherosclerosis aortic arch. The origins of the innominate, left Common carotid artery and subclavian artery are patent. Mild RIGHT and moderate LEFT subclavian artery stenosis due to atherosclerosis. RIGHT CAROTID SYSTEM: Common carotid artery is patent. Calcific atherosclerosis, occluded RIGHT internal carotid artery with thready faint recanalization. LEFT CAROTID SYSTEM: Common carotid artery is patent. Calcific atherosclerosis with short segment critical stenosis LEFT ICA origin. VERTEBRAL ARTERIES:Left vertebral artery is dominant. Severe stenosis bilateral vertebral artery origins. Patent vertebral arteries. SKELETON: No acute osseous process though bone windows have not been submitted. OTHER NECK: Soft tissues of the neck are nonacute though, not tailored for evaluation. C2-3 canal stenosis due to disc osteophyte complex. UPPER CHEST: Included lung apices are clear. No superior mediastinal lymphadenopathy. CTA HEAD FINDINGS: ANTERIOR CIRCULATION: RIGHT petrous 2 cavernous ICA occluded with minimal reconstitution at carotid siphon. LEFT internal carotid artery is  patent with moderate stenosis supraclinoid segment. Patent bilateral carotid termini. Emergent RIGHT M1 occlusion with intermediate collateralization by single-phase CTA. Patent anterior cerebral arteries and LEFT MCA. Moderate tandem stenosis LEFT ACA. No  contrast extravasation or aneurysm. POSTERIOR CIRCULATION: Patent vertebral arteries, vertebrobasilar junction and basilar artery, as well as main branch vessels. Severe stenosis RIGHT V4 segment. Patent posterior cerebral arteries, mild tandem stenoses compatible with atherosclerosis. No large vessel occlusion, flow-limiting stenosis, contrast extravasation or aneurysm. VENOUS SINUSES: Major dural venous sinuses are patent though not tailored for evaluation on this angiographic examination. ANATOMIC VARIANTS: None. DELAYED PHASE: Not performed. MIP images reviewed. CT Brain Perfusion Findings: Moderate motion degraded examination. CBF (<30%) Volume: 122mPerfusion (Tmax>6.0s) volume: 8628mismatch Volume: 38m30mfarction Location:RIGHT frontotemporal parietal penumbra with RIGHT basal ganglia, RIGHT periventricular and RIGHT temporal core infarcts. IMPRESSION: CTA NECK: 1. Occluded RIGHT ICA with trace reconstitution. 2. Critical stenosis LEFT ICA origin. 3. Severe stenosis bilateral vertebral artery origins, patent vertebral arteries. CTA HEAD: 1. Occluded RIGHT ICA with reconstitution at carotid terminus. Emergent RIGHT M1 occlusion with intermediate collaterals by single-phase CTA. CT PERFUSION: 1. Motion degraded examination. Large area RIGHT MCA penumbra with 9 cc RIGHT basal ganglia and possible temporal lobe core infarcts. 2. Moderate stenoses LEFT ACA. Critical Value/emergent results text p[facility] AMION secure system on 04/04/2018 at 10:08 pm, including interpreting physician's phone number. Acute findings discussed with and reconfirmed by Dr.MCNEILL [facility]2018 at 10:20 pm. Aortic Atherosclerosis (ICD10-I70.0).  Electronically Signed   By: CourElon Alas  M.D.   On: 04/04/2018 22:21   Ct Head Code Stroke Wo Contrast  Result Date: 04/04/2018 CLINICAL DATA:  Code stroke. Last seen normal 1530 hours. LEFT facial droop. LEFT extremity weakness. EXAM: CT HEAD WITHOUT CONTRAST TECHNIQUE: Contiguous axial images were obtained from the base of the skull through the vertex without intravenous contrast. COMPARISON:  None. FINDINGS: Brain: Mild to moderate atrophy, not unexpected for age. Ventriculomegaly, hydrocephalus ex vacuo. Hypoattenuation of white matter, both focal and confluent, likely chronic microvascular ischemic change, but no clear areas of cortical hypodensity. No hemorrhage, mass lesion, or extra-axial fluid. Vascular: Calcification of the cavernous internal carotid arteries and distal LEFT vertebral artery consistent with cerebrovascular atherosclerotic disease. Asymmetric hyperattenuation RIGHT MCA in the sylvian fissure, could reflect acute thrombus in one or both M2 MCA branches. Skull: Normal. Negative for fracture or focal lesion. Sinuses/Orbits: No acute finding. Other: None. ASPECTS [facility] Stroke Program Early CT Score) - Ganglionic level infarction (caudate, lentiform nuclei, internal capsule, insula, M1-M3 cortex): 7 - Supraganglionic infarction (M4-M6 cortex): 3 Total score (0-10 with 10 being normal): 10 IMPRESSION: 1. Asymmetric hyperattenuation RIGHT MCA in the Sylvian fissure could reflect acute thrombus in one or both M2 branches. 2. Atrophy and small vessel disease. 3. ASPECTS is 10. These results were communicated to Dr. Leonel Ramsay at 9:46 pmon 4/1/2020by text page via the [facility] messaging system. * Electronically Signed   By: Staci Righter M.D.   On: 04/04/2018 21:49    Procedures Procedures  CRITICAL CARE Performed by: Sharyon Cable Total critical care time: 30 minutes Critical care time was exclusive of separately billable procedures and treating other patients. Critical care  was necessary to treat or prevent imminent or life-threatening deterioration. Critical care was time spent personally by me on the following activities: development of treatment plan with patient and/or surrogate as well as nursing, discussions with consultants, evaluation of patient's response to treatment, examination of patient, obtaining history from patient or surrogate, ordering and performing treatments and interventions, ordering and review of laboratory studies, ordering and review of radiographic studies, pulse oximetry and re-evaluation of patient's condition.   Medications Ordered in ED Medications   stroke: mapping our early stages of recovery book (has no administration in time range)  0.9 %  sodium chloride infusion (has no administration in time range)  acetaminophen (TYLENOL) tablet 650 mg (has no administration in time range)    Or  acetaminophen (TYLENOL) solution 650 mg (has no administration in time range)    Or  acetaminophen (TYLENOL) suppository 650 mg (has no administration in time range)  aspirin suppository 300 mg (has no administration in time range)    Or  aspirin tablet 325 mg (has no administration in time range)  iohexol (OMNIPAQUE) 350 MG/ML injection 140 mL (140 mLs Intravenous Contrast Given 04/04/18 2151)  ondansetron (ZOFRAN) 4 MG/2ML injection (  Given 04/04/18 2221)     Initial Impression / Assessment and Plan / ED Course  I have reviewed the triage vital signs and the nursing notes.  Pertinent labs & imaging results that were available during my care of the patient were reviewed by me and considered in my medical decision making (see chart for details).        Patient seen on arrival as a code stroke.  This was seen in conjunction with Dr. Leonel Ramsay with neurology Patient had obvious left-sided facial droop. She reported pain everywhere but had no obvious signs of trauma.  A CT C-spine was ordered in conjunction with  her CT imaging. She was found  to have right M1 occlusion, and was sent emergently to the interventional radiology suite. This was discussed with neurology.  I reevaluated patient in the CT scanner prior to transfer to IR  Final Clinical Impressions(s) / ED Diagnoses   Final diagnoses:  Cerebrovascular accident (CVA), unspecified mechanism ([place])  Left-sided weakness    ED Discharge Orders    None       Ripley Fraise, MD 04/04/18 2241

## 2018-04-04 NOTE — Anesthesia Procedure Notes (Signed)
Arterial Line Insertion Start/End4/01/2018 12:24 PM, 04/04/2018 10:27 PM Performed by: Carmela Rima, CRNA, CRNA  Patient location: Pre-op. Preanesthetic checklist: patient identified, IV checked, site marked, risks and benefits discussed, surgical consent, monitors and equipment checked and pre-op evaluation Left, radial was placed Catheter size: 20 G Maximum sterile barriers used   Attempts: 1 Procedure performed without using ultrasound guided technique. Following insertion, Biopatch and dressing applied. Post procedure assessment: normal and unchanged  Patient tolerated the procedure well with no immediate complications.

## 2018-04-04 NOTE — Anesthesia Preprocedure Evaluation (Addendum)
Anesthesia Evaluation  Patient identified by MRN, date of birth, ID band Patient confused    Reviewed: Allergy & Precautions, NPO status , Patient's Chart, lab work & pertinent test results, reviewed documented beta blocker date and time , Unable to perform ROS - Chart review onlyPreop documentation limited or incomplete due to emergent nature of procedure.  Airway Mallampati: II  TM Distance: >3 FB Neck ROM: Full    Dental  (+) Dental Advisory Given, Edentulous Lower, Edentulous Upper   Pulmonary    breath sounds clear to auscultation- rhonchi       Cardiovascular hypertension, Pt. on medications and Pt. on home beta blockers  Rhythm:Regular Rate:Normal     Neuro/Psych PSYCHIATRIC DISORDERS Anxiety Depression  Neuromuscular disease CVA    GI/Hepatic negative GI ROS, Neg liver ROS,   Endo/Other  diabetes, Type 2, Oral Hypoglycemic AgentsHypothyroidism   Renal/GU Renal InsufficiencyRenal disease     Musculoskeletal  (+) Arthritis ,   Abdominal   Peds  Hematology   Anesthesia Other Findings   Reproductive/Obstetrics                            Anesthesia Physical Anesthesia Plan  ASA: IV and emergent  Anesthesia Plan: General   Post-op Pain Management:    Induction: Intravenous, Rapid sequence and Cricoid pressure planned  PONV Risk Score and Plan: 3 and Dexamethasone, Ondansetron and Treatment may vary due to age or medical condition  Airway Management Planned: Oral ETT  Additional Equipment: Arterial line  Intra-op Plan:   Post-operative Plan: Possible Post-op intubation/ventilation  Informed Consent: I have reviewed the patients History and Physical, chart, labs and discussed the procedure including the risks, benefits and alternatives for the proposed anesthesia with the patient or authorized representative who has indicated his/her understanding and acceptance.     Dental  advisory given  Plan Discussed with: CRNA  Anesthesia Plan Comments:         Anesthesia Quick Evaluation

## 2018-04-04 NOTE — H&P (Signed)
Neurology H&P  CC: Left-sided weakness  History is obtained from: Patient, daughter  HPI: Jessica Meyer is a 83 y.o. female with a history of hypertension, hyperlipidemia, diabetes who presents with left-sided weakness that started abruptly around 4 PM.  Patient's daughter talked to her around 3:30 PM, and then patient states that she fell down around 4 PM, and suspect it was her left-sided weakness that caused her to fall.  Her daughter tried to call her several times, and she was not answering her phone, and therefore around 9 PM her daughter went to check on her and found her on the ground.  EMS was activated and she was brought into the emergency department as a code stroke.  On arrival, it was noted that she had some neglect as well as left-sided weakness and therefore a CT angios/perfusion was performed.  The initial perfusion was complicated by significant movement artifact and therefore it was repeated demonstrated a large area of penumbra with relatively small core.  At baseline, she is completely independent and takes care of all of her own activities of daily living.  She lives alone.  LKW: 4 PM tpa given?: No, outside of window IR Thrombectomy?  Yes Modified Rankin Scale: 0-Completely asymptomatic and back to baseline post- stroke NIHSS:    ROS: A complete ROS was performed and is negative except as noted in the HPI.   Past Medical History:  Diagnosis Date  . Arthritis   . Diabetes mellitus without complication ([place])   . History of chicken pox   . Hyperlipidemia   . Hypertension   . Thyroid disease    hypothyroid     Family History  Problem Relation Age of Onset  . Arthritis Mother   . Depression Mother   . Heart attack Father        56   . Hypertension Father      Social History:  reports that she has never smoked. Her smokeless tobacco use includes snuff. She reports current alcohol use of about 1.0 standard drinks of alcohol per week. She reports that she  does not use drugs.   Prior to Admission medications   Medication Sig Start Date End Date Taking? Authorizing Provider  ACCU-CHEK SOFTCLIX LANCETS lancets Use as instructed to check blood sugar twice a day.  DX E11.9 07/11/17   Debbrah Alar, NP  amLODipine (NORVASC) 5 MG tablet TAKE 1 TABLET BY MOUTH DAILY 03/19/18   Debbrah Alar, NP  aspirin EC 81 MG tablet Take 81 mg by mouth daily.    [provider]  atorvastatin (LIPITOR) 10 MG tablet Take 1 tablet (10 mg total) by mouth at bedtime. 12/15/17   Debbrah Alar, NP  B-D UF III MINI PEN NEEDLES 31G X 5 MM MISC USE DAILY AS DIRECTED 09/06/17   Debbrah Alar, NP  Blood Glucose Monitoring Suppl (ACCU-CHEK AVIVA PLUS) w/Device KIT USE AS DIRECTED 06/02/17   Debbrah Alar, NP  buPROPion (WELLBUTRIN XL) 300 MG 24 hr tablet TAKE 1 TABLET BY MOUTH DAILY 12/15/17   Debbrah Alar, NP  carvedilol (COREG) 12.5 MG tablet TAKE 1 TABLET BY MOUTH TWO TIMES A DAY WITH MEAL 12/15/17   Debbrah Alar, NP  escitalopram (LEXAPRO) 5 MG tablet TAKE ONE TABLET BY MOUTH DAILY 01/21/18   Debbrah Alar, NP  glipiZIDE (GLUCOTROL XL) 2.5 MG 24 hr tablet Take 1 tablet (2.5 mg total) by mouth daily before breakfast. 11/20/17   Philemon Kingdom, MD  glucose blood (ACCU-CHEK AVIVA PLUS) test strip  USE TO TEST BLOOD SUGAR TWO TIMES A DAY  Dx. E11.22, N18.3, Z79.4 06/28/17   Debbrah Alar, NP  hydrOXYzine (ATARAX/VISTARIL) 25 MG tablet TAKE ONE TABLET BY MOUTH TWO TIMES A DAY AS NEEDED 03/19/18   Debbrah Alar, NP  Insulin Glargine (LANTUS) 100 UNIT/ML Solostar Pen USE 17 UNTIS  UNDER THE SKIN BEFORE LUNCH AS DIRECTED 03/16/18   Philemon Kingdom, MD  Lancets Misc. (ACCU-CHEK SOFTCLIX LANCET DEV) KIT Use to check blood sugar twice a day.  DX E11.22, N18.3, Z79.4 07/05/17   Debbrah Alar, NP  levothyroxine (SYNTHROID, LEVOTHROID) 125 MCG tablet TAKE ONE HALF TABLET BY MOUTH EVERY MORNING BEFORE BREAKFAST 03/19/18    Debbrah Alar, NP  lisinopril (PRINIVIL,ZESTRIL) 40 MG tablet TAKE ONE TABLET BY MOUTH DAILY 03/19/18   Debbrah Alar, NP  Zoster Vaccine Adjuvanted [facility] injection Inject 0.76m IM now and again in 2-6 months. 10/18/17   ODebbrah Alar NP     Exam: Current vital signs: BP 132/85 (BP Location: Left Arm)   Pulse 86   Temp 97.8 F (36.6 C) (Oral)   Resp 16   SpO2 100%    Physical Exam  Constitutional: Appears well-developed and well-nourished.  Psych: Affect appropriate to situation Eyes: No scleral injection HENT: No OP obstrucion Head: Normocephalic.  Cardiovascular: Normal rate and regular rhythm.  Respiratory: Effort normal and breath sounds normal to anterior ascultation GI: Soft.  No distension. There is no tenderness.  Skin: WDI  Neuro: Mental Status: Patient is awake, alert, oriented to person, place, month, year, and situation. Patient is able to give a clear and coherent history. No signs of aphasia, she has significant left-sided neglect Cranial Nerves: II: Visual Fields are full, though she extinguishes to double simultaneous visual stimulation pupils are equal, round, and reactive to light.   III,IV, VI: She has a right gaze preference, but does cross midline to the left V: Facial sensation is symmetric to temperature VII: Facial movement with left facial weakness VIII: hearing is intact to voice X: Uvula elevates symmetrically XI: Shoulder shrug is symmetric. XII: tongue is midline without atrophy or fasciculations.  Motor: Tone is normal. Bulk is normal. 5/5 strength was present on the right side, she has 3/5 weakness of the left arm and leg Sensory: Sensation is diminished on the left side Cerebellar: No clear ataxia   I have reviewed labs in epic and the pertinent results are: CMP-creatinine 1.13  I have reviewed the images obtained: CT angios/perfusion-86 cc penumbra with 13 cc core  Primary Diagnosis:  Cerebral infarction  due to embolism of  right middle cerebral artery.   Secondary Diagnosis: Essential (primary) hypertension, Type 2 diabetes mellitus with hyperglycemia  and CKD Stage 3 (GFR 30-59)   Impression: 83year old female presenting with right MCA occlusion secondary I suspect due to embolism from her right ICA which is also occluded.  She is undergoing emergent thrombectomy and will be admitted to the intensive care unit.  Plan: - HgbA1c, fasting lipid panel - MRI, MRA  of the brain without contrast - Frequent neuro checks - Echocardiogram - Carotid dopplers - Prophylactic therapy-likely aspirin and Plavix if stenting is done - Risk factor modification - Telemetry monitoring - PT consult, OT consult, Speech consult - Stroke team to follow   This patient is critically ill and at significant risk of neurological worsening, death and care requires constant monitoring of vital signs, hemodynamics,respiratory and cardiac monitoring, neurological assessment, discussion with family, other specialists and medical decision making of high complexity. I  spent 65 minutes of neurocritical care time  in the care of  this patient. This was time spent independent of any time provided by nurse practitioner or PA.  Roland Rack, MD [facility] [phone]  If 7pm- 7am, please page neurology on call as listed in [facility].

## 2018-04-04 NOTE — ED Notes (Signed)
Pt remains in CT at this time.  

## 2018-04-04 NOTE — ED Triage Notes (Signed)
Pt arrives via EMS from home. Per report, the pt had a fall (pt reports she started feeling weak around 1600 today). She says she fell, reports she has a headache because she hit her head. Pt has left sided facial dropping, left upper and left lower extremity weakness. En route to ED, vitals 170/91, r 16, p 92, cbg 257. Hx htn, diabetes.

## 2018-04-04 NOTE — Anesthesia Procedure Notes (Signed)
Procedure Name: Intubation Date/Time: 04/04/2018 10:41 PM Performed by: Inda Coke, CRNA Pre-anesthesia Checklist: Patient identified, Emergency Drugs available, Suction available and Patient being monitored Patient Re-evaluated:Patient Re-evaluated prior to induction Oxygen Delivery Method: Circle System Utilized Preoxygenation: Pre-oxygenation with 100% oxygen Induction Type: IV induction and Rapid sequence Laryngoscope Size: Mac and 4 Grade View: Grade I Tube type: Oral Tube size: 7.5 mm Number of attempts: 1 Airway Equipment and Method: Stylet and Oral airway Placement Confirmation: ETT inserted through vocal cords under direct vision,  positive ETCO2 and breath sounds checked- equal and bilateral Secured at: 22 cm Tube secured with: Tape Dental Injury: Teeth and Oropharynx as per pre-operative assessment

## 2018-04-04 NOTE — ED Notes (Signed)
I stat creatinine reported to neurologist, pt remains in ct

## 2018-04-05 ENCOUNTER — Inpatient Hospital Stay (HOSPITAL_COMMUNITY): Payer: Medicare Other

## 2018-04-05 ENCOUNTER — Telehealth (HOSPITAL_COMMUNITY): Payer: Self-pay | Admitting: *Deleted

## 2018-04-05 ENCOUNTER — Encounter (HOSPITAL_COMMUNITY): Payer: Self-pay | Admitting: Radiology

## 2018-04-05 DIAGNOSIS — I6601 Occlusion and stenosis of right middle cerebral artery: Secondary | ICD-10-CM | POA: Diagnosis present

## 2018-04-05 DIAGNOSIS — I639 Cerebral infarction, unspecified: Secondary | ICD-10-CM

## 2018-04-05 HISTORY — PX: IR CT HEAD LTD: IMG2386

## 2018-04-05 HISTORY — PX: IR ANGIO EXTRACRAN SEL COM CAROTID INNOMINATE UNI L MOD SED: IMG5355

## 2018-04-05 HISTORY — PX: IR PERCUTANEOUS ART THROMBECTOMY/INFUSION INTRACRANIAL INC DIAG ANGIO: IMG6087

## 2018-04-05 LAB — CBC WITH DIFFERENTIAL/PLATELET
Abs Immature Granulocytes: 0.05 10*3/uL (ref 0.00–0.07)
Basophils Absolute: 0 10*3/uL (ref 0.0–0.1)
Basophils Relative: 0 %
Eosinophils Absolute: 0 10*3/uL (ref 0.0–0.5)
Eosinophils Relative: 0 %
HCT: 33.9 % — ABNORMAL LOW (ref 36.0–46.0)
Hemoglobin: 11 g/dL — ABNORMAL LOW (ref 12.0–15.0)
Immature Granulocytes: 0 %
Lymphocytes Relative: 7 %
Lymphs Abs: 0.9 10*3/uL (ref 0.7–4.0)
MCH: 30.2 pg (ref 26.0–34.0)
MCHC: 32.4 g/dL (ref 30.0–36.0)
MCV: 93.1 fL (ref 80.0–100.0)
Monocytes Absolute: 0.7 10*3/uL (ref 0.1–1.0)
Monocytes Relative: 6 %
Neutro Abs: 11.2 10*3/uL — ABNORMAL HIGH (ref 1.7–7.7)
Neutrophils Relative %: 87 %
Platelets: 209 10*3/uL (ref 150–400)
RBC: 3.64 MIL/uL — ABNORMAL LOW (ref 3.87–5.11)
RDW: 12.7 % (ref 11.5–15.5)
WBC: 12.9 10*3/uL — ABNORMAL HIGH (ref 4.0–10.5)
nRBC: 0 % (ref 0.0–0.2)

## 2018-04-05 LAB — BASIC METABOLIC PANEL
Anion gap: 10 (ref 5–15)
BUN: 15 mg/dL (ref 8–23)
CO2: 19 mmol/L — ABNORMAL LOW (ref 22–32)
Calcium: 8.8 mg/dL — ABNORMAL LOW (ref 8.9–10.3)
Chloride: 106 mmol/L (ref 98–111)
Creatinine, Ser: 0.97 mg/dL (ref 0.44–1.00)
GFR calc Af Amer: >60 mL/min (ref >60)
GFR calc non Af Amer: 55 mL/min — ABNORMAL LOW (ref >60)
Glucose, Bld: 279 mg/dL — ABNORMAL HIGH (ref 70–99)
Potassium: 4 mmol/L (ref 3.5–5.1)
Sodium: 135 mmol/L (ref 135–145)

## 2018-04-05 LAB — ECHOCARDIOGRAM COMPLETE

## 2018-04-05 LAB — HEMOGLOBIN A1C
Hgb A1c MFr Bld: 8 % — ABNORMAL HIGH (ref 4.8–5.6)
Mean Plasma Glucose: 182.9 mg/dL

## 2018-04-05 LAB — LIPID PANEL
Cholesterol: 131 mg/dL (ref 0–200)
HDL: 42 mg/dL (ref >40)
LDL Cholesterol: 63 mg/dL (ref 0–99)
Total CHOL/HDL Ratio: 3.1 RATIO
Triglycerides: 130 mg/dL (ref <150)
VLDL: 26 mg/dL (ref 0–40)

## 2018-04-05 LAB — GLUCOSE, CAPILLARY
Glucose-Capillary: 233 mg/dL — ABNORMAL HIGH (ref 70–99)
Glucose-Capillary: 247 mg/dL — ABNORMAL HIGH (ref 70–99)
Glucose-Capillary: 212 mg/dL — ABNORMAL HIGH (ref 70–99)
Glucose-Capillary: 167 mg/dL — ABNORMAL HIGH (ref 70–99)
Glucose-Capillary: 190 mg/dL — ABNORMAL HIGH (ref 70–99)

## 2018-04-05 LAB — MRSA PCR SCREENING: MRSA by PCR: NEGATIVE

## 2018-04-05 MED ORDER — RESOURCE THICKENUP CLEAR PO POWD
Freq: Once | ORAL | Status: AC
  Administered 2018-04-05: 13:00:00 via ORAL
  Filled 2018-04-05: qty 125

## 2018-04-05 MED ORDER — IOHEXOL 300 MG/ML  SOLN
75.0000 mL | Freq: Once | INTRAMUSCULAR | Status: AC | PRN
  Administered 2018-04-05: 01:00:00 75 mL via INTRA_ARTERIAL

## 2018-04-05 MED ORDER — AMLODIPINE BESYLATE 5 MG PO TABS
5.0000 mg | ORAL_TABLET | Freq: Every day | ORAL | Status: DC
  Administered 2018-04-05 – 2018-04-07 (×3): 5 mg via ORAL
  Filled 2018-04-05 (×3): qty 1

## 2018-04-05 MED ORDER — LEVOTHYROXINE SODIUM 50 MCG PO TABS
62.5000 ug | ORAL_TABLET | Freq: Every day | ORAL | Status: DC
  Administered 2018-04-06 – 2018-04-07 (×2): 62.5 ug via ORAL
  Filled 2018-04-05 (×2): qty 1

## 2018-04-05 MED ORDER — HYDROXYZINE HCL 25 MG PO TABS
50.0000 mg | ORAL_TABLET | Freq: Every day | ORAL | Status: DC
  Administered 2018-04-05 – 2018-04-06 (×2): 50 mg via ORAL
  Filled 2018-04-05: qty 2
  Filled 2018-04-05: qty 1

## 2018-04-05 MED ORDER — CARVEDILOL 12.5 MG PO TABS
12.5000 mg | ORAL_TABLET | Freq: Two times a day (BID) | ORAL | Status: DC
  Administered 2018-04-05 – 2018-04-07 (×4): 12.5 mg via ORAL
  Filled 2018-04-05 (×4): qty 1

## 2018-04-05 MED ORDER — CLOPIDOGREL BISULFATE 75 MG PO TABS
75.0000 mg | ORAL_TABLET | Freq: Every day | ORAL | Status: DC
  Administered 2018-04-05 – 2018-04-07 (×3): 75 mg via ORAL
  Filled 2018-04-05 (×3): qty 1

## 2018-04-05 MED ORDER — GLIPIZIDE ER 2.5 MG PO TB24
2.5000 mg | ORAL_TABLET | Freq: Every day | ORAL | Status: DC
  Administered 2018-04-06 – 2018-04-07 (×2): 2.5 mg via ORAL
  Filled 2018-04-05 (×2): qty 1

## 2018-04-05 MED ORDER — BUPROPION HCL ER (XL) 150 MG PO TB24
300.0000 mg | ORAL_TABLET | Freq: Every day | ORAL | Status: DC
  Administered 2018-04-05 – 2018-04-07 (×3): 300 mg via ORAL
  Filled 2018-04-05: qty 1
  Filled 2018-04-05: qty 2
  Filled 2018-04-05: qty 1

## 2018-04-05 MED ORDER — ASPIRIN EC 81 MG PO TBEC
81.0000 mg | DELAYED_RELEASE_TABLET | Freq: Every day | ORAL | Status: DC
  Administered 2018-04-06 – 2018-04-07 (×2): 81 mg via ORAL
  Filled 2018-04-05 (×2): qty 1

## 2018-04-05 MED ORDER — ACETAMINOPHEN-CODEINE #3 300-30 MG PO TABS
1.0000 | ORAL_TABLET | Freq: Four times a day (QID) | ORAL | Status: DC | PRN
  Administered 2018-04-05 – 2018-04-06 (×3): 1 via ORAL
  Filled 2018-04-05 (×4): qty 1

## 2018-04-05 MED ORDER — SODIUM CHLORIDE 0.9 % IV SOLN
INTRAVENOUS | Status: DC
  Administered 2018-04-05: 18:00:00 via INTRAVENOUS

## 2018-04-05 MED ORDER — INSULIN ASPART 100 UNIT/ML ~~LOC~~ SOLN
0.0000 [IU] | Freq: Three times a day (TID) | SUBCUTANEOUS | Status: DC
  Administered 2018-04-05: 08:00:00 5 [IU] via SUBCUTANEOUS
  Administered 2018-04-05: 17:00:00 3 [IU] via SUBCUTANEOUS
  Administered 2018-04-05 – 2018-04-06 (×3): 5 [IU] via SUBCUTANEOUS
  Administered 2018-04-06: 3 [IU] via SUBCUTANEOUS
  Administered 2018-04-07 (×2): 5 [IU] via SUBCUTANEOUS

## 2018-04-05 MED ORDER — CLEVIDIPINE BUTYRATE 0.5 MG/ML IV EMUL
0.0000 mg/h | INTRAVENOUS | Status: DC
  Administered 2018-04-05: 02:00:00 2 mg/h via INTRAVENOUS

## 2018-04-05 MED ORDER — LISINOPRIL 20 MG PO TABS
40.0000 mg | ORAL_TABLET | Freq: Every day | ORAL | Status: DC
  Administered 2018-04-05 – 2018-04-07 (×3): 40 mg via ORAL
  Filled 2018-04-05 (×3): qty 2

## 2018-04-05 MED ORDER — ATORVASTATIN CALCIUM 10 MG PO TABS
10.0000 mg | ORAL_TABLET | Freq: Every day | ORAL | Status: DC
  Administered 2018-04-05 – 2018-04-06 (×2): 10 mg via ORAL
  Filled 2018-04-05 (×2): qty 1

## 2018-04-05 MED ORDER — CLEVIDIPINE BUTYRATE 0.5 MG/ML IV EMUL
INTRAVENOUS | Status: AC
  Filled 2018-04-05: qty 50

## 2018-04-05 MED ORDER — ACETAMINOPHEN 650 MG RE SUPP
650.0000 mg | RECTAL | Status: DC | PRN
  Administered 2018-04-05 (×2): 650 mg via RECTAL
  Filled 2018-04-05 (×2): qty 1

## 2018-04-05 MED ORDER — ESCITALOPRAM OXALATE 10 MG PO TABS
5.0000 mg | ORAL_TABLET | Freq: Every day | ORAL | Status: DC
  Administered 2018-04-05 – 2018-04-06 (×2): 5 mg via ORAL
  Filled 2018-04-05 (×2): qty 1

## 2018-04-05 MED ORDER — SUGAMMADEX SODIUM 200 MG/2ML IV SOLN
INTRAVENOUS | Status: DC | PRN
  Administered 2018-04-05: 200 mg via INTRAVENOUS

## 2018-04-05 MED ORDER — LABETALOL HCL 5 MG/ML IV SOLN
INTRAVENOUS | Status: DC | PRN
  Administered 2018-04-05: 5 mg via INTRAVENOUS

## 2018-04-05 MED ORDER — ACETAMINOPHEN 325 MG PO TABS
650.0000 mg | ORAL_TABLET | ORAL | Status: DC | PRN
  Administered 2018-04-05 – 2018-04-07 (×6): 650 mg via ORAL
  Filled 2018-04-05 (×6): qty 2

## 2018-04-05 MED ORDER — ACETAMINOPHEN 160 MG/5ML PO SOLN: 650.0000 mg | ORAL | Status: DC | PRN

## 2018-04-05 NOTE — Progress Notes (Signed)
Patient ID: Jessica Meyer, female   DOB: 14-May-1935, 83 y.o.   MRN: [id number] INR. Patient extubated . Maintaining O2 sats. Opens eyes to command. RT groin soft. No hematoma. Distal pulses palpable DPs and PTs bilaterally. S.Idelia Caudell MD

## 2018-04-05 NOTE — Evaluation (Signed)
Speech Language Pathology Evaluation Patient Details Name: Sherrae Lehrer MRN: [id number] DOB: 04/14/1935 Today's Date: 04/05/2018 Time: 9211-9417 SLP Time Calculation (min) (ACUTE ONLY): 11 min  Problem List:  Patient Active Problem List   Diagnosis Date Noted  . Middle cerebral artery embolism, right 04/05/2018  . Stroke (cerebrum) (HCC) 04/04/2018  . Atypical chest pain 11/10/2017  . CKD stage G3b/A1, GFR 30-44 and albumin creatinine ratio <30 mg/g (HCC) 08/10/2016  . Left hip pain 06/14/2016  . Type 2 diabetes mellitus with stage 3 chronic kidney disease, with long-term current use of insulin (HCC) 01/29/2016  . Leukocytosis 01/25/2016  . Closed fracture of right hip (HCC)   . Fall   . Left knee pain 05/28/2015  . Sprain of hand, third finger, right 05/28/2015  . HTN (hypertension) 07/29/2014  . Gait instability 07/29/2014  . Hyperlipidemia 07/29/2014  . Right knee pain 04/11/2014  . ASNHL (asymmetrical sensorineural hearing loss) 08/07/2013  . 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 Hernia, rectovaginal 08/07/2013  . Hypercholesterolemia without hypertriglyceridemia 08/07/2013  . Arthritis of knee, degenerative 08/07/2013  . Degenerative arthritis of lumbar spine 08/07/2013  . Osteopenia 08/07/2013  . Adult hypothyroidism 08/07/2013  . History of artificial joint 08/07/2013  . Essential (primary) hypertension 08/07/2013  . Depression with anxiety 08/07/2013   Past Medical History:  Past Medical History:  Diagnosis Date  . Arthritis   . Diabetes mellitus without complication (HCC)   . History of chicken pox   . Hyperlipidemia   . Hypertension   . Thyroid disease    hypothyroid   Past Surgical History:  Past Surgical History:  Procedure Laterality Date  . ABDOMINAL HYSTERECTOMY    . HIP ARTHROPLASTY Right 01/26/2016   Procedure: ARTHROPLASTY BIPOLAR HIP (HEMIARTHROPLASTY);  Surgeon: Sheral Apley, MD;  Location: [facility] OR;  Service: Orthopedics;  Laterality: Right;  . KNEE  ARTHROSCOPY Bilateral   . REPLACEMENT TOTAL KNEE BILATERAL     1998 left , 1992 right   HPI:  83 y.o. female with a history of hypertension, hyperlipidemia, diabetes who presented with left-sided weakness and neglect. Pt with right MCA occlusion.   Bilateral common carotid arteriograms followed unsuccessful attempt at revascularization of Rt MCA occlusion due to prox Rt ICA occlusion despite multiple angioplasties. intubated for procedure only. Failed RN stroke swallow screen.    Assessment / Plan / Recommendation Clinical Impression  Pt presents with right hemisphere disorder with deficits consistent with neuropathology, including inattention, impaired awareness and insight, decreased retrieval of novel information, disorientation to elements of time.  Speech is generally intelligible; pt sleepy this am but interactive.  Recommend SLP to follow for cognition; pt may benefit from CIR if support is available.     SLP Assessment  SLP Recommendation/Assessment: Patient needs continued Speech Lanaguage Pathology Services SLP Visit Diagnosis: Cognitive communication deficit (R41.841)    Follow Up Recommendations  Inpatient Rehab    Frequency and Duration min 2x/week  1 week      SLP Evaluation Cognition  Overall Cognitive Status: Impaired/Different from baseline Arousal/Alertness: Awake/alert Orientation Level: Oriented to person;Oriented to place;Oriented to situation;Disoriented to time Attention: Sustained Sustained Attention: Impaired Sustained Attention Impairment: Verbal basic Memory: Impaired Memory Impairment: Retrieval deficit Awareness: Impaired Awareness Impairment: Emergent impairment Problem Solving: Impaired Problem Solving Impairment: Verbal basic;Functional basic Executive Function: Reasoning Reasoning: Impaired Behaviors: Impulsive Safety/Judgment: Impaired       Comprehension  Auditory Comprehension Overall Auditory Comprehension: Appears within functional  limits for tasks assessed Reading Comprehension Reading Status: Not tested  Expression Expression Primary Mode of Expression: Verbal Verbal Expression Overall Verbal Expression: Appears within functional limits for tasks assessed Written Expression Dominant Hand: Right Written Expression: Not tested   Oral / Motor  Oral Motor/Sensory Function Overall Oral Motor/Sensory Function: Mild impairment Facial Symmetry: Abnormal symmetry left;Suspected CN VII (facial) dysfunction Facial Strength: Reduced left;Suspected CN VII (facial) dysfunction Facial Sensation: Within Functional Limits Lingual ROM: Within Functional Limits Lingual Symmetry: Within Functional Limits Motor Speech Overall Motor Speech: Appears within functional limits for tasks assessed   GO                    Blenda Mounts Laurice 04/05/2018, 11:43 AM  Marchelle Folks L. Samson Frederic, MA CCC/SLP Acute Rehabilitation Services Office number [phone] Pager [phone]

## 2018-04-05 NOTE — Evaluation (Signed)
Occupational Therapy Evaluation Patient Details Name: Jessica Meyer MRN: [id number] DOB: 08-24-1935 Today's Date: 04/05/2018    History of Present Illness 83 yo admitted with left weakness and fall s/p unsuccessful IR attempt of Rt MCA CVA. PMhx: HTN, HLD, DM, arthritis, Rt hip hemi   Clinical Impression   This 83 y/o female presents with the above. At baseline pt reports she is independent with ADL and functional mobility, was living alone and performing most iADL on her own, reports daughter assisted with driving. Pt presenting with impaired cognition, visual deficits including L inattention/neglect, decreased sitting and standing balance, LUE impairments. Pt presents supine in bed pleasant and willing to participate in therapy session. She currently requires two person assist for safe completion of bed mobility and stand pivot transfers. Pt with strong L lateral lean in sitting and standing requiring at least maxA for static sitting balance. She currently requires modA for UB ADL, maxA (+2) for LB ADL. Given max cues pt is able to turn her head towards the L, though noted unable to visually track past midline towards L visual field at this time. Pt also requiring max cues to attend to grooming items placed in hand in order to initiate completing functional task. Pt will benefit from continued acute OT services and feel she is a good candidate for CIR level therapy services at time of discharge. Will follow.     Follow Up Recommendations  CIR;Supervision/Assistance - 24 hour    Equipment Recommendations  Other (comment)(defer to next venue)    Recommendations for Other Services Rehab consult     Precautions / Restrictions Precautions Precautions: Fall Precaution Comments: left inattention and right gaze, increased extensor tone LLE Restrictions Weight Bearing Restrictions: No      Mobility Bed Mobility Overal bed mobility: Needs Assistance Bed Mobility: Supine to Sit     Supine  to sit: Max assist;+2 for physical assistance     General bed mobility comments: physical assist to bring LLE to EOB and assist to elevate trunk. pt with posterior left lean and max assist for trunk control in sitting with cues and facilitation for anterior right translation of trunk  Transfers Overall transfer level: Needs assistance Equipment used: 2 person hand held assist Transfers: Sit to/from Stand;Stand Pivot Transfers Sit to Stand: Max assist;+2 safety/equipment;+2 physical assistance Stand pivot transfers: Max assist;+2 safety/equipment;+2 physical assistance       General transfer comment: max assist to stand from bed with bil UE supported and LLE blocked. Physical assist to pivot to chair and scoot back on surface. Pt with increased LLE extensor tone in standing.     Balance Overall balance assessment: Needs assistance   Sitting balance-Leahy Scale: Zero Sitting balance - Comments: max assist sitting balance due to posterior left lean Postural control: Posterior lean;Left lateral lean Standing balance support: Bilateral upper extremity supported Standing balance-Leahy Scale: Zero                             ADL either performed or assessed with clinical judgement   ADL Overall ADL's : Needs assistance/impaired Eating/Feeding: NPO Eating/Feeding Details (indicate cue type and reason): pt perseverating on wanting water this session, SLP following up today for swallow eval Grooming: Brushing hair;Minimal assistance;Sitting Grooming Details (indicate cue type and reason): required max questioning cues to notice she had comb in her hand, once redirected to task pt able to perform while in supported sitting in recliner Upper Body Bathing:  Moderate assistance;Sitting   Lower Body Bathing: Maximal assistance;+2 for physical assistance;+2 for safety/equipment;Sitting/lateral leans;Sit to/from stand   Upper Body Dressing : Moderate assistance;Sitting   Lower Body  Dressing: +2 for physical assistance;+2 for safety/equipment;Maximal assistance;Sit to/from stand   Toilet Transfer: Maximal assistance;+2 for physical assistance;+2 for safety/equipment;Stand-pivot   Toileting- Clothing Manipulation and Hygiene: Maximal assistance;+2 for physical assistance;+2 for safety/equipment;Sit to/from stand       Functional mobility during ADLs: Maximal assistance;+2 for physical assistance;+2 for safety/equipment(stand pivot transfer ) General ADL Comments: pt with significant L lateral lean, poor sitting and standing balance, L inattention, impaired cognition     Vision Baseline Vision/History: Wears glasses Wears Glasses: At all times Patient Visual Report: No change from baseline Vision Assessment?: Yes Eye Alignment: Within Functional Limits Ocular Range of Motion: Restricted on the left Alignment/Gaze Preference: Gaze right Tracking/Visual Pursuits: Impaired - to be further tested in functional context;Unable to hold eye position out of midline;Decreased smoothness of horizontal tracking;Decreased smoothness of vertical tracking Additional Comments: pt with L inattention/neglect; requires max cues to turn head towards L to locate therapist; pt is able to turn head towards L when cued but noted eyes do not track past midline at this time     Perception     Praxis      Pertinent Vitals/Pain Pain Assessment: No/denies pain     Hand Dominance Right   Extremity/Trunk Assessment Upper Extremity Assessment Upper Extremity Assessment: LUE deficits/detail LUE Deficits / Details: initially appears flaccid though after transfer to recliner noted increased tone/spasticity, some withdrawal noted to painful stimuli LUE Coordination: decreased fine motor;decreased gross motor   Lower Extremity Assessment Lower Extremity Assessment: Defer to PT evaluation LLE Deficits / Details: extensor tone in bed  with ability to break into flexion, upon standing pt returns  to extensor tone with increased plantar flexion. Pt with withdrawal to noxious stimuli    Cervical / Trunk Assessment Cervical / Trunk Assessment: Kyphotic;Other exceptions Cervical / Trunk Exceptions: left posterior lean   Communication Communication Communication: Expressive difficulties   Cognition Arousal/Alertness: Awake/alert Behavior During Therapy: WFL for tasks assessed/performed Overall Cognitive Status: Impaired/Different from baseline Area of Impairment: Memory;Problem solving;Following commands;Safety/judgement;Awareness;Attention                   Current Attention Level: Sustained Memory: Decreased short-term memory Following Commands: Follows one step commands inconsistently;Follows one step commands with increased time Safety/Judgement: Decreased awareness of safety;Decreased awareness of deficits Awareness: Intellectual Problem Solving: Slow processing;Decreased initiation;Difficulty sequencing;Requires verbal cues General Comments: pt unaware of comb in hand when presented to her. will turn head to left to visualize stimulus with increased cues and direction to task. Pt with inability to follow commands for balance but will reach with RUE with cues   General Comments       Exercises     Shoulder Instructions      Home Living Family/patient expects to be discharged to:: Private residence Living Arrangements: Alone Available Help at Discharge: Family Type of Home: House Home Access: Stairs to enter Secretary/administrator of Steps: 3 Entrance Stairs-Rails: Right Home Layout: One level     Bathroom Shower/Tub: Chief Strategy Officer: Standard     Home Equipment: Shower seat;Bedside commode;Cane - single point      Lives With: Alone    Prior Functioning/Environment Level of Independence: Independent        Comments: lives alone performs ADLs, housework, cooking and puzzles, doesn't drive  OT Problem List: Decreased  strength;Decreased range of motion;Decreased activity tolerance;Impaired balance (sitting and/or standing);Impaired vision/perception;Decreased coordination;Decreased safety awareness;Decreased cognition;Decreased knowledge of use of DME or AE;Impaired UE functional use;Impaired tone;Impaired sensation      OT Treatment/Interventions: Self-care/ADL training;Therapeutic exercise;Neuromuscular education;Energy conservation;DME and/or AE instruction;Therapeutic activities;Patient/family education;Balance training;Visual/perceptual remediation/compensation;Cognitive remediation/compensation    OT Goals(Current goals can be found in the care plan section) Acute Rehab OT Goals Patient Stated Goal: do puzzles, bake OT Goal Formulation: With patient Time For Goal Achievement: 04/19/18 Potential to Achieve Goals: Good  OT Frequency: Min 3X/week   Barriers to D/C:            Co-evaluation PT/OT/SLP Co-Evaluation/Treatment: Yes Reason for Co-Treatment: Complexity of the patient's impairments (multi-system involvement);For patient/therapist safety PT goals addressed during session: Mobility/safety with mobility;Balance OT goals addressed during session: ADL's and self-care;Strengthening/ROM      AM-PAC OT "6 Clicks" Daily Activity     Outcome Measure Help from another person eating meals?: Total(NPO) Help from another person taking care of personal grooming?: A Lot Help from another person toileting, which includes using toliet, bedpan, or urinal?: A Lot Help from another person bathing (including washing, rinsing, drying)?: A Lot Help from another person to put on and taking off regular upper body clothing?: A Lot Help from another person to put on and taking off regular lower body clothing?: Total 6 Click Score: 10   End of Session Equipment Utilized During Treatment: Gait belt Nurse Communication: Mobility status  Activity Tolerance: Patient tolerated treatment well Patient left: in  chair;with call bell/phone within reach;with chair alarm set  OT Visit Diagnosis: Unsteadiness on feet (R26.81);Muscle weakness (generalized) (M62.81);Hemiplegia and hemiparesis Hemiplegia - Right/Left: Left Hemiplegia - dominant/non-dominant: Non-Dominant Hemiplegia - caused by: Cerebral infarction                Time: 1740-8144 OT Time Calculation (min): 23 min Charges:  OT General Charges $OT Visit: 1 Visit OT Evaluation $OT Eval Moderate Complexity: 1 Mod  Marcy Siren, OT [employer] Pager [phone] Office [phone]  Orlando Penner 04/05/2018, 2:49 PM

## 2018-04-05 NOTE — Progress Notes (Signed)
PT Cancellation Note  Patient Details Name: Jessica Meyer MRN: [id number] DOB: 02/13/1935   Cancelled Treatment:    Reason Eval/Treat Not Completed: Active bedrest order   Enedina Finner Melroy Bougher 04/05/2018, 6:52 AM Delaney Meigs, PT Acute Rehabilitation Services Pager: [phone] Office: [phone]

## 2018-04-05 NOTE — Progress Notes (Addendum)
STROKE TEAM PROGRESS NOTE  INTERVAL HISTORY  Patient is sitting up in bed. She has right head and gaze deviation and profound left neglect. Blood pressure is adequately controlled. Mechanical thrombectomy was attempted but was unsuccessful.  Vitals:   04/05/18 0800 04/05/18 0900 04/05/18 1000 04/05/18 1100  BP: 124/75  135/63 140/66  Pulse: 96 89 92 90  Resp:   (!) 21 19  Temp: 98.5 F (36.9 C)     TempSrc: Axillary     SpO2: 96% 97% 95% 96%    CBC:  Recent Labs  Lab 04/04/18 2130 04/05/18 0412  WBC 13.4* 12.9*  NEUTROABS 11.4* 11.2*  HGB 12.8 11.0*  HCT 40.2 33.9*  MCV 94.8 93.1  PLT 240 209    Basic Metabolic Panel:  Recent Labs  Lab 04/04/18 2130 04/04/18 2139 04/05/18 0412  NA 135  --  135  K 4.1  --  4.0  CL 102  --  106  CO2 20*  --  19*  GLUCOSE 247*  --  279*  BUN 17  --  15  CREATININE 1.13* 1.00 0.97  CALCIUM 9.9  --  8.8*   Lipid Panel:     Component Value Date/Time   CHOL 131 04/05/2018 0412   TRIG 130 04/05/2018 0412   HDL 42 04/05/2018 0412   CHOLHDL 3.1 04/05/2018 0412   VLDL 26 04/05/2018 0412   LDLCALC 63 04/05/2018 0412   HgbA1c:  Lab Results  Component Value Date   HGBA1C 8.0 (H) 04/05/2018   Urine Drug Screen: No results found for: LABOPIA, COCAINSCRNUR, LABBENZ, AMPHETMU, THCU, LABBARB  Alcohol Level     Component Value Date/Time   ETH <10 04/04/2018 2130    IMAGING Ct Angio Head W Or Wo Contrast  Result Date: 04/04/2018 CLINICAL DATA:  History of hypertension and hyperlipidemia. LEFT facial droop and LEFT extremity weakness. EXAM: CT ANGIOGRAPHY HEAD AND NECK CT PERFUSION BRAIN TECHNIQUE: Multidetector CT imaging of the head and neck was performed using the standard protocol during bolus administration of intravenous contrast. Multiplanar CT image reconstructions and MIPs were obtained to evaluate the vascular anatomy. Carotid stenosis measurements (when applicable) are obtained utilizing NASCET criteria, using the distal  internal carotid diameter as the denominator. Multiphase CT imaging of the brain was performed following IV bolus contrast injection. Subsequent parametric perfusion maps were calculated using RAPID software. CONTRAST:  OMNIPAQUE IOHEXOL 350 MG/ML SOLN COMPARISON:  CT HEAD April 04, 2018 at 2137 hours. FINDINGS: CTA NECK FINDINGS: AORTIC ARCH: Normal appearance of the thoracic arch, normal branch pattern. Mild calcific atherosclerosis aortic arch. The origins of the innominate, left Common carotid artery and subclavian artery are patent. Mild RIGHT and moderate LEFT subclavian artery stenosis due to atherosclerosis. RIGHT CAROTID SYSTEM: Common carotid artery is patent. Calcific atherosclerosis, occluded RIGHT internal carotid artery with thready faint recanalization. LEFT CAROTID SYSTEM: Common carotid artery is patent. Calcific atherosclerosis with short segment critical stenosis LEFT ICA origin. VERTEBRAL ARTERIES:Left vertebral artery is dominant. Severe stenosis bilateral vertebral artery origins. Patent vertebral arteries. SKELETON: No acute osseous process though bone windows have not been submitted. OTHER NECK: Soft tissues of the neck are nonacute though, not tailored for evaluation. C2-3 canal stenosis due to disc osteophyte complex. UPPER CHEST: Included lung apices are clear. No superior mediastinal lymphadenopathy. CTA HEAD FINDINGS: ANTERIOR CIRCULATION: RIGHT petrous 2 cavernous ICA occluded with minimal reconstitution at carotid siphon. LEFT internal carotid artery is patent with moderate stenosis supraclinoid segment. Patent bilateral carotid termini. Emergent RIGHT  M1 occlusion with intermediate collateralization by single-phase CTA. Patent anterior cerebral arteries and LEFT MCA. Moderate tandem stenosis LEFT ACA. No  contrast extravasation or aneurysm. POSTERIOR CIRCULATION: Patent vertebral arteries, vertebrobasilar junction and basilar artery, as well as main branch vessels. Severe  stenosis RIGHT V4 segment. Patent posterior cerebral arteries, mild tandem stenoses compatible with atherosclerosis. No large vessel occlusion, flow-limiting stenosis, contrast extravasation or aneurysm. VENOUS SINUSES: Major dural venous sinuses are patent though not tailored for evaluation on this angiographic examination. ANATOMIC VARIANTS: None. DELAYED PHASE: Not performed. MIP images reviewed. CT Brain Perfusion Findings: Moderate motion degraded examination. CBF (<30%) Volume: 13mL Perfusion (Tmax>6.0s) volume: 86mL Mismatch Volume: 73mL Infarction Location:RIGHT frontotemporal parietal penumbra with RIGHT basal ganglia, RIGHT periventricular and RIGHT temporal core infarcts. IMPRESSION: CTA NECK: 1. Occluded RIGHT ICA with trace reconstitution. 2. Critical stenosis LEFT ICA origin. 3. Severe stenosis bilateral vertebral artery origins, patent vertebral arteries. CTA HEAD: 1. Occluded RIGHT ICA with reconstitution at carotid terminus. Emergent RIGHT M1 occlusion with intermediate collaterals by single-phase CTA. CT PERFUSION: 1. Motion degraded examination. Large area RIGHT MCA penumbra with 9 cc RIGHT basal ganglia and possible temporal lobe core infarcts. 2. Moderate stenoses LEFT ACA. Critical Value/emergent results text paged to Dr.MCNEILL [facility] via AM[employer] secure system on 04/04/2018 at 10:08 pm, including interpreting physician's phone number. Acute findings discussed with and reconfirmed by Dr.MCNEILL [facility] on 04/04/2018 at 10:20 pm. Aortic Atherosclerosis (ICD10-I70.0). Electronically Signed   By: Awilda Metro M.D.   On: 04/04/2018 22:21   Ct Angio Neck W Or Wo Contrast  Result Date: 04/04/2018 CLINICAL DATA:  History of hypertension and hyperlipidemia. LEFT facial droop and LEFT extremity weakness. EXAM: CT ANGIOGRAPHY HEAD AND NECK CT PERFUSION BRAIN TECHNIQUE: Multidetector CT imaging of the head and neck was performed using the standard protocol during bolus administration of  intravenous contrast. Multiplanar CT image reconstructions and MIPs were obtained to evaluate the vascular anatomy. Carotid stenosis measurements (when applicable) are obtained utilizing NASCET criteria, using the distal internal carotid diameter as the denominator. Multiphase CT imaging of the brain was performed following IV bolus contrast injection. Subsequent parametric perfusion maps were calculated using RAPID software. CONTRAST:  OMNIPAQUE IOHEXOL 350 MG/ML SOLN COMPARISON:  CT HEAD April 04, 2018 at 2137 hours. FINDINGS: CTA NECK FINDINGS: AORTIC ARCH: Normal appearance of the thoracic arch, normal branch pattern. Mild calcific atherosclerosis aortic arch. The origins of the innominate, left Common carotid artery and subclavian artery are patent. Mild RIGHT and moderate LEFT subclavian artery stenosis due to atherosclerosis. RIGHT CAROTID SYSTEM: Common carotid artery is patent. Calcific atherosclerosis, occluded RIGHT internal carotid artery with thready faint recanalization. LEFT CAROTID SYSTEM: Common carotid artery is patent. Calcific atherosclerosis with short segment critical stenosis LEFT ICA origin. VERTEBRAL ARTERIES:Left vertebral artery is dominant. Severe stenosis bilateral vertebral artery origins. Patent vertebral arteries. SKELETON: No acute osseous process though bone windows have not been submitted. OTHER NECK: Soft tissues of the neck are nonacute though, not tailored for evaluation. C2-3 canal stenosis due to disc osteophyte complex. UPPER CHEST: Included lung apices are clear. No superior mediastinal lymphadenopathy. CTA HEAD FINDINGS: ANTERIOR CIRCULATION: RIGHT petrous 2 cavernous ICA occluded with minimal reconstitution at carotid siphon. LEFT internal carotid artery is patent with moderate stenosis supraclinoid segment. Patent bilateral carotid termini. Emergent RIGHT M1 occlusion with intermediate collateralization by single-phase CTA. Patent anterior cerebral arteries and LEFT  MCA. Moderate tandem stenosis LEFT ACA. No  contrast extravasation or aneurysm. POSTERIOR CIRCULATION: Patent vertebral arteries, vertebrobasilar junction and  basilar artery, as well as main branch vessels. Severe stenosis RIGHT V4 segment. Patent posterior cerebral arteries, mild tandem stenoses compatible with atherosclerosis. No large vessel occlusion, flow-limiting stenosis, contrast extravasation or aneurysm. VENOUS SINUSES: Major dural venous sinuses are patent though not tailored for evaluation on this angiographic examination. ANATOMIC VARIANTS: None. DELAYED PHASE: Not performed. MIP images reviewed. CT Brain Perfusion Findings: Moderate motion degraded examination. CBF (<30%) Volume: 104mL Perfusion (Tmax>6.0s) volume: 5mL Mismatch Volume: 60mL Infarction Location:RIGHT frontotemporal parietal penumbra with RIGHT basal ganglia, RIGHT periventricular and RIGHT temporal core infarcts. IMPRESSION: CTA NECK: 1. Occluded RIGHT ICA with trace reconstitution. 2. Critical stenosis LEFT ICA origin. 3. Severe stenosis bilateral vertebral artery origins, patent vertebral arteries. CTA HEAD: 1. Occluded RIGHT ICA with reconstitution at carotid terminus. Emergent RIGHT M1 occlusion with intermediate collaterals by single-phase CTA. CT PERFUSION: 1. Motion degraded examination. Large area RIGHT MCA penumbra with 9 cc RIGHT basal ganglia and possible temporal lobe core infarcts. 2. Moderate stenoses LEFT ACA. Critical Value/emergent results text paged to Dr.MCNEILL [facility] via AMION secure system on 04/04/2018 at 10:08 pm, including interpreting physician's phone number. Acute findings discussed with and reconfirmed by Dr.MCNEILL [facility] on 04/04/2018 at 10:20 pm. Aortic Atherosclerosis (ICD10-I70.0). Electronically Signed   By: Awilda Metro M.D.   On: 04/04/2018 22:21   Ct C-spine No Charge  Result Date: 04/04/2018 CLINICAL DATA:  Neck pain. EXAM: CT CERVICAL SPINE WITHOUT CONTRAST TECHNIQUE: Multidetector  reformatted imaging of the cervical spine was performed from CTA NECK. COMPARISON:  None. FINDINGS: ALIGNMENT: Straightened lordosis.  No malalignment. SKULL BASE AND VERTEBRAE: Cervical vertebral bodies and posterior elements are intact. Moderate C2-3, C5-6 disc height loss, mild at C3-4 with endplate spurring compatible with degenerative discs. C1-2 articulation maintained with mild arthropathy. Osteopenia without destructive bony lesions. Small RIGHT C7 rib. SOFT TISSUES AND SPINAL CANAL: Please see dedicated CTA from same day, reported separately for vascular findings. DISC LEVELS: Severe canal stenosis C2-3, mild at C3-4. Moderate RIGHT C2-3, RIGHT C3-4 neural foraminal narrowing. UPPER CHEST: Lung apices are clear. OTHER: None. IMPRESSION: 1. No fracture or malalignment. 2. Severe canal stenosis C2-3. Moderate C2-3 and C3-4 neural foraminal narrowing. Electronically Signed   By: Awilda Metro M.D.   On: 04/04/2018 23:32   Ct Cerebral Perfusion W Contrast  Result Date: 04/04/2018 CLINICAL DATA:  History of hypertension and hyperlipidemia. LEFT facial droop and LEFT extremity weakness. EXAM: CT ANGIOGRAPHY HEAD AND NECK CT PERFUSION BRAIN TECHNIQUE: Multidetector CT imaging of the head and neck was performed using the standard protocol during bolus administration of intravenous contrast. Multiplanar CT image reconstructions and MIPs were obtained to evaluate the vascular anatomy. Carotid stenosis measurements (when applicable) are obtained utilizing NASCET criteria, using the distal internal carotid diameter as the denominator. Multiphase CT imaging of the brain was performed following IV bolus contrast injection. Subsequent parametric perfusion maps were calculated using RAPID software. CONTRAST:  OMNIPAQUE IOHEXOL 350 MG/ML SOLN COMPARISON:  CT HEAD April 04, 2018 at 2137 hours. FINDINGS: CTA NECK FINDINGS: AORTIC ARCH: Normal appearance of the thoracic arch, normal branch pattern. Mild calcific  atherosclerosis aortic arch. The origins of the innominate, left Common carotid artery and subclavian artery are patent. Mild RIGHT and moderate LEFT subclavian artery stenosis due to atherosclerosis. RIGHT CAROTID SYSTEM: Common carotid artery is patent. Calcific atherosclerosis, occluded RIGHT internal carotid artery with thready faint recanalization. LEFT CAROTID SYSTEM: Common carotid artery is patent. Calcific atherosclerosis with short segment critical stenosis LEFT ICA origin. VERTEBRAL ARTERIES:Left vertebral artery  is dominant. Severe stenosis bilateral vertebral artery origins. Patent vertebral arteries. SKELETON: No acute osseous process though bone windows have not been submitted. OTHER NECK: Soft tissues of the neck are nonacute though, not tailored for evaluation. C2-3 canal stenosis due to disc osteophyte complex. UPPER CHEST: Included lung apices are clear. No superior mediastinal lymphadenopathy. CTA HEAD FINDINGS: ANTERIOR CIRCULATION: RIGHT petrous 2 cavernous ICA occluded with minimal reconstitution at carotid siphon. LEFT internal carotid artery is patent with moderate stenosis supraclinoid segment. Patent bilateral carotid termini. Emergent RIGHT M1 occlusion with intermediate collateralization by single-phase CTA. Patent anterior cerebral arteries and LEFT MCA. Moderate tandem stenosis LEFT ACA. No  contrast extravasation or aneurysm. POSTERIOR CIRCULATION: Patent vertebral arteries, vertebrobasilar junction and basilar artery, as well as main branch vessels. Severe stenosis RIGHT V4 segment. Patent posterior cerebral arteries, mild tandem stenoses compatible with atherosclerosis. No large vessel occlusion, flow-limiting stenosis, contrast extravasation or aneurysm. VENOUS SINUSES: Major dural venous sinuses are patent though not tailored for evaluation on this angiographic examination. ANATOMIC VARIANTS: None. DELAYED PHASE: Not performed. MIP images reviewed. CT Brain Perfusion Findings:  Moderate motion degraded examination. CBF (<30%) Volume: 53mL Perfusion (Tmax>6.0s) volume: 39mL Mismatch Volume: 22mL Infarction Location:RIGHT frontotemporal parietal penumbra with RIGHT basal ganglia, RIGHT periventricular and RIGHT temporal core infarcts. IMPRESSION: CTA NECK: 1. Occluded RIGHT ICA with trace reconstitution. 2. Critical stenosis LEFT ICA origin. 3. Severe stenosis bilateral vertebral artery origins, patent vertebral arteries. CTA HEAD: 1. Occluded RIGHT ICA with reconstitution at carotid terminus. Emergent RIGHT M1 occlusion with intermediate collaterals by single-phase CTA. CT PERFUSION: 1. Motion degraded examination. Large area RIGHT MCA penumbra with 9 cc RIGHT basal ganglia and possible temporal lobe core infarcts. 2. Moderate stenoses LEFT ACA. Critical Value/emergent results text paged to Dr.MCNEILL [facility] via AMION secure system on 04/04/2018 at 10:08 pm, including interpreting physician's phone number. Acute findings discussed with and reconfirmed by Dr.MCNEILL [facility] on 04/04/2018 at 10:20 pm. Aortic Atherosclerosis (ICD10-I70.0). Electronically Signed   By: Awilda Metro M.D.   On: 04/04/2018 22:21   Ct Head Code Stroke Wo Contrast  Result Date: 04/04/2018 CLINICAL DATA:  Code stroke. Last seen normal 1530 hours. LEFT facial droop. LEFT extremity weakness. EXAM: CT HEAD WITHOUT CONTRAST TECHNIQUE: Contiguous axial images were obtained from the base of the skull through the vertex without intravenous contrast. COMPARISON:  None. FINDINGS: Brain: Mild to moderate atrophy, not unexpected for age. Ventriculomegaly, hydrocephalus ex vacuo. Hypoattenuation of white matter, both focal and confluent, likely chronic microvascular ischemic change, but no clear areas of cortical hypodensity. No hemorrhage, mass lesion, or extra-axial fluid. Vascular: Calcification of the cavernous internal carotid arteries and distal LEFT vertebral artery consistent with cerebrovascular  atherosclerotic disease. Asymmetric hyperattenuation RIGHT MCA in the sylvian fissure, could reflect acute thrombus in one or both M2 MCA branches. Skull: Normal. Negative for fracture or focal lesion. Sinuses/Orbits: No acute finding. Other: None. ASPECTS [facility] Stroke Program Early CT Score) - Ganglionic level infarction (caudate, lentiform nuclei, internal capsule, insula, M1-M3 cortex): 7 - Supraganglionic infarction (M4-M6 cortex): 3 Total score (0-10 with 10 being normal): 10 IMPRESSION: 1. Asymmetric hyperattenuation RIGHT MCA in the Sylvian fissure could reflect acute thrombus in one or both M2 branches. 2. Atrophy and small vessel disease. 3. ASPECTS is 10. These results were communicated to Dr. Amada Jupiter at 9:46 pmon 4/1/2020by text page via the [employer] messaging system. * Electronically Signed   By: Elsie Stain M.D.   On: 04/04/2018 21:49   Cerebral angio S/P  Bilateral common  carotid arteriograms followed unsuccessful attempt at revascularization of Rt MCA occlusion due to prox Rt ICA occlusion despite multiple angioplasties.  2D Echocardiogram   1. The left ventricle has hyperdynamic systolic function, with an ejection fraction of >65%. The cavity size was normal. There is mildly increased left ventricular wall thickness. Left ventricular diastolic Doppler parameters are indeterminate. No  evidence of left ventricular regional wall motion abnormalities.  2. The right ventricle has normal systolic function. The cavity was normal. There is no increase in right ventricular wall thickness.  3. The mitral valve is grossly normal.  4. The tricuspid valve is grossly normal.  5. The aortic root and ascending aorta are normal in size and structure.   PHYSICAL EXAM Pleasant elderly Caucasian lady not in distress. . Afebrile. Head is nontraumatic. Neck is supple without bruit.    Cardiac exam no murmur or gallop. Lungs are clear to auscultation. Distal pulses are well felt. Neurological Exam ;   She is awake and alert oriented 2. Diminished attention, registration and recall. No aphasia or apraxia dysarthria. Forced right gaze duration. Unable to look to the left. Blinks to threat on the right but not on the left. Profound left hemibody neglect and left hemi-visual neglect.left lower facial weakness. Tongue midline. Spontaneous antigravity movements on the right side without weakness. And left hemiplegia with only trace withdrawal to painful stimuli in the lower extremities. Tone is diminished on the left and normal on the right. Left plantar upgoing right downgoing. ASSESSMENT/PLAN Ms. Nate Kritika Stukes is a 83 y.o. female with history of HTN, HLD, DB found down with L sided weakness. No tPA d/t out of the window.   Stroke:  R brain infarct in setting of R ICA and R MCA occlusion s/p unsuccessful IR, infarct secondary to proximal right ICA stenosis/occlusion  Code Stroke CT head asymmetric R MCA ? Thrombus one or both M2 branches. Small vessel disease. Atrophy. ASPECTS 10.     CTA head occluded R ICA. ELVO R M1 occlusion  CTA neck occluded R ICA. Critical stenosis L ICA origin. Severe B VA stenosis origins.   CT perfusion large R MCA penumbra w/ 9xx F BG and possible temporal lobe infarcts. Mod stenosis L ACA.  CT CS no fx. Severe C2-3 canal stenosis. Mod C2-3 and C3-4 neural foraminal narrowing  Cerebral angio attempted revascularization R MCA, R ICA occlusion  Post IR CT results not documented  MRI  Pending - concern for intracranial edema  2D Echo EF >65%. No source of embolus   LDL 63  HgbA1c 8.0  SCDs for VTE prophylaxis  aspirin 81 mg daily prior to admission, now on aspirin 325 mg daily. And Plavix 75 mg daily. Will change aspirin to 81 mg daily x 3 months then PLAVIX alone  Therapy recommendations:  Pending. Ok to be OOB  Disposition:  pending   DNR at pt request  Keep in ICU for now   Dysphagia   Secondary to stroke   On D1 nectar thick  diet  Headache  Secondary to stroke  Ok tylenol supp  Add Tylenol #3 once able to swallow  Hypertension  Stable . BP goal per IR x 24h . Treated w/ cleviprex . Home meds: norvasc 5, coreg 12.5 bid, lisinopril 40 . Resume home meds  Hyperlipidemia  Home meds:  lipitor 10, resumed in hospital  LDL 63, goal < 70  Continue statin at discharge  Diabetes type II, uncontrolled  HgbA1c 8.0, goal < 7.0  Home  meds: lantus 17, glipizide 2.5  Other Stroke Risk Factors  Advanced age  Hx snuff use  ETOH use, advised to drink no more than 1 drink(s) a day  Other Active Problems  Acute blood loss anemia s/p IR, Hgb 12.8->11.0  Arthritis    Hospital day # 1  I have personally obtained history,examined this patient, reviewed notes, independently viewed imaging studies, participated in medical decision making and plan of care.ROS completed by me personally and pertinent positives fully documented  I have made any additions or clarifications directly to the above note. Agree with note above.  She presented with large right MCA infarct secondary to proximal right carotid occlusion and distal embolization to the right MCA. She presented beyond the time window for IV TPA and mechanical thrombectomy was unsuccessful. Recommend close neurological monitoring and strict blood pressure control. Check MRI scan of the brain dated today. Speech therapy for swallow eval. Physical occupational therapy mobilize out of bed. Rehabilitation consult. Long discussion with the patient's daughter whom I spoke to over the phone and answered questions. Aspirin and Plavix if she is able to swallow.Check echocardiogram. Strict diabetes control. This patient is critically ill and at significant risk of neurological worsening, death and care requires constant monitoring of vital signs, hemodynamics,respiratory and cardiac monitoring, extensive review of multiple databases, frequent neurological assessment,  discussion with family, other specialists and medical decision making of high complexity.I have made any additions or clarifications directly to the above note.This critical care time does not reflect procedure time, or teaching time or supervisory time of PA/NP/Med Resident etc but could involve care discussion time.  I spent 45 minutes of neurocritical care time  in the care of  this patient.      Delia Heady, MD Medical Director [facility] Pager: [phone] 04/05/2018 2:33 PM   To contact Stroke Continuity provider, please refer to [email or web address]. After hours, contact General Neurology

## 2018-04-05 NOTE — Progress Notes (Signed)
Spoke to Neuro MD Amada Jupiter about my conversation with patient's daughter. Patient's daughter told me that her mom's wishes are to be no code blue/DNR. Will continue to monitor.

## 2018-04-05 NOTE — Transfer of Care (Signed)
Immediate Anesthesia Transfer of Care Note  Patient: Jessica Meyer  Procedure(s) Performed: RADIOLOGY WITH ANESTHESIA (N/A )  Patient Location: PACU  Anesthesia Type:General  Level of Consciousness: awake and drowsy  Airway & Oxygen Therapy: Patient Spontanous Breathing and Patient connected to nasal cannula oxygen  Post-op Assessment: Report given to RN and Post -op Vital signs reviewed and stable  Post vital signs: Reviewed and stable  Last Vitals:  Vitals Value Taken Time  BP    Temp 36.1 C 04/05/2018  1:30 AM  Pulse 72 04/05/2018  1:40 AM  Resp 16 04/05/2018  1:40 AM  SpO2 100 % 04/05/2018  1:40 AM  Vitals shown include unvalidated device data.  Last Pain:  Vitals:   04/04/18 2130  TempSrc: Oral         Complications: No apparent anesthesia complications

## 2018-04-05 NOTE — Anesthesia Postprocedure Evaluation (Signed)
Anesthesia Post Note  Patient: Jessica Meyer  Procedure(s) Performed: RADIOLOGY WITH ANESTHESIA (N/A )     Patient location during evaluation: PACU Anesthesia Type: General Level of consciousness: awake and confused Pain management: pain level controlled Vital Signs Assessment: post-procedure vital signs reviewed and stable Respiratory status: spontaneous breathing, nonlabored ventilation, respiratory function stable and patient connected to nasal cannula oxygen Cardiovascular status: blood pressure returned to baseline and stable Postop Assessment: no apparent nausea or vomiting Anesthetic complications: no    Last Vitals:  Vitals:   04/05/18 0604 04/05/18 0700  BP: 134/63 (!) 138/53  Pulse: 98 (!) 106  Resp: (!) 26   Temp:    SpO2: 96% 97%    Last Pain:  Vitals:   04/05/18 0400  TempSrc: Axillary  PainSc: 4                  Cecile Hearing

## 2018-04-05 NOTE — Evaluation (Addendum)
Clinical/Bedside Swallow Evaluation Patient Details  Name: Jessica Meyer MRN: [id number] Date of Birth: Oct 21, 1935  Today's Date: 04/05/2018 Time: SLP Start Time (ACUTE ONLY): 1041 SLP Stop Time (ACUTE ONLY): 1051 SLP Time Calculation (min) (ACUTE ONLY): 10 min  Past Medical History:  Past Medical History:  Diagnosis Date  . Arthritis   . Diabetes mellitus without complication (HCC)   . History of chicken pox   . Hyperlipidemia   . Hypertension   . Thyroid disease    hypothyroid   Past Surgical History:  Past Surgical History:  Procedure Laterality Date  . ABDOMINAL HYSTERECTOMY    . HIP ARTHROPLASTY Right 01/26/2016   Procedure: ARTHROPLASTY BIPOLAR HIP (HEMIARTHROPLASTY);  Surgeon: Sheral Apley, MD;  Location: [facility] OR;  Service: Orthopedics;  Laterality: Right;  . KNEE ARTHROSCOPY Bilateral   . REPLACEMENT TOTAL KNEE BILATERAL     1998 left , 1992 right   HPI:  83 y.o. female with a history of hypertension, hyperlipidemia, diabetes who presented with left-sided weakness and neglect. Pt with right MCA occlusion.  Bilateral common carotid arteriograms followed unsuccessful attempt at revascularization of Rt MCA occlusion due to prox Rt ICA occlusion despite multiple angioplasties.  intubated for procedure only. Failed RN stroke swallow screen.    Assessment / Plan / Recommendation Clinical Impression  Pt presents with s/s of dysphagia with risk of aspiration.  CN VII deficit right lower face; voice hoarse. Pt presents with impulsivity and impaired awareness, coughing after consumption of water but continuing to feed herself pudding, spilling it from mouth and talking simultaneously.  Consumption of nectars was safer with no overt s/s of aspiration.  Pt requires assist with feeding and pacing, cues to slow down and attend to task.  Recommend initiating a dysphagia 1 diet with nectar thick liquids; meds whole in puree. SLP will follow for safety/diet progression and to evaluate  necessity of MBS.  D/W RN.  SLP Visit Diagnosis: Dysphagia, unspecified (R13.10)    Aspiration Risk       Diet Recommendation   dysphagia 1, nectar thick liquids  Medication Administration: Whole meds with puree    Other  Recommendations Oral Care Recommendations: Oral care BID Other Recommendations: Order thickener from pharmacy   Follow up Recommendations Other (comment)(tba)      Frequency and Duration min 2x/week  2 weeks       Prognosis        Swallow Study   General Date of Onset: 04/04/18 HPI: 83 y.o. female with a history of hypertension, hyperlipidemia, diabetes who presented with left-sided weakness and neglect. Pt with right MCA occlusion.  Underwent emergent thrombectomy; intubated for procedure only. Failed RN stroke swallow screen.  Type of Study: Bedside Swallow Evaluation Previous Swallow Assessment: no Diet Prior to this Study: NPO Temperature Spikes Noted: No Respiratory Status: Room air History of Recent Intubation: Yes Length of Intubations (days): (for procedure only) Date extubated: 04/05/18 Behavior/Cognition: Alert Oral Cavity Assessment: Dry Oral Care Completed by SLP: Recent completion by staff Oral Cavity - Dentition: Edentulous Vision: Impaired for self-feeding Self-Feeding Abilities: Needs assist Patient Positioning: Upright in bed Baseline Vocal Quality: Hoarse Volitional Cough: Strong Volitional Swallow: Able to elicit    Oral/Motor/Sensory Function Overall Oral Motor/Sensory Function: Mild impairment Facial Symmetry: Abnormal symmetry left;Suspected CN VII (facial) dysfunction Facial Strength: Reduced left;Suspected CN VII (facial) dysfunction Facial Sensation: Within Functional Limits Lingual ROM: Within Functional Limits Lingual Symmetry: Within Functional Limits   Ice Chips Ice chips: Within functional limits   Thin  Liquid Thin Liquid: Impaired Presentation: Cup Oral Phase Functional Implications: Left anterior spillage;Right  anterior spillage Pharyngeal  Phase Impairments: Cough - Immediate    Nectar Thick Nectar Thick Liquid: Within functional limits   Honey Thick Honey Thick Liquid: Not tested   Puree Puree: Impaired Presentation: Spoon;Self Fed Oral Phase Impairments: Poor awareness of bolus Oral Phase Functional Implications: Right anterior spillage;Left anterior spillage   Solid     Solid: Not tested      Blenda Mounts Laurice 04/05/2018,11:24 AM   Marchelle Folks L. Samson Frederic, MA CCC/SLP Acute Rehabilitation Services Office number [phone] Pager [phone]

## 2018-04-05 NOTE — Progress Notes (Signed)
Assisted with teleconference between patient, her family, and bedside staff.

## 2018-04-05 NOTE — Progress Notes (Addendum)
Inpatient Diabetes Program Recommendations  AACE/ADA: New Consensus Statement on Inpatient Glycemic Control (2015)  Target Ranges:  Prepandial:   less than 140 mg/dL      Peak postprandial:   less than 180 mg/dL (1-2 hours)      Critically ill patients:  140 - 180 mg/dL   Lab Results  Component Value Date   GLUCAP 212 (H) 04/05/2018   HGBA1C 8.0 (H) 04/05/2018    Review of Glycemic Control  Diabetes history: type 2 Outpatient Diabetes medications: Lantus 17 units daily, Glucotrol XL 2.5 mg daily Current orders for Inpatient glycemic control: Novolog MODERATE correction scale TID    Inpatient Diabetes Program Recommendations:   Noted that patient's blood sugars have been greater than 180 mg/dl. Recommend adding Lantus 12 units daily (weight based) if patient eating sufficiently and CBGs continue to be elevated. Continue Novolog MODERATE correction scale TID as ordered. Patient takes Lantus 17 units daily at home.   Smith Mince RN BSN CDE Diabetes Coordinator Pager: [phone]  8am-5pm

## 2018-04-05 NOTE — Procedures (Signed)
S/P  Bilateral common carotid arteriograms followed unsuccessful attempt at revascularization of Rt MCA occlusion due to prox Rt ICA occlusion despite multiple angioplasties.

## 2018-04-05 NOTE — Progress Notes (Addendum)
Patient ID: Jessica Meyer, female   DOB: [birth year], 83 y.o.   MRN: [id number]   IR Rounding note via phone -- new regulations  CVA  Bilateral common carotid arteriograms followed unsuccessful attempt at revascularization of Rt MCA occlusion due to prox Rt ICA occlusion despite multiple angioplasties.   Pt is well per RN Left side weak Left vision is minimal  Speech is wnl Denies headache; denies pain Denies N/V  Groin site is clean and dry; NT No hematoma  PT/OT/ speech all have been consulted and seen pt  Dr Corliss Skains aware

## 2018-04-05 NOTE — Progress Notes (Signed)
Patient ID: Jessica Meyer, female   DOB: 05-01-1935, 83 y.o.   MRN: [id number] INR. 82 YR F RT H LSW 400pm New rt sided weakness neglect. CT Brain No ICH Aspects 10  CTA occluded RT MCA M1 and prox RT ICA. CTP maps revealed a core of 21ml with  A Tmax> 6.0s of 151 ml ,with a mismatch of 142. Premorbid mRSS 0. Endovascular treatment D/W patients daughter.Procedure,risks ,reasons and alternatives were reviewed.Risks of ICH of 10 %,neurological decline ,death ,inability to revascularize and vascular injury were discussed.Daughter expressed understanding and provided informed witnessed consent for the reatment. S.Sabah Zucco MD

## 2018-04-05 NOTE — Progress Notes (Signed)
OT Cancellation Note  Patient Details Name: Jessica Meyer MRN: [id number] DOB: Nov 08, 1935   Cancelled Treatment:    Reason Eval/Treat Not Completed: Active bedrest order; will follow.  Marcy Siren, OT Supplemental Rehabilitation Services Pager [phone] Office [phone]   Orlando Penner 04/05/2018, 8:09 AM

## 2018-04-05 NOTE — Progress Notes (Signed)
  Echocardiogram 2D Echocardiogram has been performed.  Marlet Korte G Zohaib Heeney 04/05/2018, 9:35 AM

## 2018-04-05 NOTE — Evaluation (Signed)
Physical Therapy Evaluation Patient Details Name: Jessica Meyer MRN: [id number] DOB: [birth year] Today's Date: 04/05/2018   History of Present Illness  83 yo admitted with left weakness and fall s/p unsuccessful IR attempt of Rt MCA CVA. PMhx: HTN, HLD, DM, arthritis, Rt hip hemi  Clinical Impression  Pt pleasant and admitted with above. PT demonstrates left inattention, right gaze, left hemiplegia with increased LLE extensor tone, impaired balance, function and mobility who will benefit from acute therapy to maximize mobility, function, safety and balance to decrease burden of care. Pt lives alone and would greatly benefit from CIr to reduce burden of care.   BP 146/68 end of session    Follow Up Recommendations CIR;Supervision/Assistance - 24 hour    Equipment Recommendations  Other (comment)(TBD with progression)    Recommendations for Other Services Rehab consult     Precautions / Restrictions Precautions Precautions: Fall Precaution Comments: left inattention and right gaze, increased extensor tone LLE      Mobility  Bed Mobility Overal bed mobility: Needs Assistance Bed Mobility: Supine to Sit     Supine to sit: Max assist;+2 for physical assistance     General bed mobility comments: physical assist to bring LLE to EOB and assist to elevate trunk. pt with posterior left lean and max assist for trunk control in sitting with cues and facilitation for anterior right translation of trunk  Transfers Overall transfer level: Needs assistance   Transfers: Sit to/from Stand;Stand Pivot Transfers Sit to Stand: Max assist;+2 safety/equipment;+2 physical assistance Stand pivot transfers: Max assist;+2 safety/equipment;+2 physical assistance       General transfer comment: max assist to stand from bed with bil UE supported and LLE blocked. Physical assist to pivot to chair and scoot back on surface. Pt with increased LLE extensor tone in standing.   Ambulation/Gait              General Gait Details: unable  Stairs            Wheelchair Mobility    Modified Rankin (Stroke Patients Only) Modified Rankin (Stroke Patients Only) Pre-Morbid Rankin Score: No symptoms Modified Rankin: Severe disability     Balance Overall balance assessment: Needs assistance   Sitting balance-Leahy Scale: Zero Sitting balance - Comments: max assist sitting balance due to posterior left lean Postural control: Posterior lean;Left lateral lean Standing balance support: Bilateral upper extremity supported Standing balance-Leahy Scale: Zero                               Pertinent Vitals/Pain Pain Assessment: No/denies pain    Home Living Family/patient expects to be discharged to:: Private residence Living Arrangements: Alone   Type of Home: House Home Access: Stairs to enter Entrance Stairs-Rails: Right Entrance Stairs-Number of Steps: 3 Home Layout: One level Home Equipment: Shower seat;Bedside commode;Cane - single point      Prior Function Level of Independence: Independent         Comments: lives alone performs ADLs, housework, cooking and puzzles, doesn't drive     Hand Dominance   Dominant Hand: Right    Extremity/Trunk Assessment   Upper Extremity Assessment Upper Extremity Assessment: Defer to OT evaluation    Lower Extremity Assessment Lower Extremity Assessment: LLE deficits/detail LLE Deficits / Details: extensor tone in bed  with ability to break into flexion, upon standing pt returns to extensor tone with increased plantar flexion. Pt with withdrawal to noxious stimuli  Cervical / Trunk Assessment Cervical / Trunk Assessment: Kyphotic;Other exceptions Cervical / Trunk Exceptions: left posterior lean  Communication   Communication: Expressive difficulties  Cognition Arousal/Alertness: Awake/alert Behavior During Therapy: WFL for tasks assessed/performed Overall Cognitive Status: Impaired/Different from  baseline Area of Impairment: Memory;Problem solving;Following commands;Safety/judgement;Awareness;Attention                   Current Attention Level: Sustained Memory: Decreased short-term memory Following Commands: Follows one step commands inconsistently;Follows one step commands with increased time Safety/Judgement: Decreased awareness of safety;Decreased awareness of deficits   Problem Solving: Slow processing;Decreased initiation;Difficulty sequencing;Requires verbal cues General Comments: pt unaware of comb in hand when presented to her. will turn head to left to visualize stimulus with increased cues and direction to task. Pt with inability to follow commands for balance but will reach with RUE with cues      General Comments      Exercises     Assessment/Plan    PT Assessment Patient needs continued PT services  PT Problem List Decreased strength;Decreased balance;Decreased cognition;Impaired tone;Decreased range of motion;Decreased mobility;Decreased knowledge of use of DME;Decreased activity tolerance;Decreased coordination;Decreased safety awareness;Impaired sensation       PT Treatment Interventions DME instruction;Functional mobility training;Balance training;Patient/family education;Neuromuscular re-education;Wheelchair mobility training;Therapeutic exercise;Cognitive remediation;Gait training;Therapeutic activities    PT Goals (Current goals can be found in the Care Plan section)  Acute Rehab PT Goals Patient Stated Goal: do puzzles, bake PT Goal Formulation: With patient Time For Goal Achievement: 04/19/18 Potential to Achieve Goals: Fair    Frequency Min 4X/week   Barriers to discharge Decreased caregiver support      Co-evaluation PT/OT/SLP Co-Evaluation/Treatment: Yes Reason for Co-Treatment: Complexity of the patient's impairments (multi-system involvement);For patient/therapist safety PT goals addressed during session: Mobility/safety with  mobility;Balance         AM-PAC PT "6 Clicks" Mobility  Outcome Measure Help needed turning from your back to your side while in a flat bed without using bedrails?: A Lot Help needed moving from lying on your back to sitting on the side of a flat bed without using bedrails?: Total Help needed moving to and from a bed to a chair (including a wheelchair)?: Total Help needed standing up from a chair using your arms (e.g., wheelchair or bedside chair)?: Total Help needed to walk in hospital room?: Total Help needed climbing 3-5 steps with a railing? : Total 6 Click Score: 7    End of Session Equipment Utilized During Treatment: Gait belt Activity Tolerance: Patient tolerated treatment well Patient left: in chair;with call bell/phone within reach;with chair alarm set Nurse Communication: Mobility status PT Visit Diagnosis: Other abnormalities of gait and mobility (R26.89);Muscle weakness (generalized) (M62.81);Difficulty in walking, not elsewhere classified (R26.2);Other symptoms and signs involving the nervous system (R29.898);Hemiplegia and hemiparesis Hemiplegia - Right/Left: Left Hemiplegia - dominant/non-dominant: Non-dominant Hemiplegia - caused by: Cerebral infarction    Time: 2563-8937 PT Time Calculation (min) (ACUTE ONLY): 21 min   Charges:   PT Evaluation $PT Eval Moderate Complexity: 1 Mod          Teya Otterson Abner Greenspan, PT Acute Rehabilitation Services Pager: [phone] Office: [phone]   Armine Rizzolo B Kiriana Worthington 04/05/2018, 12:26 PM

## 2018-04-06 LAB — GLUCOSE, CAPILLARY
Glucose-Capillary: 244 mg/dL — ABNORMAL HIGH (ref 70–99)
Glucose-Capillary: 181 mg/dL — ABNORMAL HIGH (ref 70–99)
Glucose-Capillary: 234 mg/dL — ABNORMAL HIGH (ref 70–99)
Glucose-Capillary: 153 mg/dL — ABNORMAL HIGH (ref 70–99)

## 2018-04-06 MED ORDER — INSULIN GLARGINE 100 UNIT/ML ~~LOC~~ SOLN
12.0000 [IU] | Freq: Every day | SUBCUTANEOUS | Status: DC
  Administered 2018-04-07: 12 [IU] via SUBCUTANEOUS
  Filled 2018-04-06 (×3): qty 0.12

## 2018-04-06 NOTE — Progress Notes (Signed)
STROKE TEAM PROGRESS NOTE  INTERVAL HISTORY Patient continues to do well. She is awake alert interactive. She still has dense left hemiplegia and left hemi-neglect. She has passed swallow eval. Therapy has evaluated her and recommended rehabilitation  Vitals:   04/06/18 0809 04/06/18 0846 04/06/18 0854 04/06/18 0900  BP: (!) 145/68   (!) 158/104  Pulse: 79 93 84 81  Resp: 16  18 20   Temp: 98.1 F (36.7 C)     TempSrc: Oral     SpO2:   98% 97%  Weight:      Height:        CBC:  Recent Labs  Lab 04/04/18 2130 04/05/18 0412  WBC 13.4* 12.9*  NEUTROABS 11.4* 11.2*  HGB 12.8 11.0*  HCT 40.2 33.9*  MCV 94.8 93.1  PLT 240 209    Basic Metabolic Panel:  Recent Labs  Lab 04/04/18 2130 04/04/18 2139 04/05/18 0412  NA 135  --  135  K 4.1  --  4.0  CL 102  --  106  CO2 20*  --  19*  GLUCOSE 247*  --  279*  BUN 17  --  15  CREATININE 1.13* 1.00 0.97  CALCIUM 9.9  --  8.8*   Lipid Panel:     Component Value Date/Time   CHOL 131 04/05/2018 0412   TRIG 130 04/05/2018 0412   HDL 42 04/05/2018 0412   CHOLHDL 3.1 04/05/2018 0412   VLDL 26 04/05/2018 0412   LDLCALC 63 04/05/2018 0412   HgbA1c:  Lab Results  Component Value Date   HGBA1C 8.0 (H) 04/05/2018   Urine Drug Screen: No results found for: LABOPIA, COCAINSCRNUR, LABBENZ, AMPHETMU, THCU, LABBARB  Alcohol Level     Component Value Date/Time   ETH <10 04/04/2018 2130    IMAGING Ct Angio Head W Or Wo Contrast  Result Date: 04/04/2018 CLINICAL DATA:  History of hypertension and hyperlipidemia. LEFT facial droop and LEFT extremity weakness. EXAM: CT ANGIOGRAPHY HEAD AND NECK CT PERFUSION BRAIN TECHNIQUE: Multidetector CT imaging of the head and neck was performed using the standard protocol during bolus administration of intravenous contrast. Multiplanar CT image reconstructions and MIPs were obtained to evaluate the vascular anatomy. Carotid stenosis measurements (when applicable) are obtained utilizing NASCET  criteria, using the distal internal carotid diameter as the denominator. Multiphase CT imaging of the brain was performed following IV bolus contrast injection. Subsequent parametric perfusion maps were calculated using RAPID software. CONTRAST:  OMNIPAQUE IOHEXOL 350 MG/ML SOLN COMPARISON:  CT HEAD April 04, 2018 at 2137 hours. FINDINGS: CTA NECK FINDINGS: AORTIC ARCH: Normal appearance of the thoracic arch, normal branch pattern. Mild calcific atherosclerosis aortic arch. The origins of the innominate, left Common carotid artery and subclavian artery are patent. Mild RIGHT and moderate LEFT subclavian artery stenosis due to atherosclerosis. RIGHT CAROTID SYSTEM: Common carotid artery is patent. Calcific atherosclerosis, occluded RIGHT internal carotid artery with thready faint recanalization. LEFT CAROTID SYSTEM: Common carotid artery is patent. Calcific atherosclerosis with short segment critical stenosis LEFT ICA origin. VERTEBRAL ARTERIES:Left vertebral artery is dominant. Severe stenosis bilateral vertebral artery origins. Patent vertebral arteries. SKELETON: No acute osseous process though bone windows have not been submitted. OTHER NECK: Soft tissues of the neck are nonacute though, not tailored for evaluation. C2-3 canal stenosis due to disc osteophyte complex. UPPER CHEST: Included lung apices are clear. No superior mediastinal lymphadenopathy. CTA HEAD FINDINGS: ANTERIOR CIRCULATION: RIGHT petrous 2 cavernous ICA occluded with minimal reconstitution at carotid siphon. LEFT internal carotid  artery is patent with moderate stenosis supraclinoid segment. Patent bilateral carotid termini. Emergent RIGHT M1 occlusion with intermediate collateralization by single-phase CTA. Patent anterior cerebral arteries and LEFT MCA. Moderate tandem stenosis LEFT ACA. No  contrast extravasation or aneurysm. POSTERIOR CIRCULATION: Patent vertebral arteries, vertebrobasilar junction and basilar artery, as well as main  branch vessels. Severe stenosis RIGHT V4 segment. Patent posterior cerebral arteries, mild tandem stenoses compatible with atherosclerosis. No large vessel occlusion, flow-limiting stenosis, contrast extravasation or aneurysm. VENOUS SINUSES: Major dural venous sinuses are patent though not tailored for evaluation on this angiographic examination. ANATOMIC VARIANTS: None. DELAYED PHASE: Not performed. MIP images reviewed. CT Brain Perfusion Findings: Moderate motion degraded examination. CBF (<30%) Volume: 13mL Perfusion (Tmax>6.0s) volume: 86mL Mismatch Volume: 73mL Infarction Location:RIGHT frontotemporal parietal penumbra with RIGHT basal ganglia, RIGHT periventricular and RIGHT temporal core infarcts. IMPRESSION: CTA NECK: 1. Occluded RIGHT ICA with trace reconstitution. 2. Critical stenosis LEFT ICA origin. 3. Severe stenosis bilateral vertebral artery origins, patent vertebral arteries. CTA HEAD: 1. Occluded RIGHT ICA with reconstitution at carotid terminus. Emergent RIGHT M1 occlusion with intermediate collaterals by single-phase CTA. CT PERFUSION: 1. Motion degraded examination. Large area RIGHT MCA penumbra with 9 cc RIGHT basal ganglia and possible temporal lobe core infarcts. 2. Moderate stenoses LEFT ACA. Critical Value/emergent results text paged to Dr.MCNEILL [facility] via AMION secure system on 04/04/2018 at 10:08 pm, including interpreting physician's phone number. Acute findings discussed with and reconfirmed by Dr.MCNEILL [facility] on 04/04/2018 at 10:20 pm. Aortic Atherosclerosis (ICD10-I70.0). Electronically Signed   By: Awilda Metro M.D.   On: 04/04/2018 22:21   Ct Angio Neck W Or Wo Contrast  Result Date: 04/04/2018 CLINICAL DATA:  History of hypertension and hyperlipidemia. LEFT facial droop and LEFT extremity weakness. EXAM: CT ANGIOGRAPHY HEAD AND NECK CT PERFUSION BRAIN TECHNIQUE: Multidetector CT imaging of the head and neck was performed using the standard protocol during bolus  administration of intravenous contrast. Multiplanar CT image reconstructions and MIPs were obtained to evaluate the vascular anatomy. Carotid stenosis measurements (when applicable) are obtained utilizing NASCET criteria, using the distal internal carotid diameter as the denominator. Multiphase CT imaging of the brain was performed following IV bolus contrast injection. Subsequent parametric perfusion maps were calculated using RAPID software. CONTRAST:  OMNIPAQUE IOHEXOL 350 MG/ML SOLN COMPARISON:  CT HEAD April 04, 2018 at 2137 hours. FINDINGS: CTA NECK FINDINGS: AORTIC ARCH: Normal appearance of the thoracic arch, normal branch pattern. Mild calcific atherosclerosis aortic arch. The origins of the innominate, left Common carotid artery and subclavian artery are patent. Mild RIGHT and moderate LEFT subclavian artery stenosis due to atherosclerosis. RIGHT CAROTID SYSTEM: Common carotid artery is patent. Calcific atherosclerosis, occluded RIGHT internal carotid artery with thready faint recanalization. LEFT CAROTID SYSTEM: Common carotid artery is patent. Calcific atherosclerosis with short segment critical stenosis LEFT ICA origin. VERTEBRAL ARTERIES:Left vertebral artery is dominant. Severe stenosis bilateral vertebral artery origins. Patent vertebral arteries. SKELETON: No acute osseous process though bone windows have not been submitted. OTHER NECK: Soft tissues of the neck are nonacute though, not tailored for evaluation. C2-3 canal stenosis due to disc osteophyte complex. UPPER CHEST: Included lung apices are clear. No superior mediastinal lymphadenopathy. CTA HEAD FINDINGS: ANTERIOR CIRCULATION: RIGHT petrous 2 cavernous ICA occluded with minimal reconstitution at carotid siphon. LEFT internal carotid artery is patent with moderate stenosis supraclinoid segment. Patent bilateral carotid termini. Emergent RIGHT M1 occlusion with intermediate collateralization by single-phase CTA. Patent anterior cerebral  arteries and LEFT MCA. Moderate tandem stenosis LEFT ACA.  No  contrast extravasation or aneurysm. POSTERIOR CIRCULATION: Patent vertebral arteries, vertebrobasilar junction and basilar artery, as well as main branch vessels. Severe stenosis RIGHT V4 segment. Patent posterior cerebral arteries, mild tandem stenoses compatible with atherosclerosis. No large vessel occlusion, flow-limiting stenosis, contrast extravasation or aneurysm. VENOUS SINUSES: Major dural venous sinuses are patent though not tailored for evaluation on this angiographic examination. ANATOMIC VARIANTS: None. DELAYED PHASE: Not performed. MIP images reviewed. CT Brain Perfusion Findings: Moderate motion degraded examination. CBF (<30%) Volume: 53mL Perfusion (Tmax>6.0s) volume: 48mL Mismatch Volume: 61mL Infarction Location:RIGHT frontotemporal parietal penumbra with RIGHT basal ganglia, RIGHT periventricular and RIGHT temporal core infarcts. IMPRESSION: CTA NECK: 1. Occluded RIGHT ICA with trace reconstitution. 2. Critical stenosis LEFT ICA origin. 3. Severe stenosis bilateral vertebral artery origins, patent vertebral arteries. CTA HEAD: 1. Occluded RIGHT ICA with reconstitution at carotid terminus. Emergent RIGHT M1 occlusion with intermediate collaterals by single-phase CTA. CT PERFUSION: 1. Motion degraded examination. Large area RIGHT MCA penumbra with 9 cc RIGHT basal ganglia and possible temporal lobe core infarcts. 2. Moderate stenoses LEFT ACA. Critical Value/emergent results text paged to Dr.MCNEILL [facility] via AMION secure system on 04/04/2018 at 10:08 pm, including interpreting physician's phone number. Acute findings discussed with and reconfirmed by Dr.MCNEILL [facility] on 04/04/2018 at 10:20 pm. Aortic Atherosclerosis (ICD10-I70.0). Electronically Signed   By: Awilda Metro M.D.   On: 04/04/2018 22:21   Mr Brain Wo Contrast  Result Date: 04/05/2018 CLINICAL DATA:  Left-sided weakness. Right M1 occlusion with unsuccessful  attempted revascularization due to proximal right ICA occlusion. EXAM: MRI HEAD WITHOUT CONTRAST TECHNIQUE: Multiplanar, multiecho pulse sequences of the brain and surrounding structures were obtained without intravenous contrast. COMPARISON:  Head CT, CTA, and CTP for 120 FINDINGS: Some sequences are moderately to severely motion degraded. Brain: There is a moderate-sized acute right MCA territory infarct involving the temporal lobe, insula, basal ganglia, and frontoparietal operculum. Additional small foci of predominantly cortical acute infarction are present more superiorly in the right frontal and parietal lobes. There are also multiple small acute infarcts involving left frontal and parietal cortex, left centrum semiovale, and superior left occipital lobe. No associated hemorrhage is evident within limitations of motion artifact. Multiple small chronic infarcts involving right frontoparietal cortex and white matter in a parasagittal distribution likely reflect remote watershed ischemia. Patchy T2 hyperintensities elsewhere in the cerebral white matter bilaterally are nonspecific but compatible with moderate chronic small vessel ischemic disease. There is moderate central predominant cerebral atrophy with asymmetric dilatation of the left lateral ventricle. Small chronic infarcts are present in the cerebellum bilaterally. No mass/mass effect or extra-axial fluid collection is identified. Vascular: Abnormal appearance of the proximal intracranial right ICA related to known proximal occlusion. Skull and upper cervical spine: Grossly unremarkable bone marrow signal. Sinuses/Orbits: Unremarkable orbits. Small left mastoid effusion. Clear paranasal sinuses. Other: None. IMPRESSION: 1. Moderate-sized acute right MCA infarct. 2. Additional punctate acute infarcts in the cerebrum bilaterally. 3. Moderate chronic small vessel ischemic disease with chronic lacunar infarcts as above. Electronically Signed   By: Sebastian Ache M.D.   On: 04/05/2018 14:34   Ct C-spine No Charge  Result Date: 04/04/2018 CLINICAL DATA:  Neck pain. EXAM: CT CERVICAL SPINE WITHOUT CONTRAST TECHNIQUE: Multidetector reformatted imaging of the cervical spine was performed from CTA NECK. COMPARISON:  None. FINDINGS: ALIGNMENT: Straightened lordosis.  No malalignment. SKULL BASE AND VERTEBRAE: Cervical vertebral bodies and posterior elements are intact. Moderate C2-3, C5-6 disc height loss, mild at C3-4 with endplate spurring compatible with degenerative  discs. C1-2 articulation maintained with mild arthropathy. Osteopenia without destructive bony lesions. Small RIGHT C7 rib. SOFT TISSUES AND SPINAL CANAL: Please see dedicated CTA from same day, reported separately for vascular findings. DISC LEVELS: Severe canal stenosis C2-3, mild at C3-4. Moderate RIGHT C2-3, RIGHT C3-4 neural foraminal narrowing. UPPER CHEST: Lung apices are clear. OTHER: None. IMPRESSION: 1. No fracture or malalignment. 2. Severe canal stenosis C2-3. Moderate C2-3 and C3-4 neural foraminal narrowing. Electronically Signed   By: Awilda Metro M.D.   On: 04/04/2018 23:32   Ct Cerebral Perfusion W Contrast  Result Date: 04/04/2018 CLINICAL DATA:  History of hypertension and hyperlipidemia. LEFT facial droop and LEFT extremity weakness. EXAM: CT ANGIOGRAPHY HEAD AND NECK CT PERFUSION BRAIN TECHNIQUE: Multidetector CT imaging of the head and neck was performed using the standard protocol during bolus administration of intravenous contrast. Multiplanar CT image reconstructions and MIPs were obtained to evaluate the vascular anatomy. Carotid stenosis measurements (when applicable) are obtained utilizing NASCET criteria, using the distal internal carotid diameter as the denominator. Multiphase CT imaging of the brain was performed following IV bolus contrast injection. Subsequent parametric perfusion maps were calculated using RAPID software. CONTRAST:  OMNIPAQUE IOHEXOL 350  MG/ML SOLN COMPARISON:  CT HEAD April 04, 2018 at 2137 hours. FINDINGS: CTA NECK FINDINGS: AORTIC ARCH: Normal appearance of the thoracic arch, normal branch pattern. Mild calcific atherosclerosis aortic arch. The origins of the innominate, left Common carotid artery and subclavian artery are patent. Mild RIGHT and moderate LEFT subclavian artery stenosis due to atherosclerosis. RIGHT CAROTID SYSTEM: Common carotid artery is patent. Calcific atherosclerosis, occluded RIGHT internal carotid artery with thready faint recanalization. LEFT CAROTID SYSTEM: Common carotid artery is patent. Calcific atherosclerosis with short segment critical stenosis LEFT ICA origin. VERTEBRAL ARTERIES:Left vertebral artery is dominant. Severe stenosis bilateral vertebral artery origins. Patent vertebral arteries. SKELETON: No acute osseous process though bone windows have not been submitted. OTHER NECK: Soft tissues of the neck are nonacute though, not tailored for evaluation. C2-3 canal stenosis due to disc osteophyte complex. UPPER CHEST: Included lung apices are clear. No superior mediastinal lymphadenopathy. CTA HEAD FINDINGS: ANTERIOR CIRCULATION: RIGHT petrous 2 cavernous ICA occluded with minimal reconstitution at carotid siphon. LEFT internal carotid artery is patent with moderate stenosis supraclinoid segment. Patent bilateral carotid termini. Emergent RIGHT M1 occlusion with intermediate collateralization by single-phase CTA. Patent anterior cerebral arteries and LEFT MCA. Moderate tandem stenosis LEFT ACA. No  contrast extravasation or aneurysm. POSTERIOR CIRCULATION: Patent vertebral arteries, vertebrobasilar junction and basilar artery, as well as main branch vessels. Severe stenosis RIGHT V4 segment. Patent posterior cerebral arteries, mild tandem stenoses compatible with atherosclerosis. No large vessel occlusion, flow-limiting stenosis, contrast extravasation or aneurysm. VENOUS SINUSES: Major dural venous sinuses are  patent though not tailored for evaluation on this angiographic examination. ANATOMIC VARIANTS: None. DELAYED PHASE: Not performed. MIP images reviewed. CT Brain Perfusion Findings: Moderate motion degraded examination. CBF (<30%) Volume: 13mL Perfusion (Tmax>6.0s) volume: 86mL Mismatch Volume: 73mL Infarction Location:RIGHT frontotemporal parietal penumbra with RIGHT basal ganglia, RIGHT periventricular and RIGHT temporal core infarcts. IMPRESSION: CTA NECK: 1. Occluded RIGHT ICA with trace reconstitution. 2. Critical stenosis LEFT ICA origin. 3. Severe stenosis bilateral vertebral artery origins, patent vertebral arteries. CTA HEAD: 1. Occluded RIGHT ICA with reconstitution at carotid terminus. Emergent RIGHT M1 occlusion with intermediate collaterals by single-phase CTA. CT PERFUSION: 1. Motion degraded examination. Large area RIGHT MCA penumbra with 9 cc RIGHT basal ganglia and possible temporal lobe core infarcts. 2. Moderate stenoses LEFT ACA.  Critical Value/emergent results text paged to Dr.MCNEILL [facility] via [employer]ION secure system on 04/04/2018 at 10:08 pm, including interpreting physician's phone number. Acute findings discussed with and reconfirmed by Dr.MCNEILL [facility] on 04/04/2018 at 10:20 pm. Aortic Atherosclerosis (ICD10-I70.0). Electronically Signed   By: Awilda Metro M.D.   On: 04/04/2018 22:21   Ct Head Code Stroke Wo Contrast  Result Date: 04/04/2018 CLINICAL DATA:  Code stroke. Last seen normal 1530 hours. LEFT facial droop. LEFT extremity weakness. EXAM: CT HEAD WITHOUT CONTRAST TECHNIQUE: Contiguous axial images were obtained from the base of the skull through the vertex without intravenous contrast. COMPARISON:  None. FINDINGS: Brain: Mild to moderate atrophy, not unexpected for age. Ventriculomegaly, hydrocephalus ex vacuo. Hypoattenuation of white matter, both focal and confluent, likely chronic microvascular ischemic change, but no clear areas of cortical hypodensity. No  hemorrhage, mass lesion, or extra-axial fluid. Vascular: Calcification of the cavernous internal carotid arteries and distal LEFT vertebral artery consistent with cerebrovascular atherosclerotic disease. Asymmetric hyperattenuation RIGHT MCA in the sylvian fissure, could reflect acute thrombus in one or both M2 MCA branches. Skull: Normal. Negative for fracture or focal lesion. Sinuses/Orbits: No acute finding. Other: None. ASPECTS [facility] Stroke Program Early CT Score) - Ganglionic level infarction (caudate, lentiform nuclei, internal capsule, insula, M1-M3 cortex): 7 - Supraganglionic infarction (M4-M6 cortex): 3 Total score (0-10 with 10 being normal): 10 IMPRESSION: 1. Asymmetric hyperattenuation RIGHT MCA in the Sylvian fissure could reflect acute thrombus in one or both M2 branches. 2. Atrophy and small vessel disease. 3. ASPECTS is 10. These results were communicated to Dr. Amada Jupiter at 9:46 pmon 4/1/2020by text page via the [facility] messaging system. * Electronically Signed   By: Elsie Stain M.D.   On: 04/04/2018 21:49   Cerebral angio S/P  Bilateral common carotid arteriograms followed unsuccessful attempt at revascularization of Rt MCA occlusion due to prox Rt ICA occlusion despite multiple angioplasties.  2D Echocardiogram   1. The left ventricle has hyperdynamic systolic function, with an ejection fraction of >65%. The cavity size was normal. There is mildly increased left ventricular wall thickness. Left ventricular diastolic Doppler parameters are indeterminate. No  evidence of left ventricular regional wall motion abnormalities.  2. The right ventricle has normal systolic function. The cavity was normal. There is no increase in right ventricular wall thickness.  3. The mitral valve is grossly normal.  4. The tricuspid valve is grossly normal.  5. The aortic root and ascending aorta are normal in size and structure.   PHYSICAL EXAM: Pleasant elderly Caucasian lady not in distress. .  Afebrile. Head is nontraumatic. Neck is supple without bruit.    Cardiac exam no murmur or gallop. Lungs are clear to auscultation. Distal pulses are well felt. Neurological Exam ;  She is awake and alert oriented 2. Diminished attention, registration and recall. No aphasia,apraxia dysarthria. Forced right gaze duration. Unable to look to the left. Blinks to threat on the right but not on the left.Mild left hemibody neglect and left hemi-visual neglect.left lower facial weakness. Tongue midline. Spontaneous antigravity movements on the right side without weakness. And left hemiplegia with only trace withdrawal to painful stimuli in the lower extremities. Tone is diminished on the left and normal on the right. Left plantar upgoing right downgoing.  ASSESSMENT/PLAN Jessica Meyer is a 83 y.o. female with history of HTN, HLD, DB found down with L sided weakness. No tPA d/t out of the window.   Stroke:  R MCA and B cerebral infarcts in setting  of R ICA and R MCA occlusion s/p unsuccessful IR, infarcts secondary to large vessel disease from proximal right ICA stenosis/occlusion  Code Stroke CT head asymmetric R MCA ? Thrombus one or both M2 branches. Small vessel disease. Atrophy. ASPECTS 10.     CTA head occluded R ICA. ELVO R M1 occlusion  CTA neck occluded R ICA. Critical stenosis L ICA origin. Severe B VA stenosis origins.   CT perfusion large R MCA penumbra w/ 9xx F BG and possible temporal lobe infarcts. Mod stenosis L ACA.  CT CS no fx. Severe C2-3 canal stenosis. Mod C2-3 and C3-4 neural foraminal narrowing  Cerebral angio attempted revascularization R MCA, R ICA occlusion  Post IR CT results not documented  MRI  Mod R MCA infarct. Addition B cerebellar infarcts. Small vessel disease. Chronic lacunes.   2D Echo EF >65%. No source of embolus   LDL 63  HgbA1c 8.0  SCDs for VTE prophylaxis  aspirin 81 mg daily prior to admission, now on aspirin 81 mg daily and clopidogrel 75 mg  daily. Continue x 3 months then PLAVIX alone  Therapy recommendations:  CIR. Consult in place  Disposition:  pending   DNR at pt request  Transfer to floor  Hope for d/c to CIR on Monday  Dysphagia   Secondary to stroke   On D1 nectar thick diet  Headache  Secondary to stroke  Ok tylenol supp  Add Tylenol #3 once able to swallow  Hypertension  Stable . BP goal per IR x 24h . Treated w/ cleviprex . Home meds: norvasc 5, coreg 12.5 bid, lisinopril 40 . Resume home meds  Hyperlipidemia  Home meds:  lipitor 10, resumed in hospital  LDL 63, goal < 70  Continue statin at discharge  Diabetes type II, uncontrolled  HgbA1c 8.0, goal < 7.0  Home meds: lantus 17, glipizide 2.5  Glucoses 164-244  Mod SSI tid  Add lantus 12 as recommended by DB RN  Other Stroke Risk Factors  Advanced age  Hx snuff use  ETOH use, advised to drink no more than 1 drink(s) a day  Other Active Problems  Acute blood loss anemia s/p IR, Hgb 12.8->11.0  Arthritis  Leukocytosis 13.4->12.9 . rehceck in am  Hospital day # 2  The patient remains neurologically stable. She has passed swallow eval. Recommend mobilize out of bed. Continue ongoing therapies and transferred to rehabilitation bed when available after insurance approval. Continue aspirin and Plavix for 3 months followed by aspirin alone. Aggressive risk factor modification. Discussed with patient's daughter over the phone and with rehabilitation team.This patient is critically ill and at significant risk of neurological worsening, death and care requires constant monitoring of vital signs, hemodynamics,respiratory and cardiac monitoring, extensive review of multiple databases, frequent neurological assessment, discussion with family, other specialists and medical decision making of high complexity.I have made any additions or clarifications directly to the above note.This critical care time does not reflect procedure time, or  teaching time or supervisory time of PA/NP/Med Resident etc but could involve care discussion time.  I spent 30 minutes of neurocritical care time  in the care of  this patient.      Delia Heady, MD Medical Director [facility] Stroke Center Pager: [phone] 04/06/2018 10:33 AM   To contact Stroke Continuity provider, please refer to [email or web address]. After hours, contact General Neurology

## 2018-04-06 NOTE — PMR Pre-admission (Signed)
PMR Admission Coordinator Pre-Admission Assessment  Patient: Jessica Meyer is an 83 y.o., female MRN: [id number] DOB: 07-01-1935 Height: _0  (167.6 cm) Weight: 63.1 kg  Insurance Information HMO:     PPO:      PCP:      IPA:      80/20: Yes     OTHER:  PRIMARY:  Medicare Part A and B     Policy#: [id number]      Subscriber: Patient CM Name:       Phone#:      Fax#:  Pre-Cert#:       Employer:  Benefits:  Phone #: NA     Name: verified eligibility via onesource on 04/06/18 Eff. Date: Part A effective 12/04/02; Part B effective: 12/04/02     Deduct: $1,408      Out of Pocket Max: NA      Life Max: NA CIR: Covered per Medicare Guidelines once yearly deductible is met.       SNF: days 1-20, 100%, days 21-100, 80% Outpatient: 80%     Co-Pay: 20% Home Health: 100%      Co-Pay:  DME: 80%     Co-Pay: 20% Providers: Pt's choice SECONDARY: BCBS      Policy#: [id number]      Subscriber: Patient CM Name:       Phone#:      Fax#:  Pre-Cert#:       Employer:  Benefits:  Phone #: [phone]     Name:  Eff. Date:      Deduct:       Out of Pocket Max:       Life Max:  CIR:       SNF:  Outpatient:      Co-Pay:  Home Health:       Co-Pay:  DME:      Co-Pay:   Medicaid Application Date:       Case Manager:  Disability Application Date:       Case Worker:   The "Data Collection Information Summary" for patients in Inpatient Rehabilitation Facilities with attached "Privacy Act [place] Records" was provided and verbally reviewed with: Family  Emergency Contact Information Contact Information    Name Relation Home Work Mobile   Grant,Debbie Daughter [phone]     Grant,Sidney Relative [phone]        Current Medical History  Patient Admitting Diagnosis: R MCA and B cerebral infarcts in setting of R ICA and R MCA occlusion   History of Present Illness: Jessica Meyer is an 83 year old female with history of hypertension, hyperlipidemia, diabetes mellitus, bilateral  total knee replacement. Per chart review patient lives alone. Pt presented 04/04/2018 with fall and left side weakness. No loss of consciousness. CT of the head showed asymmetric hyperattenuation right MCA in the sylvian fissure. CT angiogram of head and neck showed occluded right ICA with trace reconstitution. Critical stenosis left ICA origin. Severe stenosis of bilateral vertebral artery origins, patent vertebral arteries. CT perfusion scan showed a large area right MCA penumbra with 9 mL right basal ganglia and possible temporal lobe cortical infarction. MRI shows moderate size acute right MCA infarction with additional punctate acute infarctions cerebellum bilaterally. Attempts at mechanical thrombectomy unsuccessful. Echocardiogram with ejection fraction of 65% hyperdynamic systolic function.No source of embolus. Pt currently on a Dysphagia #1 diet with nectar thick liquid.  Therapy evaluation completed with recommendations for CIR. Patient is to be admitted for a comprehensive rehabilitation program on 04/06/18.  Complete  NIHSS TOTAL: 14  Patient's medical record from [facility] has been reviewed by the rehabilitation admission coordinator and physician.  Past Medical History  Past Medical History:  Diagnosis Date  . Arthritis   . Diabetes mellitus without complication ([place])   . History of chicken pox   . Hyperlipidemia   . Hypertension   . Thyroid disease    hypothyroid    Family History   family history includes Arthritis in her mother; Depression in her mother; Heart attack in her father; Hypertension in her father.  Prior Rehab/Hospitalizations Has the patient had prior rehab or hospitalizations prior to admission? No  Has the patient had major surgery during 100 days prior to admission? Yes   Current Medications  Current Facility-Administered Medications:  .  acetaminophen (TYLENOL) tablet 650 mg, 650 mg, Oral, Q4H PRN, 650 mg at 04/07/18 0356 **OR**  acetaminophen (TYLENOL) solution 650 mg, 650 mg, Per Tube, Q4H PRN **OR** acetaminophen (TYLENOL) suppository 650 mg, 650 mg, Rectal, Q4H PRN, Luanne Bras, MD, 650 mg at 04/05/18 0845 .  acetaminophen-codeine (TYLENOL #3) 300-30 MG per tablet 1 tablet, 1 tablet, Oral, Q6H PRN, Donzetta Starch, NP, 1 tablet at 04/06/18 0854 .  amLODipine (NORVASC) tablet 5 mg, 5 mg, Oral, Daily, Biby, Sharon L, NP, 5 mg at 04/06/18 0854 .  aspirin EC tablet 81 mg, 81 mg, Oral, Daily, Biby, Sharon L, NP, 81 mg at 04/06/18 0854 .  atorvastatin (LIPITOR) tablet 10 mg, 10 mg, Oral, QHS, Biby, Sharon L, NP, 10 mg at 04/06/18 2140 .  buPROPion (WELLBUTRIN XL) 24 hr tablet 300 mg, 300 mg, Oral, Daily, Biby, Sharon L, NP, 300 mg at 04/06/18 0855 .  carvedilol (COREG) tablet 12.5 mg, 12.5 mg, Oral, BID WC, Biby, Sharon L, NP, 12.5 mg at 04/07/18 0826 .  clopidogrel (PLAVIX) tablet 75 mg, 75 mg, Oral, Daily, Garvin Fila, MD, 75 mg at 04/06/18 0847 .  escitalopram (LEXAPRO) tablet 5 mg, 5 mg, Oral, QHS, Biby, Sharon L, NP, 5 mg at 04/06/18 2141 .  glipiZIDE (GLUCOTROL XL) 24 hr tablet 2.5 mg, 2.5 mg, Oral, QAC breakfast, Leonie Man, Pramod S, MD, 2.5 mg at 04/07/18 0826 .  hydrOXYzine (ATARAX/VISTARIL) tablet 50 mg, 50 mg, Oral, QHS, Garvin Fila, MD, 50 mg at 04/06/18 2140 .  insulin aspart (novoLOG) injection 0-15 Units, 0-15 Units, Subcutaneous, TID WC, Greta Doom, MD, 5 Units at 04/07/18 0827 .  insulin glargine (LANTUS) injection 12 Units, 12 Units, Subcutaneous, QHS, Biby, Sharon L, NP, 12 Units at 04/07/18 0012 .  levothyroxine (SYNTHROID, LEVOTHROID) tablet 62.5 mcg, 62.5 mcg, Oral, QAC breakfast, Biby, Sharon L, NP, 62.5 mcg at 04/07/18 0614 .  lisinopril (PRINIVIL,ZESTRIL) tablet 40 mg, 40 mg, Oral, Daily, Biby, Sharon L, NP, 40 mg at 04/06/18 8786  Patients Current Diet:  Diet Order            DIET - DYS 1 Room service appropriate? Yes; Fluid consistency: Nectar Thick  Diet effective now               Precautions / Restrictions Precautions Precautions: Fall Precaution Comments: left inattention and right gaze, increased extensor tone LLE and Flexion tone LUE at shoulder and elbow Restrictions Weight Bearing Restrictions: No   Has the patient had 2 or more falls or a fall with injury in the past year? No  Prior Activity Level Community (5-7x/wk): pt was retired and not driving but would do grocery shopping with family, walk neighborhood and  get out of the house 3-4x/week  Prior Functional Level Self Care: Did the patient need help bathing, dressing, using the toilet or eating? Independent  Indoor Mobility: Did the patient need assistance with walking from room to room (with or without device)? Independent  Stairs: Did the patient need assistance with internal or external stairs (with or without device)? Independent  Functional Cognition: Did the patient need help planning regular tasks such as shopping or remembering to take medications? Independent  Home Assistive Devices / Equipment Home Equipment: Shower seat, Bedside commode, Cane - single point  Prior Device Use: Indicate devices/aids used by the patient prior to current illness, exacerbation or injury? has cane but does not use regularily   Current Functional Level Cognition  Arousal/Alertness: Awake/alert Overall Cognitive Status: Impaired/Different from baseline Current Attention Level: Sustained Orientation Level: Oriented to person, Oriented to place, Disoriented to time, Disoriented to situation Following Commands: Follows one step commands inconsistently, Follows one step commands with increased time Safety/Judgement: Decreased awareness of safety, Decreased awareness of deficits General Comments: Improved ability to follow one step commands this session with increased time and redirection to task. Hyperfocused on "I can't move my left side," when asked to perform a task Attention: Sustained Sustained  Attention: Impaired Sustained Attention Impairment: Verbal basic Memory: Impaired Memory Impairment: Retrieval deficit Awareness: Impaired Awareness Impairment: Emergent impairment Problem Solving: Impaired Problem Solving Impairment: Verbal basic, Functional basic Executive Function: Reasoning Reasoning: Impaired Behaviors: Impulsive Safety/Judgment: Impaired    Extremity Assessment (includes Sensation/Coordination)  Upper Extremity Assessment: LUE deficits/detail LUE Deficits / Details: initially appears flaccid though after transfer to recliner noted increased tone/spasticity, some withdrawal noted to painful stimuli LUE Coordination: decreased fine motor, decreased gross motor  Lower Extremity Assessment: Defer to PT evaluation LLE Deficits / Details: extensor tone in bed  with ability to break into flexion, upon standing pt returns to extensor tone with increased plantar flexion. Pt with withdrawal to noxious stimuli     ADLs  Overall ADL's : Needs assistance/impaired Eating/Feeding: NPO Eating/Feeding Details (indicate cue type and reason): pt perseverating on wanting water this session, SLP following up today for swallow eval Grooming: Brushing hair, Minimal assistance, Sitting Grooming Details (indicate cue type and reason): required max questioning cues to notice she had comb in her hand, once redirected to task pt able to perform while in supported sitting in recliner Upper Body Bathing: Moderate assistance, Sitting Lower Body Bathing: Maximal assistance, +2 for physical assistance, +2 for safety/equipment, Sitting/lateral leans, Sit to/from stand Upper Body Dressing : Moderate assistance, Sitting Lower Body Dressing: +2 for physical assistance, +2 for safety/equipment, Maximal assistance, Sit to/from stand Toilet Transfer: Maximal assistance, +2 for physical assistance, +2 for safety/equipment, Stand-pivot Toilet Transfer Details (indicate cue type and reason): with total A  for weight shifting for pt to step with RLE Toileting- Clothing Manipulation and Hygiene: Maximal assistance, +2 for physical assistance, +2 for safety/equipment, Sit to/from stand Functional mobility during ADLs: Maximal assistance, +2 for physical assistance, +2 for safety/equipment(stand pivot transfer ) General ADL Comments: pt with significant L lateral lean, poor sitting and standing balance, L inattention, impaired cognition    Mobility  Overal bed mobility: Needs Assistance Bed Mobility: Supine to Sit Supine to sit: Max assist General bed mobility comments: physical assist to bring LLE to EOB going towards left side and assist to elevate trunk. hand over hand guidance for utilization of rail on left side    Transfers  Overall transfer level: Needs assistance Equipment used: 2 person  hand held assist Transfers: Sit to/from Stand, Stand Pivot Transfers Sit to Stand: Max assist, +2 safety/equipment, +2 physical assistance Stand pivot transfers: Max assist, +2 safety/equipment, +2 physical assistance General transfer comment: max assist +2 to stand from bed with bil UE supported and LLE blocked. Hyperextension of L knee noted; manual facilitation provided for increased L ankle dorsiflexion. Physical assist to pivot to chair and scoot back on surface.     Ambulation / Gait / Stairs / Wheelchair Mobility  Ambulation/Gait General Gait Details: unable    Posture / Balance Dynamic Sitting Balance Sitting balance - Comments: varying levels of assist from max assist to intermittent periods of min guard for static sitting balance. Pt exhibits decreased trunk/core control with initial heavy right lateral lean. Manual facilitation provided with visual cueing for achieving midline position; pt unable to maintain for significant period of time Balance Overall balance assessment: Needs assistance Sitting balance-Leahy Scale: Poor Sitting balance - Comments: varying levels of assist from max assist  to intermittent periods of min guard for static sitting balance. Pt exhibits decreased trunk/core control with initial heavy right lateral lean. Manual facilitation provided with visual cueing for achieving midline position; pt unable to maintain for significant period of time Postural control: Posterior lean, Left lateral lean Standing balance support: Bilateral upper extremity supported Standing balance-Leahy Scale: Zero    Special needs/care consideration BiPAP/CPAP : no CPM : no Continuous Drip IV : no Dialysis : no        Days : no Life Vest : no Oxygen : no, RA Special Bed : no Trach Size : no Wound Vac (area) : no      Location : no Skin: surgical incision           Bowel mgmt: PTA, unknown Bladder mgmt: incontinent  Diabetic mgmt: yes Behavioral consideration : restless Chemo/radiation : no   Previous Home Environment (from acute therapy documentation) Living Arrangements: Alone  Lives With: Alone Available Help at Discharge: Family Type of Home: House Home Layout: One level Home Access: Stairs to enter Entrance Stairs-Rails: Right Entrance Stairs-Number of Steps: 3 Bathroom Shower/Tub: Chiropodist: Standard Home Care Services: No  Discharge Living Setting Plans for Discharge Living Setting: Other (Comment)(daughter's house ) Type of Home at Discharge: House Discharge Home Layout: Two level, Able to live on main level with bedroom/bathroom Alternate Level Stairs-Rails: None(NA) Alternate Level Stairs-Number of Steps: NA Discharge Home Access: Stairs to enter Entrance Stairs-Rails: None Entrance Stairs-Number of Steps: 2-3 steps Discharge Bathroom Shower/Tub: Tub/shower unit Discharge Bathroom Toilet: Standard Discharge Bathroom Accessibility: No Does the patient have any problems obtaining your medications?: No  Social/Family/Support Systems Patient Roles: Other (Comment)(has grown children; grandchildren nearby; takes care of dog) Contact  Information: daughter: [name]: [phone] Anticipated Caregiver: [name] and grandchildren Anticipated Caregiver's Contact Information: see above Ability/Limitations of Caregiver: mod A Caregiver Availability: 24/7 Discharge Plan Discussed with Primary Caregiver: Yes Is Caregiver In Agreement with Plan?: Yes Does Caregiver/Family have Issues with Lodging/Transportation while Pt is in Rehab?: No  Goals/Additional Needs Patient/Family Goal for Rehab: PT/OT: Mod A; SLP:MIn A  Expected length of stay: 14-18 days Cultural Considerations: [employer] Dietary Needs: DYS 1, nectar thick liquid Equipment Needs: TBD Pt/Family Agrees to Admission and willing to participate: Yes Program Orientation Provided & Reviewed with Pt/Caregiver Including Roles  & Responsibilities: Yes(pt and daughter)  Barriers to Discharge: Home environment access/layout  Barriers to Discharge Comments: steps to enter; tub shower   Decrease burden of Care through IP  rehab admission: NA  Possible need for SNF placement upon discharge: Not anticipated; pt has family assistance at DC that are aware she will need 24/7 hands on assistance at DC. Daughter able to take FMLA and grandchildren to assist as well.   Patient Condition: I have reviewed medical records from [facility], spoken with RN, and patient and daughter. I met with patient at the bedside for inpatient rehabilitation assessment and discussed program details with pt's daughter via phone  Patient will benefit from ongoing PT, OT and SLP, can actively participate in 3 hours of therapy a day 5 days of the week, and can make measurable gains during the admission.  Patient will also benefit from the coordinated team approach during an Inpatient Acute Rehabilitation admission.  The patient will receive intensive therapy as well as Rehabilitation physician, nursing, social worker, and care management interventions.  Due to bladder management, bowel  management, safety, skin/wound care, disease management, medication administration, pain management and patient education the patient requires 24 hour a day rehabilitation nursing.  The patient is currently Max A x2 for transfers and basic ADLs.  Discharge setting and therapy post discharge at home with home health is anticipated.  Patient has agreed to participate in the Acute Inpatient Rehabilitation Program and will admit tomorrow.  Preadmission Screen Completed By:  Jhonnie Garner, 04/07/2018 10:35 AM ______________________________________________________________________   Discussed status with Dr. Posey Pronto on 04/06/18 at 12:18PM and received approval for admission 04/07/18.  Admission Coordinator:  Jhonnie Garner, OT, time 10:22PM/Date 04/07/18   Assessment/Plan: Diagnosis: R MCA and B cerebral infarcts in setting of R ICA and R MCA occlusion  1. Does the need for close, 24 hr/day Medical supervision in concert with the patient's rehab needs make it unreasonable for this patient to be served in a less intensive setting? Yes  2. Co-Morbidities requiring supervision/potential complications: hypertension, hyperlipidemia, diabetes mellitus, bilateral total knee replacement 3. Due to bladder management, bowel management, safety, disease management, medication administration and patient education, does the patient require 24 hr/day rehab nursing? Yes 4. Does the patient require coordinated care of a physician, rehab nurse, PT (1-2 hrs/day, 5 days/week), OT (1-2 hrs/day, 5 days/week) and SLP (1-2 hrs/day, 5 days/week) to address physical and functional deficits in the context of the above medical diagnosis(es)? Yes Addressing deficits in the following areas: balance, endurance, locomotion, strength, transferring, bathing, dressing, toileting, cognition, speech, language, swallowing and psychosocial support 5. Can the patient actively participate in an intensive therapy program of at least 3 hrs of therapy 5 days a  week? Yes 6. The potential for patient to make measurable gains while on inpatient rehab is excellent 7. Anticipated functional outcomes upon discharge from inpatients are: min assist PT, min assist OT, modified independent and supervision SLP 8. Estimated rehab length of stay to reach the above functional goals is: 18-23 days. 9. Anticipated D/C setting: Home 10. Anticipated post D/C treatments: HH therapy and Home excercise program 11. Overall Rehab/Functional Prognosis: good  MD Signature: Delice Lesch, MD, ABPMR

## 2018-04-06 NOTE — Progress Notes (Signed)
Inpatient Rehabilitation-Admissions Coordinator    Met with patient at the bedside (and spoke with her daughter [name] on the phone) to discuss team's recommendation for inpatient rehabilitation. Shared booklets, expectations while in CIR, expected length of stay, and anticipated functional level at DC. Pt and family indicate interest in CIR and would like to pursue admission once appropriate. Family has confirmed caregiver support and all questions answered regarding program involvement at this time.   Pt lethargic this AM. Not sure she will be able to tolerate CIR at this time but will follow for possible admission in the next few days.   Please call if questions.   Jhonnie Garner, OTR/L  Rehab Admissions Coordinator  [phone] 04/06/2018 11:08 AM

## 2018-04-06 NOTE — Progress Notes (Signed)
  Speech Language Pathology Treatment: Dysphagia;Cognitive-Linquistic  Patient Details Name: Jessica Meyer MRN: [id number] DOB: 1935-06-13 Today's Date: 04/06/2018 Time: 5009-3818 SLP Time Calculation (min) (ACUTE ONLY): 24 min  Assessment / Plan / Recommendation Clinical Impression  Cooperative, drowsy with SLP assisting to donn dentures (used therapists denture cream) and hearing aids. Mod-max verbal cues to locate therapist, cup etc. in left visual field. Immediate cough with trials thin water, prevented with nectar thick juice indicative of decreased airway protection. Left pocketing and spill with nectar without awareness/sensation. Mild residue with trial of solid coupled with drowsiness result in continued recommendation of puree texture at this time. Nectar thick most appropriate and will need MBS prior to upgrade when appropriate.   Independently oriented to place, situation/dx; min prompt for month and max verbal cues for use of calendar- stimulable to cue "turn your head to the left".    HPI HPI: 83 y.o. female with a history of hypertension, hyperlipidemia, diabetes who presented with left-sided weakness and neglect. Pt with right MCA occlusion.   Bilateral common carotid arteriograms followed unsuccessful attempt at revascularization of Rt MCA occlusion due to prox Rt ICA occlusion despite multiple angioplasties. intubated for procedure only. Failed RN stroke swallow screen.       SLP Plan  Continue with current plan of care       Recommendations  Diet recommendations: Dysphagia 1 (puree);Nectar-thick liquid Liquids provided via: Cup Medication Administration: Crushed with puree Supervision: Patient able to self feed;Full supervision/cueing for compensatory strategies Compensations: Slow rate;Minimize environmental distractions;Small sips/bites;Lingual sweep for clearance of pocketing Postural Changes and/or Swallow Maneuvers: Seated upright 90 degrees                Oral Care Recommendations: Oral care BID Follow up Recommendations: Inpatient Rehab SLP Visit Diagnosis: Dysphagia, unspecified (R13.10);Cognitive communication deficit (E99.371) Plan: Continue with current plan of care                       Royce Macadamia 04/06/2018, 11:24 AM   Jessica Meyer.Ed Nurse, children's [phone] Office [phone]

## 2018-04-06 NOTE — Progress Notes (Signed)
Occupational Therapy Treatment Patient Details Name: Jessica Meyer MRN: [id number] DOB: 05-18-1935 Today's Date: 04/06/2018    History of present illness 83 yo admitted with left weakness and fall s/p unsuccessful IR attempt of Rt MCA CVA. PMhx: HTN, HLD, DM, arthritis, Rt hip hemi   OT comments  This 83 yo female admitted with above presents to acute OT attending to left more with verbal cues, following cues for balance but over correcting. She will continue to benefit from acute OT with follow up on CIR.   Follow Up Recommendations  CIR;Supervision/Assistance - 24 hour    Equipment Recommendations  Other (comment)(TBD next venue)    Recommendations for Other Services Rehab consult    Precautions / Restrictions Precautions Precautions: Fall Precaution Comments: left inattention and right gaze, increased extensor tone LLE and Flexion tone LUE at shoulder and elbow Restrictions Weight Bearing Restrictions: No       Mobility Bed Mobility Overal bed mobility: Needs Assistance Bed Mobility: Supine to Sit     Supine to sit: Max assist     General bed mobility comments: physical assist to bring LLE to EOB going towards left side and assist to elevate trunk. hand over hand guidance for utilization of rail on left side  Transfers Overall transfer level: Needs assistance Equipment used: 2 person hand held assist Transfers: Sit to/from Stand;Stand Pivot Transfers Sit to Stand: Max assist;+2 safety/equipment;+2 physical assistance Stand pivot transfers: Max assist;+2 safety/equipment;+2 physical assistance       General transfer comment: max assist +2 to stand from bed with bil UE supported and LLE blocked. Hyperextension of L knee noted; manual facilitation provided for increased L ankle dorsiflexion. Physical assist to pivot to chair and scoot back on surface.     Balance Overall balance assessment: Needs assistance   Sitting balance-Leahy Scale: Poor Sitting balance -  Comments: varying levels of assist from max assist to intermittent periods of min guard for static sitting balance. Pt exhibits decreased trunk/core control with initial heavy right lateral lean. Manual facilitation provided with visual cueing for achieving midline position; pt unable to maintain for significant period of time Postural control: Posterior lean;Left lateral lean Standing balance support: Bilateral upper extremity supported Standing balance-Leahy Scale: Zero                             ADL either performed or assessed with clinical judgement   ADL Overall ADL's : Needs assistance/impaired                         Toilet Transfer: Maximal assistance;+2 for physical assistance;+2 for safety/equipment;Stand-pivot Toilet Transfer Details (indicate cue type and reason): with total A for weight shifting for pt to step with RLE                 Vision Baseline Vision/History: Wears glasses Wears Glasses: At all times Patient Visual Report: ("where are you, I can't see you" when to left) Vision Assessment?: Yes Eye Alignment: Within Functional Limits Ocular Range of Motion: Restricted on the left Alignment/Gaze Preference: Gaze right Additional Comments: pt turn head completely to left with multiple verbal cues but eyes do not cross midline. She was able to locate all 3 of [place] in room (one on right, one on left of midline and one far left) with increased cues to look for left          Cognition Arousal/Alertness: Awake/alert Behavior  During Therapy: WFL for tasks assessed/performed Overall Cognitive Status: Impaired/Different from baseline Area of Impairment: Memory;Problem solving;Following commands;Safety/judgement;Awareness;Attention                   Current Attention Level: Sustained Memory: Decreased short-term memory Following Commands: Follows one step commands inconsistently;Follows one step commands with increased  time Safety/Judgement: Decreased awareness of safety;Decreased awareness of deficits Awareness: Intellectual Problem Solving: Slow processing;Decreased initiation;Difficulty sequencing;Requires verbal cues General Comments: Improved ability to follow one step commands this session with increased time and redirection to task. Hyperfocused on "I can't move my left side," when asked to perform a task        Exercises Exercises: Other exercises Other Exercises Other Exercises: Sitting balance: Bilateral rhythmic rotation towards right and left side. Left lateral leans onto elbow for weightbearing to promote left sided awareness.  Other Exercises: Pt with increased tone LUE at shoulder and elbow           Pertinent Vitals/ Pain       Pain Assessment: Faces Faces Pain Scale: Hurts little more Pain Location: left foot with weightbearing Pain Descriptors / Indicators: Burning Pain Intervention(s): Monitored during session;Repositioned  Home Living Family/patient expects to be discharged to:: Private residence Living Arrangements: Alone Available Help at Discharge: Family Type of Home: House Home Access: Stairs to enter Secretary/administrator of Steps: 3 Entrance Stairs-Rails: Right Home Layout: One level     Bathroom Shower/Tub: Chief Strategy Officer: Standard     Home Equipment: Shower seat;Bedside commode;Cane - single point      Lives With: Alone    Prior Functioning/Environment Level of Independence: Independent        Comments: lives alone performs ADLs, housework, cooking and puzzles, doesn't drive   Frequency  Min 3X/week        Progress Toward Goals  OT Goals(current goals can now be found in the care plan section)  Progress towards OT goals: Progressing toward goals  Acute Rehab OT Goals Patient Stated Goal: do puzzles, bake  Plan Discharge plan remains appropriate    Co-evaluation    PT/OT/SLP Co-Evaluation/Treatment: Yes Reason for  Co-Treatment: Complexity of the patient's impairments (multi-system involvement);For patient/therapist safety;To address functional/ADL transfers PT goals addressed during session: Mobility/safety with mobility OT goals addressed during session: ADL's and self-care;Strengthening/ROM      AM-PAC OT "6 Clicks" Daily Activity     Outcome Measure   Help from another person eating meals?: Total(NPO) Help from another person taking care of personal grooming?: A Lot Help from another person toileting, which includes using toliet, bedpan, or urinal?: Total Help from another person bathing (including washing, rinsing, drying)?: A Lot Help from another person to put on and taking off regular upper body clothing?: Total Help from another person to put on and taking off regular lower body clothing?: Total 6 Click Score: 8    End of Session Equipment Utilized During Treatment: Gait belt  OT Visit Diagnosis: Unsteadiness on feet (R26.81);Muscle weakness (generalized) (M62.81);Hemiplegia and hemiparesis Hemiplegia - Right/Left: Left Hemiplegia - dominant/non-dominant: Non-Dominant Hemiplegia - caused by: Cerebral infarction   Activity Tolerance Patient tolerated treatment well   Patient Left in chair;with call bell/phone within reach;with chair alarm set   Nurse Communication Mobility status        Time: 1610-9604 OT Time Calculation (min): 34 min  Charges: OT General Charges $OT Visit: 1 Visit OT Treatments $Self Care/Home Management : 8-22 mins  Ignacia Palma, OTR/L Acute [employer] Pager [phone] Office  [phone]      Evette Georges 04/06/2018, 12:13 PM

## 2018-04-06 NOTE — H&P (Addendum)
Physical Medicine and Rehabilitation Admission H&P    Chief Complaint  Patient presents with   Code Stroke   Chief complaint: left side weakness  HPI: Jessica Meyer is an 83 year old right handed female history of hypertension, hyperlipidemia, diabetes mellitus, bilateral total knee replacement. Per chart review and family, patient lives alone. Independent prior to admission. One level home with 3 steps to entry. She performed her own ADLs as well as housework and cooking. She does not drive. Presented 04/04/2018 with fall and left-sided weakness. No loss of consciousness. CT of the head showed asymmetric hyperattenuation right MCA in the sylvian fissure. CT angiogram of head and neck showed occluded right ICA with trace reconstitution. Critical stenosis left ICA origin. Severe stenosis of bilateral vertebral artery origins, patent vertebral arteries. CT perfusion scan showed a large area right MCA penumbra with 9 mL right basal ganglia and possible temporal lobe cortical infarction. MRI brain reviewed, showing bilateral infarcts, >moderate to large right MCA infarct.  Per report, right MCA infarction with additional punctate acute infarctions cerebellum bilaterally. Attempts at mechanical thrombectomy unsuccessful. Echocardiogram with ejection fraction of 65 % hyperdynamic systolic function.No source of embolus. Neurology follow-up maintained on aspirin 81 mg daily and Plavix 3 months and Plavix alone.  Hospital complicated by dysphagia #1 nectar thick liquid. Therapy evaluation completed with recommendations of physical medicine rehabilitation consult. Patient was admitted for a comprehensive rehabilitation program.  Please see preadmission assessment from today as well.  Review of Systems  Constitutional: Negative for chills and fever.  HENT: Negative for hearing loss.   Eyes: Negative for blurred vision and double vision.  Respiratory: Negative for cough and shortness of breath.     Cardiovascular: Negative for chest pain, palpitations and leg swelling.  Gastrointestinal: Positive for constipation. Negative for nausea and vomiting.  Genitourinary: Negative for dysuria, flank pain and hematuria.  Musculoskeletal: Positive for joint pain and myalgias.       Noted fall leading to admission with left-sided weakness.  Skin: Negative for rash.  Neurological: Positive for sensory change, speech change, focal weakness and weakness.  Psychiatric/Behavioral: Positive for depression. The patient has insomnia.   All other systems reviewed and are negative.  Past Medical History:  Diagnosis Date   Arthritis    Diabetes mellitus without complication ([place])    History of chicken pox    Hyperlipidemia    Hypertension    Thyroid disease    hypothyroid   Past Surgical History:  Procedure Laterality Date   ABDOMINAL HYSTERECTOMY     HIP ARTHROPLASTY Right 01/26/2016   Procedure: ARTHROPLASTY BIPOLAR HIP (HEMIARTHROPLASTY);  Surgeon: Renette Butters, MD;  Location: [facility];  Service: Orthopedics;  Laterality: Right;   KNEE ARTHROSCOPY Bilateral    RADIOLOGY WITH ANESTHESIA N/A 04/04/2018   Procedure: RADIOLOGY WITH ANESTHESIA;  Surgeon: Radiologist, Medication, MD;  Location: [facility];  Service: Radiology;  Laterality: N/A;   REPLACEMENT TOTAL KNEE BILATERAL     1998 left , 1992 right   Family History  Problem Relation Age of Onset   Arthritis Mother    Depression Mother    Heart attack Father        24    Hypertension Father    Social History:  reports that she has never smoked. Her smokeless tobacco use includes snuff. She reports current alcohol use of about 1.0 standard drinks of alcohol per week. She reports that she does not use drugs. Allergies: No Known Allergies Medications Prior to Admission  Medication  Sig Dispense Refill   ACCU-CHEK SOFTCLIX LANCETS lancets Use as instructed to check blood sugar twice a day.  DX E11.9 200 each 1   amLODipine  (NORVASC) 5 MG tablet TAKE 1 TABLET BY MOUTH DAILY (Patient taking differently: Take 5 mg by mouth daily. ) 90 tablet 1   aspirin EC 81 MG tablet Take 81 mg by mouth daily.     atorvastatin (LIPITOR) 10 MG tablet Take 1 tablet (10 mg total) by mouth at bedtime. 90 tablet 3   B-D UF III MINI PEN NEEDLES 31G X 5 MM MISC USE DAILY AS DIRECTED 100 each 1   Blood Glucose Monitoring Suppl (ACCU-CHEK AVIVA PLUS) w/Device KIT USE AS DIRECTED 1 kit 0   buPROPion (WELLBUTRIN XL) 300 MG 24 hr tablet TAKE 1 TABLET BY MOUTH DAILY (Patient taking differently: Take 300 mg by mouth daily. ) 90 tablet 1   carvedilol (COREG) 12.5 MG tablet TAKE 1 TABLET BY MOUTH TWO TIMES A DAY WITH MEAL (Patient taking differently: Take 12.5 mg by mouth 2 (two) times daily with a meal. ) 180 tablet 3   escitalopram (LEXAPRO) 5 MG tablet TAKE ONE TABLET BY MOUTH DAILY (Patient taking differently: Take 5 mg by mouth at bedtime. ) 30 tablet 5   glipiZIDE (GLUCOTROL XL) 2.5 MG 24 hr tablet Take 1 tablet (2.5 mg total) by mouth daily before breakfast. 90 tablet 3   glucose blood (ACCU-CHEK AVIVA PLUS) test strip USE TO TEST BLOOD SUGAR TWO TIMES A DAY  Dx. E11.22, N18.3, Z79.4 200 each 1   hydrOXYzine (ATARAX/VISTARIL) 25 MG tablet TAKE ONE TABLET BY MOUTH TWO TIMES A DAY AS NEEDED (Patient taking differently: Take 50 mg by mouth at bedtime. ) 180 tablet 0   Insulin Glargine (LANTUS) 100 UNIT/ML Solostar Pen USE 17 UNTIS  UNDER THE SKIN BEFORE LUNCH AS DIRECTED (Patient taking differently: Inject 17 Units into the skin daily. ) 15 pen 2   Lancets Misc. (ACCU-CHEK SOFTCLIX LANCET DEV) KIT Use to check blood sugar twice a day.  DX E11.22, N18.3, Z79.4 1 kit 0   levothyroxine (SYNTHROID, LEVOTHROID) 125 MCG tablet TAKE ONE HALF TABLET BY MOUTH EVERY MORNING BEFORE BREAKFAST (Patient taking differently: Take 62.5 mcg by mouth daily before breakfast. ) 45 tablet 0   lisinopril (PRINIVIL,ZESTRIL) 40 MG tablet TAKE ONE TABLET BY  MOUTH DAILY (Patient taking differently: Take 40 mg by mouth daily. ) 90 tablet 0   Zoster Vaccine Adjuvanted [facility]) injection Inject 0.17m IM now and again in 2-6 months. (Patient not taking: Reported on 04/05/2018) 0.5 mL 1    Drug Regimen Review Drug regimen was reviewed and remains appropriate with no significant issues identified  Home: Home Living Family/patient expects to be discharged to:: Private residence Living Arrangements: Alone Available Help at Discharge: Family Type of Home: House Home Access: Stairs to enter ETechnical brewerof Steps: 3 Entrance Stairs-Rails: Right Home Layout: One level Bathroom Shower/Tub: TChiropodist Standard Home Equipment: SCivil engineer, contracting Bedside commode, CRadio producer- single point  Lives With: Alone   Functional History: Prior Function Level of Independence: Independent Comments: lives alone performs ADLs, housework, cooking and puzzles, doesn't drive  Functional Status:  Mobility: Bed Mobility Overal bed mobility: Needs Assistance Bed Mobility: Supine to Sit Supine to sit: Max assist General bed mobility comments: physical assist to bring LLE to EOB going towards left side and assist to elevate trunk. hand over hand guidance for utilization of rail on left side Transfers Overall transfer  level: Needs assistance Equipment used: 2 person hand held assist Transfers: Sit to/from Stand, Stand Pivot Transfers Sit to Stand: Max assist, +2 safety/equipment, +2 physical assistance Stand pivot transfers: Max assist, +2 safety/equipment, +2 physical assistance General transfer comment: max assist +2 to stand from bed with bil UE supported and LLE blocked. Hyperextension of L knee noted; manual facilitation provided for increased L ankle dorsiflexion. Physical assist to pivot to chair and scoot back on surface.  Ambulation/Gait General Gait Details: unable    ADL: ADL Overall ADL's : Needs  assistance/impaired Eating/Feeding: NPO Eating/Feeding Details (indicate cue type and reason): pt perseverating on wanting water this session, SLP following up today for swallow eval Grooming: Brushing hair, Minimal assistance, Sitting Grooming Details (indicate cue type and reason): required max questioning cues to notice she had comb in her hand, once redirected to task pt able to perform while in supported sitting in recliner Upper Body Bathing: Moderate assistance, Sitting Lower Body Bathing: Maximal assistance, +2 for physical assistance, +2 for safety/equipment, Sitting/lateral leans, Sit to/from stand Upper Body Dressing : Moderate assistance, Sitting Lower Body Dressing: +2 for physical assistance, +2 for safety/equipment, Maximal assistance, Sit to/from stand Toilet Transfer: Maximal assistance, +2 for physical assistance, +2 for safety/equipment, Stand-pivot Toilet Transfer Details (indicate cue type and reason): with total A for weight shifting for pt to step with RLE Toileting- Clothing Manipulation and Hygiene: Maximal assistance, +2 for physical assistance, +2 for safety/equipment, Sit to/from stand Functional mobility during ADLs: Maximal assistance, +2 for physical assistance, +2 for safety/equipment(stand pivot transfer ) General ADL Comments: pt with significant L lateral lean, poor sitting and standing balance, L inattention, impaired cognition  Cognition: Cognition Overall Cognitive Status: Impaired/Different from baseline Arousal/Alertness: Awake/alert Orientation Level: Oriented X4 Attention: Sustained Sustained Attention: Impaired Sustained Attention Impairment: Verbal basic Memory: Impaired Memory Impairment: Retrieval deficit Awareness: Impaired Awareness Impairment: Emergent impairment Problem Solving: Impaired Problem Solving Impairment: Verbal basic, Functional basic Executive Function: Reasoning Reasoning: Impaired Behaviors: Impulsive Safety/Judgment:  Impaired Cognition Arousal/Alertness: Awake/alert Behavior During Therapy: WFL for tasks assessed/performed Overall Cognitive Status: Impaired/Different from baseline Area of Impairment: Memory, Problem solving, Following commands, Safety/judgement, Awareness, Attention Current Attention Level: Sustained Memory: Decreased short-term memory Following Commands: Follows one step commands inconsistently, Follows one step commands with increased time Safety/Judgement: Decreased awareness of safety, Decreased awareness of deficits Awareness: Intellectual Problem Solving: Slow processing, Decreased initiation, Difficulty sequencing, Requires verbal cues General Comments: Improved ability to follow one step commands this session with increased time and redirection to task. Hyperfocused on "I can't move my left side," when asked to perform a task  Physical Exam: Blood pressure 137/65, pulse 69, temperature 98.2 F (36.8 C), temperature source Oral, resp. rate 17, height 5' 6"  (1.676 m), weight 63.1 kg, SpO2 98 %. Physical Exam  Constitutional: She appears well-developed and well-nourished.  HENT:  Head: Normocephalic and atraumatic.  Eyes: EOM are normal. Right eye exhibits no discharge. Left eye exhibits no discharge.  Neck: Normal range of motion.  Cardiovascular: Normal rate and regular rhythm.  Respiratory: Effort normal and breath sounds normal.  GI: Soft. Bowel sounds are normal.  Musculoskeletal:     Comments: No edema or tenderness in extremities  Neurological: She is alert.  Left-sided neglect  Follows commands.  Fair insight to deficits. Patient weakness Dysarthria Motor: LUE/LLE: 0/5 proximal to distal RUE/RLE: 5/5 proximal to distal Sensation absent to light touch LUE/LLE  Skin: Skin is warm and dry.  Psychiatric: Her affect is blunt. Her speech is delayed.  Results for orders placed or performed during the hospital encounter of 04/04/18 (from the past 48 hour(s))   Ethanol     Status: None   Collection Time: 04/04/18  9:30 PM  Result Value Ref Range   Alcohol, Ethyl (B) <10 <10 mg/dL    Comment: (NOTE) Lowest detectable limit for serum alcohol is 10 mg/dL. For medical purposes only. Performed at [facility], [place] [address] [place], [place] [address]   Protime-INR     Status: None   Collection Time: 04/04/18  9:30 PM  Result Value Ref Range   Prothrombin Time 13.6 11.4 - 15.2 seconds   INR 1.1 0.8 - 1.2    Comment: (NOTE) INR goal varies based on device and disease states. Performed at [facility], [place] [address], [place], [place] [address]   APTT     Status: None   Collection Time: 04/04/18  9:30 PM  Result Value Ref Range   aPTT 28 24 - 36 seconds    Comment: Performed at [facility] [address] [place], [place] [address]  CBC     Status: Abnormal   Collection Time: 04/04/18  9:30 PM  Result Value Ref Range   WBC 13.4 (H) 4.0 - 10.5 K/uL   RBC 4.24 3.87 - 5.11 MIL/uL   Hemoglobin 12.8 12.0 - 15.0 g/dL   HCT 40.2 36.0 - 46.0 %   MCV 94.8 80.0 - 100.0 fL   MCH 30.2 26.0 - 34.0 pg   MCHC 31.8 30.0 - 36.0 g/dL   RDW 12.7 11.5 - 15.5 %   Platelets 240 150 - 400 K/uL   nRBC 0.0 0.0 - 0.2 %    Comment: Performed at [facility], [place] [address] [place], [place] [address]  Differential     Status: Abnormal   Collection Time: 04/04/18  9:30 PM  Result Value Ref Range   Neutrophils Relative % 86 %   Neutro Abs 11.4 (H) 1.7 - 7.7 K/uL   Lymphocytes Relative 8 %   Lymphs Abs 1.1 0.7 - 4.0 K/uL   Monocytes Relative 6 %   Monocytes Absolute 0.8 0.1 - 1.0 K/uL   Eosinophils Relative 0 %   Eosinophils Absolute 0.0 0.0 - 0.5 K/uL   Basophils Relative 0 %   Basophils Absolute 0.1 0.0 - 0.1 K/uL   Immature Granulocytes 0 %   Abs Immature Granulocytes 0.05 0.00 - 0.07 K/uL    Comment: Performed at [facility] [address], [place], [place] [address]  Comprehensive metabolic panel     Status:  Abnormal   Collection Time: 04/04/18  9:30 PM  Result Value Ref Range   Sodium 135 135 - 145 mmol/L   Potassium 4.1 3.5 - 5.1 mmol/L   Chloride 102 98 - 111 mmol/L   CO2 20 (L) 22 - 32 mmol/L   Glucose, Bld 247 (H) 70 - 99 mg/dL   BUN 17 8 - 23 mg/dL   Creatinine, Ser 1.13 (H) 0.44 - 1.00 mg/dL   Calcium 9.9 8.9 - 10.3 mg/dL   Total Protein 7.6 6.5 - 8.1 g/dL   Albumin 4.3 3.5 - 5.0 g/dL   AST 27 15 - 41 U/L   ALT 18 0 - 44 U/L   Alkaline Phosphatase 102 38 - 126 U/L   Total Bilirubin 1.0 0.3 - 1.2 mg/dL   GFR calc non Af Amer 46 (L) >60 mL/min   GFR calc Af  Amer 53 (L) >60 mL/min   Anion gap 13 5 - 15    Comment: Performed at [facility], [place] [address] [place], [place] [address]  CBG monitoring, ED     Status: Abnormal   Collection Time: 04/04/18  9:32 PM  Result Value Ref Range   Glucose-Capillary 214 (H) 70 - 99 mg/dL   Comment 1 Notify RN    Comment 2 Document in Chart   I-stat Creatinine, ED     Status: None   Collection Time: 04/04/18  9:39 PM  Result Value Ref Range   Creatinine, Ser 1.00 0.44 - 1.00 mg/dL  Glucose, capillary     Status: Abnormal   Collection Time: 04/05/18  1:33 AM  Result Value Ref Range   Glucose-Capillary 233 (H) 70 - 99 mg/dL  MRSA PCR Screening     Status: None   Collection Time: 04/05/18  2:48 AM  Result Value Ref Range   MRSA by PCR NEGATIVE NEGATIVE    Comment:        The GeneXpert MRSA Assay (FDA approved for NASAL specimens only), is one component of a comprehensive MRSA colonization surveillance program. It is not intended to diagnose MRSA infection nor to guide or monitor treatment for MRSA infections. Performed at [facility], [place] [address], [place], [place] [address]   Hemoglobin A1c     Status: Abnormal   Collection Time: 04/05/18  4:12 AM  Result Value Ref Range   Hgb A1c MFr Bld 8.0 (H) 4.8 - 5.6 %    Comment: (NOTE) Pre diabetes:          5.7%-6.4% Diabetes:              >6.4% Glycemic control for    <7.0% adults with diabetes    Mean Plasma Glucose 182.9 mg/dL    Comment: Performed at [facility] [address], [place], [place] [address]  Lipid panel     Status: None   Collection Time: 04/05/18  4:12 AM  Result Value Ref Range   Cholesterol 131 0 - 200 mg/dL   Triglycerides 130 <150 mg/dL   HDL 42 >40 mg/dL   Total CHOL/HDL Ratio 3.1 RATIO   VLDL 26 0 - 40 mg/dL   LDL Cholesterol 63 0 - 99 mg/dL    Comment:        Total Cholesterol/HDL:CHD Risk Coronary Heart Disease Risk Table                     Men   Women  1/2 Average Risk   3.4   3.3  Average Risk       5.0   4.4  2 X Average Risk   9.6   7.1  3 X Average Risk  23.4   11.0        Use the calculated Patient Ratio above and the CHD Risk Table to determine the patient's CHD Risk.        ATP III CLASSIFICATION (LDL):  <100     mg/dL   Optimal  100-129  mg/dL   Near or Above                    Optimal  130-159  mg/dL   Borderline  160-189  mg/dL   High  >190     mg/dL   Very High Performed at [facility] [address], [place], [place] [address]  CBC with Differential/Platelet     Status: Abnormal   Collection Time: 04/05/18  4:12 AM  Result Value Ref Range   WBC 12.9 (H) 4.0 - 10.5 K/uL   RBC 3.64 (L) 3.87 - 5.11 MIL/uL   Hemoglobin 11.0 (L) 12.0 - 15.0 g/dL   HCT 33.9 (L) 36.0 - 46.0 %   MCV 93.1 80.0 - 100.0 fL   MCH 30.2 26.0 - 34.0 pg   MCHC 32.4 30.0 - 36.0 g/dL   RDW 12.7 11.5 - 15.5 %   Platelets 209 150 - 400 K/uL   nRBC 0.0 0.0 - 0.2 %   Neutrophils Relative % 87 %   Neutro Abs 11.2 (H) 1.7 - 7.7 K/uL   Lymphocytes Relative 7 %   Lymphs Abs 0.9 0.7 - 4.0 K/uL   Monocytes Relative 6 %   Monocytes Absolute 0.7 0.1 - 1.0 K/uL   Eosinophils Relative 0 %   Eosinophils Absolute 0.0 0.0 - 0.5 K/uL   Basophils Relative 0 %   Basophils Absolute 0.0 0.0 - 0.1 K/uL   Immature Granulocytes 0 %   Abs Immature Granulocytes 0.05 0.00 - 0.07 K/uL    Comment: Performed at [facility], [address] [place], [place] [address]  Basic metabolic panel     Status: Abnormal   Collection Time: 04/05/18  4:12 AM  Result Value Ref Range   Sodium 135 135 - 145 mmol/L   Potassium 4.0 3.5 - 5.1 mmol/L   Chloride 106 98 - 111 mmol/L   CO2 19 (L) 22 - 32 mmol/L   Glucose, Bld 279 (H) 70 - 99 mg/dL   BUN 15 8 - 23 mg/dL   Creatinine, Ser 0.97 0.44 - 1.00 mg/dL   Calcium 8.8 (L) 8.9 - 10.3 mg/dL   GFR calc non Af Amer 55 (L) >60 mL/min   GFR calc Af Amer >60 >60 mL/min   Anion gap 10 5 - 15    Comment: Performed at [facility] [address], [place], [place] [address]  Glucose, capillary     Status: Abnormal   Collection Time: 04/05/18  8:03 AM  Result Value Ref Range   Glucose-Capillary 247 (H) 70 - 99 mg/dL   Comment 1 Notify RN    Comment 2 Document in Chart   Glucose, capillary     Status: Abnormal   Collection Time: 04/05/18 11:49 AM  Result Value Ref Range   Glucose-Capillary 212 (H) 70 - 99 mg/dL   Comment 1 Notify RN    Comment 2 Document in Chart   Glucose, capillary     Status: Abnormal   Collection Time: 04/05/18  3:23 PM  Result Value Ref Range   Glucose-Capillary 167 (H) 70 - 99 mg/dL   Comment 1 Notify RN    Comment 2 Document in Chart   Glucose, capillary     Status: Abnormal   Collection Time: 04/05/18  7:35 PM  Result Value Ref Range   Glucose-Capillary 190 (H) 70 - 99 mg/dL  Glucose, capillary     Status: Abnormal   Collection Time: 04/06/18  8:13 AM  Result Value Ref Range   Glucose-Capillary 244 (H) 70 - 99 mg/dL  Glucose, capillary     Status: Abnormal   Collection Time: 04/06/18 12:12 PM  Result Value Ref Range   Glucose-Capillary 181 (H) 70 - 99 mg/dL   Ct Angio Head W Or Wo Contrast  Result Date: 04/04/2018 CLINICAL DATA:  History  of hypertension and hyperlipidemia. LEFT facial droop and LEFT extremity weakness. EXAM: CT ANGIOGRAPHY HEAD AND NECK CT PERFUSION BRAIN TECHNIQUE: Multidetector CT imaging of the head and neck  was performed using the standard protocol during bolus administration of intravenous contrast. Multiplanar CT image reconstructions and MIPs were obtained to evaluate the vascular anatomy. Carotid stenosis measurements (when applicable) are obtained utilizing NASCET criteria, using the distal internal carotid diameter as the denominator. Multiphase CT imaging of the brain was performed following IV bolus contrast injection. Subsequent parametric perfusion maps were calculated using RAPID software. CONTRAST:  128m OMNIPAQUE IOHEXOL 350 MG/ML SOLN COMPARISON:  CT HEAD April 04, 2018 at 2137 hours. FINDINGS: CTA NECK FINDINGS: AORTIC ARCH: Normal appearance of the thoracic arch, normal branch pattern. Mild calcific atherosclerosis aortic arch. The origins of the innominate, left Common carotid artery and subclavian artery are patent. Mild RIGHT and moderate LEFT subclavian artery stenosis due to atherosclerosis. RIGHT CAROTID SYSTEM: Common carotid artery is patent. Calcific atherosclerosis, occluded RIGHT internal carotid artery with thready faint recanalization. LEFT CAROTID SYSTEM: Common carotid artery is patent. Calcific atherosclerosis with short segment critical stenosis LEFT ICA origin. VERTEBRAL ARTERIES:Left vertebral artery is dominant. Severe stenosis bilateral vertebral artery origins. Patent vertebral arteries. SKELETON: No acute osseous process though bone windows have not been submitted. OTHER NECK: Soft tissues of the neck are nonacute though, not tailored for evaluation. C2-3 canal stenosis due to disc osteophyte complex. UPPER CHEST: Included lung apices are clear. No superior mediastinal lymphadenopathy. CTA HEAD FINDINGS: ANTERIOR CIRCULATION: RIGHT petrous 2 cavernous ICA occluded with minimal reconstitution at carotid siphon. LEFT internal carotid artery is patent with moderate stenosis supraclinoid segment. Patent bilateral carotid termini. Emergent RIGHT M1 occlusion with intermediate  collateralization by single-phase CTA. Patent anterior cerebral arteries and LEFT MCA. Moderate tandem stenosis LEFT ACA. No  contrast extravasation or aneurysm. POSTERIOR CIRCULATION: Patent vertebral arteries, vertebrobasilar junction and basilar artery, as well as main branch vessels. Severe stenosis RIGHT V4 segment. Patent posterior cerebral arteries, mild tandem stenoses compatible with atherosclerosis. No large vessel occlusion, flow-limiting stenosis, contrast extravasation or aneurysm. VENOUS SINUSES: Major dural venous sinuses are patent though not tailored for evaluation on this angiographic examination. ANATOMIC VARIANTS: None. DELAYED PHASE: Not performed. MIP images reviewed. CT Brain Perfusion Findings: Moderate motion degraded examination. CBF (<30%) Volume: 115mPerfusion (Tmax>6.0s) volume: 8667mismatch Volume: 68m76mfarction Location:RIGHT frontotemporal parietal penumbra with RIGHT basal ganglia, RIGHT periventricular and RIGHT temporal core infarcts. IMPRESSION: CTA NECK: 1. Occluded RIGHT ICA with trace reconstitution. 2. Critical stenosis LEFT ICA origin. 3. Severe stenosis bilateral vertebral artery origins, patent vertebral arteries. CTA HEAD: 1. Occluded RIGHT ICA with reconstitution at carotid terminus. Emergent RIGHT M1 occlusion with intermediate collaterals by single-phase CTA. CT PERFUSION: 1. Motion degraded examination. Large area RIGHT MCA penumbra with 9 cc RIGHT basal ganglia and possible temporal lobe core infarcts. 2. Moderate stenoses LEFT ACA. Critical Value/emergent results text p[facility] AM[facility] secure system on 04/04/2018 at 10:08 pm, including interpreting physician's phone number. Acute findings discussed with and reconfirmed by Dr.[facility]4/01/2018 at 10:20 pm. Aortic Atherosclerosis (ICD10-I70.0). Electronically Signed   By: CourElon Alas.   On: 04/04/2018 22:21   Ct Angio Neck W Or Wo Contrast  Result Date:  04/04/2018 CLINICAL DATA:  History of hypertension and hyperlipidemia. LEFT facial droop and LEFT extremity weakness. EXAM: CT ANGIOGRAPHY HEAD AND NECK CT PERFUSION BRAIN TECHNIQUE: Multidetector CT imaging of the head and neck was performed using the standard  protocol during bolus administration of intravenous contrast. Multiplanar CT image reconstructions and MIPs were obtained to evaluate the vascular anatomy. Carotid stenosis measurements (when applicable) are obtained utilizing NASCET criteria, using the distal internal carotid diameter as the denominator. Multiphase CT imaging of the brain was performed following IV bolus contrast injection. Subsequent parametric perfusion maps were calculated using RAPID software. CONTRAST:  140m OMNIPAQUE IOHEXOL 350 MG/ML SOLN COMPARISON:  CT HEAD April 04, 2018 at 2137 hours. FINDINGS: CTA NECK FINDINGS: AORTIC ARCH: Normal appearance of the thoracic arch, normal branch pattern. Mild calcific atherosclerosis aortic arch. The origins of the innominate, left Common carotid artery and subclavian artery are patent. Mild RIGHT and moderate LEFT subclavian artery stenosis due to atherosclerosis. RIGHT CAROTID SYSTEM: Common carotid artery is patent. Calcific atherosclerosis, occluded RIGHT internal carotid artery with thready faint recanalization. LEFT CAROTID SYSTEM: Common carotid artery is patent. Calcific atherosclerosis with short segment critical stenosis LEFT ICA origin. VERTEBRAL ARTERIES:Left vertebral artery is dominant. Severe stenosis bilateral vertebral artery origins. Patent vertebral arteries. SKELETON: No acute osseous process though bone windows have not been submitted. OTHER NECK: Soft tissues of the neck are nonacute though, not tailored for evaluation. C2-3 canal stenosis due to disc osteophyte complex. UPPER CHEST: Included lung apices are clear. No superior mediastinal lymphadenopathy. CTA HEAD FINDINGS: ANTERIOR CIRCULATION: RIGHT petrous 2 cavernous ICA  occluded with minimal reconstitution at carotid siphon. LEFT internal carotid artery is patent with moderate stenosis supraclinoid segment. Patent bilateral carotid termini. Emergent RIGHT M1 occlusion with intermediate collateralization by single-phase CTA. Patent anterior cerebral arteries and LEFT MCA. Moderate tandem stenosis LEFT ACA. No  contrast extravasation or aneurysm. POSTERIOR CIRCULATION: Patent vertebral arteries, vertebrobasilar junction and basilar artery, as well as main branch vessels. Severe stenosis RIGHT V4 segment. Patent posterior cerebral arteries, mild tandem stenoses compatible with atherosclerosis. No large vessel occlusion, flow-limiting stenosis, contrast extravasation or aneurysm. VENOUS SINUSES: Major dural venous sinuses are patent though not tailored for evaluation on this angiographic examination. ANATOMIC VARIANTS: None. DELAYED PHASE: Not performed. MIP images reviewed. CT Brain Perfusion Findings: Moderate motion degraded examination. CBF (<30%) Volume: 151mPerfusion (Tmax>6.0s) volume: 8644mismatch Volume: 30m69mfarction Location:RIGHT frontotemporal parietal penumbra with RIGHT basal ganglia, RIGHT periventricular and RIGHT temporal core infarcts. IMPRESSION: CTA NECK: 1. Occluded RIGHT ICA with trace reconstitution. 2. Critical stenosis LEFT ICA origin. 3. Severe stenosis bilateral vertebral artery origins, patent vertebral arteries. CTA HEAD: 1. Occluded RIGHT ICA with reconstitution at carotid terminus. Emergent RIGHT M1 occlusion with intermediate collaterals by single-phase CTA. CT PERFUSION: 1. Motion degraded examination. Large area RIGHT MCA penumbra with 9 cc RIGHT basal ganglia and possible temporal lobe core infarcts. 2. Moderate stenoses LEFT ACA. Critical Value/emergent results text paged to [facility] AM[facility] secure system on 04/04/2018 at 10:08 pm, including interpreting physician's phone number. Acute findings discussed with and reconfirmed by  [facility]01/2018 at 10:20 pm. Aortic Atherosclerosis (ICD10-I70.0). Electronically Signed   By: CourElon Alas.   On: 04/04/2018 22:21   Mr Brain Wo Contrast  Result Date: 04/05/2018 CLINICAL DATA:  Left-sided weakness. Right M1 occlusion with unsuccessful attempted revascularization due to proximal right ICA occlusion. EXAM: MRI HEAD WITHOUT CONTRAST TECHNIQUE: Multiplanar, multiecho pulse sequences of the brain and surrounding structures were obtained without intravenous contrast. COMPARISON:  Head CT, CTA, and CTP for 120 FINDINGS: Some sequences are moderately to severely motion degraded. Brain: There is a moderate-sized acute right MCA territory infarct involving the temporal lobe, insula, basal ganglia, and frontoparietal  operculum. Additional small foci of predominantly cortical acute infarction are present more superiorly in the right frontal and parietal lobes. There are also multiple small acute infarcts involving left frontal and parietal cortex, left centrum semiovale, and superior left occipital lobe. No associated hemorrhage is evident within limitations of motion artifact. Multiple small chronic infarcts involving right frontoparietal cortex and white matter in a parasagittal distribution likely reflect remote watershed ischemia. Patchy T2 hyperintensities elsewhere in the cerebral white matter bilaterally are nonspecific but compatible with moderate chronic small vessel ischemic disease. There is moderate central predominant cerebral atrophy with asymmetric dilatation of the left lateral ventricle. Small chronic infarcts are present in the cerebellum bilaterally. No mass/mass effect or extra-axial fluid collection is identified. Vascular: Abnormal appearance of the proximal intracranial right ICA related to known proximal occlusion. Skull and upper cervical spine: Grossly unremarkable bone marrow signal. Sinuses/Orbits: Unremarkable orbits. Small left mastoid effusion.  Clear paranasal sinuses. Other: None. IMPRESSION: 1. Moderate-sized acute right MCA infarct. 2. Additional punctate acute infarcts in the cerebrum bilaterally. 3. Moderate chronic small vessel ischemic disease with chronic lacunar infarcts as above. Electronically Signed   By: Logan Bores M.D.   On: 04/05/2018 14:34   Ct C-spine No Charge  Result Date: 04/04/2018 CLINICAL DATA:  Neck pain. EXAM: CT CERVICAL SPINE WITHOUT CONTRAST TECHNIQUE: Multidetector reformatted imaging of the cervical spine was performed from CTA NECK. COMPARISON:  None. FINDINGS: ALIGNMENT: Straightened lordosis.  No malalignment. SKULL BASE AND VERTEBRAE: Cervical vertebral bodies and posterior elements are intact. Moderate C2-3, C5-6 disc height loss, mild at C3-4 with endplate spurring compatible with degenerative discs. C1-2 articulation maintained with mild arthropathy. Osteopenia without destructive bony lesions. Small RIGHT C7 rib. SOFT TISSUES AND SPINAL CANAL: Please see dedicated CTA from same day, reported separately for vascular findings. DISC LEVELS: Severe canal stenosis C2-3, mild at C3-4. Moderate RIGHT C2-3, RIGHT C3-4 neural foraminal narrowing. UPPER CHEST: Lung apices are clear. OTHER: None. IMPRESSION: 1. No fracture or malalignment. 2. Severe canal stenosis C2-3. Moderate C2-3 and C3-4 neural foraminal narrowing. Electronically Signed   By: Elon Alas M.D.   On: 04/04/2018 23:32   Ct Cerebral Perfusion W Contrast  Result Date: 04/04/2018 CLINICAL DATA:  History of hypertension and hyperlipidemia. LEFT facial droop and LEFT extremity weakness. EXAM: CT ANGIOGRAPHY HEAD AND NECK CT PERFUSION BRAIN TECHNIQUE: Multidetector CT imaging of the head and neck was performed using the standard protocol during bolus administration of intravenous contrast. Multiplanar CT image reconstructions and MIPs were obtained to evaluate the vascular anatomy. Carotid stenosis measurements (when applicable) are obtained utilizing  NASCET criteria, using the distal internal carotid diameter as the denominator. Multiphase CT imaging of the brain was performed following IV bolus contrast injection. Subsequent parametric perfusion maps were calculated using RAPID software. CONTRAST:  144m OMNIPAQUE IOHEXOL 350 MG/ML SOLN COMPARISON:  CT HEAD April 04, 2018 at 2137 hours. FINDINGS: CTA NECK FINDINGS: AORTIC ARCH: Normal appearance of the thoracic arch, normal branch pattern. Mild calcific atherosclerosis aortic arch. The origins of the innominate, left Common carotid artery and subclavian artery are patent. Mild RIGHT and moderate LEFT subclavian artery stenosis due to atherosclerosis. RIGHT CAROTID SYSTEM: Common carotid artery is patent. Calcific atherosclerosis, occluded RIGHT internal carotid artery with thready faint recanalization. LEFT CAROTID SYSTEM: Common carotid artery is patent. Calcific atherosclerosis with short segment critical stenosis LEFT ICA origin. VERTEBRAL ARTERIES:Left vertebral artery is dominant. Severe stenosis bilateral vertebral artery origins. Patent vertebral arteries. SKELETON: No acute osseous process though bone windows  have not been submitted. OTHER NECK: Soft tissues of the neck are nonacute though, not tailored for evaluation. C2-3 canal stenosis due to disc osteophyte complex. UPPER CHEST: Included lung apices are clear. No superior mediastinal lymphadenopathy. CTA HEAD FINDINGS: ANTERIOR CIRCULATION: RIGHT petrous 2 cavernous ICA occluded with minimal reconstitution at carotid siphon. LEFT internal carotid artery is patent with moderate stenosis supraclinoid segment. Patent bilateral carotid termini. Emergent RIGHT M1 occlusion with intermediate collateralization by single-phase CTA. Patent anterior cerebral arteries and LEFT MCA. Moderate tandem stenosis LEFT ACA. No  contrast extravasation or aneurysm. POSTERIOR CIRCULATION: Patent vertebral arteries, vertebrobasilar junction and basilar artery, as well as  main branch vessels. Severe stenosis RIGHT V4 segment. Patent posterior cerebral arteries, mild tandem stenoses compatible with atherosclerosis. No large vessel occlusion, flow-limiting stenosis, contrast extravasation or aneurysm. VENOUS SINUSES: Major dural venous sinuses are patent though not tailored for evaluation on this angiographic examination. ANATOMIC VARIANTS: None. DELAYED PHASE: Not performed. MIP images reviewed. CT Brain Perfusion Findings: Moderate motion degraded examination. CBF (<30%) Volume: 70m Perfusion (Tmax>6.0s) volume: 858mMismatch Volume: 7364mnfarction Location:RIGHT frontotemporal parietal penumbra with RIGHT basal ganglia, RIGHT periventricular and RIGHT temporal core infarcts. IMPRESSION: CTA NECK: 1. Occluded RIGHT ICA with trace reconstitution. 2. Critical stenosis LEFT ICA origin. 3. Severe stenosis bilateral vertebral artery origins, patent vertebral arteries. CTA HEAD: 1. Occluded RIGHT ICA with reconstitution at carotid terminus. Emergent RIGHT M1 occlusion with intermediate collaterals by single-phase CTA. CT PERFUSION: 1. Motion degraded examination. Large area RIGHT MCA penumbra with 9 cc RIGHT basal ganglia and possible temporal lobe core infarcts. 2. Moderate stenoses LEFT ACA. Critical Value/emergent results text paged to Dr.[facility] AMION secure system on 04/04/2018 at 10:08 pm, including interpreting physician's phone number. Acute findings discussed with and reconfirmed by [facility] 04/04/2018 at 10:20 pm. Aortic Atherosclerosis (ICD10-I70.0). Electronically Signed   By: CouElon AlasD.   On: 04/04/2018 22:21   Ct Head Code Stroke Wo Contrast  Result Date: 04/04/2018 CLINICAL DATA:  Code stroke. Last seen normal 1530 hours. LEFT facial droop. LEFT extremity weakness. EXAM: CT HEAD WITHOUT CONTRAST TECHNIQUE: Contiguous axial images were obtained from the base of the skull through the vertex without intravenous contrast. COMPARISON:   None. FINDINGS: Brain: Mild to moderate atrophy, not unexpected for age. Ventriculomegaly, hydrocephalus ex vacuo. Hypoattenuation of white matter, both focal and confluent, likely chronic microvascular ischemic change, but no clear areas of cortical hypodensity. No hemorrhage, mass lesion, or extra-axial fluid. Vascular: Calcification of the cavernous internal carotid arteries and distal LEFT vertebral artery consistent with cerebrovascular atherosclerotic disease. Asymmetric hyperattenuation RIGHT MCA in the sylvian fissure, could reflect acute thrombus in one or both M2 MCA branches. Skull: Normal. Negative for fracture or focal lesion. Sinuses/Orbits: No acute finding. Other: None. ASPECTS ([facility] Early [facility] Score) - Ganglionic level infarction (caudate, lentiform nuclei, internal capsule, insula, M1-M3 cortex): 7 - Supraganglionic infarction (M4-M6 cortex): 3 Total score (0-10 with 10 being normal): 10 IMPRESSION: 1. Asymmetric hyperattenuation RIGHT MCA in the Sylvian fissure could reflect acute thrombus in one or both M2 branches. 2. Atrophy and small vessel disease. 3. ASPECTS is 10. These results were communicated to Dr. KirLeonel Ramsay 9:46 pmon 4/1/2020by text page via the [facility] Passaging system. * Electronically Signed   By: JohStaci RighterD.   On: 04/04/2018 21:49       Medical Problem List and Plan: 1.  Left side weakness with dysphagia secondary to right MCA infarction in the setting of  right ICA and right MCA occlusion status post unsuccessful attempts at thrombectomy.  Admit to CIR 2.  Antithrombotics: -DVT/anticoagulation:  SCDs  -antiplatelet therapy: aspirin 81 mg daily, Plavix 75 mg daily 3. Pain Management:  Tylenol 3 for headache as needed 4. Mood: Wellbutrin 300 mg daily,Lexapro 5 mg daily at bedtime  -antipsychotic agents: N/A 5. Neuropsych: This patient is not fully capable of making decisions on her own behalf. 6. Skin/Wound Care:  Routine skin checks 7.  Fluids/Electrolytes/Nutrition:  Routine in and out's with follow-up chemistries in the a.m. 8. Dysphagia. Dysphagia #1 nectar liquids. Follow-up speech therapy  Advance diet as tolerated 9. Hypertension. Norvasc 5 mg daily,lisinopril 40 mg daily, Coreg 12.5 mg twice a day. Monitor with increased mobility 10. Diabetes mellitus. Hemoglobin A1c 8.0. Glucotrol XL 2.5 mg daily, Lantus insulin 12 units daily at bedtime. Check blood sugars before meals and at bedtime.  Monitor with increased mobility 11. Hyperlipidemia. Lipitor 12. Hypothyroidism. Continue Synthroid 13.  Acute blood loss anemia:   Hemoglobin 9.3 on 4/4 follow-up CBC in a.m. 14.  CKD stage III:  Creatinine 1.11 on 4/4.  Follow-up BMP  Cathlyn Parsons, PA-C 04/06/2018

## 2018-04-06 NOTE — Progress Notes (Signed)
Physical Therapy Treatment Patient Details Name: Jessica Meyer MRN: [id number] DOB: 07/28/1935 Today's Date: 04/06/2018    History of Present Illness 83 yo admitted with left weakness and fall s/p unsuccessful IR attempt of Rt MCA CVA. PMhx: HTN, HLD, DM, arthritis, Rt hip hemi    PT Comments    Pt making steady progress towards her physical therapy goals. Session focused on sitting balance, manual facilitation with weightbearing to promote left sided awareness, and transfer to chair. Pt continues with left arm flaccidity and increased LLE extensor tone, but responsive to noxious stimuli. Requiring two person maximal assistance to stand pivot from bed to chair with left knee blocked. Continue to recommend comprehensive inpatient rehab (CIR) for post-acute therapy needs.    Follow Up Recommendations  CIR;Supervision/Assistance - 24 hour     Equipment Recommendations  Other (comment)(TBD with progression)    Recommendations for Other Services Rehab consult     Precautions / Restrictions Precautions Precautions: Fall Precaution Comments: left inattention and right gaze, increased extensor tone LLE Restrictions Weight Bearing Restrictions: No    Mobility  Bed Mobility Overal bed mobility: Needs Assistance Bed Mobility: Supine to Sit     Supine to sit: Max assist     General bed mobility comments: physical assist to bring LLE to EOB going towards left side and assist to elevate trunk. hand over hand guidance for utilization of rail on left side  Transfers Overall transfer level: Needs assistance Equipment used: 2 person hand held assist Transfers: Sit to/from Stand;Stand Pivot Transfers Sit to Stand: Max assist;+2 safety/equipment;+2 physical assistance Stand pivot transfers: Max assist;+2 safety/equipment;+2 physical assistance       General transfer comment: max assist +2 to stand from bed with bil UE supported and LLE blocked. Hyperextension of L knee noted; manual  facilitation provided for increased L ankle dorsiflexion. Physical assist to pivot to chair and scoot back on surface.   Ambulation/Gait                 Stairs             Wheelchair Mobility    Modified Rankin (Stroke Patients Only)       Balance Overall balance assessment: Needs assistance   Sitting balance-Leahy Scale: Poor Sitting balance - Comments: varying levels of assist from max assist to intermittent periods of min guard for static sitting balance. Pt exhibits decreased trunk/core control with initial heavy right lateral lean. Manual facilitation provided with visual cueing for achieving midline position; pt unable to maintain for significant period of time Postural control: Posterior lean;Left lateral lean Standing balance support: Bilateral upper extremity supported Standing balance-Leahy Scale: Zero                              Cognition Arousal/Alertness: Awake/alert Behavior During Therapy: WFL for tasks assessed/performed Overall Cognitive Status: Impaired/Different from baseline Area of Impairment: Memory;Problem solving;Following commands;Safety/judgement;Awareness;Attention                   Current Attention Level: Sustained Memory: Decreased short-term memory Following Commands: Follows one step commands inconsistently;Follows one step commands with increased time Safety/Judgement: Decreased awareness of safety;Decreased awareness of deficits Awareness: Intellectual Problem Solving: Slow processing;Decreased initiation;Difficulty sequencing;Requires verbal cues General Comments: Improved ability to follow one step commands this session with increased time and redirection to task. Hyperfocused on "I can't move my left side," when asked to perform a task  Exercises Other Exercises Other Exercises: Sitting balance: Bilateral rhythmic rotation towards right and left side. Left lateral leans onto elbow for weightbearing to  promote left sided awareness    General Comments        Pertinent Vitals/Pain Pain Assessment: Faces Faces Pain Scale: Hurts little more Pain Location: left foot with weightbearing Pain Descriptors / Indicators: Burning Pain Intervention(s): Monitored during session;Premedicated before session    Home Living Family/patient expects to be discharged to:: Private residence Living Arrangements: Alone Available Help at Discharge: Family Type of Home: House Home Access: Stairs to enter Entrance Stairs-Rails: Right Home Layout: One level Home Equipment: Shower seat;Bedside commode;Cane - single point      Prior Function Level of Independence: Independent      Comments: lives alone performs ADLs, housework, cooking and puzzles, doesn't drive   PT Goals (current goals can now be found in the care plan section) Acute Rehab PT Goals Patient Stated Goal: do puzzles, bake PT Goal Formulation: With patient Time For Goal Achievement: 04/19/18 Potential to Achieve Goals: Fair Progress towards PT goals: Progressing toward goals    Frequency    Min 4X/week      PT Plan Current plan remains appropriate    Co-evaluation PT/OT/SLP Co-Evaluation/Treatment: Yes Reason for Co-Treatment: Complexity of the patient's impairments (multi-system involvement);For patient/therapist safety;To address functional/ADL transfers PT goals addressed during session: Mobility/safety with mobility;Balance        AM-PAC PT "6 Clicks" Mobility   Outcome Measure  Help needed turning from your back to your side while in a flat bed without using bedrails?: A Lot Help needed moving from lying on your back to sitting on the side of a flat bed without using bedrails?: Total Help needed moving to and from a bed to a chair (including a wheelchair)?: Total Help needed standing up from a chair using your arms (e.g., wheelchair or bedside chair)?: Total Help needed to walk in hospital room?: Total Help needed  climbing 3-5 steps with a railing? : Total 6 Click Score: 7    End of Session Equipment Utilized During Treatment: Gait belt Activity Tolerance: Patient tolerated treatment well Patient left: in chair;with call bell/phone within reach;with chair alarm set Nurse Communication: Mobility status PT Visit Diagnosis: Other abnormalities of gait and mobility (R26.89);Muscle weakness (generalized) (M62.81);Difficulty in walking, not elsewhere classified (R26.2);Other symptoms and signs involving the nervous system (R29.898);Hemiplegia and hemiparesis Hemiplegia - Right/Left: Left Hemiplegia - dominant/non-dominant: Non-dominant Hemiplegia - caused by: Cerebral infarction     Time: 0786-7544 PT Time Calculation (min) (ACUTE ONLY): 32 min  Charges:  $Neuromuscular Re-education: 8-22 mins                     Laurina Bustle, PT, DPT Acute Rehabilitation Services Pager [phone] Office [phone]   Vanetta Mulders 04/06/2018, 10:02 AM

## 2018-04-06 NOTE — Progress Notes (Signed)
Inpatient Rehabilitation-Admissions Coordinator   [facility] returned later in day with improved alertness noted. AC will plan for admit to CIR tomorrow, pending medical approval from Attending and assessment from PM&R MD, Dr. Maryla Morrow. AC has spoken to pt and her daughter who are willing to pursue with CIR plans.   Please call if questions.   Nanine Means, OTR/L  Rehab Admissions Coordinator  [phone] 04/06/2018 5:38 PM

## 2018-04-07 ENCOUNTER — Inpatient Hospital Stay (HOSPITAL_COMMUNITY)
Admission: RE | Admit: 2018-04-07 | Discharge: 2018-05-05 | DRG: 057 | Disposition: A | Discharge status: Home Health | Payer: Medicare Other | Attending: Physical Medicine & Rehabilitation | Admitting: Physical Medicine & Rehabilitation

## 2018-04-07 ENCOUNTER — Other Ambulatory Visit: Payer: Self-pay

## 2018-04-07 DIAGNOSIS — Z79899 Other long term (current) drug therapy: Secondary | ICD-10-CM

## 2018-04-07 DIAGNOSIS — Z7401 Bed confinement status: Secondary | ICD-10-CM | POA: Diagnosis not present

## 2018-04-07 DIAGNOSIS — Z96653 Presence of artificial knee joint, bilateral: Secondary | ICD-10-CM | POA: Diagnosis present

## 2018-04-07 DIAGNOSIS — Z823 Family history of stroke: Secondary | ICD-10-CM

## 2018-04-07 DIAGNOSIS — I69391 Dysphagia following cerebral infarction: Secondary | ICD-10-CM | POA: Diagnosis not present

## 2018-04-07 DIAGNOSIS — R531 Weakness: Secondary | ICD-10-CM

## 2018-04-07 DIAGNOSIS — N183 Chronic kidney disease, stage 3 unspecified: Secondary | ICD-10-CM

## 2018-04-07 DIAGNOSIS — I639 Cerebral infarction, unspecified: Secondary | ICD-10-CM

## 2018-04-07 DIAGNOSIS — R1310 Dysphagia, unspecified: Secondary | ICD-10-CM | POA: Diagnosis present

## 2018-04-07 DIAGNOSIS — I69354 Hemiplegia and hemiparesis following cerebral infarction affecting left non-dominant side: Secondary | ICD-10-CM | POA: Diagnosis not present

## 2018-04-07 DIAGNOSIS — Z7982 Long term (current) use of aspirin: Secondary | ICD-10-CM | POA: Diagnosis not present

## 2018-04-07 DIAGNOSIS — M542 Cervicalgia: Secondary | ICD-10-CM | POA: Diagnosis not present

## 2018-04-07 DIAGNOSIS — R109 Unspecified abdominal pain: Secondary | ICD-10-CM | POA: Diagnosis not present

## 2018-04-07 DIAGNOSIS — E1122 Type 2 diabetes mellitus with diabetic chronic kidney disease: Secondary | ICD-10-CM | POA: Diagnosis present

## 2018-04-07 DIAGNOSIS — E039 Hypothyroidism, unspecified: Secondary | ICD-10-CM

## 2018-04-07 DIAGNOSIS — G8114 Spastic hemiplegia affecting left nondominant side: Secondary | ICD-10-CM

## 2018-04-07 DIAGNOSIS — Z833 Family history of diabetes mellitus: Secondary | ICD-10-CM | POA: Diagnosis not present

## 2018-04-07 DIAGNOSIS — Z794 Long term (current) use of insulin: Secondary | ICD-10-CM

## 2018-04-07 DIAGNOSIS — Z7902 Long term (current) use of antithrombotics/antiplatelets: Secondary | ICD-10-CM | POA: Diagnosis not present

## 2018-04-07 DIAGNOSIS — K5900 Constipation, unspecified: Secondary | ICD-10-CM | POA: Diagnosis present

## 2018-04-07 DIAGNOSIS — E785 Hyperlipidemia, unspecified: Secondary | ICD-10-CM | POA: Diagnosis present

## 2018-04-07 DIAGNOSIS — Z90710 Acquired absence of both cervix and uterus: Secondary | ICD-10-CM

## 2018-04-07 DIAGNOSIS — D72829 Elevated white blood cell count, unspecified: Secondary | ICD-10-CM | POA: Diagnosis not present

## 2018-04-07 DIAGNOSIS — H9190 Unspecified hearing loss, unspecified ear: Secondary | ICD-10-CM | POA: Diagnosis present

## 2018-04-07 DIAGNOSIS — I129 Hypertensive chronic kidney disease with stage 1 through stage 4 chronic kidney disease, or unspecified chronic kidney disease: Secondary | ICD-10-CM | POA: Diagnosis present

## 2018-04-07 DIAGNOSIS — M792 Neuralgia and neuritis, unspecified: Secondary | ICD-10-CM

## 2018-04-07 DIAGNOSIS — R414 Neurologic neglect syndrome: Secondary | ICD-10-CM | POA: Diagnosis present

## 2018-04-07 DIAGNOSIS — R4182 Altered mental status, unspecified: Secondary | ICD-10-CM | POA: Diagnosis not present

## 2018-04-07 DIAGNOSIS — R7309 Other abnormal glucose: Secondary | ICD-10-CM

## 2018-04-07 DIAGNOSIS — D62 Acute posthemorrhagic anemia: Secondary | ICD-10-CM | POA: Diagnosis present

## 2018-04-07 DIAGNOSIS — T17800A Unspecified foreign body in other parts of respiratory tract causing asphyxiation, initial encounter: Secondary | ICD-10-CM

## 2018-04-07 DIAGNOSIS — F17220 Nicotine dependence, chewing tobacco, uncomplicated: Secondary | ICD-10-CM | POA: Diagnosis present

## 2018-04-07 DIAGNOSIS — E1151 Type 2 diabetes mellitus with diabetic peripheral angiopathy without gangrene: Secondary | ICD-10-CM | POA: Diagnosis present

## 2018-04-07 DIAGNOSIS — M2550 Pain in unspecified joint: Secondary | ICD-10-CM | POA: Diagnosis not present

## 2018-04-07 DIAGNOSIS — E119 Type 2 diabetes mellitus without complications: Secondary | ICD-10-CM | POA: Diagnosis not present

## 2018-04-07 DIAGNOSIS — Z79890 Hormone replacement therapy (postmenopausal): Secondary | ICD-10-CM | POA: Diagnosis not present

## 2018-04-07 DIAGNOSIS — I69321 Dysphasia following cerebral infarction: Secondary | ICD-10-CM

## 2018-04-07 DIAGNOSIS — I69322 Dysarthria following cerebral infarction: Secondary | ICD-10-CM

## 2018-04-07 DIAGNOSIS — I10 Essential (primary) hypertension: Secondary | ICD-10-CM

## 2018-04-07 DIAGNOSIS — R29898 Other symptoms and signs involving the musculoskeletal system: Secondary | ICD-10-CM | POA: Diagnosis not present

## 2018-04-07 DIAGNOSIS — I63511 Cerebral infarction due to unspecified occlusion or stenosis of right middle cerebral artery: Secondary | ICD-10-CM

## 2018-04-07 DIAGNOSIS — Z96641 Presence of right artificial hip joint: Secondary | ICD-10-CM | POA: Diagnosis present

## 2018-04-07 DIAGNOSIS — I6601 Occlusion and stenosis of right middle cerebral artery: Secondary | ICD-10-CM

## 2018-04-07 LAB — BASIC METABOLIC PANEL
Anion gap: 7 (ref 5–15)
BUN: 15 mg/dL (ref 8–23)
CO2: 21 mmol/L — ABNORMAL LOW (ref 22–32)
Calcium: 9.3 mg/dL (ref 8.9–10.3)
Chloride: 109 mmol/L (ref 98–111)
Creatinine, Ser: 1.11 mg/dL — ABNORMAL HIGH (ref 0.44–1.00)
GFR calc Af Amer: 54 mL/min — ABNORMAL LOW (ref >60)
GFR calc non Af Amer: 47 mL/min — ABNORMAL LOW (ref >60)
Glucose, Bld: 129 mg/dL — ABNORMAL HIGH (ref 70–99)
Potassium: 3.7 mmol/L (ref 3.5–5.1)
Sodium: 137 mmol/L (ref 135–145)

## 2018-04-07 LAB — GLUCOSE, CAPILLARY
Glucose-Capillary: 170 mg/dL — ABNORMAL HIGH (ref 70–99)
Glucose-Capillary: 223 mg/dL — ABNORMAL HIGH (ref 70–99)
Glucose-Capillary: 218 mg/dL — ABNORMAL HIGH (ref 70–99)
Glucose-Capillary: 183 mg/dL — ABNORMAL HIGH (ref 70–99)
Glucose-Capillary: 256 mg/dL — ABNORMAL HIGH (ref 70–99)

## 2018-04-07 LAB — CBC
HCT: 27.7 % — ABNORMAL LOW (ref 36.0–46.0)
Hemoglobin: 9.3 g/dL — ABNORMAL LOW (ref 12.0–15.0)
MCH: 31.7 pg (ref 26.0–34.0)
MCHC: 33.6 g/dL (ref 30.0–36.0)
MCV: 94.5 fL (ref 80.0–100.0)
Platelets: 197 10*3/uL (ref 150–400)
RBC: 2.93 MIL/uL — ABNORMAL LOW (ref 3.87–5.11)
RDW: 13.1 % (ref 11.5–15.5)
WBC: 9.5 10*3/uL (ref 4.0–10.5)
nRBC: 0 % (ref 0.0–0.2)

## 2018-04-07 MED ORDER — LEVOTHYROXINE SODIUM 50 MCG PO TABS
62.5000 ug | ORAL_TABLET | Freq: Every day | ORAL | Status: DC
  Administered 2018-04-08 – 2018-05-05 (×28): 62.5 ug via ORAL
  Filled 2018-04-07 (×28): qty 1

## 2018-04-07 MED ORDER — GABAPENTIN 100 MG PO CAPS
100.0000 mg | ORAL_CAPSULE | Freq: Two times a day (BID) | ORAL | Status: DC
  Administered 2018-04-07 – 2018-04-10 (×6): 100 mg via ORAL
  Filled 2018-04-07 (×6): qty 1

## 2018-04-07 MED ORDER — BUPROPION HCL ER (XL) 300 MG PO TB24
300.0000 mg | ORAL_TABLET | Freq: Every day | ORAL | Status: DC
  Administered 2018-04-08 – 2018-04-11 (×4): 300 mg via ORAL
  Filled 2018-04-07 (×5): qty 1

## 2018-04-07 MED ORDER — AMLODIPINE BESYLATE 5 MG PO TABS: 5.0000 mg | ORAL_TABLET | Freq: Every day | ORAL | Status: DC

## 2018-04-07 MED ORDER — ESCITALOPRAM OXALATE 10 MG PO TABS
5.0000 mg | ORAL_TABLET | Freq: Every day | ORAL | Status: DC
  Administered 2018-04-07 – 2018-05-04 (×28): 5 mg via ORAL
  Filled 2018-04-07 (×28): qty 1

## 2018-04-07 MED ORDER — ACETAMINOPHEN 160 MG/5ML PO SOLN
650.0000 mg | ORAL | Status: DC | PRN
  Administered 2018-04-25: 650 mg
  Filled 2018-04-07: qty 20.3

## 2018-04-07 MED ORDER — HYDROXYZINE HCL 25 MG PO TABS
50.0000 mg | ORAL_TABLET | Freq: Every day | ORAL | Status: DC
  Administered 2018-04-07 – 2018-05-04 (×28): 50 mg via ORAL
  Filled 2018-04-07 (×29): qty 2

## 2018-04-07 MED ORDER — ASPIRIN EC 81 MG PO TBEC: 81.0000 mg | DELAYED_RELEASE_TABLET | Freq: Every day | ORAL | Status: DC

## 2018-04-07 MED ORDER — GLIPIZIDE ER 2.5 MG PO TB24
2.5000 mg | ORAL_TABLET | Freq: Every day | ORAL | Status: DC
  Administered 2018-04-08 – 2018-04-12 (×5): 2.5 mg via ORAL
  Filled 2018-04-07 (×5): qty 1

## 2018-04-07 MED ORDER — CLOPIDOGREL BISULFATE 75 MG PO TABS
75.0000 mg | ORAL_TABLET | Freq: Every day | ORAL | Status: DC
  Administered 2018-04-08 – 2018-05-05 (×28): 75 mg via ORAL
  Filled 2018-04-07 (×28): qty 1

## 2018-04-07 MED ORDER — ACETAMINOPHEN-CODEINE #3 300-30 MG PO TABS
1.0000 | ORAL_TABLET | Freq: Four times a day (QID) | ORAL | Status: DC | PRN
  Administered 2018-04-08 – 2018-04-10 (×6): 1 via ORAL
  Filled 2018-04-07 (×6): qty 1

## 2018-04-07 MED ORDER — INSULIN ASPART 100 UNIT/ML ~~LOC~~ SOLN
0.0000 [IU] | Freq: Three times a day (TID) | SUBCUTANEOUS | Status: DC
  Administered 2018-04-07: 3 [IU] via SUBCUTANEOUS
  Administered 2018-04-08: 2 [IU] via SUBCUTANEOUS
  Administered 2018-04-08: 08:00:00 3 [IU] via SUBCUTANEOUS
  Administered 2018-04-08: 11 [IU] via SUBCUTANEOUS
  Administered 2018-04-09: 18:00:00 5 [IU] via SUBCUTANEOUS
  Administered 2018-04-09: 2 [IU] via SUBCUTANEOUS
  Administered 2018-04-09: 08:00:00 8 [IU] via SUBCUTANEOUS
  Administered 2018-04-10: 3 [IU] via SUBCUTANEOUS
  Administered 2018-04-10 (×2): 5 [IU] via SUBCUTANEOUS
  Administered 2018-04-11: 13:00:00 3 [IU] via SUBCUTANEOUS
  Administered 2018-04-11: 2 [IU] via SUBCUTANEOUS
  Administered 2018-04-12 (×3): 3 [IU] via SUBCUTANEOUS
  Administered 2018-04-13: 12:00:00 5 [IU] via SUBCUTANEOUS
  Administered 2018-04-13: 08:00:00 3 [IU] via SUBCUTANEOUS
  Administered 2018-04-13: 17:00:00 2 [IU] via SUBCUTANEOUS
  Administered 2018-04-14: 13:00:00 5 [IU] via SUBCUTANEOUS
  Administered 2018-04-14: 3 [IU] via SUBCUTANEOUS
  Administered 2018-04-15: 5 [IU] via SUBCUTANEOUS
  Administered 2018-04-15: 3 [IU] via SUBCUTANEOUS
  Administered 2018-04-15: 09:00:00 2 [IU] via SUBCUTANEOUS
  Administered 2018-04-16: 3 [IU] via SUBCUTANEOUS
  Administered 2018-04-16 (×2): 2 [IU] via SUBCUTANEOUS
  Administered 2018-04-17: 13:00:00 3 [IU] via SUBCUTANEOUS
  Administered 2018-04-17 – 2018-04-18 (×4): 2 [IU] via SUBCUTANEOUS
  Administered 2018-04-18 – 2018-04-19 (×2): 3 [IU] via SUBCUTANEOUS
  Administered 2018-04-19: 2 [IU] via SUBCUTANEOUS
  Administered 2018-04-19: 12:00:00 5 [IU] via SUBCUTANEOUS
  Administered 2018-04-20: 2 [IU] via SUBCUTANEOUS
  Administered 2018-04-20: 09:00:00 3 [IU] via SUBCUTANEOUS
  Administered 2018-04-21 (×2): 2 [IU] via SUBCUTANEOUS
  Administered 2018-04-22: 13:00:00 3 [IU] via SUBCUTANEOUS
  Administered 2018-04-22: 5 [IU] via SUBCUTANEOUS
  Administered 2018-04-23 – 2018-04-24 (×2): 3 [IU] via SUBCUTANEOUS
  Administered 2018-04-25: 2 [IU] via SUBCUTANEOUS
  Administered 2018-04-25 – 2018-04-26 (×2): 3 [IU] via SUBCUTANEOUS
  Administered 2018-04-27 – 2018-04-28 (×3): 5 [IU] via SUBCUTANEOUS
  Administered 2018-04-28: 18:00:00 3 [IU] via SUBCUTANEOUS
  Administered 2018-04-29: 5 [IU] via SUBCUTANEOUS
  Administered 2018-04-29: 18:00:00 2 [IU] via SUBCUTANEOUS
  Administered 2018-04-30: 18:00:00 3 [IU] via SUBCUTANEOUS
  Administered 2018-04-30: 12:00:00 5 [IU] via SUBCUTANEOUS
  Administered 2018-05-01: 2 [IU] via SUBCUTANEOUS
  Administered 2018-05-01: 14:00:00 8 [IU] via SUBCUTANEOUS
  Administered 2018-05-02: 5 [IU] via SUBCUTANEOUS
  Administered 2018-05-02 – 2018-05-03 (×2): 2 [IU] via SUBCUTANEOUS
  Administered 2018-05-03 (×2): 3 [IU] via SUBCUTANEOUS
  Administered 2018-05-04: 11 [IU] via SUBCUTANEOUS
  Administered 2018-05-04: 18:00:00 5 [IU] via SUBCUTANEOUS
  Administered 2018-05-05: 09:00:00 3 [IU] via SUBCUTANEOUS

## 2018-04-07 MED ORDER — INSULIN GLARGINE 100 UNIT/ML ~~LOC~~ SOLN
12.0000 [IU] | Freq: Every day | SUBCUTANEOUS | Status: DC
  Administered 2018-04-07 – 2018-05-04 (×28): 12 [IU] via SUBCUTANEOUS
  Filled 2018-04-07 (×32): qty 0.12

## 2018-04-07 MED ORDER — LISINOPRIL 40 MG PO TABS
40.0000 mg | ORAL_TABLET | Freq: Every day | ORAL | Status: DC
  Administered 2018-04-08 – 2018-05-05 (×28): 40 mg via ORAL
  Filled 2018-04-07 (×28): qty 1

## 2018-04-07 MED ORDER — CARVEDILOL 12.5 MG PO TABS
12.5000 mg | ORAL_TABLET | Freq: Two times a day (BID) | ORAL | Status: DC
  Administered 2018-04-07 – 2018-05-05 (×56): 12.5 mg via ORAL
  Filled 2018-04-07 (×56): qty 1

## 2018-04-07 MED ORDER — AMLODIPINE BESYLATE 5 MG PO TABS
5.0000 mg | ORAL_TABLET | Freq: Every day | ORAL | Status: DC
  Administered 2018-04-08 – 2018-05-05 (×28): 5 mg via ORAL
  Filled 2018-04-07 (×28): qty 1

## 2018-04-07 MED ORDER — METHOCARBAMOL 500 MG PO TABS
500.0000 mg | ORAL_TABLET | Freq: Once | ORAL | Status: AC
  Administered 2018-04-07: 500 mg via ORAL
  Filled 2018-04-07: qty 1

## 2018-04-07 MED ORDER — ATORVASTATIN CALCIUM 10 MG PO TABS
10.0000 mg | ORAL_TABLET | Freq: Every day | ORAL | Status: DC
  Administered 2018-04-07 – 2018-05-04 (×28): 10 mg via ORAL
  Filled 2018-04-07 (×28): qty 1

## 2018-04-07 MED ORDER — ASPIRIN EC 81 MG PO TBEC
81.0000 mg | DELAYED_RELEASE_TABLET | Freq: Every day | ORAL | Status: DC
  Administered 2018-04-08 – 2018-04-14 (×7): 81 mg via ORAL
  Filled 2018-04-07 (×7): qty 1

## 2018-04-07 MED ORDER — ACETAMINOPHEN 650 MG RE SUPP
650.0000 mg | RECTAL | Status: DC | PRN
  Filled 2018-04-07 (×2): qty 1

## 2018-04-07 MED ORDER — ACETAMINOPHEN 325 MG PO TABS
650.0000 mg | ORAL_TABLET | ORAL | Status: DC | PRN
  Administered 2018-04-07 – 2018-05-05 (×27): 650 mg via ORAL
  Filled 2018-04-07 (×30): qty 2

## 2018-04-07 NOTE — Progress Notes (Signed)
Jamse Arn, MD  Physician  Physical Medicine and Rehabilitation  PMR Pre-admission  Signed  Date of Service:  04/07/2018 9:48 AM       Related encounter: ED to Hosp-Admission (Discharged) from 04/04/2018 in [facility]      Signed         PMR Admission Coordinator Pre-Admission Assessment  Patient: Jessica Meyer is an 83 y.o., female MRN: [id number] DOB: 07-10-1935 Height: 5' 6"  (167.6 cm) Weight: 63.1 kg  Insurance Information HMO:     PPO:      PCP:      IPA:      80/20: Yes     OTHER:  PRIMARY:  Medicare Part A and B     Policy#: [id number]      Subscriber: Patient CM Name:       Phone#:      Fax#:  Pre-Cert#:       Employer:  Benefits:  Phone #: NA     Name: verified eligibility via onesource on 04/06/18 Eff. Date: Part A effective 12/04/02; Part B effective: 12/04/02     Deduct: $1,408      Out of Pocket Max: NA      Life Max: NA CIR: Covered per Medicare Guidelines once yearly deductible is met.       SNF: days 1-20, 100%, days 21-100, 80% Outpatient: 80%     Co-Pay: 20% Home Health: 100%      Co-Pay:  DME: 80%     Co-Pay: 20% Providers: Pt's choice SECONDARY: BCBS      Policy#: [id number]      Subscriber: Patient CM Name:       Phone#:      Fax#:  Pre-Cert#:       Employer:  Benefits:  Phone #: [phone]     Name:  Eff. Date:      Deduct:       Out of Pocket Max:       Life Max:  CIR:       SNF:  Outpatient:      Co-Pay:  Home Health:       Co-Pay:  DME:      Co-Pay:   Medicaid Application Date:       Case Manager:  Disability Application Date:       Case Worker:   The "Data Collection Information Summary" for patients in Inpatient Rehabilitation Facilities with attached "Privacy Act [place] Records" was provided and verbally reviewed with: Family  Emergency Contact Information         Contact Information    Name Relation Home Work Mobile   Grant,Debbie Daughter [phone]     Grant,Sidney Relative  [phone]        Current Medical History  Patient Admitting Diagnosis: R MCA and B cerebral infarcts in setting of R ICA and R MCA occlusion   History of Present Illness: Jessica Meyer is an 83 year old female with history of hypertension, hyperlipidemia, diabetes mellitus, bilateral total knee replacement. Per chart review patient lives alone. Pt presented 04/04/2018 with fall and left side weakness. No loss of consciousness. CT of the head showed asymmetric hyperattenuation right MCA in the sylvian fissure. CT angiogram of head and neck showed occluded right ICA with trace reconstitution. Critical stenosis left ICA origin. Severe stenosis of bilateral vertebral artery origins, patent vertebral arteries. CT perfusion scan showed a large area right MCA penumbra with 9 mL right basal ganglia and possible temporal lobe cortical  infarction. MRI shows moderate size acute right MCA infarction with additional punctate acute infarctions cerebellum bilaterally. Attempts at mechanical thrombectomy unsuccessful. Echocardiogram with ejection fraction of 65% hyperdynamic systolic function.No source of embolus. Pt currently on a Dysphagia #1 diet with nectar thick liquid.  Therapy evaluation completed with recommendations for CIR. Patient is to be admitted for a comprehensive rehabilitation program on 04/06/18.  Complete NIHSS TOTAL: 14  Patient's medical record from [facility] has been reviewed by the rehabilitation admission coordinator and physician.  Past Medical History      Past Medical History:  Diagnosis Date  . Arthritis   . Diabetes mellitus without complication ([place])   . History of chicken pox   . Hyperlipidemia   . Hypertension   . Thyroid disease    hypothyroid    Family History   family history includes Arthritis in her mother; Depression in her mother; Heart attack in her father; Hypertension in her father.  Prior Rehab/Hospitalizations Has the  patient had prior rehab or hospitalizations prior to admission? No  Has the patient had major surgery during 100 days prior to admission? Yes             Current Medications  Current Facility-Administered Medications:  .  acetaminophen (TYLENOL) tablet 650 mg, 650 mg, Oral, Q4H PRN, 650 mg at 04/07/18 0356 **OR** acetaminophen (TYLENOL) solution 650 mg, 650 mg, Per Tube, Q4H PRN **OR** acetaminophen (TYLENOL) suppository 650 mg, 650 mg, Rectal, Q4H PRN, Luanne Bras, MD, 650 mg at 04/05/18 0845 .  acetaminophen-codeine (TYLENOL #3) 300-30 MG per tablet 1 tablet, 1 tablet, Oral, Q6H PRN, Donzetta Starch, NP, 1 tablet at 04/06/18 0854 .  amLODipine (NORVASC) tablet 5 mg, 5 mg, Oral, Daily, Biby, Sharon L, NP, 5 mg at 04/06/18 0854 .  aspirin EC tablet 81 mg, 81 mg, Oral, Daily, Biby, Sharon L, NP, 81 mg at 04/06/18 0854 .  atorvastatin (LIPITOR) tablet 10 mg, 10 mg, Oral, QHS, Biby, Sharon L, NP, 10 mg at 04/06/18 2140 .  buPROPion (WELLBUTRIN XL) 24 hr tablet 300 mg, 300 mg, Oral, Daily, Biby, Sharon L, NP, 300 mg at 04/06/18 0855 .  carvedilol (COREG) tablet 12.5 mg, 12.5 mg, Oral, BID WC, Biby, Sharon L, NP, 12.5 mg at 04/07/18 0826 .  clopidogrel (PLAVIX) tablet 75 mg, 75 mg, Oral, Daily, Garvin Fila, MD, 75 mg at 04/06/18 0847 .  escitalopram (LEXAPRO) tablet 5 mg, 5 mg, Oral, QHS, Biby, Sharon L, NP, 5 mg at 04/06/18 2141 .  glipiZIDE (GLUCOTROL XL) 24 hr tablet 2.5 mg, 2.5 mg, Oral, QAC breakfast, Leonie Man, Pramod S, MD, 2.5 mg at 04/07/18 0826 .  hydrOXYzine (ATARAX/VISTARIL) tablet 50 mg, 50 mg, Oral, QHS, Garvin Fila, MD, 50 mg at 04/06/18 2140 .  insulin aspart (novoLOG) injection 0-15 Units, 0-15 Units, Subcutaneous, TID WC, Greta Doom, MD, 5 Units at 04/07/18 0827 .  insulin glargine (LANTUS) injection 12 Units, 12 Units, Subcutaneous, QHS, Biby, Sharon L, NP, 12 Units at 04/07/18 0012 .  levothyroxine (SYNTHROID, LEVOTHROID) tablet 62.5 mcg, 62.5 mcg, Oral,  QAC breakfast, Biby, Sharon L, NP, 62.5 mcg at 04/07/18 0614 .  lisinopril (PRINIVIL,ZESTRIL) tablet 40 mg, 40 mg, Oral, Daily, Biby, Sharon L, NP, 40 mg at 04/06/18 7412  Patients Current Diet:     Diet Order                  DIET - DYS 1 Room service appropriate? Yes; Fluid consistency: Nectar Thick  Diet effective now               Precautions / Restrictions Precautions Precautions: Fall Precaution Comments: left inattention and right gaze, increased extensor tone LLE and Flexion tone LUE at shoulder and elbow Restrictions Weight Bearing Restrictions: No   Has the patient had 2 or more falls or a fall with injury in the past year? No  Prior Activity Level Community (5-7x/wk): pt was retired and not driving but would do grocery shopping with family, walk neighborhood and get out of the house 3-4x/week  Prior Functional Level Self Care: Did the patient need help bathing, dressing, using the toilet or eating? Independent  Indoor Mobility: Did the patient need assistance with walking from room to room (with or without device)? Independent  Stairs: Did the patient need assistance with internal or external stairs (with or without device)? Independent  Functional Cognition: Did the patient need help planning regular tasks such as shopping or remembering to take medications? Independent  Home Assistive Devices / Equipment Home Equipment: Shower seat, Bedside commode, Cane - single point  Prior Device Use: Indicate devices/aids used by the patient prior to current illness, exacerbation or injury? has cane but does not use regularily   Current Functional Level Cognition  Arousal/Alertness: Awake/alert Overall Cognitive Status: Impaired/Different from baseline Current Attention Level: Sustained Orientation Level: Oriented to person, Oriented to place, Disoriented to time, Disoriented to situation Following Commands: Follows one step commands inconsistently,  Follows one step commands with increased time Safety/Judgement: Decreased awareness of safety, Decreased awareness of deficits General Comments: Improved ability to follow one step commands this session with increased time and redirection to task. Hyperfocused on "I can't move my left side," when asked to perform a task Attention: Sustained Sustained Attention: Impaired Sustained Attention Impairment: Verbal basic Memory: Impaired Memory Impairment: Retrieval deficit Awareness: Impaired Awareness Impairment: Emergent impairment Problem Solving: Impaired Problem Solving Impairment: Verbal basic, Functional basic Executive Function: Reasoning Reasoning: Impaired Behaviors: Impulsive Safety/Judgment: Impaired    Extremity Assessment (includes Sensation/Coordination)  Upper Extremity Assessment: LUE deficits/detail LUE Deficits / Details: initially appears flaccid though after transfer to recliner noted increased tone/spasticity, some withdrawal noted to painful stimuli LUE Coordination: decreased fine motor, decreased gross motor  Lower Extremity Assessment: Defer to PT evaluation LLE Deficits / Details: extensor tone in bed  with ability to break into flexion, upon standing pt returns to extensor tone with increased plantar flexion. Pt with withdrawal to noxious stimuli     ADLs  Overall ADL's : Needs assistance/impaired Eating/Feeding: NPO Eating/Feeding Details (indicate cue type and reason): pt perseverating on wanting water this session, SLP following up today for swallow eval Grooming: Brushing hair, Minimal assistance, Sitting Grooming Details (indicate cue type and reason): required max questioning cues to notice she had comb in her hand, once redirected to task pt able to perform while in supported sitting in recliner Upper Body Bathing: Moderate assistance, Sitting Lower Body Bathing: Maximal assistance, +2 for physical assistance, +2 for safety/equipment, Sitting/lateral  leans, Sit to/from stand Upper Body Dressing : Moderate assistance, Sitting Lower Body Dressing: +2 for physical assistance, +2 for safety/equipment, Maximal assistance, Sit to/from stand Toilet Transfer: Maximal assistance, +2 for physical assistance, +2 for safety/equipment, Stand-pivot Toilet Transfer Details (indicate cue type and reason): with total A for weight shifting for pt to step with RLE Toileting- Clothing Manipulation and Hygiene: Maximal assistance, +2 for physical assistance, +2 for safety/equipment, Sit to/from stand Functional mobility during ADLs: Maximal assistance, +2 for physical assistance, +  2 for safety/equipment(stand pivot transfer ) General ADL Comments: pt with significant L lateral lean, poor sitting and standing balance, L inattention, impaired cognition    Mobility  Overal bed mobility: Needs Assistance Bed Mobility: Supine to Sit Supine to sit: Max assist General bed mobility comments: physical assist to bring LLE to EOB going towards left side and assist to elevate trunk. hand over hand guidance for utilization of rail on left side    Transfers  Overall transfer level: Needs assistance Equipment used: 2 person hand held assist Transfers: Sit to/from Stand, Stand Pivot Transfers Sit to Stand: Max assist, +2 safety/equipment, +2 physical assistance Stand pivot transfers: Max assist, +2 safety/equipment, +2 physical assistance General transfer comment: max assist +2 to stand from bed with bil UE supported and LLE blocked. Hyperextension of L knee noted; manual facilitation provided for increased L ankle dorsiflexion. Physical assist to pivot to chair and scoot back on surface.     Ambulation / Gait / Stairs / Wheelchair Mobility  Ambulation/Gait General Gait Details: unable    Posture / Balance Dynamic Sitting Balance Sitting balance - Comments: varying levels of assist from max assist to intermittent periods of min guard for static sitting balance. Pt  exhibits decreased trunk/core control with initial heavy right lateral lean. Manual facilitation provided with visual cueing for achieving midline position; pt unable to maintain for significant period of time Balance Overall balance assessment: Needs assistance Sitting balance-Leahy Scale: Poor Sitting balance - Comments: varying levels of assist from max assist to intermittent periods of min guard for static sitting balance. Pt exhibits decreased trunk/core control with initial heavy right lateral lean. Manual facilitation provided with visual cueing for achieving midline position; pt unable to maintain for significant period of time Postural control: Posterior lean, Left lateral lean Standing balance support: Bilateral upper extremity supported Standing balance-Leahy Scale: Zero    Special needs/care consideration BiPAP/CPAP : no CPM : no Continuous Drip IV : no Dialysis : no        Days : no Life Vest : no Oxygen : no, RA Special Bed : no Trach Size : no Wound Vac (area) : no      Location : no Skin: surgical incision           Bowel mgmt: PTA, unknown Bladder mgmt: incontinent  Diabetic mgmt: yes Behavioral consideration : restless Chemo/radiation : no   Previous Home Environment (from acute therapy documentation) Living Arrangements: Alone  Lives With: Alone Available Help at Discharge: Family Type of Home: House Home Layout: One level Home Access: Stairs to enter Entrance Stairs-Rails: Right Entrance Stairs-Number of Steps: 3 Bathroom Shower/Tub: Chiropodist: Standard Home Care Services: No  Discharge Living Setting Plans for Discharge Living Setting: Other (Comment)(daughter's house ) Type of Home at Discharge: House Discharge Home Layout: Two level, Able to live on main level with bedroom/bathroom Alternate Level Stairs-Rails: None(NA) Alternate Level Stairs-Number of Steps: NA Discharge Home Access: Stairs to enter Entrance Stairs-Rails:  None Entrance Stairs-Number of Steps: 2-3 steps Discharge Bathroom Shower/Tub: Tub/shower unit Discharge Bathroom Toilet: Standard Discharge Bathroom Accessibility: No Does the patient have any problems obtaining your medications?: No  Social/Family/Support Systems Patient Roles: Other (Comment)(has grown children; grandchildren nearby; takes care of dog) Contact Information: daughter: [name]: [phone] Anticipated Caregiver: [name] and grandchildren Anticipated Caregiver's Contact Information: see above Ability/Limitations of Caregiver: mod A Caregiver Availability: 24/7 Discharge Plan Discussed with Primary Caregiver: Yes Is Caregiver In Agreement with Plan?: Yes Does Caregiver/Family have Issues  with Lodging/Transportation while Pt is in Rehab?: No  Goals/Additional Needs Patient/Family Goal for Rehab: PT/OT: Mod A; SLP:MIn A  Expected length of stay: 14-18 days Cultural Considerations: [employer] Dietary Needs: DYS 1, nectar thick liquid Equipment Needs: TBD Pt/Family Agrees to Admission and willing to participate: Yes Program Orientation Provided & Reviewed with Pt/Caregiver Including Roles  & Responsibilities: Yes(pt and daughter)  Barriers to Discharge: Home environment access/layout  Barriers to Discharge Comments: steps to enter; tub shower   Decrease burden of Care through IP rehab admission: NA  Possible need for SNF placement upon discharge: Not anticipated; pt has family assistance at DC that are aware she will need 24/7 hands on assistance at DC. Daughter able to take FMLA and grandchildren to assist as well.   Patient Condition: I have reviewed medical records from [facility], spoken with RN, and patient and daughter. I met with patient at the bedside for inpatient rehabilitation assessment and discussed program details with pt's daughter via phone  Patient will benefit from ongoing PT, OT and SLP, can actively participate in 3 hours of  therapy a day 5 days of the week, and can make measurable gains during the admission.  Patient will also benefit from the coordinated team approach during an Inpatient Acute Rehabilitation admission.  The patient will receive intensive therapy as well as Rehabilitation physician, nursing, social worker, and care management interventions.  Due to bladder management, bowel management, safety, skin/wound care, disease management, medication administration, pain management and patient education the patient requires 24 hour a day rehabilitation nursing.  The patient is currently Max A x2 for transfers and basic ADLs.  Discharge setting and therapy post discharge at home with home health is anticipated.  Patient has agreed to participate in the Acute Inpatient Rehabilitation Program and will admit tomorrow.  Preadmission Screen Completed By:  Jhonnie Garner, 04/07/2018 10:35 AM ______________________________________________________________________   Discussed status with Dr. Posey Pronto on 04/06/18 at 12:18PM and received approval for admission 04/07/18.  Admission Coordinator:  Jhonnie Garner, OT, time 10:22PM/Date 04/07/18   Assessment/Plan: Diagnosis: R MCA and B cerebral infarcts in setting of R ICA and R MCA occlusion  1. Does the need for close, 24 hr/day Medical supervision in concert with the patient's rehab needs make it unreasonable for this patient to be served in a less intensive setting? Yes  2. Co-Morbidities requiring supervision/potential complications: hypertension, hyperlipidemia, diabetes mellitus, bilateral total knee replacement 3. Due to bladder management, bowel management, safety, disease management, medication administration and patient education, does the patient require 24 hr/day rehab nursing? Yes 4. Does the patient require coordinated care of a physician, rehab nurse, PT (1-2 hrs/day, 5 days/week), OT (1-2 hrs/day, 5 days/week) and SLP (1-2 hrs/day, 5 days/week) to address physical and  functional deficits in the context of the above medical diagnosis(es)? Yes Addressing deficits in the following areas: balance, endurance, locomotion, strength, transferring, bathing, dressing, toileting, cognition, speech, language, swallowing and psychosocial support 5. Can the patient actively participate in an intensive therapy program of at least 3 hrs of therapy 5 days a week? Yes 6. The potential for patient to make measurable gains while on inpatient rehab is excellent 7. Anticipated functional outcomes upon discharge from inpatients are: min assist PT, min assist OT, modified independent and supervision SLP 8. Estimated rehab length of stay to reach the above functional goals is: 18-23 days. 9. Anticipated D/C setting: Home 10. Anticipated post D/C treatments: HH therapy and Home excercise program 11. Overall Rehab/Functional Prognosis: good  MD Signature: Delice Lesch, MD, ABPMR        Revision History    Date/Time User Provider Type Action  04/07/2018 10:35 AM Jamse Arn, MD Physician Sign  04/06/2018 6:12 PM Jhonnie Garner, [place] [facility] Admission Coordinator Share  View Details Report

## 2018-04-07 NOTE — Plan of Care (Addendum)
  Problem: RH BOWEL ELIMINATION Goal: RH STG MANAGE BOWEL WITH ASSISTANCE mod Description STG Manage Bowel with Assistance. Outcome: Not Progressing; LBM PTA per report   Problem: RH BLADDER ELIMINATION Goal: RH STG MANAGE BLADDER WITH ASSISTANCE mod Description STG Manage Bladder With Assistance Outcome: Not Progressing; incontinence   Problem: RH PAIN MANAGEMENT Goal: RH STG PAIN MANAGED AT OR BELOW PT'S PAIN GOAL; pain less than 2 Outcome: Not Progressing; c/o left lower leg pain 10/10 md notified

## 2018-04-07 NOTE — Discharge Summary (Signed)
Patient ID: Jessica Meyer   MRN: [id number]      DOB: 1935-06-16  Date of Admission: 04/04/2018 Date of Discharge: 04/07/2018  Attending Physician:  Garvin Fila, MD, Stroke MD Consultant(s):  Treatment Team:  Stroke, Md, MD None Patient's PCP:  Debbrah Alar, NP  DISCHARGE DIAGNOSIS:  R MCA and B cerebral infarcts in setting of R ICA and R MCA occlusion unsuccessful attempt at revascularization. Active Problems:   Stroke (cerebrum) (HCC)   Middle cerebral artery embolism, right  Acute blood loss anemia s/p IR, Hgb 12.8->11.0  Arthritis  Leukocytosis 13.4->12.9  Severe canal stenosis C2-3. Moderate C2-3 and C3-4 neural foraminal narrowing  Severe cerebrovascular disease - critical stenosis LEFT ICA origin    Past Medical History:  Diagnosis Date  . Arthritis   . Diabetes mellitus without complication ([place])   . History of chicken pox   . Hyperlipidemia   . Hypertension   . Thyroid disease    hypothyroid   Past Surgical History:  Procedure Laterality Date  . ABDOMINAL HYSTERECTOMY    . HIP ARTHROPLASTY Right 01/26/2016   Procedure: ARTHROPLASTY BIPOLAR HIP (HEMIARTHROPLASTY);  Surgeon: Renette Butters, MD;  Location: [place];  Service: Orthopedics;  Laterality: Right;  . KNEE ARTHROSCOPY Bilateral   . RADIOLOGY WITH ANESTHESIA N/A 04/04/2018   Procedure: RADIOLOGY WITH ANESTHESIA;  Surgeon: Radiologist, Medication, MD;  Location: [place];  Service: Radiology;  Laterality: N/A;  . REPLACEMENT TOTAL KNEE BILATERAL     1998 left , 1992 right    HOSPITAL MEDICATIONS . amLODipine  5 mg Oral Daily  . aspirin EC  81 mg Oral Daily  . atorvastatin  10 mg Oral QHS  . buPROPion  300 mg Oral Daily  . carvedilol  12.5 mg Oral BID WC  . clopidogrel  75 mg Oral Daily  . escitalopram  5 mg Oral QHS  . glipiZIDE  2.5 mg Oral QAC breakfast  . hydrOXYzine  50 mg Oral QHS  . insulin aspart  0-15 Units Subcutaneous TID WC  . insulin glargine  12 Units Subcutaneous QHS  .  levothyroxine  62.5 mcg Oral QAC breakfast  . lisinopril  40 mg Oral Daily    HOME MEDICATIONS PRIOR TO ADMISSION Medications Prior to Admission  Medication Sig Dispense Refill  . ACCU-CHEK SOFTCLIX LANCETS lancets Use as instructed to check blood sugar twice a day.  DX E11.9 200 each 1  . amLODipine (NORVASC) 5 MG tablet TAKE 1 TABLET BY MOUTH DAILY (Patient taking differently: Take 5 mg by mouth daily. ) 90 tablet 1  . aspirin EC 81 MG tablet Take 81 mg by mouth daily.    Marland Kitchen atorvastatin (LIPITOR) 10 MG tablet Take 1 tablet (10 mg total) by mouth at bedtime. 90 tablet 3  . B-D UF III MINI PEN NEEDLES 31G X 5 MM MISC USE DAILY AS DIRECTED 100 each 1  . Blood Glucose Monitoring Suppl (ACCU-CHEK AVIVA PLUS) w/Device KIT USE AS DIRECTED 1 kit 0  . buPROPion (WELLBUTRIN XL) 300 MG 24 hr tablet TAKE 1 TABLET BY MOUTH DAILY (Patient taking differently: Take 300 mg by mouth daily. ) 90 tablet 1  . carvedilol (COREG) 12.5 MG tablet TAKE 1 TABLET BY MOUTH TWO TIMES A DAY WITH MEAL (Patient taking differently: Take 12.5 mg by mouth 2 (two) times daily with a meal. ) 180 tablet 3  . escitalopram (LEXAPRO) 5 MG tablet TAKE ONE TABLET BY MOUTH DAILY (Patient taking differently: Take  5 mg by mouth at bedtime. ) 30 tablet 5  . glipiZIDE (GLUCOTROL XL) 2.5 MG 24 hr tablet Take 1 tablet (2.5 mg total) by mouth daily before breakfast. 90 tablet 3  . glucose blood (ACCU-CHEK AVIVA PLUS) test strip USE TO TEST BLOOD SUGAR TWO TIMES A DAY  Dx. E11.22, N18.3, Z79.4 200 each 1  . hydrOXYzine (ATARAX/VISTARIL) 25 MG tablet TAKE ONE TABLET BY MOUTH TWO TIMES A DAY AS NEEDED (Patient taking differently: Take 50 mg by mouth at bedtime. ) 180 tablet 0  . Insulin Glargine (LANTUS) 100 UNIT/ML Solostar Pen USE 17 UNTIS  UNDER THE SKIN BEFORE LUNCH AS DIRECTED (Patient taking differently: Inject 17 Units into the skin daily. ) 15 pen 2  . Lancets Misc. (ACCU-CHEK SOFTCLIX LANCET DEV) KIT Use to check blood sugar twice a day.   DX E11.22, N18.3, Z79.4 1 kit 0  . levothyroxine (SYNTHROID, LEVOTHROID) 125 MCG tablet TAKE ONE HALF TABLET BY MOUTH EVERY MORNING BEFORE BREAKFAST (Patient taking differently: Take 62.5 mcg by mouth daily before breakfast. ) 45 tablet 0  . lisinopril (PRINIVIL,ZESTRIL) 40 MG tablet TAKE ONE TABLET BY MOUTH DAILY (Patient taking differently: Take 40 mg by mouth daily. ) 90 tablet 0  . Zoster Vaccine Adjuvanted [facility]) injection Inject 0.20m IM now and again in 2-6 months. (Patient not taking: Reported on 04/05/2018) 0.5 mL 1    LABORATORY STUDIES CBC    Component Value Date/Time   WBC 9.5 04/07/2018 0232   RBC 2.93 (L) 04/07/2018 0232   HGB 9.3 (L) 04/07/2018 0232   HCT 27.7 (L) 04/07/2018 0232   PLT 197 04/07/2018 0232   MCV 94.5 04/07/2018 0232   MCH 31.7 04/07/2018 0232   MCHC 33.6 04/07/2018 0232   RDW 13.1 04/07/2018 0232   LYMPHSABS 0.9 04/05/2018 0412   MONOABS 0.7 04/05/2018 0412   EOSABS 0.0 04/05/2018 0412   BASOSABS 0.0 04/05/2018 0412   CMP    Component Value Date/Time   NA 137 04/07/2018 0232   K 3.7 04/07/2018 0232   CL 109 04/07/2018 0232   CO2 21 (L) 04/07/2018 0232   GLUCOSE 129 (H) 04/07/2018 0232   BUN 15 04/07/2018 0232   CREATININE 1.11 (H) 04/07/2018 0232   CALCIUM 9.3 04/07/2018 0232   PROT 7.6 04/04/2018 2130   ALBUMIN 4.3 04/04/2018 2130   AST 27 04/04/2018 2130   ALT 18 04/04/2018 2130   ALKPHOS 102 04/04/2018 2130   BILITOT 1.0 04/04/2018 2130   GFRNONAA 47 (L) 04/07/2018 0232   GFRAA 54 (L) 04/07/2018 0232   COAGS Lab Results  Component Value Date   INR 1.1 04/04/2018   INR 1.12 01/26/2016   Lipid Panel    Component Value Date/Time   CHOL 131 04/05/2018 0412   TRIG 130 04/05/2018 0412   HDL 42 04/05/2018 0412   CHOLHDL 3.1 04/05/2018 0412   VLDL 26 04/05/2018 0412   LDLCALC 63 04/05/2018 0412   HgbA1C  Lab Results  Component Value Date   HGBA1C 8.0 (H) 04/05/2018   Urinalysis    Component Value Date/Time   COLORURINE  YELLOW 01/25/2016 1240   APPEARANCEUR CLEAR 01/25/2016 1240   LABSPEC 1.010 01/25/2016 1240   PHURINE 8.0 01/25/2016 1240   GLUCOSEU >=500 (A) 01/25/2016 1240   GLUCOSEU 500 (A) 07/29/2014 1215   HGBUR NEGATIVE 01/25/2016 [place]01/22/2018 1240   KETONESUR NEGATIVE 01/25/2016 1240   PROTEINUR NEGATIVE 01/25/2016 1240   UROBILINOGEN 0.2 07/29/2014 1215  NITRITE NEGATIVE 01/25/2016 1240   LEUKOCYTESUR NEGATIVE 01/25/2016 1240   Urine Drug Screen No results found for: LABOPIA, COCAINSCRNUR, LABBENZ, AMPHETMU, THCU, LABBARB  Alcohol Level    Component Value Date/Time   [facility] <10 04/04/2018 2130     SIGNIFICANT DIAGNOSTIC STUDIES  Ct Angio Head W Or Wo Contrast Result Date: 04/04/2018 CLINICAL DATA:  History of hypertension and hyperlipidemia. LEFT facial droop and LEFT extremity weakness. EXAM: CT ANGIOGRAPHY HEAD AND NECK CT PERFUSION BRAIN TECHNIQUE: Multidetector CT imaging of the head and neck was performed using the standard protocol during bolus administration of intravenous contrast. Multiplanar CT image reconstructions and MIPs were obtained to evaluate the vascular anatomy. Carotid stenosis measurements (when applicable) are obtained utilizing NASCET criteria, using the distal internal carotid diameter as the denominator. Multiphase CT imaging of the brain was performed following IV bolus contrast injection. Subsequent parametric perfusion maps were calculated using RAPID software. CONTRAST:  116m OMNIPAQUE IOHEXOL 350 MG/ML SOLN COMPARISON:  CT HEAD April 04, 2018 at 2137 hours. FINDINGS: CTA NECK FINDINGS: AORTIC ARCH: Normal appearance of the thoracic arch, normal branch pattern. Mild calcific atherosclerosis aortic arch. The origins of the innominate, left Common carotid artery and subclavian artery are patent. Mild RIGHT and moderate LEFT subclavian artery stenosis due to atherosclerosis. RIGHT CAROTID SYSTEM: Common carotid artery is patent. Calcific  atherosclerosis, occluded RIGHT internal carotid artery with thready faint recanalization. LEFT CAROTID SYSTEM: Common carotid artery is patent. Calcific atherosclerosis with short segment critical stenosis LEFT ICA origin. VERTEBRAL ARTERIES:Left vertebral artery is dominant. Severe stenosis bilateral vertebral artery origins. Patent vertebral arteries. SKELETON: No acute osseous process though bone windows have not been submitted. OTHER NECK: Soft tissues of the neck are nonacute though, not tailored for evaluation. C2-3 canal stenosis due to disc osteophyte complex. UPPER CHEST: Included lung apices are clear. No superior mediastinal lymphadenopathy. CTA HEAD FINDINGS: ANTERIOR CIRCULATION: RIGHT petrous 2 cavernous ICA occluded with minimal reconstitution at carotid siphon. LEFT internal carotid artery is patent with moderate stenosis supraclinoid segment. Patent bilateral carotid termini. Emergent RIGHT M1 occlusion with intermediate collateralization by single-phase CTA. Patent anterior cerebral arteries and LEFT MCA. Moderate tandem stenosis LEFT ACA. No  contrast extravasation or aneurysm. POSTERIOR CIRCULATION: Patent vertebral arteries, vertebrobasilar junction and basilar artery, as well as main branch vessels. Severe stenosis RIGHT V4 segment. Patent posterior cerebral arteries, mild tandem stenoses compatible with atherosclerosis. No large vessel occlusion, flow-limiting stenosis, contrast extravasation or aneurysm. VENOUS SINUSES: Major dural venous sinuses are patent though not tailored for evaluation on this angiographic examination. ANATOMIC VARIANTS: None. DELAYED PHASE: Not performed. MIP images reviewed. CT Brain Perfusion Findings: Moderate motion degraded examination. CBF (<30%) Volume: 19mPerfusion (Tmax>6.0s) volume: 8611mismatch Volume: 89m59mfarction Location:RIGHT frontotemporal parietal penumbra with RIGHT basal ganglia, RIGHT periventricular and RIGHT temporal core infarcts.   IMPRESSION:   CTA NECK:  1. Occluded RIGHT ICA with trace reconstitution.  2. Critical stenosis LEFT ICA origin.  3. Severe stenosis bilateral vertebral artery origins, patent vertebral arteries.   CTA HEAD:  1. Occluded RIGHT ICA with reconstitution at carotid terminus. Emergent RIGHT M1 occlusion with intermediate collaterals by single-phase   CTA. CT PERFUSION:  1. Motion degraded examination. Large area RIGHT MCA penumbra with 9 cc RIGHT basal ganglia and possible temporal lobe core infarcts.  2. Moderate stenoses LEFT ACA.     Mr Brain Wo Contrast 04/05/2018 CLINICAL DATA:  Left-sided weakness. Right M1 occlusion with unsuccessful attempted revascularization due to proximal right ICA occlusion. EXAM: MRI HEAD  WITHOUT CONTRAST TECHNIQUE: Multiplanar, multiecho pulse sequences of the brain and surrounding structures were obtained without intravenous contrast. COMPARISON:  Head CT, CTA, and CTP for 120 FINDINGS: Some sequences are moderately to severely motion degraded. Brain: There is a moderate-sized acute right MCA territory infarct involving the temporal lobe, insula, basal ganglia, and frontoparietal operculum. Additional small foci of predominantly cortical acute infarction are present more superiorly in the right frontal and parietal lobes. There are also multiple small acute infarcts involving left frontal and parietal cortex, left centrum semiovale, and superior left occipital lobe. No associated hemorrhage is evident within limitations of motion artifact. Multiple small chronic infarcts involving right frontoparietal cortex and white matter in a parasagittal distribution likely reflect remote watershed ischemia. Patchy T2 hyperintensities elsewhere in the cerebral white matter bilaterally are nonspecific but compatible with moderate chronic small vessel ischemic disease. There is moderate central predominant cerebral atrophy with asymmetric dilatation of the left lateral ventricle.  Small chronic infarcts are present in the cerebellum bilaterally. No mass/mass effect or extra-axial fluid collection is identified. Vascular: Abnormal appearance of the proximal intracranial right ICA related to known proximal occlusion. Skull and upper cervical spine: Grossly unremarkable bone marrow signal. Sinuses/Orbits: Unremarkable orbits. Small left mastoid effusion. Clear paranasal sinuses. Other: None. IMPRESSION:  1. Moderate-sized acute right MCA infarct.  2. Additional punctate acute infarcts in the cerebrum bilaterally.  3. Moderate chronic small vessel ischemic disease with chronic lacunar infarcts as above.   Ct C-spine No Charge 04/04/2018 CLINICAL DATA:  Neck pain. EXAM: CT CERVICAL SPINE WITHOUT CONTRAST TECHNIQUE: Multidetector reformatted imaging of the cervical spine was performed from CTA NECK. COMPARISON:  None. FINDINGS: ALIGNMENT: Straightened lordosis.  No malalignment. SKULL BASE AND VERTEBRAE: Cervical vertebral bodies and posterior elements are intact. Moderate C2-3, C5-6 disc height loss, mild at C3-4 with endplate spurring compatible with degenerative discs. C1-2 articulation maintained with mild arthropathy. Osteopenia without destructive bony lesions. Small RIGHT C7 rib. SOFT TISSUES AND SPINAL CANAL: Please see dedicated CTA from same day, reported separately for vascular findings. DISC LEVELS: Severe canal stenosis C2-3, mild at C3-4. Moderate RIGHT C2-3, RIGHT C3-4 neural foraminal narrowing. UPPER CHEST: Lung apices are clear. OTHER: None.  IMPRESSION:  1. No fracture or malalignment.  2. Severe canal stenosis C2-3. Moderate C2-3 and C3-4 neural foraminal narrowing.    Ct Head Code Stroke Wo Contrast 04/04/2018 IMPRESSION:  1. Asymmetric hyperattenuation RIGHT MCA in the Sylvian fissure could reflect acute thrombus in one or both M2 branches.  2. Atrophy and small vessel disease.  3. ASPECTS is 10.    Transthoracic Echocardiogram  04/05/2018 IMPRESSIONS  1.  The left ventricle has hyperdynamic systolic function, with an ejection fraction of >65%. The cavity size was normal. There is mildly increased left ventricular wall thickness. Left ventricular diastolic Doppler parameters are indeterminate. No  evidence of left ventricular regional wall motion abnormalities.  2. The right ventricle has normal systolic function. The cavity was normal. There is no increase in right ventricular wall thickness.  3. The mitral valve is grossly normal.  4. The tricuspid valve is grossly normal.  5. The aortic root and ascending aorta are normal in size and structure.  Cerebral Angiogram with Intervention 04/04/2018 S/P  Bilateral common carotid arteriograms followed unsuccessful attempt at revascularization of Rt MCA occlusion due to prox Rt ICA occlusion despite multiple angioplasties.     HISTORY OF PRESENT ILLNESS (From H&P by Dr. Leonel Ramsay on 04/04/2018) Joene Gelder is a 83 y.o. female with a  history of hypertension, hyperlipidemia, diabetes who presents with left-sided weakness that started abruptly around 4 PM.  Patient's daughter talked to her around 3:30 PM, and then patient states that she fell down around 4 PM, and suspect it was her left-sided weakness that caused her to fall.  Her daughter tried to call her several times, and she was not answering her phone, and therefore around 9 PM her daughter went to check on her and found her on the ground.  EMS was activated and she was brought into the emergency department as a code stroke.  On arrival, it was noted that she had some neglect as well as left-sided weakness and therefore a CT angios/perfusion was performed.  The initial perfusion was complicated by significant movement artifact and therefore it was repeated demonstrated a large area of penumbra with relatively small core.  At baseline, she is completely independent and takes care of all of her own activities of daily living.  She lives alone.  LKW:  4 PM tpa given?: No, outside of window IR Thrombectomy?  Yes Modified Rankin Scale: 0-Completely asymptomatic and back to baseline post- stroke NIHSS:   HOSPITAL COURSE Ms. Leisha Erla Bacchi is a 83 y.o. female with history of HTN, HLD, DB found down with L sided weakness. No tPA d/t out of the window.  Cerebral Angiogram with Intervention S/P  Bilateral common carotid arteriograms followed unsuccessful attempt at revascularization of Rt MCA occlusion due to prox Rt ICA occlusion despite multiple angioplasties.  Stroke:  R MCA and B cerebral infarcts in setting of R ICA and R MCA occlusion s/p unsuccessful IR, infarcts secondary to large vessel disease from proximal right ICA stenosis/occlusion  Code Stroke CT head asymmetric R MCA ? Thrombus one or both M2 branches. Small vessel disease. Atrophy. ASPECTS 10.     CTA head occluded R ICA. ELVO R M1 occlusion  CTA neck occluded R ICA. Critical stenosis L ICA origin. Severe B VA stenosis origins.   CT perfusion large R MCA penumbra w/ 9xx F BG and possible temporal lobe infarcts. Mod stenosis L ACA.  CT CS no fx. Severe C2-3 canal stenosis. Mod C2-3 and C3-4 neural foraminal narrowing  Cerebral angio attempted revascularization R MCA, R ICA occlusion  Post IR CT results not documented  MRI  Mod R MCA infarct. Addition B cerebellar infarcts. Small vessel disease. Chronic lacunes.   2D Echo EF >65%. No source of embolus   LDL 63  HgbA1c 8.0  SCDs for VTE prophylaxis  aspirin 81 mg daily prior to admission, now on aspirin 81 mg daily and clopidogrel 75 mg daily. Continue x 3 months then PLAVIX alone  Therapy recommendations:  CIR  Disposition: discharge to inpatient rehabilitation at [place]  DNR at pt request   Dysphagia   Secondary to stroke   On D1 nectar thick diet  Headache  Secondary to stroke  Ok tylenol supp  Add Tylenol #3 once able to swallow  Hypertension  Stable  BP goal per IR x 24h  Treated w/  cleviprex  Home meds: norvasc 5, coreg 12.5 bid, lisinopril 40  Resume home meds  Hyperlipidemia  Home meds:  lipitor 10, resumed in hospital  LDL 63, goal < 70  Continue statin at discharge  Diabetes type II, uncontrolled  HgbA1c 8.0, goal < 7.0  Home meds: lantus 17, glipizide 2.5  Glucoses 164-244  Mod SSI tid  Add lantus 12 as recommended by DB RN  Other Stroke Risk Factors  Advanced age  Hx snuff use  ETOH use, advised to drink no more than 1 drink(s) a day  Other Active Problems  Acute blood loss anemia s/p IR, Hgb 12.8->11.0  Arthritis  Leukocytosis 13.4->12.9  Severe canal stenosis C2-3. Moderate C2-3 and C3-4 neural foraminal narrowing  Severe cerebrovascular disease - critical stenosis LEFT ICA origin -> outpatient evaluation.   DISCHARGE EXAM Vitals:   04/06/18 1956 04/07/18 0009 04/07/18 0401 04/07/18 0820  BP: 135/64 (!) 152/60 (!) 147/73 127/62  Pulse: (!) 113 64 77 60  Resp: 14 16 18 18   Temp: 98.4 F (36.9 C) (!) 97.4 F (36.3 C) 97.9 F (36.6 C) 98.1 F (36.7 C)  TempSrc: Oral Oral Oral Oral  SpO2: 91% 95% 93% 95%  Weight:      Height:       Pleasant elderly Caucasian lady not in distress. . Afebrile. Head is nontraumatic. Neck is supple without bruit.    Cardiac exam no murmur or gallop. Lungs are clear to auscultation. Distal pulses are well felt. Neurological Exam ;  She is awake and alert oriented 2. Diminished attention, registration and recall. No aphasia,apraxia dysarthria. Forced right gaze duration. Unable to look to the left. Blinks to threat on the right but not on the left.Mild left hemibody neglect and left hemi-visual neglect.left lower facial weakness. Tongue midline. Spontaneous antigravity movements on the right side without weakness. And left hemiplegia with only trace withdrawal to painful stimuli in the lower extremities. Tone is diminished on the left and normal on the right. Left plantar upgoing right  downgoing.  Discharge Diet   Diet Order            DIET - DYS 1 Room service appropriate? Yes; Fluid consistency: Nectar Thick  Diet effective now             liquids  DISCHARGE PLAN  Disposition:  Transfer to [facility] for ongoing PT, OT and ST  aspirin 81 mg daily and clopidogrel 75 mg daily for secondary stroke prevention for 3 months then Plavix alone.  Recommend ongoing risk factor control by Primary Care Physician at time of discharge from inpatient rehabilitation.  Follow-up Debbrah Alar, NP in 2 weeks following discharge from rehab.  Follow-up in [facility] in 4 weeks following discharge from rehab, office to schedule an appointment.   35 minutes were spent preparing discharge.  Mikey Bussing PA-C [facility] Pager [phone] 04/07/2018, 3:06 PM

## 2018-04-07 NOTE — Progress Notes (Signed)
Inpatient Rehabilitation-Admissions Coordinator   [facility] has received medical clearance for admit to CIR today. AC has notified pt and her family on plan with all in agreement. RN notified on plan.   Please call if questions.   Nanine Means, OTR/L  Rehab Admissions Coordinator  [phone] 04/07/2018 10:32 AM

## 2018-04-07 NOTE — H&P (Signed)
Physical Medicine and Rehabilitation Admission H&P    Chief Complaint  Patient presents with   Code Stroke   Chief complaint: left side weakness  HPI: Jessica Meyer is an 83 year old right handed female history of hypertension, hyperlipidemia, diabetes mellitus, bilateral total knee replacement. Per chart review and family, patient lives alone. Independent prior to admission. One level home with 3 steps to entry. She performed her own ADLs as well as housework and cooking. She does not drive. Presented 04/04/2018 with fall and left-sided weakness. No loss of consciousness. CT of the head showed asymmetric hyperattenuation right MCA in the sylvian fissure. CT angiogram of head and neck showed occluded right ICA with trace reconstitution. Critical stenosis left ICA origin. Severe stenosis of bilateral vertebral artery origins, patent vertebral arteries. CT perfusion scan showed a large area right MCA penumbra with 9 mL right basal ganglia and possible temporal lobe cortical infarction. MRI brain reviewed, showing bilateral infarcts, >moderate to large right MCA infarct.  Per report, right MCA infarction with additional punctate acute infarctions cerebellum bilaterally. Attempts at mechanical thrombectomy unsuccessful. Echocardiogram with ejection fraction of 65 % hyperdynamic systolic function.No source of embolus. Neurology follow-up maintained on aspirin 81 mg daily and Plavix 3 months and Plavix alone.  Hospital complicated by dysphagia #1 nectar thick liquid. Therapy evaluation completed with recommendations of physical medicine rehabilitation consult. Patient was admitted for a comprehensive rehabilitation program.  Please see preadmission assessment from today as well.  Review of Systems  Constitutional: Negative for chills and fever.  HENT: Negative for hearing loss.   Eyes: Negative for blurred vision and double vision.  Respiratory: Negative for cough and shortness of breath.     Cardiovascular: Negative for chest pain, palpitations and leg swelling.  Gastrointestinal: Positive for constipation. Negative for nausea and vomiting.  Genitourinary: Negative for dysuria, flank pain and hematuria.  Musculoskeletal: Positive for joint pain and myalgias.       Noted fall leading to admission with left-sided weakness.  Skin: Negative for rash.  Neurological: Positive for sensory change, speech change, focal weakness and weakness.  Psychiatric/Behavioral: Positive for depression. The patient has insomnia.   All other systems reviewed and are negative.  Past Medical History:  Diagnosis Date   Arthritis    Diabetes mellitus without complication ([place])    History of chicken pox    Hyperlipidemia    Hypertension    Thyroid disease    hypothyroid   Past Surgical History:  Procedure Laterality Date   ABDOMINAL HYSTERECTOMY     HIP ARTHROPLASTY Right 01/26/2016   Procedure: ARTHROPLASTY BIPOLAR HIP (HEMIARTHROPLASTY);  Surgeon: Renette Butters, MD;  Location: [place];  Service: Orthopedics;  Laterality: Right;   KNEE ARTHROSCOPY Bilateral    RADIOLOGY WITH ANESTHESIA N/A 04/04/2018   Procedure: RADIOLOGY WITH ANESTHESIA;  Surgeon: Radiologist, Medication, MD;  Location: [place];  Service: Radiology;  Laterality: N/A;   REPLACEMENT TOTAL KNEE BILATERAL     1998 left , 1992 right   Family History  Problem Relation Age of Onset   Arthritis Mother    Depression Mother    Heart attack Father        40    Hypertension Father    Social History:  reports that she has never smoked. Her smokeless tobacco use includes snuff. She reports current alcohol use of about 1.0 standard drinks of alcohol per week. She reports that she does not use drugs. Allergies: No Known Allergies Medications Prior to Admission  Medication  Sig Dispense Refill   ACCU-CHEK SOFTCLIX LANCETS lancets Use as instructed to check blood sugar twice a day.  DX E11.9 200 each 1   amLODipine  (NORVASC) 5 MG tablet TAKE 1 TABLET BY MOUTH DAILY (Patient taking differently: Take 5 mg by mouth daily. ) 90 tablet 1   aspirin EC 81 MG tablet Take 81 mg by mouth daily.     atorvastatin (LIPITOR) 10 MG tablet Take 1 tablet (10 mg total) by mouth at bedtime. 90 tablet 3   B-D UF III MINI PEN NEEDLES 31G X 5 MM MISC USE DAILY AS DIRECTED 100 each 1   Blood Glucose Monitoring Suppl (ACCU-CHEK AVIVA PLUS) w/Device KIT USE AS DIRECTED 1 kit 0   buPROPion (WELLBUTRIN XL) 300 MG 24 hr tablet TAKE 1 TABLET BY MOUTH DAILY (Patient taking differently: Take 300 mg by mouth daily. ) 90 tablet 1   carvedilol (COREG) 12.5 MG tablet TAKE 1 TABLET BY MOUTH TWO TIMES A DAY WITH MEAL (Patient taking differently: Take 12.5 mg by mouth 2 (two) times daily with a meal. ) 180 tablet 3   escitalopram (LEXAPRO) 5 MG tablet TAKE ONE TABLET BY MOUTH DAILY (Patient taking differently: Take 5 mg by mouth at bedtime. ) 30 tablet 5   glipiZIDE (GLUCOTROL XL) 2.5 MG 24 hr tablet Take 1 tablet (2.5 mg total) by mouth daily before breakfast. 90 tablet 3   glucose blood (ACCU-CHEK AVIVA PLUS) test strip USE TO TEST BLOOD SUGAR TWO TIMES A DAY  Dx. E11.22, N18.3, Z79.4 200 each 1   hydrOXYzine (ATARAX/VISTARIL) 25 MG tablet TAKE ONE TABLET BY MOUTH TWO TIMES A DAY AS NEEDED (Patient taking differently: Take 50 mg by mouth at bedtime. ) 180 tablet 0   Insulin Glargine (LANTUS) 100 UNIT/ML Solostar Pen USE 17 UNTIS  UNDER THE SKIN BEFORE LUNCH AS DIRECTED (Patient taking differently: Inject 17 Units into the skin daily. ) 15 pen 2   Lancets Misc. (ACCU-CHEK SOFTCLIX LANCET DEV) KIT Use to check blood sugar twice a day.  DX E11.22, N18.3, Z79.4 1 kit 0   levothyroxine (SYNTHROID, LEVOTHROID) 125 MCG tablet TAKE ONE HALF TABLET BY MOUTH EVERY MORNING BEFORE BREAKFAST (Patient taking differently: Take 62.5 mcg by mouth daily before breakfast. ) 45 tablet 0   lisinopril (PRINIVIL,ZESTRIL) 40 MG tablet TAKE ONE TABLET BY  MOUTH DAILY (Patient taking differently: Take 40 mg by mouth daily. ) 90 tablet 0   Zoster Vaccine Adjuvanted [facility]) injection Inject 0.5m IM now and again in 2-6 months. (Patient not taking: Reported on 04/05/2018) 0.5 mL 1    Drug Regimen Review Drug regimen was reviewed and remains appropriate with no significant issues identified  Home: Home Living Family/patient expects to be discharged to:: Private residence Living Arrangements: Alone Available Help at Discharge: Family Type of Home: House Home Access: Stairs to enter ETechnical brewerof Steps: 3 Entrance Stairs-Rails: Right Home Layout: One level Bathroom Shower/Tub: TChiropodist Standard Home Equipment: SCivil engineer, contracting Bedside commode, CRadio producer- single point  Lives With: Alone   Functional History: Prior Function Level of Independence: Independent Comments: lives alone performs ADLs, housework, cooking and puzzles, doesn't drive  Functional Status:  Mobility: Bed Mobility Overal bed mobility: Needs Assistance Bed Mobility: Supine to Sit Supine to sit: Max assist General bed mobility comments: physical assist to bring LLE to EOB going towards left side and assist to elevate trunk. hand over hand guidance for utilization of rail on left side Transfers Overall transfer  level: Needs assistance Equipment used: 2 person hand held assist Transfers: Sit to/from Stand, Stand Pivot Transfers Sit to Stand: Max assist, +2 safety/equipment, +2 physical assistance Stand pivot transfers: Max assist, +2 safety/equipment, +2 physical assistance General transfer comment: max assist +2 to stand from bed with bil UE supported and LLE blocked. Hyperextension of L knee noted; manual facilitation provided for increased L ankle dorsiflexion. Physical assist to pivot to chair and scoot back on surface.  Ambulation/Gait General Gait Details: unable    ADL: ADL Overall ADL's : Needs  assistance/impaired Eating/Feeding: NPO Eating/Feeding Details (indicate cue type and reason): pt perseverating on wanting water this session, SLP following up today for swallow eval Grooming: Brushing hair, Minimal assistance, Sitting Grooming Details (indicate cue type and reason): required max questioning cues to notice she had comb in her hand, once redirected to task pt able to perform while in supported sitting in recliner Upper Body Bathing: Moderate assistance, Sitting Lower Body Bathing: Maximal assistance, +2 for physical assistance, +2 for safety/equipment, Sitting/lateral leans, Sit to/from stand Upper Body Dressing : Moderate assistance, Sitting Lower Body Dressing: +2 for physical assistance, +2 for safety/equipment, Maximal assistance, Sit to/from stand Toilet Transfer: Maximal assistance, +2 for physical assistance, +2 for safety/equipment, Stand-pivot Toilet Transfer Details (indicate cue type and reason): with total A for weight shifting for pt to step with RLE Toileting- Clothing Manipulation and Hygiene: Maximal assistance, +2 for physical assistance, +2 for safety/equipment, Sit to/from stand Functional mobility during ADLs: Maximal assistance, +2 for physical assistance, +2 for safety/equipment(stand pivot transfer ) General ADL Comments: pt with significant L lateral lean, poor sitting and standing balance, L inattention, impaired cognition  Cognition: Cognition Overall Cognitive Status: Impaired/Different from baseline Arousal/Alertness: Awake/alert Orientation Level: Oriented X4 Attention: Sustained Sustained Attention: Impaired Sustained Attention Impairment: Verbal basic Memory: Impaired Memory Impairment: Retrieval deficit Awareness: Impaired Awareness Impairment: Emergent impairment Problem Solving: Impaired Problem Solving Impairment: Verbal basic, Functional basic Executive Function: Reasoning Reasoning: Impaired Behaviors: Impulsive Safety/Judgment:  Impaired Cognition Arousal/Alertness: Awake/alert Behavior During Therapy: WFL for tasks assessed/performed Overall Cognitive Status: Impaired/Different from baseline Area of Impairment: Memory, Problem solving, Following commands, Safety/judgement, Awareness, Attention Current Attention Level: Sustained Memory: Decreased short-term memory Following Commands: Follows one step commands inconsistently, Follows one step commands with increased time Safety/Judgement: Decreased awareness of safety, Decreased awareness of deficits Awareness: Intellectual Problem Solving: Slow processing, Decreased initiation, Difficulty sequencing, Requires verbal cues General Comments: Improved ability to follow one step commands this session with increased time and redirection to task. Hyperfocused on "I can't move my left side," when asked to perform a task  Physical Exam: Blood pressure 137/65, pulse 69, temperature 98.2 F (36.8 C), temperature source Oral, resp. rate 17, height 5' 6"  (1.676 m), weight 63.1 kg, SpO2 98 %. Physical Exam  Constitutional: She appears well-developed and well-nourished.  HENT:  Head: Normocephalic and atraumatic.  Eyes: EOM are normal. Right eye exhibits no discharge. Left eye exhibits no discharge.  Neck: Normal range of motion.  Cardiovascular: Normal rate and regular rhythm.  Respiratory: Effort normal and breath sounds normal.  GI: Soft. Bowel sounds are normal.  Musculoskeletal:     Comments: No edema or tenderness in extremities  Neurological: She is alert.  Left-sided neglect  Follows commands.  Fair insight to deficits. Patient weakness Dysarthria Motor: LUE/LLE: 0/5 proximal to distal RUE/RLE: 5/5 proximal to distal Sensation absent to light touch LUE/LLE  Skin: Skin is warm and dry.  Psychiatric: Her affect is blunt. Her speech is delayed.  Results for orders placed or performed during the hospital encounter of 04/04/18 (from the past 48 hour(s))   Ethanol     Status: None   Collection Time: 04/04/18  9:30 PM  Result Value Ref Range   Alcohol, Ethyl (B) <10 <10 mg/dL    Comment: (NOTE) Lowest detectable limit for serum alcohol is 10 mg/dL. For medical purposes only. Performed at [facility], [place] [address] [place], [place] [address]   Protime-INR     Status: None   Collection Time: 04/04/18  9:30 PM  Result Value Ref Range   Prothrombin Time 13.6 11.4 - 15.2 seconds   INR 1.1 0.8 - 1.2    Comment: (NOTE) INR goal varies based on device and disease states. Performed at [facility], [place] [address], [place], [place] [address]   APTT     Status: None   Collection Time: 04/04/18  9:30 PM  Result Value Ref Range   aPTT 28 24 - 36 seconds    Comment: Performed at [facility] [address] [place], [place] [address]  CBC     Status: Abnormal   Collection Time: 04/04/18  9:30 PM  Result Value Ref Range   WBC 13.4 (H) 4.0 - 10.5 K/uL   RBC 4.24 3.87 - 5.11 MIL/uL   Hemoglobin 12.8 12.0 - 15.0 g/dL   HCT 40.2 36.0 - 46.0 %   MCV 94.8 80.0 - 100.0 fL   MCH 30.2 26.0 - 34.0 pg   MCHC 31.8 30.0 - 36.0 g/dL   RDW 12.7 11.5 - 15.5 %   Platelets 240 150 - 400 K/uL   nRBC 0.0 0.0 - 0.2 %    Comment: Performed at [facility], [place] [address] [place], [place] [address]  Differential     Status: Abnormal   Collection Time: 04/04/18  9:30 PM  Result Value Ref Range   Neutrophils Relative % 86 %   Neutro Abs 11.4 (H) 1.7 - 7.7 K/uL   Lymphocytes Relative 8 %   Lymphs Abs 1.1 0.7 - 4.0 K/uL   Monocytes Relative 6 %   Monocytes Absolute 0.8 0.1 - 1.0 K/uL   Eosinophils Relative 0 %   Eosinophils Absolute 0.0 0.0 - 0.5 K/uL   Basophils Relative 0 %   Basophils Absolute 0.1 0.0 - 0.1 K/uL   Immature Granulocytes 0 %   Abs Immature Granulocytes 0.05 0.00 - 0.07 K/uL    Comment: Performed at [facility] [address], [place], [place] [address]  Comprehensive metabolic panel     Status:  Abnormal   Collection Time: 04/04/18  9:30 PM  Result Value Ref Range   Sodium 135 135 - 145 mmol/L   Potassium 4.1 3.5 - 5.1 mmol/L   Chloride 102 98 - 111 mmol/L   CO2 20 (L) 22 - 32 mmol/L   Glucose, Bld 247 (H) 70 - 99 mg/dL   BUN 17 8 - 23 mg/dL   Creatinine, Ser 1.13 (H) 0.44 - 1.00 mg/dL   Calcium 9.9 8.9 - 10.3 mg/dL   Total Protein 7.6 6.5 - 8.1 g/dL   Albumin 4.3 3.5 - 5.0 g/dL   AST 27 15 - 41 U/L   ALT 18 0 - 44 U/L   Alkaline Phosphatase 102 38 - 126 U/L   Total Bilirubin 1.0 0.3 - 1.2 mg/dL   GFR calc non Af Amer 46 (L) >60 mL/min   GFR calc Af  Amer 53 (L) >60 mL/min   Anion gap 13 5 - 15    Comment: Performed at [facility], [place] [address] [place], [place] [address]  CBG monitoring, ED     Status: Abnormal   Collection Time: 04/04/18  9:32 PM  Result Value Ref Range   Glucose-Capillary 214 (H) 70 - 99 mg/dL   Comment 1 Notify RN    Comment 2 Document in Chart   I-stat Creatinine, ED     Status: None   Collection Time: 04/04/18  9:39 PM  Result Value Ref Range   Creatinine, Ser 1.00 0.44 - 1.00 mg/dL  Glucose, capillary     Status: Abnormal   Collection Time: 04/05/18  1:33 AM  Result Value Ref Range   Glucose-Capillary 233 (H) 70 - 99 mg/dL  MRSA PCR Screening     Status: None   Collection Time: 04/05/18  2:48 AM  Result Value Ref Range   MRSA by PCR NEGATIVE NEGATIVE    Comment:        The GeneXpert MRSA Assay (FDA approved for NASAL specimens only), is one component of a comprehensive MRSA colonization surveillance program. It is not intended to diagnose MRSA infection nor to guide or monitor treatment for MRSA infections. Performed at [facility], [place] [address] [place], [place] [address]   Hemoglobin A1c     Status: Abnormal   Collection Time: 04/05/18  4:12 AM  Result Value Ref Range   Hgb A1c MFr Bld 8.0 (H) 4.8 - 5.6 %    Comment: (NOTE) Pre diabetes:          5.7%-6.4% Diabetes:              >6.4% Glycemic control for    <7.0% adults with diabetes    Mean Plasma Glucose 182.9 mg/dL    Comment: Performed at [facility] [address], [place], [place] [address]  Lipid panel     Status: None   Collection Time: 04/05/18  4:12 AM  Result Value Ref Range   Cholesterol 131 0 - 200 mg/dL   Triglycerides 130 <150 mg/dL   HDL 42 >40 mg/dL   Total CHOL/HDL Ratio 3.1 RATIO   VLDL 26 0 - 40 mg/dL   LDL Cholesterol 63 0 - 99 mg/dL    Comment:        Total Cholesterol/HDL:CHD Risk Coronary Heart Disease Risk Table                     Men   Women  1/2 Average Risk   3.4   3.3  Average Risk       5.0   4.4  2 X Average Risk   9.6   7.1  3 X Average Risk  23.4   11.0        Use the calculated Patient Ratio above and the CHD Risk Table to determine the patient's CHD Risk.        ATP III CLASSIFICATION (LDL):  <100     mg/dL   Optimal  100-129  mg/dL   Near or Above                    Optimal  130-159  mg/dL   Borderline  160-189  mg/dL   High  >190     mg/dL   Very High Performed at [facility] [address], [place], [place] [address]  CBC with Differential/Platelet     Status: Abnormal   Collection Time: 04/05/18  4:12 AM  Result Value Ref Range   WBC 12.9 (H) 4.0 - 10.5 K/uL   RBC 3.64 (L) 3.87 - 5.11 MIL/uL   Hemoglobin 11.0 (L) 12.0 - 15.0 g/dL   HCT 33.9 (L) 36.0 - 46.0 %   MCV 93.1 80.0 - 100.0 fL   MCH 30.2 26.0 - 34.0 pg   MCHC 32.4 30.0 - 36.0 g/dL   RDW 12.7 11.5 - 15.5 %   Platelets 209 150 - 400 K/uL   nRBC 0.0 0.0 - 0.2 %   Neutrophils Relative % 87 %   Neutro Abs 11.2 (H) 1.7 - 7.7 K/uL   Lymphocytes Relative 7 %   Lymphs Abs 0.9 0.7 - 4.0 K/uL   Monocytes Relative 6 %   Monocytes Absolute 0.7 0.1 - 1.0 K/uL   Eosinophils Relative 0 %   Eosinophils Absolute 0.0 0.0 - 0.5 K/uL   Basophils Relative 0 %   Basophils Absolute 0.0 0.0 - 0.1 K/uL   Immature Granulocytes 0 %   Abs Immature Granulocytes 0.05 0.00 - 0.07 K/uL    Comment: Performed at [facility], [address] [place], [place] [address]  Basic metabolic panel     Status: Abnormal   Collection Time: 04/05/18  4:12 AM  Result Value Ref Range   Sodium 135 135 - 145 mmol/L   Potassium 4.0 3.5 - 5.1 mmol/L   Chloride 106 98 - 111 mmol/L   CO2 19 (L) 22 - 32 mmol/L   Glucose, Bld 279 (H) 70 - 99 mg/dL   BUN 15 8 - 23 mg/dL   Creatinine, Ser 0.97 0.44 - 1.00 mg/dL   Calcium 8.8 (L) 8.9 - 10.3 mg/dL   GFR calc non Af Amer 55 (L) >60 mL/min   GFR calc Af Amer >60 >60 mL/min   Anion gap 10 5 - 15    Comment: Performed at [facility] [address], [place], [place] [address]  Glucose, capillary     Status: Abnormal   Collection Time: 04/05/18  8:03 AM  Result Value Ref Range   Glucose-Capillary 247 (H) 70 - 99 mg/dL   Comment 1 Notify RN    Comment 2 Document in Chart   Glucose, capillary     Status: Abnormal   Collection Time: 04/05/18 11:49 AM  Result Value Ref Range   Glucose-Capillary 212 (H) 70 - 99 mg/dL   Comment 1 Notify RN    Comment 2 Document in Chart   Glucose, capillary     Status: Abnormal   Collection Time: 04/05/18  3:23 PM  Result Value Ref Range   Glucose-Capillary 167 (H) 70 - 99 mg/dL   Comment 1 Notify RN    Comment 2 Document in Chart   Glucose, capillary     Status: Abnormal   Collection Time: 04/05/18  7:35 PM  Result Value Ref Range   Glucose-Capillary 190 (H) 70 - 99 mg/dL  Glucose, capillary     Status: Abnormal   Collection Time: 04/06/18  8:13 AM  Result Value Ref Range   Glucose-Capillary 244 (H) 70 - 99 mg/dL  Glucose, capillary     Status: Abnormal   Collection Time: 04/06/18 12:12 PM  Result Value Ref Range   Glucose-Capillary 181 (H) 70 - 99 mg/dL   Ct Angio Head W Or Wo Contrast  Result Date: 04/04/2018 CLINICAL DATA:  History  of hypertension and hyperlipidemia. LEFT facial droop and LEFT extremity weakness. EXAM: CT ANGIOGRAPHY HEAD AND NECK CT PERFUSION BRAIN TECHNIQUE: Multidetector CT imaging of the head and neck  was performed using the standard protocol during bolus administration of intravenous contrast. Multiplanar CT image reconstructions and MIPs were obtained to evaluate the vascular anatomy. Carotid stenosis measurements (when applicable) are obtained utilizing NASCET criteria, using the distal internal carotid diameter as the denominator. Multiphase CT imaging of the brain was performed following IV bolus contrast injection. Subsequent parametric perfusion maps were calculated using RAPID software. CONTRAST:  170m OMNIPAQUE IOHEXOL 350 MG/ML SOLN COMPARISON:  CT HEAD April 04, 2018 at 2137 hours. FINDINGS: CTA NECK FINDINGS: AORTIC ARCH: Normal appearance of the thoracic arch, normal branch pattern. Mild calcific atherosclerosis aortic arch. The origins of the innominate, left Common carotid artery and subclavian artery are patent. Mild RIGHT and moderate LEFT subclavian artery stenosis due to atherosclerosis. RIGHT CAROTID SYSTEM: Common carotid artery is patent. Calcific atherosclerosis, occluded RIGHT internal carotid artery with thready faint recanalization. LEFT CAROTID SYSTEM: Common carotid artery is patent. Calcific atherosclerosis with short segment critical stenosis LEFT ICA origin. VERTEBRAL ARTERIES:Left vertebral artery is dominant. Severe stenosis bilateral vertebral artery origins. Patent vertebral arteries. SKELETON: No acute osseous process though bone windows have not been submitted. OTHER NECK: Soft tissues of the neck are nonacute though, not tailored for evaluation. C2-3 canal stenosis due to disc osteophyte complex. UPPER CHEST: Included lung apices are clear. No superior mediastinal lymphadenopathy. CTA HEAD FINDINGS: ANTERIOR CIRCULATION: RIGHT petrous 2 cavernous ICA occluded with minimal reconstitution at carotid siphon. LEFT internal carotid artery is patent with moderate stenosis supraclinoid segment. Patent bilateral carotid termini. Emergent RIGHT M1 occlusion with intermediate  collateralization by single-phase CTA. Patent anterior cerebral arteries and LEFT MCA. Moderate tandem stenosis LEFT ACA. No  contrast extravasation or aneurysm. POSTERIOR CIRCULATION: Patent vertebral arteries, vertebrobasilar junction and basilar artery, as well as main branch vessels. Severe stenosis RIGHT V4 segment. Patent posterior cerebral arteries, mild tandem stenoses compatible with atherosclerosis. No large vessel occlusion, flow-limiting stenosis, contrast extravasation or aneurysm. VENOUS SINUSES: Major dural venous sinuses are patent though not tailored for evaluation on this angiographic examination. ANATOMIC VARIANTS: None. DELAYED PHASE: Not performed. MIP images reviewed. CT Brain Perfusion Findings: Moderate motion degraded examination. CBF (<30%) Volume: 148mPerfusion (Tmax>6.0s) volume: 8663mismatch Volume: 58m50mfarction Location:RIGHT frontotemporal parietal penumbra with RIGHT basal ganglia, RIGHT periventricular and RIGHT temporal core infarcts. IMPRESSION: CTA NECK: 1. Occluded RIGHT ICA with trace reconstitution. 2. Critical stenosis LEFT ICA origin. 3. Severe stenosis bilateral vertebral artery origins, patent vertebral arteries. CTA HEAD: 1. Occluded RIGHT ICA with reconstitution at carotid terminus. Emergent RIGHT M1 occlusion with intermediate collaterals by single-phase CTA. CT PERFUSION: 1. Motion degraded examination. Large area RIGHT MCA penumbra with 9 cc RIGHT basal ganglia and possible temporal lobe core infarcts. 2. Moderate stenoses LEFT ACA. Critical Value/emergent results text p[facility] AM[facility] secure system on 04/04/2018 at 10:08 pm, including interpreting physician's phone number. Acute findings discussed with and reconfirmed by Dr.MC[facility]KThe [facility] LP4/01/2018 at 10:20 pm. Aortic Atherosclerosis (ICD10-I70.0). Electronically Signed   By: CourElon Alas.   On: 04/04/2018 22:21   Ct Angio Neck W Or Wo Contrast  Result Date:  04/04/2018 CLINICAL DATA:  History of hypertension and hyperlipidemia. LEFT facial droop and LEFT extremity weakness. EXAM: CT ANGIOGRAPHY HEAD AND NECK CT PERFUSION BRAIN TECHNIQUE: Multidetector CT imaging of the head and neck was performed using the standard  protocol during bolus administration of intravenous contrast. Multiplanar CT image reconstructions and MIPs were obtained to evaluate the vascular anatomy. Carotid stenosis measurements (when applicable) are obtained utilizing NASCET criteria, using the distal internal carotid diameter as the denominator. Multiphase CT imaging of the brain was performed following IV bolus contrast injection. Subsequent parametric perfusion maps were calculated using RAPID software. CONTRAST:  141m OMNIPAQUE IOHEXOL 350 MG/ML SOLN COMPARISON:  CT HEAD April 04, 2018 at 2137 hours. FINDINGS: CTA NECK FINDINGS: AORTIC ARCH: Normal appearance of the thoracic arch, normal branch pattern. Mild calcific atherosclerosis aortic arch. The origins of the innominate, left Common carotid artery and subclavian artery are patent. Mild RIGHT and moderate LEFT subclavian artery stenosis due to atherosclerosis. RIGHT CAROTID SYSTEM: Common carotid artery is patent. Calcific atherosclerosis, occluded RIGHT internal carotid artery with thready faint recanalization. LEFT CAROTID SYSTEM: Common carotid artery is patent. Calcific atherosclerosis with short segment critical stenosis LEFT ICA origin. VERTEBRAL ARTERIES:Left vertebral artery is dominant. Severe stenosis bilateral vertebral artery origins. Patent vertebral arteries. SKELETON: No acute osseous process though bone windows have not been submitted. OTHER NECK: Soft tissues of the neck are nonacute though, not tailored for evaluation. C2-3 canal stenosis due to disc osteophyte complex. UPPER CHEST: Included lung apices are clear. No superior mediastinal lymphadenopathy. CTA HEAD FINDINGS: ANTERIOR CIRCULATION: RIGHT petrous 2 cavernous ICA  occluded with minimal reconstitution at carotid siphon. LEFT internal carotid artery is patent with moderate stenosis supraclinoid segment. Patent bilateral carotid termini. Emergent RIGHT M1 occlusion with intermediate collateralization by single-phase CTA. Patent anterior cerebral arteries and LEFT MCA. Moderate tandem stenosis LEFT ACA. No  contrast extravasation or aneurysm. POSTERIOR CIRCULATION: Patent vertebral arteries, vertebrobasilar junction and basilar artery, as well as main branch vessels. Severe stenosis RIGHT V4 segment. Patent posterior cerebral arteries, mild tandem stenoses compatible with atherosclerosis. No large vessel occlusion, flow-limiting stenosis, contrast extravasation or aneurysm. VENOUS SINUSES: Major dural venous sinuses are patent though not tailored for evaluation on this angiographic examination. ANATOMIC VARIANTS: None. DELAYED PHASE: Not performed. MIP images reviewed. CT Brain Perfusion Findings: Moderate motion degraded examination. CBF (<30%) Volume: 19mPerfusion (Tmax>6.0s) volume: 8655mismatch Volume: 4m87mfarction Location:RIGHT frontotemporal parietal penumbra with RIGHT basal ganglia, RIGHT periventricular and RIGHT temporal core infarcts. IMPRESSION: CTA NECK: 1. Occluded RIGHT ICA with trace reconstitution. 2. Critical stenosis LEFT ICA origin. 3. Severe stenosis bilateral vertebral artery origins, patent vertebral arteries. CTA HEAD: 1. Occluded RIGHT ICA with reconstitution at carotid terminus. Emergent RIGHT M1 occlusion with intermediate collaterals by single-phase CTA. CT PERFUSION: 1. Motion degraded examination. Large area RIGHT MCA penumbra with 9 cc RIGHT basal ganglia and possible temporal lobe core infarcts. 2. Moderate stenoses LEFT ACA. Critical Value/emergent results text paged to [facility] AMION secure system on 04/04/2018 at 10:08 pm, including interpreting physician's phone number. Acute findings discussed with and reconfirmed by  Dr.MCNEILL KIRK[facility]4/01/2018 at 10:20 pm. Aortic Atherosclerosis (ICD10-I70.0). Electronically Signed   By: CourElon Alas.   On: 04/04/2018 22:21   Mr Brain Wo Contrast  Result Date: 04/05/2018 CLINICAL DATA:  Left-sided weakness. Right M1 occlusion with unsuccessful attempted revascularization due to proximal right ICA occlusion. EXAM: MRI HEAD WITHOUT CONTRAST TECHNIQUE: Multiplanar, multiecho pulse sequences of the brain and surrounding structures were obtained without intravenous contrast. COMPARISON:  Head CT, CTA, and CTP for 120 FINDINGS: Some sequences are moderately to severely motion degraded. Brain: There is a moderate-sized acute right MCA territory infarct involving the temporal lobe, insula, basal ganglia, and frontoparietal  operculum. Additional small foci of predominantly cortical acute infarction are present more superiorly in the right frontal and parietal lobes. There are also multiple small acute infarcts involving left frontal and parietal cortex, left centrum semiovale, and superior left occipital lobe. No associated hemorrhage is evident within limitations of motion artifact. Multiple small chronic infarcts involving right frontoparietal cortex and white matter in a parasagittal distribution likely reflect remote watershed ischemia. Patchy T2 hyperintensities elsewhere in the cerebral white matter bilaterally are nonspecific but compatible with moderate chronic small vessel ischemic disease. There is moderate central predominant cerebral atrophy with asymmetric dilatation of the left lateral ventricle. Small chronic infarcts are present in the cerebellum bilaterally. No mass/mass effect or extra-axial fluid collection is identified. Vascular: Abnormal appearance of the proximal intracranial right ICA related to known proximal occlusion. Skull and upper cervical spine: Grossly unremarkable bone marrow signal. Sinuses/Orbits: Unremarkable orbits. Small left mastoid effusion.  Clear paranasal sinuses. Other: None. IMPRESSION: 1. Moderate-sized acute right MCA infarct. 2. Additional punctate acute infarcts in the cerebrum bilaterally. 3. Moderate chronic small vessel ischemic disease with chronic lacunar infarcts as above. Electronically Signed   By: Logan Bores M.D.   On: 04/05/2018 14:34   Ct C-spine No Charge  Result Date: 04/04/2018 CLINICAL DATA:  Neck pain. EXAM: CT CERVICAL SPINE WITHOUT CONTRAST TECHNIQUE: Multidetector reformatted imaging of the cervical spine was performed from CTA NECK. COMPARISON:  None. FINDINGS: ALIGNMENT: Straightened lordosis.  No malalignment. SKULL BASE AND VERTEBRAE: Cervical vertebral bodies and posterior elements are intact. Moderate C2-3, C5-6 disc height loss, mild at C3-4 with endplate spurring compatible with degenerative discs. C1-2 articulation maintained with mild arthropathy. Osteopenia without destructive bony lesions. Small RIGHT C7 rib. SOFT TISSUES AND SPINAL CANAL: Please see dedicated CTA from same day, reported separately for vascular findings. DISC LEVELS: Severe canal stenosis C2-3, mild at C3-4. Moderate RIGHT C2-3, RIGHT C3-4 neural foraminal narrowing. UPPER CHEST: Lung apices are clear. OTHER: None. IMPRESSION: 1. No fracture or malalignment. 2. Severe canal stenosis C2-3. Moderate C2-3 and C3-4 neural foraminal narrowing. Electronically Signed   By: Elon Alas M.D.   On: 04/04/2018 23:32   Ct Cerebral Perfusion W Contrast  Result Date: 04/04/2018 CLINICAL DATA:  History of hypertension and hyperlipidemia. LEFT facial droop and LEFT extremity weakness. EXAM: CT ANGIOGRAPHY HEAD AND NECK CT PERFUSION BRAIN TECHNIQUE: Multidetector CT imaging of the head and neck was performed using the standard protocol during bolus administration of intravenous contrast. Multiplanar CT image reconstructions and MIPs were obtained to evaluate the vascular anatomy. Carotid stenosis measurements (when applicable) are obtained utilizing  NASCET criteria, using the distal internal carotid diameter as the denominator. Multiphase CT imaging of the brain was performed following IV bolus contrast injection. Subsequent parametric perfusion maps were calculated using RAPID software. CONTRAST:  198m OMNIPAQUE IOHEXOL 350 MG/ML SOLN COMPARISON:  CT HEAD April 04, 2018 at 2137 hours. FINDINGS: CTA NECK FINDINGS: AORTIC ARCH: Normal appearance of the thoracic arch, normal branch pattern. Mild calcific atherosclerosis aortic arch. The origins of the innominate, left Common carotid artery and subclavian artery are patent. Mild RIGHT and moderate LEFT subclavian artery stenosis due to atherosclerosis. RIGHT CAROTID SYSTEM: Common carotid artery is patent. Calcific atherosclerosis, occluded RIGHT internal carotid artery with thready faint recanalization. LEFT CAROTID SYSTEM: Common carotid artery is patent. Calcific atherosclerosis with short segment critical stenosis LEFT ICA origin. VERTEBRAL ARTERIES:Left vertebral artery is dominant. Severe stenosis bilateral vertebral artery origins. Patent vertebral arteries. SKELETON: No acute osseous process though bone windows  have not been submitted. OTHER NECK: Soft tissues of the neck are nonacute though, not tailored for evaluation. C2-3 canal stenosis due to disc osteophyte complex. UPPER CHEST: Included lung apices are clear. No superior mediastinal lymphadenopathy. CTA HEAD FINDINGS: ANTERIOR CIRCULATION: RIGHT petrous 2 cavernous ICA occluded with minimal reconstitution at carotid siphon. LEFT internal carotid artery is patent with moderate stenosis supraclinoid segment. Patent bilateral carotid termini. Emergent RIGHT M1 occlusion with intermediate collateralization by single-phase CTA. Patent anterior cerebral arteries and LEFT MCA. Moderate tandem stenosis LEFT ACA. No  contrast extravasation or aneurysm. POSTERIOR CIRCULATION: Patent vertebral arteries, vertebrobasilar junction and basilar artery, as well as  main branch vessels. Severe stenosis RIGHT V4 segment. Patent posterior cerebral arteries, mild tandem stenoses compatible with atherosclerosis. No large vessel occlusion, flow-limiting stenosis, contrast extravasation or aneurysm. VENOUS SINUSES: Major dural venous sinuses are patent though not tailored for evaluation on this angiographic examination. ANATOMIC VARIANTS: None. DELAYED PHASE: Not performed. MIP images reviewed. CT Brain Perfusion Findings: Moderate motion degraded examination. CBF (<30%) Volume: 32m Perfusion (Tmax>6.0s) volume: 851mMismatch Volume: 7317mnfarction Location:RIGHT frontotemporal parietal penumbra with RIGHT basal ganglia, RIGHT periventricular and RIGHT temporal core infarcts. IMPRESSION: CTA NECK: 1. Occluded RIGHT ICA with trace reconstitution. 2. Critical stenosis LEFT ICA origin. 3. Severe stenosis bilateral vertebral artery origins, patent vertebral arteries. CTA HEAD: 1. Occluded RIGHT ICA with reconstitution at carotid terminus. Emergent RIGHT M1 occlusion with intermediate collaterals by single-phase CTA. CT PERFUSION: 1. Motion degraded examination. Large area RIGHT MCA penumbra with 9 cc RIGHT basal ganglia and possible temporal lobe core infarcts. 2. Moderate stenoses LEFT ACA. Critical Value/emergent results text paged to Dr.MC[facility]IRS[facility] AMION secure system on 04/04/2018 at 10:08 pm, including interpreting physician's phone number. Acute findings discussed with and reconfirmed by Dr.MCNEILL [facility]IRS[facility] 04/04/2018 at 10:20 pm. Aortic Atherosclerosis (ICD10-I70.0). Electronically Signed   By: CouElon AlasD.   On: 04/04/2018 22:21   Ct Head Code Stroke Wo Contrast  Result Date: 04/04/2018 CLINICAL DATA:  Code stroke. Last seen normal 1530 hours. LEFT facial droop. LEFT extremity weakness. EXAM: CT HEAD WITHOUT CONTRAST TECHNIQUE: Contiguous axial images were obtained from the base of the skull through the vertex without intravenous contrast. COMPARISON:   None. FINDINGS: Brain: Mild to moderate atrophy, not unexpected for age. Ventriculomegaly, hydrocephalus ex vacuo. Hypoattenuation of white matter, both focal and confluent, likely chronic microvascular ischemic change, but no clear areas of cortical hypodensity. No hemorrhage, mass lesion, or extra-axial fluid. Vascular: Calcification of the cavernous internal carotid arteries and distal LEFT vertebral artery consistent with cerebrovascular atherosclerotic disease. Asymmetric hyperattenuation RIGHT MCA in the sylvian fissure, could reflect acute thrombus in one or both M2 MCA branches. Skull: Normal. Negative for fracture or focal lesion. Sinuses/Orbits: No acute finding. Other: None. ASPECTS ([facility] Early CT Score) - Ganglionic level infarction (caudate, lentiform nuclei, internal capsule, insula, M1-M3 cortex): 7 - Supraganglionic infarction (M4-M6 cortex): 3 Total score (0-10 with 10 being normal): 10 IMPRESSION: 1. Asymmetric hyperattenuation RIGHT MCA in the Sylvian fissure could reflect acute thrombus in one or both M2 branches. 2. Atrophy and small vessel disease. 3. ASPECTS is 10. These results were communicated to Dr. KirLeonel Ramsay 9:46 pmon 4/1/2020by text page via the [facility] Hospitalssaging system. * Electronically Signed   By: JohStaci RighterD.   On: 04/04/2018 21:49       Medical Problem List and Plan: 1.  Left side weakness with dysphagia secondary to right MCA infarction in the setting of  right ICA and right MCA occlusion status post unsuccessful attempts at thrombectomy.  Admit to CIR 2.  Antithrombotics: -DVT/anticoagulation:  SCDs  -antiplatelet therapy: aspirin 81 mg daily, Plavix 75 mg daily 3. Pain Management:  Tylenol 3 for headache as needed 4. Mood: Wellbutrin 300 mg daily,Lexapro 5 mg daily at bedtime  -antipsychotic agents: N/A 5. Neuropsych: This patient is not fully capable of making decisions on her own behalf. 6. Skin/Wound Care:  Routine skin checks 7.  Fluids/Electrolytes/Nutrition:  Routine in and out's with follow-up chemistries in the a.m. 8. Dysphagia. Dysphagia #1 nectar liquids. Follow-up speech therapy  Advance diet as tolerated 9. Hypertension. Norvasc 5 mg daily,lisinopril 40 mg daily, Coreg 12.5 mg twice a day. Monitor with increased mobility 10. Diabetes mellitus. Hemoglobin A1c 8.0. Glucotrol XL 2.5 mg daily, Lantus insulin 12 units daily at bedtime. Check blood sugars before meals and at bedtime.  Monitor with increased mobility 11. Hyperlipidemia. Lipitor 12. Hypothyroidism. Continue Synthroid 13.  Acute blood loss anemia:   Hemoglobin 9.3 on 4/4 follow-up CBC in a.m. 14.  CKD stage III:  Creatinine 1.11 on 4/4.  Follow-up BMP  The patient's status has not changed. The original post admission physician evaluation remains appropriate, and any changes from the pre-admission screening or documentation from the acute chart are noted above.   Delice Lesch, MD, ABPMR Lavon Paganini Angiulli, PA-C 04/06/2018

## 2018-04-08 ENCOUNTER — Inpatient Hospital Stay (HOSPITAL_COMMUNITY): Payer: Medicare Other

## 2018-04-08 DIAGNOSIS — I10 Essential (primary) hypertension: Secondary | ICD-10-CM

## 2018-04-08 DIAGNOSIS — I63511 Cerebral infarction due to unspecified occlusion or stenosis of right middle cerebral artery: Secondary | ICD-10-CM

## 2018-04-08 DIAGNOSIS — D62 Acute posthemorrhagic anemia: Secondary | ICD-10-CM

## 2018-04-08 DIAGNOSIS — N183 Chronic kidney disease, stage 3 (moderate): Secondary | ICD-10-CM

## 2018-04-08 DIAGNOSIS — I69391 Dysphagia following cerebral infarction: Secondary | ICD-10-CM

## 2018-04-08 DIAGNOSIS — E119 Type 2 diabetes mellitus without complications: Secondary | ICD-10-CM

## 2018-04-08 LAB — GLUCOSE, CAPILLARY
Glucose-Capillary: 151 mg/dL — ABNORMAL HIGH (ref 70–99)
Glucose-Capillary: 144 mg/dL — ABNORMAL HIGH (ref 70–99)
Glucose-Capillary: 308 mg/dL — ABNORMAL HIGH (ref 70–99)
Glucose-Capillary: 104 mg/dL — ABNORMAL HIGH (ref 70–99)

## 2018-04-08 MED ORDER — SENNOSIDES-DOCUSATE SODIUM 8.6-50 MG PO TABS
2.0000 | ORAL_TABLET | Freq: Every day | ORAL | Status: DC
  Administered 2018-04-10 – 2018-04-28 (×19): 2 via ORAL
  Filled 2018-04-08 (×19): qty 2

## 2018-04-08 MED ORDER — SORBITOL 70 % SOLN
30.0000 mL | Freq: Every day | Status: DC | PRN
  Administered 2018-04-09: 06:00:00 30 mL via ORAL
  Filled 2018-04-08: qty 30

## 2018-04-08 NOTE — Evaluation (Signed)
Physical Therapy Assessment and Plan  Patient Details  Name: Jessica Meyer MRN: [id number] Date of Birth: 07-Mar-1935  PT Diagnosis: Abnormal posture, Difficulty walking, Hemiparesis non-dominant, Hypertonia and Impaired cognition Rehab Potential: Good ELOS: 4 weeks   Today's Date: 04/08/2018 PT Individual Time: 0998-3382 and [id number] PT Individual Time Calculation (min): 72 min and 54 minutes    Problem List:  Patient Active Problem List   Diagnosis Date Noted  . Right middle cerebral artery stroke ([place]) 04/07/2018  . Left-sided weakness   . CKD (chronic kidney disease), stage III ([place])   . Acute blood loss anemia   . Hypothyroidism   . Dyslipidemia   . Diabetes mellitus type 2 in nonobese (HCC)   . Dysphagia, post-stroke   . Dysarthria, post-stroke   . Middle cerebral artery embolism, right 04/05/2018  . Stroke (cerebrum) ([place]) 04/04/2018  . Atypical chest pain 11/10/2017  . CKD stage G3b/A1, GFR 30-44 and albumin creatinine ratio <30 mg/g (HCC) 08/10/2016  . Left hip pain 06/14/2016  . Type 2 diabetes mellitus with stage 3 chronic kidney disease, with long-term current use of insulin ([place]) 01/29/2016  . Leukocytosis 01/25/2016  . Closed fracture of right hip ([place])   . Fall   . Left knee pain 05/28/2015  . Sprain of hand, third finger, right 05/28/2015  . HTN (hypertension) 07/29/2014  . Gait instability 07/29/2014  . Hyperlipidemia 07/29/2014  . Right knee pain 04/11/2014  . ASNHL (asymmetrical sensorineural hearing loss) 08/07/2013  . 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 Hernia, rectovaginal 08/07/2013  . Hypercholesterolemia without hypertriglyceridemia 08/07/2013  . Arthritis of knee, degenerative 08/07/2013  . Degenerative arthritis of lumbar spine 08/07/2013  . Osteopenia 08/07/2013  . Adult hypothyroidism 08/07/2013  . History of artificial joint 08/07/2013  . Essential (primary) hypertension 08/07/2013  . Depression with anxiety 08/07/2013    Past Medical  History:  Past Medical History:  Diagnosis Date  . Arthritis   . Diabetes mellitus without complication ([place])   . History of chicken pox   . Hyperlipidemia   . Hypertension   . Thyroid disease    hypothyroid   Past Surgical History:  Past Surgical History:  Procedure Laterality Date  . ABDOMINAL HYSTERECTOMY    . HIP ARTHROPLASTY Right 01/26/2016   Procedure: ARTHROPLASTY BIPOLAR HIP (HEMIARTHROPLASTY);  Surgeon: Renette Butters, MD;  Location: [facility];  Service: Orthopedics;  Laterality: Right;  . KNEE ARTHROSCOPY Bilateral   . RADIOLOGY WITH ANESTHESIA N/A 04/04/2018   Procedure: RADIOLOGY WITH ANESTHESIA;  Surgeon: Radiologist, Medication, MD;  Location: [facility];  Service: Radiology;  Laterality: N/A;  . REPLACEMENT TOTAL KNEE BILATERAL     1998 left , 1992 right    Assessment & Plan Clinical Impression: Patient is a 83 y.o. year old female with history of hypertension, hyperlipidemia, diabetes mellitus, bilateral total knee replacement. Per chart review and family, patient lives alone. Independent prior to admission. One level home with 3 steps to entry. She performed her own ADLs as well as housework and cooking. She does not drive. Presented 04/04/2018 with fall and left-sided weakness. No loss of consciousness. CT of the head showed asymmetric hyperattenuation right MCA in the sylvian fissure. CT angiogram of head and neck showed occluded right ICA with trace reconstitution. Critical stenosis left ICA origin. Severe stenosis of bilateral vertebral artery origins, patent vertebral arteries. CT perfusion scan showed a large area right MCA penumbra with 9 mL right basal ganglia and possible temporal lobe cortical infarction. MRI brain reviewed, showing  bilateral infarcts, >moderate to large right MCA infarct.  Per report, right MCA infarction with additional punctate acute infarctions cerebellum bilaterally. Attempts at mechanical thrombectomy unsuccessful. Echocardiogram with ejection fraction  of 65 % hyperdynamic systolic function.No source of embolus. Neurology follow-up maintained on aspirin 81 mg daily and Plavix 3 months and Plavix alone.  Hospital complicated by dysphagia #1 nectar thick liquid. Therapy evaluation completed with recommendations of physical medicine rehabilitation consult. Patient was admitted for a comprehensive rehabilitation program.  Please see preadmission assessment from today as well.  Patient transferred to CIR on 04/07/2018 .   Patient currently requires total with mobility secondary to muscle weakness, abnormal tone and decreased coordination, decreased midline orientation and decreased attention to left, decreased awareness and decreased sitting balance, decreased standing balance, decreased postural control and hemiplegia.  Prior to hospitalization, patient was independent  with mobility and lived with Alone in a House home.  Home access is 3Stairs to enter.  Patient will benefit from skilled PT intervention to maximize safe functional mobility, minimize fall risk and decrease caregiver burden for planned discharge home with 24 hour assist.  Anticipate patient will benefit from follow up [facility] at discharge.  PT - End of Session Activity Tolerance: Tolerates 30+ min activity with multiple rests Endurance Deficit: Yes PT Assessment Rehab Potential (ACUTE/IP ONLY): Good PT Barriers to Discharge: Inaccessible home environment;Decreased caregiver support PT Patient demonstrates impairments in the following area(s): Balance;Behavior;Endurance;Motor;Pain;Safety;Sensory;Perception;Skin Integrity PT Transfers Functional Problem(s): Bed Mobility;Bed to Chair;Car;Furniture;Floor PT Locomotion Functional Problem(s): Ambulation;Wheelchair Mobility;Stairs PT Plan PT Intensity: Minimum of 1-2 x/day ,45 to 90 minutes PT Frequency: 5 out of 7 days PT Duration Estimated Length of Stay: 4 weeks PT Treatment/Interventions: Ambulation/gait training;Balance/vestibular  training;Cognitive remediation/compensation;Discharge planning;Community reintegration;Functional mobility training;Skin care/wound management;UE/LE Strength taining/ROM;Neuromuscular re-education;Splinting/orthotics;UE/LE Coordination activities;Psychosocial support;Therapeutic Exercise;Functional electrical stimulation;DME/adaptive equipment instruction;Patient/family education;Therapeutic Activities;Wheelchair propulsion/positioning;Visual/perceptual remediation/compensation;Stair training;Pain management;Disease management/prevention PT Transfers Anticipated Outcome(s): min assist PT Locomotion Anticipated Outcome(s): mod assist PT Recommendation Recommendations for Other Services: Neuropsych consult Follow Up Recommendations: Home health PT Patient destination: Home Equipment Recommended: To be determined  Skilled Therapeutic Intervention Session 1: Evaluation completed (see details above and below) with education on PT POC and goals and individual treatment initiated with focus on functional mobility, sitting balance, awareness and tone management. Pt supine in bed upon PT arrival, agreeable to therapy tx and denies pain at rest. Pt transferred to sitting EOB with max assist +2, pt with truncal extensor tone noted leading to posterior LOB. Pt requires max assist for static sitting balance with poor awareness of deficits/lateral/posterior lean. Pt performed sit<>stand within stedy Max assist +2, in standing pt requires max assist for standing balance with strong pushing to the L and L lateral lean. Pt transferred to w/c with stedy, dependent +2. Pt transported to the gym. Pt provided with TIS w/c this session secondary to poor postural control. Pt transferred from w/c>mat squat pivot total assist +2. Pt worked on sitting balance this session with mirror for visual feedback, max cues and manual facilitation to achieve midline, sitting balance from min-max assist. Pt transferred from mat>TIS with total  assist +2, pt reports being incontinent of bladder. Pt transported back to room, total +2 squat pivot to bed. Pt transferred to supine max assist +2. Pt performed rolling with mod assist while changing brief and performing peri care total assist. Pt left supine in bed with needs in reach and bed alarm set. Therapist adjusted w/c head rest for improved positioning when OOB.   Session 2: Pt supine in  bed upon PT arrival, agreeable to therapy tx and denies pain at rest. Pt transferred to sitting EOB with max assist +2. Seated EOB worked on midline orientation and seated balance with cues from therapist, pt able to maintain static sitting balance briefly for 3-5 sec with supervision, otherwise needing min-max assist. Pt performed squat pivot transfer to the w/c with total assist +2. Pt transported to the gym. Pt used standing frame this session, dependent sit<>stand with standing frame x 10 minutes. In standing pt worked on midline orientation with use of mirror for visual feedback, therapist provided manual facilitation for hip/trunk extension, also performing standing for L LE weightbearing/tone management. Pt able to get L foot flat in standing with use of standing frame this session. Pt then worked on seated balance while in w/c but with arm rests removed, verbal/visual/tactile cues for midline orientation, postural awareness and anterior weightshift while performing dynamic and static balance tasks, min-mod assist. Pt left in w/c at end of session with needs in reach and chair alarm set, pt set up to facetime her daughter and therapist updated daughter on pt's therapy sessions/POC/goals today.    PT Evaluation Precautions/Restrictions Precautions Precautions: Fall Precaution Comments: left inattention and right gaze, increased extensor tone LLE and Flexion tone LUE at shoulder and elbow Restrictions Weight Bearing Restrictions: No General   Vital SignsTherapy Vitals Pulse Rate: 63 BP:  129/65 Patient Position (if appropriate): Lying Pain Pain Assessment Pain Scale: 0-10 Pain Score: 8  Pain Type: Acute pain Pain Location: Head Pain Orientation: Left Pain Descriptors / Indicators: Headache Pain Frequency: Intermittent Pain Onset: On-going Patients Stated Pain Goal: 4 Pain Intervention(s): Medication (See eMAR) Home Living/Prior Functioning Home Living Available Help at Discharge: Family Type of Home: House Home Access: Stairs to enter Technical brewer of Steps: 3 Entrance Stairs-Rails: Right Home Layout: One level  Lives With: Alone Prior Function Level of Independence: Independent with basic ADLs;Independent with homemaking with ambulation  Able to Take Stairs?: Yes Driving: No Comments: lives alone performs ADLs, housework, cooking and puzzles, doesn't drive Cognition Overall Cognitive Status: Impaired/Different from baseline Arousal/Alertness: Lethargic Orientation Level: Oriented to situation;Oriented to place;Oriented to person Attention: Sustained Sustained Attention: Impaired Sustained Attention Impairment: Verbal basic Memory: Impaired Awareness: Impaired Awareness Impairment: Emergent impairment;Intellectual impairment Safety/Judgment: Impaired Sensation Sensation Light Touch: Impaired by gross assessment Proprioception: Impaired by gross assessment Additional Comments: pt demonstrates impaired sensation on L side Coordination Gross Motor Movements are Fluid and Coordinated: No Fine Motor Movements are Fluid and Coordinated: No Coordination and Movement Description: coordination impaired secondary to hemiplegia and hypertonia on L side Motor  Motor Motor: Hemiplegia;Abnormal tone;Abnormal postural alignment and control  Mobility Bed Mobility Bed Mobility: Rolling Right;Rolling Left;Supine to Sit;Sit to Supine Rolling Right: 2 Helpers Rolling Left: 2 Helpers Supine to Sit: 2 Helpers Sit to Supine: 2 Helpers Transfers Transfers:  Corporate treasurer Transfers: 2 Helpers Transfer (Assistive device): None Locomotion  Gait Ambulation: No Gait Gait: No Stairs / Additional Locomotion Stairs: No Wheelchair Mobility Wheelchair Mobility: Yes Wheelchair Assistance: Dependent - Patient 0%(Tilt in space w/c) Wheelchair Parts Management: Needs assistance  Trunk/Postural Assessment  Cervical Assessment Cervical Assessment: Exceptions to [facility] Thoracic Assessment Thoracic Assessment: Exceptions to [facility] Lumbar Assessment Lumbar Assessment: Exceptions to [facility] Postural Control Postural Control: Deficits on evaluation Trunk Control: impaired, pt with L lateral lean/pushing, increased trunk extensor tone Righting Reactions: impaired  Balance Balance Balance Assessed: Yes Static Sitting Balance Static Sitting - Level of Assistance: 2: Max assist Dynamic Sitting Balance  Dynamic Sitting - Level of Assistance: 2: Max assist Static Standing Balance Static Standing - Level of Assistance: 1: +2 Total assist(within the stedy) Extremity Assessment  RLE Assessment RLE Assessment: Within Functional Limits LLE Assessment LLE Assessment: Exceptions to [facility] Passive Range of Motion (PROM) Comments: limited DF, unable to come to neutral passively Active Range of Motion (AROM) Comments: impaired secondary to weakness and tone General Strength Comments: unable to formally assess secondary to extensor tone LLE Tone LLE Tone Comments: significant L LE extensor tone    Refer to Care Plan for Long Term Goals  Recommendations for other services: Neuropsych Discharge Criteria: Patient will be discharged from PT if patient refuses treatment 3 consecutive times without medical reason, if treatment goals not met, if there is a change in medical status, if patient makes no progress towards goals or if patient is discharged from hospital.  The above assessment, treatment plan, treatment alternatives and goals were discussed and  mutually agreed upon: by patient  Netta Corrigan, PT, DPT 04/08/2018, 9:18 AM

## 2018-04-08 NOTE — Progress Notes (Signed)
Very restless first few hours of shift-yelling, cursing,  banging on Side rails. No attempts OOB. Very HOH even with hearing aides in place. PRN tylenol given X 2 this shift for HA and LLE pain. Coughs regularly with nectar H2O, physically needing to hold cup to slow patient down. Incontinent of urine. Unsure of LBM, BS (+) and abd soft. Right groin incision OTA. Tone to LUE and LLE. Behavior more appropriate this AM. Martina Sinner

## 2018-04-08 NOTE — Progress Notes (Signed)
[facility] PHYSICAL MEDICINE & REHABILITATION PROGRESS NOTE  Subjective/Complaints: Patient seen laying in bed this morning.  She slept well overnight.  She is still sleepy this morning.  Per nursing patient with constipation.  Patient also with pain in her left lower extremity yesterday improved with gabapentin.  ROS: Denies CP, shortness of breath, nausea, vomiting, diarrhea.  Objective: Vital Signs: Blood pressure 129/65, pulse 63, temperature 99 F (37.2 C), temperature source Oral, resp. rate 16, height 5\' 5"  (1.651 m), weight 66.2 kg, SpO2 97 %. No results found. Recent Labs    04/07/18 0232  WBC 9.5  HGB 9.3*  HCT 27.7*  PLT 197   Recent Labs    04/07/18 0232  NA 137  K 3.7  CL 109  CO2 21*  GLUCOSE 129*  BUN 15  CREATININE 1.11*  CALCIUM 9.3    Physical Exam: BP 129/65 (BP Location: Right Arm)   Pulse 63   Temp 99 F (37.2 C) (Oral)   Resp 16   Ht 5\' 5"  (1.651 m)   Wt 66.2 kg   SpO2 97%   BMI 24.29 kg/m  Constitutional: Well-developed.  Well-nourished.  NAD.  Vital signs reviewed. HENT: Normocephalic.  Atraumatic. Eyes: EOMI.  No discharge. Cardiovascular: No JVD. Respiratory: Effort normal  GI: Nondistended Musculoskeletal: No edema or tenderness in extremities  Neurological: She is somnolent this a.m.  Left-sided neglect  Right gaze preference Follows commands.  Fair insight to deficits. Dysarthria Motor: LLE: 0/5 proximal to distal RLE: 5/5 proximal to distal Sensation absent to light touch LLE  Skin: Skin is warm and dry.  Psychiatric: Her affect is blunt. Her speech is delayed.   Assessment/Plan: 1. Functional deficits secondary to right MCA infarct which require 3+ hours per day of interdisciplinary therapy in a comprehensive inpatient rehab setting.  Physiatrist is providing close team supervision and 24 hour management of active medical problems listed below.  Physiatrist and rehab team continue to assess barriers to  discharge/monitor patient progress toward functional and medical goals  Care Tool:  Bathing              Bathing assist       Upper Body Dressing/Undressing Upper body dressing   What is the patient wearing?: Hospital gown only    Upper body assist Assist Level: Total Assistance - Patient < 25%    Lower Body Dressing/Undressing Lower body dressing      What is the patient wearing?: Incontinence brief     Lower body assist Assist for lower body dressing: Total Assistance - Patient < 25%     Toileting Toileting    Toileting assist Assist for toileting: Total Assistance - Patient < 25%     Transfers Chair/bed transfer  Transfers assist  Chair/bed transfer activity did not occur: Safety/medical concerns        Locomotion Ambulation   Ambulation assist              Walk 10 feet activity   Assist           Walk 50 feet activity   Assist           Walk 150 feet activity   Assist           Walk 10 feet on uneven surface  activity   Assist           Wheelchair     Assist               Wheelchair  50 feet with 2 turns activity    Assist            Wheelchair 150 feet activity     Assist            Medical Problem List and Plan: 1.  Left side weakness with dysphagia secondary to right MCA infarction in the setting of right ICA and right MCA occlusion status post unsuccessful attempts at thrombectomy.  Begin CIR 2.  Antithrombotics: -DVT/anticoagulation:  SCDs             -antiplatelet therapy: aspirin 81 mg daily, Plavix 75 mg daily 3. Pain Management:  Tylenol 3 for headache as needed 4. Mood: Wellbutrin 300 mg daily,Lexapro 5 mg daily at bedtime             -antipsychotic agents: N/A 5. Neuropsych: This patient is not fully capable of making decisions on her own behalf. 6. Skin/Wound Care:  Routine skin checks 7. Fluids/Electrolytes/Nutrition:  Routine in and out's with follow-up  chemistries tomorrow. 8. Dysphagia. Dysphagia #1 nectar liquids. Follow-up speech therapy             Advance diet as tolerated 9. Hypertension. Norvasc 5 mg daily,lisinopril 40 mg daily, Coreg 12.5 mg twice a day.   Monitor with increased mobility 10. Diabetes mellitus. Hemoglobin A1c 8.0. Glucotrol XL 2.5 mg daily, Lantus insulin 12 units daily at bedtime. Check blood sugars before meals and at bedtime.     Monitor with increased mobility  11. Hyperlipidemia. Lipitor 12. Hypothyroidism. Continue Synthroid 13.  Acute blood loss anemia:              Hemoglobin 9.3 on 4/4 follow-up tomorrow 14.  CKD stage III:             Creatinine 1.11 on 4/4.  Follow-up BMP tomorrow  LOS: 1 days A FACE TO FACE EVALUATION WAS PERFORMED  Tavaris Eudy Karis Juba 04/08/2018, 8:09 AM

## 2018-04-08 NOTE — Evaluation (Signed)
Occupational Therapy Assessment and Plan  Patient Details  Name: Jessica Meyer MRN: [id number] Date of Birth: August 29, 1935  OT Diagnosis: abnormal posture, hemiplegia affecting non-dominant side and muscle weakness (generalized) Rehab Potential: Rehab Potential (ACUTE ONLY): Fair ELOS: 4 weeks   Today's Date: 04/08/2018 OT Individual Time: 1102-1208 OT Individual Time Calculation (min): 66 min   Problem List:  Patient Active Problem List   Diagnosis Date Noted  . Right middle cerebral artery stroke ([place]) 04/07/2018  . Left-sided weakness   . CKD (chronic kidney disease), stage III ([place])   . Acute blood loss anemia   . Hypothyroidism   . Dyslipidemia   . Diabetes mellitus type 2 in nonobese (HCC)   . Dysphagia, post-stroke   . Dysarthria, post-stroke   . Middle cerebral artery embolism, right 04/05/2018  . Stroke (cerebrum) ([place]) 04/04/2018  . Atypical chest pain 11/10/2017  . CKD stage G3b/A1, GFR 30-44 and albumin creatinine ratio <30 mg/g (HCC) 08/10/2016  . Left hip pain 06/14/2016  . Type 2 diabetes mellitus with stage 3 chronic kidney disease, with long-term current use of insulin ([place]) 01/29/2016  . Leukocytosis 01/25/2016  . Closed fracture of right hip ([place])   . Fall   . Left knee pain 05/28/2015  . Sprain of hand, third finger, right 05/28/2015  . HTN (hypertension) 07/29/2014  . Gait instability 07/29/2014  . Hyperlipidemia 07/29/2014  . Right knee pain 04/11/2014  . ASNHL (asymmetrical sensorineural hearing loss) 08/07/2013  . 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 Hernia, rectovaginal 08/07/2013  . Hypercholesterolemia without hypertriglyceridemia 08/07/2013  . Arthritis of knee, degenerative 08/07/2013  . Degenerative arthritis of lumbar spine 08/07/2013  . Osteopenia 08/07/2013  . Adult hypothyroidism 08/07/2013  . History of artificial joint 08/07/2013  . Essential (primary) hypertension 08/07/2013  . Depression with anxiety 08/07/2013    Past Medical History:   Past Medical History:  Diagnosis Date  . Arthritis   . Diabetes mellitus without complication ([place])   . History of chicken pox   . Hyperlipidemia   . Hypertension   . Thyroid disease    hypothyroid   Past Surgical History:  Past Surgical History:  Procedure Laterality Date  . ABDOMINAL HYSTERECTOMY    . HIP ARTHROPLASTY Right 01/26/2016   Procedure: ARTHROPLASTY BIPOLAR HIP (HEMIARTHROPLASTY);  Surgeon: Renette Butters, MD;  Location: [facility];  Service: Orthopedics;  Laterality: Right;  . KNEE ARTHROSCOPY Bilateral   . RADIOLOGY WITH ANESTHESIA N/A 04/04/2018   Procedure: RADIOLOGY WITH ANESTHESIA;  Surgeon: Radiologist, Medication, MD;  Location: [facility];  Service: Radiology;  Laterality: N/A;  . REPLACEMENT TOTAL KNEE BILATERAL     1998 left , 1992 right    Assessment & Plan Clinical Impression: Vadie Principato is an 83 year old right handed female history of hypertension, hyperlipidemia, diabetes mellitus, bilateral total knee replacement. Per chart review and family, patient lives alone. Independent prior to admission. One level home with 3 steps to entry. She performed her own ADLs as well as housework and cooking. She does not drive. Presented 04/04/2018 with fall and left-sided weakness. No loss of consciousness. CT of the head showed asymmetric hyperattenuation right MCA in the sylvian fissure. CT angiogram of head and neck showed occluded right ICA with trace reconstitution. Critical stenosis left ICA origin. Severe stenosis of bilateral vertebral artery origins, patent vertebral arteries. CT perfusion scan showed a large area right MCA penumbra with 9 mL right basal ganglia and possible temporal lobe cortical infarction. MRI brain reviewed, showing bilateral infarcts, >  moderate to large right MCA infarct.  Per report, right MCA infarction with additional punctate acute infarctions cerebellum bilaterally. Attempts at mechanical thrombectomy unsuccessful. Echocardiogram with ejection  fraction of 65 % hyperdynamic systolic function.No source of embolus. Neurology follow-up maintained on aspirin 81 mg daily and Plavix 3 months and Plavix alone.  Hospital complicated by dysphagia #1 nectar thick liquid. Therapy evaluation completed with recommendations of physical medicine rehabilitation consult. Patient was admitted for a comprehensive rehabilitation program.  Please see preadmission assessment from today as well.Patient transferred to CIR on 04/07/2018 .    Patient currently requires max with basic self-care skills secondary to muscle weakness and muscle joint tightness, impaired timing and sequencing, abnormal tone, unbalanced muscle activation, decreased coordination and decreased motor planning, decreased midline orientation, decreased attention to left and left side neglect, decreased initiation, decreased awareness, decreased problem solving and decreased safety awareness and decreased sitting balance, decreased standing balance, decreased postural control, hemiplegia, decreased balance strategies and difficulty maintaining precautions.  Prior to hospitalization, patient could complete ADLs with independent .  Patient will benefit from skilled intervention to decrease level of assist with basic self-care skills and increase level of independence with iADL prior to discharge home with care partner.  Anticipate patient will require minimal physical assistance and follow up home health.  OT - End of Session Activity Tolerance: Tolerates 10 - 20 min activity with multiple rests Endurance Deficit: Yes Endurance Deficit Description: generalized weakness OT Assessment Rehab Potential (ACUTE ONLY): Fair OT Patient demonstrates impairments in the following area(s): Balance;Vision;Pain;Cognition;Perception;Safety;Endurance;Sensory;Motor OT Basic ADL's Functional Problem(s): Eating;Grooming;Bathing;Dressing;Toileting OT Transfers Functional Problem(s): Toilet;Tub/Shower OT Additional  Impairment(s): Fuctional Use of Upper Extremity OT Plan OT Intensity: Minimum of 1-2 x/day, 45 to 90 minutes OT Frequency: 5 out of 7 days OT Duration/Estimated Length of Stay: 4 weeks OT Treatment/Interventions: Balance/vestibular training;Community reintegration;Disease mangement/prevention;Functional electrical stimulation;Neuromuscular re-education;Patient/family education;Self Care/advanced ADL retraining;Splinting/orthotics;Therapeutic Exercise;UE/LE Coordination activities;Wheelchair propulsion/positioning;Visual/perceptual remediation/compensation;Therapeutic Activities;Skin care/wound managment;Psychosocial support;Functional mobility training;Discharge planning;Cognitive remediation/compensation;DME/adaptive equipment instruction;Pain management;UE/LE Strength taining/ROM OT Self Feeding Anticipated Outcome(s): set up  OT Basic Self-Care Anticipated Outcome(s): min A OT Toileting Anticipated Outcome(s): min A OT Bathroom Transfers Anticipated Outcome(s): min A OT Recommendation Recommendations for Other Services: Speech consult Patient destination: Home Follow Up Recommendations: Home health OT Equipment Recommended: To be determined   Skilled Therapeutic Intervention Skilled OT evaluation completed. Pt pleasant and cooperative throughout session. Pt was edu (and then later on, family as well via facetime) re OT POC, ELOS, current condition insight, and rehab expectations. Pt completed ADLs at bed level as described below. Pt required MAX cueing to maintain midline orientation when sitting EOB, as well as max A. Pt unable to track past midline toward L side and presenting with L inattention overall. Pt had severe extensor tone in her L LE and flexor tone in her L UE with no active movement palpated or observed in L UE. Pt left supine in bed following session with report given to LPN Kayla re anticipated feeding needs and transfers.   OT Evaluation Precautions/Restrictions   Precautions Precautions: Fall Precaution Comments: left inattention and right gaze, increased extensor tone LLE and Flexion tone LUE at shoulder and elbow Restrictions Weight Bearing Restrictions: No General Chart Reviewed: Yes Family/Caregiver Present: No Pain Pain Assessment Pain Scale: 0-10 Pain Score: 0-No pain Faces Pain Scale: Hurts a little bit Pain Type: Acute pain Pain Location: Head Pain Descriptors / Indicators: Headache Pain Frequency: Intermittent Pain Onset: On-going Patients Stated Pain Goal: 2 Pain Intervention(s): Medication (See eMAR) Home  Living/Prior Functioning Home Living Family/patient expects to be discharged to:: (S) Other (Comment)(pt lives alone) Available Help at Discharge: Family Type of Home: House Home Access: Stairs to enter Technical brewer of Steps: 3 Entrance Stairs-Rails: Right Home Layout: One level Bathroom Shower/Tub: Chiropodist: Standard  Lives With: Alone IADL History Homemaking Responsibilities: Yes Meal Prep Responsibility: Primary Laundry Responsibility: Primary Cleaning Responsibility: Primary Bill Paying/Finance Responsibility: Primary Shopping Responsibility: Primary Occupation: Retired Prior Function Level of Independence: Independent with basic ADLs, Independent with homemaking with ambulation  Able to Take Stairs?: Yes Driving: No Comments: lives alone performs ADLs, housework, cooking and puzzles, doesn't drive ADL ADL Eating: Maximal assistance Where Assessed-Eating: Bed level Grooming: Moderate assistance Where Assessed-Grooming: Bed level Upper Body Bathing: Maximal assistance Where Assessed-Upper Body Bathing: Bed level Lower Body Bathing: Maximal assistance Where Assessed-Lower Body Bathing: Bed level Upper Body Dressing: Maximal assistance Where Assessed-Upper Body Dressing: Bed level Lower Body Dressing: Dependent Where Assessed-Lower Body Dressing: Bed level Toileting:  Dependent Where Assessed-Toileting: Bed level Toilet Transfer: Unable to assess Vision Baseline Vision/History: Wears glasses Wears Glasses: At all times Patient Visual Report: No change from baseline Vision Assessment?: Yes Eye Alignment: Within Functional Limits Ocular Range of Motion: Restricted on the left Alignment/Gaze Preference: Gaze right Tracking/Visual Pursuits: Impaired - to be further tested in functional context;Unable to hold eye position out of midline;Decreased smoothness of horizontal tracking;Decreased smoothness of vertical tracking;Left eye does not track laterally Saccades: Impaired - to be further tested in functional context Visual Fields: Left visual field deficit Depth Perception: Overshoots Perception  Perception: Impaired Inattention/Neglect: Does not attend to left visual field;Does not attend to left side of body Praxis Praxis: Impaired Praxis Impairment Details: Initiation;Motor planning;Perseveration Cognition Overall Cognitive Status: Impaired/Different from baseline Arousal/Alertness: Awake/alert Orientation Level: Person;Place;Situation Person: Oriented Place: Oriented Situation: Oriented Year: 2020 Month: April Day of Week: Correct Memory: Impaired Memory Impairment: Decreased short term memory Immediate Memory Recall: Sock;Blue;Bed Memory Recall: Sock;Blue;Bed Memory Recall Sock: With Cue Memory Recall Blue: Without Cue Memory Recall Bed: Without Cue Attention: Sustained Sustained Attention: Impaired Sustained Attention Impairment: Verbal basic;Functional basic Awareness: Impaired Awareness Impairment: Emergent impairment Problem Solving: Impaired Problem Solving Impairment: Verbal basic;Functional basic Executive Function: Sequencing;Decision Making;Initiating;Self Monitoring;Self Correcting Sequencing: Impaired Sequencing Impairment: Verbal basic;Functional basic Decision Making: Impaired Decision Making Impairment: Verbal  basic;Functional basic Initiating: Impaired Initiating Impairment: Functional basic Self Monitoring: Impaired Self Monitoring Impairment: Functional basic Self Correcting: Impaired Self Correcting Impairment: Functional basic Safety/Judgment: Impaired Sensation Sensation Light Touch: Impaired by gross assessment Proprioception: Impaired by gross assessment Additional Comments: pt demonstrates impaired sensation on L side Coordination Gross Motor Movements are Fluid and Coordinated: No Fine Motor Movements are Fluid and Coordinated: No Coordination and Movement Description: coordination impaired secondary to hemiplegia and hypertonia on L side Motor  Motor Motor: Hemiplegia;Abnormal tone;Abnormal postural alignment and control Mobility  Bed Mobility Bed Mobility: Rolling Right;Rolling Left;Supine to Sit;Sit to Supine Rolling Right: 2 Helpers Rolling Left: 2 Helpers Supine to Sit: 2 Helpers Sit to Supine: 2 Helpers Transfers Sit to Stand: 2 Helpers Stand to Sit: 2 Helpers  Trunk/Postural Assessment  Cervical Assessment Cervical Assessment: Exceptions to [facility] Thoracic Assessment Thoracic Assessment: Exceptions to [facility] Lumbar Assessment Lumbar Assessment: Exceptions to [facility] Postural Control Postural Control: Deficits on evaluation Trunk Control: impaired, pt with L lateral lean/pushing, increased trunk extensor tone Righting Reactions: impaired  Balance Balance Balance Assessed: Yes Static Sitting Balance Static Sitting - Balance Support: Feet supported Static Sitting - Level of Assistance: 2: Max assist  Dynamic Sitting Balance Dynamic Sitting - Balance Support: Feet supported Dynamic Sitting - Level of Assistance: 2: Max assist Static Standing Balance Static Standing - Balance Support: Bilateral upper extremity supported Static Standing - Level of Assistance: 1: +2 Total assist Extremity/Trunk Assessment RUE Assessment RUE Assessment: Within Functional Limits LUE  Assessment LUE Assessment: Exceptions to [facility] Passive Range of Motion (PROM) Comments: When arm is more flaccid- able to move L UE in all planes without pain or complaint LUE Body System: Neuro Brunstrum levels for arm and hand: Hand;Arm Brunstrum level for arm: Stage I Presynergy Brunstrum level for hand: Stage I Flaccidity LUE Tone LUE Tone: Modified Ashworth Body Part - Modified Ashworth Scale: Elbow Elbow - Modified Ashworth Scale for Grading Hypertonia LUE: Considerable increase in muschle tone, passive movement difficult Modified Ashworth Scale for Grading Hypertonia LUE: Considerable increase in muschle tone, passive movement difficult     Refer to Care Plan for Long Term Goals  Recommendations for other services: None    Discharge Criteria: Patient will be discharged from OT if patient refuses treatment 3 consecutive times without medical reason, if treatment goals not met, if there is a change in medical status, if patient makes no progress towards goals or if patient is discharged from hospital.  The above assessment, treatment plan, treatment alternatives and goals were discussed and mutually agreed upon: by patient and by family  Curtis Sites 04/08/2018, 5:23 PM

## 2018-04-09 ENCOUNTER — Inpatient Hospital Stay (HOSPITAL_COMMUNITY): Payer: Medicare Other | Admitting: Speech Pathology

## 2018-04-09 ENCOUNTER — Inpatient Hospital Stay (HOSPITAL_COMMUNITY): Payer: Medicare Other

## 2018-04-09 ENCOUNTER — Encounter (HOSPITAL_COMMUNITY): Payer: Self-pay | Admitting: Interventional Radiology

## 2018-04-09 ENCOUNTER — Inpatient Hospital Stay (HOSPITAL_COMMUNITY): Payer: Medicare Other | Admitting: Physical Therapy

## 2018-04-09 DIAGNOSIS — D72829 Elevated white blood cell count, unspecified: Secondary | ICD-10-CM

## 2018-04-09 DIAGNOSIS — I639 Cerebral infarction, unspecified: Secondary | ICD-10-CM

## 2018-04-09 DIAGNOSIS — R109 Unspecified abdominal pain: Secondary | ICD-10-CM

## 2018-04-09 DIAGNOSIS — G8114 Spastic hemiplegia affecting left nondominant side: Secondary | ICD-10-CM

## 2018-04-09 LAB — CBC WITH DIFFERENTIAL/PLATELET
Abs Immature Granulocytes: 0.1 10*3/uL — ABNORMAL HIGH (ref 0.00–0.07)
Basophils Absolute: 0 10*3/uL (ref 0.0–0.1)
Basophils Relative: 0 %
Eosinophils Absolute: 0.3 10*3/uL (ref 0.0–0.5)
Eosinophils Relative: 2 %
HCT: 31 % — ABNORMAL LOW (ref 36.0–46.0)
Hemoglobin: 10 g/dL — ABNORMAL LOW (ref 12.0–15.0)
Immature Granulocytes: 1 %
Lymphocytes Relative: 12 %
Lymphs Abs: 1.6 10*3/uL (ref 0.7–4.0)
MCH: 30.3 pg (ref 26.0–34.0)
MCHC: 32.3 g/dL (ref 30.0–36.0)
MCV: 93.9 fL (ref 80.0–100.0)
Monocytes Absolute: 1.2 10*3/uL — ABNORMAL HIGH (ref 0.1–1.0)
Monocytes Relative: 9 %
Neutro Abs: 9.9 10*3/uL — ABNORMAL HIGH (ref 1.7–7.7)
Neutrophils Relative %: 76 %
Platelets: 208 10*3/uL (ref 150–400)
RBC: 3.3 MIL/uL — ABNORMAL LOW (ref 3.87–5.11)
RDW: 13.1 % (ref 11.5–15.5)
WBC: 13.1 10*3/uL — ABNORMAL HIGH (ref 4.0–10.5)
nRBC: 0 % (ref 0.0–0.2)

## 2018-04-09 LAB — COMPREHENSIVE METABOLIC PANEL
ALT: 24 U/L (ref 0–44)
AST: 34 U/L (ref 15–41)
Albumin: 3.4 g/dL — ABNORMAL LOW (ref 3.5–5.0)
Alkaline Phosphatase: 90 U/L (ref 38–126)
Anion gap: 9 (ref 5–15)
BUN: 15 mg/dL (ref 8–23)
CO2: 22 mmol/L (ref 22–32)
Calcium: 9.5 mg/dL (ref 8.9–10.3)
Chloride: 106 mmol/L (ref 98–111)
Creatinine, Ser: 1.22 mg/dL — ABNORMAL HIGH (ref 0.44–1.00)
GFR calc Af Amer: 48 mL/min — ABNORMAL LOW (ref >60)
GFR calc non Af Amer: 42 mL/min — ABNORMAL LOW (ref >60)
Glucose, Bld: 144 mg/dL — ABNORMAL HIGH (ref 70–99)
Potassium: 3.9 mmol/L (ref 3.5–5.1)
Sodium: 137 mmol/L (ref 135–145)
Total Bilirubin: 0.6 mg/dL (ref 0.3–1.2)
Total Protein: 6 g/dL — ABNORMAL LOW (ref 6.5–8.1)

## 2018-04-09 LAB — URINALYSIS, ROUTINE W REFLEX MICROSCOPIC
Bilirubin Urine: NEGATIVE
Glucose, UA: 50 mg/dL — AB
Hgb urine dipstick: NEGATIVE
Ketones, ur: NEGATIVE mg/dL
Leukocytes,Ua: NEGATIVE
Nitrite: NEGATIVE
Protein, ur: NEGATIVE mg/dL
Specific Gravity, Urine: 1.012 (ref 1.005–1.030)
pH: 6 (ref 5.0–8.0)

## 2018-04-09 LAB — GLUCOSE, CAPILLARY
Glucose-Capillary: 252 mg/dL — ABNORMAL HIGH (ref 70–99)
Glucose-Capillary: 124 mg/dL — ABNORMAL HIGH (ref 70–99)
Glucose-Capillary: 224 mg/dL — ABNORMAL HIGH (ref 70–99)
Glucose-Capillary: 177 mg/dL — ABNORMAL HIGH (ref 70–99)

## 2018-04-09 NOTE — Progress Notes (Signed)
Inpatient Rehabilitation  Patient information reviewed and entered into eRehab system by Brecken Dewoody M. Erez Mccallum, M.A., CCC/SLP, PPS Coordinator.  Information including medical coding, functional ability and quality indicators will be reviewed and updated through discharge.    

## 2018-04-09 NOTE — Evaluation (Addendum)
Speech Language Pathology Assessment and Plan  Patient Details  Name: Jessica Meyer MRN: [id number] Date of Birth: [birth year]  SLP Diagnosis: Cognitive Impairments;Dysphagia  Rehab Potential: Fair ELOS: 4 weeks    Today's Date: 04/09/2018   SLP Individual Time: 8657-8469 SLP Individual Time Calculation (min): 10 min   SLP Individual Time: 1345-1400 SLP Individual Time Calculation (min): 15 min   Problem List:  Patient Active Problem List   Diagnosis Date Noted  . Right middle cerebral artery stroke ([place]) 04/07/2018  . Left-sided weakness   . CKD (chronic kidney disease), stage III ([place])   . Acute blood loss anemia   . Hypothyroidism   . Dyslipidemia   . Diabetes mellitus type 2 in nonobese (HCC)   . Dysphagia, post-stroke   . Dysarthria, post-stroke   . Middle cerebral artery embolism, right 04/05/2018  . Stroke (cerebrum) ([place]) 04/04/2018  . Atypical chest pain 11/10/2017  . CKD stage G3b/A1, GFR 30-44 and albumin creatinine ratio <30 mg/g (HCC) 08/10/2016  . Left hip pain 06/14/2016  . Type 2 diabetes mellitus with stage 3 chronic kidney disease, with long-term current use of insulin ([place]) 01/29/2016  . Leukocytosis 01/25/2016  . Closed fracture of right hip ([place])   . Fall   . Left knee pain 05/28/2015  . Sprain of hand, third finger, right 05/28/2015  . HTN (hypertension) 07/29/2014  . Gait instability 07/29/2014  . Hyperlipidemia 07/29/2014  . Right knee pain 04/11/2014  . ASNHL (asymmetrical sensorineural hearing loss) 08/07/2013  . 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 Hernia, rectovaginal 08/07/2013  . Hypercholesterolemia without hypertriglyceridemia 08/07/2013  . Arthritis of knee, degenerative 08/07/2013  . Degenerative arthritis of lumbar spine 08/07/2013  . Osteopenia 08/07/2013  . Adult hypothyroidism 08/07/2013  . History of artificial joint 08/07/2013  . Essential (primary) hypertension 08/07/2013  . Depression with anxiety 08/07/2013   Past Medical  History:  Past Medical History:  Diagnosis Date  . Arthritis   . Diabetes mellitus without complication ([place])   . History of chicken pox   . Hyperlipidemia   . Hypertension   . Thyroid disease    hypothyroid   Past Surgical History:  Past Surgical History:  Procedure Laterality Date  . ABDOMINAL HYSTERECTOMY    . HIP ARTHROPLASTY Right 01/26/2016   Procedure: ARTHROPLASTY BIPOLAR HIP (HEMIARTHROPLASTY);  Surgeon: Renette Butters, MD;  Location: [facility];  Service: Orthopedics;  Laterality: Right;  . IR ANGIO EXTRACRAN SEL COM CAROTID INNOMINATE UNI L MOD SED  04/05/2018  . IR CT HEAD LTD  04/05/2018  . IR PERCUTANEOUS ART THROMBECTOMY/INFUSION INTRACRANIAL INC DIAG ANGIO  04/05/2018  . KNEE ARTHROSCOPY Bilateral   . RADIOLOGY WITH ANESTHESIA N/A 04/04/2018   Procedure: RADIOLOGY WITH ANESTHESIA;  Surgeon: Radiologist, Medication, MD;  Location: [facility];  Service: Radiology;  Laterality: N/A;  . REPLACEMENT TOTAL KNEE BILATERAL     1998 left , 1992 right    Assessment / Plan / Recommendation Clinical Impression Jessica Meyer is an 83 year old right handed female history of hypertension, hyperlipidemia, diabetes mellitus, bilateral total knee replacement. Per chart review and family, patient lives alone. Independent prior to admission. Presented 04/04/2018 with fall and left-sided weakness. No loss of consciousness. CT of the head showed asymmetric hyperattenuation right MCA in the sylvian fissure. CT angiogram of head and neck showed occluded right ICA with trace reconstitution. Critical stenosis left ICA origin. Severe stenosis of bilateral vertebral artery origins, patent vertebral arteries. CT perfusion scan showed a large area right MCA  penumbra with 9 mL right basal ganglia and possible temporal lobe cortical infarction. MRI brain reviewed, showing bilateral infarcts, >moderate to large right MCA infarct.  Per report, right MCA infarction with additional punctate acute infarctions cerebellum  bilaterally. Attempts at mechanical thrombectomy unsuccessful. Echocardiogram with ejection fraction of 65 % hyperdynamic systolic function. No source of embolus. Hospital complicated by dysphagia #1 nectar thick liquid. Therapy evaluation completed with recommendations of physical medicine rehabilitation consult. Patient was admitted for a comprehensive rehabilitation program on 04/07/18.   Comprehensive congitive linguistic and bedside swallow evaluation completed on 04/09/18. Nursing made SLP aware that pt was coughing on every sip of nectar thick liquids at bedside throughout weekend. SLP provided assessment with tsp of honey thick liquids. Pt with decreased coughing over nectar thick liquids but she did cough intermittently. At bdeside her swallow appaeared discoordinated and cough was suggestive of possible incomplete airway protection. MBS scheduled for 1300 this day for instrumental evaluation of swallow. Pt presents with right hemisphere disorder c/b moderate impairments with sustained attention, retrieveal of new information, basic problem solving, disorientation to time, right gaze preference with left inattention and overall impaired awareness and insight.  Pt also presents with mild flaccid dysarthriat d/c left facial weakness and imprecise articulation. Speech is generally intelligible and pt can make her wants/needs known but recommend ST to target speech intelligibility within conversation after cognitive impairments improve.  Anticipate that pt will require Min A at time of discharge from CIR with some form of follow up therapy.   MBS completed after BSE with sensed aspiration of nectar thick liquids d/t incomplete glottal closure during swallow. See full report for details and recommendations.     Skilled Therapeutic Interventions          Skilled treatment session focused on assessment of s/s of aspiration with honey thick liquids and follow up MBS scheduled with informal cognitive linguistic  evaluation completed.    SLP Assessment  Patient will need skilled Speech Lanaguage Pathology Services during CIR admission    Recommendations  SLP Diet Recommendations: Dysphagia 1 (Puree);Honey Liquid Administration via: Cup;Spoon Medication Administration: Crushed with puree Supervision: Patient able to self feed;Staff to assist with self feeding;Full supervision/cueing for compensatory strategies Compensations: Slow rate;Minimize environmental distractions;Small sips/bites;Lingual sweep for clearance of pocketing Postural Changes and/or Swallow Maneuvers: Seated upright 90 degrees Oral Care Recommendations: Oral care BID Recommendations for Other Services: Neuropsych consult Patient destination: Home Follow up Recommendations: 24 hour supervision/assistance;Home Health SLP;Outpatient SLP Equipment Recommended: To be determined    SLP Frequency 3 to 5 out of 7 days   SLP Duration  SLP Intensity  SLP Treatment/Interventions 4 weeks  Minumum of 1-2 x/day, 30 to 90 minutes  Cognitive remediation/compensation;Cueing hierarchy;Internal/external aids;Therapeutic Activities;Patient/family education;Functional tasks    Pain    Prior Functioning Cognitive/Linguistic Baseline: Within functional limits Type of Home: House  Lives With: Alone Available Help at Discharge: Family Vocation: Retired  Industrial/product designer Term Goals: Week 1: SLP Short Term Goal 1 (Week 1): Pt will demonstrate sustained attention to task for ~ 15 minutes with Min A cues.  SLP Short Term Goal 2 (Week 1): Pt will scan to left of midline to locate items in 8 out of 10 opportunities with Max A cues.  SLP Short Term Goal 3 (Week 1): Pt will complete basic familiar problem solving tasks with Mod A cues.  SLP Short Term Goal 4 (Week 1): Pt will consume current diet with minimal s/s of aspiration and Mod A cues for use of compensatory  swallow strategies.  SLP Short Term Goal 5 (Week 1): Pt will consume trials of nectar thick  liquids with minimal overt s/s of aspiration with Max A cues for use of compensatory swallow strategies to indicate readiness for instrumental study.   Refer to Care Plan for Long Term Goals  Recommendations for other services: Neuropsych  Discharge Criteria: Patient will be discharged from SLP if patient refuses treatment 3 consecutive times without medical reason, if treatment goals not met, if there is a change in medical status, if patient makes no progress towards goals or if patient is discharged from hospital.  The above assessment, treatment plan, treatment alternatives and goals were discussed and mutually agreed upon: by patient  Tadan Shill 04/09/2018, 1:58 PM

## 2018-04-09 NOTE — Progress Notes (Signed)
Physical Therapy Session Note  Patient Details  Name: Jessica Meyer MRN: [id number] Date of Birth: [birth year]  Today's Date: 04/09/2018 PT Individual Time: 7618-4859 PT Individual Time Calculation (min): 40 min   Short Term Goals: Week 1:  PT Short Term Goal 1 (Week 1): Pt will perform bed<>chair transfer with Max assist +1 PT Short Term Goal 2 (Week 1): Pt will initiate gait training PT Short Term Goal 3 (Week 1): Pt will maintain static/dynamic sitting balance with CGA-min assist during functional tasks  Skilled Therapeutic Interventions/Progress Updates: Pt presented in bed agreeable to therapy. Upon initiating bed mobility PTA noted odor from bed. Pt performed bed mobility maxA with tactile cues for reaching for bed rails, and positioning. Pt noted to have incontinent stool. Pt able to perform bridge to allow PTA to pull pants over hips. Pt performed rolling L/R with +2 for safety while PTA performed peri-care. Upon completion pt attempted to scoot to [facility] requiring maxA for RUE placement and PTA blocking LLE to limit tone. Pt required max verbal cues and modA with blocking. Performed supine to sit at EOB with maxA and PTA blocking pt to decrease posterior lean. In sitting attempted to work on sitting balance with pt performing lateral leans to R however requiring significant re-direction due to difficulty for pt to sustain task. Pt noted to be somewhat impulsive and frequently asking if she could stand. Pt required consistent maxA for sitting balance due to poor awareness and pushing noted. Pt returned to supine maxA x 1 and required maxA for scooting to [facility]. Pt left with bed alarm on, call bell within reach and needs met.      Therapy Documentation Precautions:  Precautions Precautions: Fall Precaution Comments: left inattention and right gaze, increased extensor tone LLE and Flexion tone LUE at shoulder and elbow Restrictions Weight Bearing Restrictions: No General:   Vital  Signs: Therapy Vitals Temp: (!) 97.2 F (36.2 C) Pulse Rate: 66 Resp: 20 BP: (!) 107/52 Patient Position (if appropriate): Lying Oxygen Therapy SpO2: 99 % O2 Device: Room Air    Therapy/Group: Individual Therapy  Rodd Heft  Janielle Mittelstadt, PTA  04/09/2018, 4:03 PM

## 2018-04-09 NOTE — Progress Notes (Signed)
Calls frequently, decreased attention, focus and memory. Difficult to redirect at times. PRN tylenol # 3 given at 2139 with min. Relief of HA. At 0535 PRN tylenol given for HA. No coughing noted with meds whole in puree, but coughs almost after ever swallow with nectar thick liq. PRN sorbitol given this Am no results thus far. LBM 04/01. Patient picking at right groin incision, foam dressing applied. Incontinent of urine. Asks for bedpan while staff in room, then immediately says, "too late." Tone to LUE and LLE. Alfredo Martinez A

## 2018-04-09 NOTE — Progress Notes (Signed)
Physical Therapy Session Note  Patient Details  Name: Dewayne Burland MRN: [id number] Date of Birth: 01-11-1935  Today's Date: 04/09/2018 PT Individual Time: 1000-1030 PT Individual Time Calculation (min): 30 min   Short Term Goals: Week 1:  PT Short Term Goal 1 (Week 1): Pt will perform bed<>chair transfer with Max assist +1 PT Short Term Goal 2 (Week 1): Pt will initiate gait training PT Short Term Goal 3 (Week 1): Pt will maintain static/dynamic sitting balance with CGA-min assist during functional tasks  Skilled Therapeutic Interventions/Progress Updates:   Patient in w/c and staff in room, report pt eager to return to supine and reports soiled with BM and no lift pad available.  Assisted staff with squat pivot to L to bed with +2 A pt 20% max cues for anterior weight shift.  Patient assisted to supine total A and rolled to L with rail and  Mod to max A for removing brief and placing bed pan as pt not soiled, but still feels as if she needs BM.  A to roll to remove pan and for tech to assist with hygiene.  Replaced brief and pants total A (pt bridge x 2 with support for L LE).  Grandson called during session and pt falling asleep during conversation.  Left in supine with bed alarm on and call bell in reach.   Therapy Documentation Precautions:  Precautions Precautions: Fall Precaution Comments: left inattention and right gaze, increased extensor tone LLE and Flexion tone LUE at shoulder and elbow Restrictions Weight Bearing Restrictions: No Pain: Pain Assessment Faces Pain Scale: Hurts a little bit Pain Type: Acute pain Pain Location: Leg Pain Orientation: Left Pain Descriptors / Indicators: Discomfort Pain Onset: With Activity Pain Intervention(s): Repositioned    Therapy/Group: Individual Therapy  Elray Mcgregor  [place], PT 04/09/2018, 5:04 PM

## 2018-04-09 NOTE — Progress Notes (Signed)
Occupational Therapy Session Note  Patient Details  Name: Jessica Meyer MRN: [id number] Date of Birth: 22-Dec-1935  Today's Date: 04/09/2018 OT Individual Time: 1230-1257 OT Individual Time Calculation (min): 27 min    Short Term Goals: Week 1:  OT Short Term Goal 1 (Week 1): Pt will maintain static sitting balance EOB with mod A for 1 minute during ADL task OT Short Term Goal 2 (Week 1): Pt will attend to L visual field for ~30 seconds with moderate verbal cueing  OT Short Term Goal 3 (Week 1): Pt will initiate giving caregiver instructions with L UE positioning  OT Short Term Goal 4 (Week 1): Pt will don shirt at bed level with mod A  Skilled Therapeutic Interventions/Progress Updates:    Session focused on bed level midline orientation, gaze preference, and tracking L. Upon entry to room, OT stood on L side of pt's bed. Began speaking to pt and pt stated "where are you, I can't find you". With moderate cueing for head turn to L pt able to locate therapist. Continued cueing required to maintain sustained L attention. Pt was then assisted in washing hair at bed level, with maximal assist overall. Session was ended and pt passed off to transport to go for swallow study. Slight pain reported during brushing of hair, but pt willing to continue to get mattes out of hair.   Therapy Documentation Precautions:  Precautions Precautions: Fall Precaution Comments: left inattention and right gaze, increased extensor tone LLE and Flexion tone LUE at shoulder and elbow Restrictions Weight Bearing Restrictions: No   Therapy/Group: Individual Therapy  Crissie Reese 04/09/2018, 1:48 PM

## 2018-04-09 NOTE — Progress Notes (Signed)
[facility] Individual Statement of Services  Patient Name:  Jessica Meyer  Date:  04/09/2018  Welcome to the [facility]patient [facility].  Our goal is to provide you with an individualized program based on your diagnosis and situation, designed to meet your specific needs.  With this comprehensive rehabilitation program, you will be expected to participate in at least 3 hours of rehabilitation therapies Monday-Friday, with modified therapy programming on the weekends.  Your rehabilitation program will include the following services:  Physical Therapy (PT), Occupational Therapy (OT), Speech Therapy (ST), 24 hour per day rehabilitation nursing, Neuropsychology, Case Management (Social Worker), Rehabilitation Medicine, Nutrition Services and Pharmacy Services  Weekly team conferences will be held on Wednesdays to discuss your progress.  Your Social Worker will talk with you frequently to get your input and to update you on team discussions.  Team conferences with you and your family in attendance may also be held.  Expected length of stay:  4 weeks  Overall anticipated outcome:  Minimal to moderate assistance  Depending on your progress and recovery, your program may change. Your Social Worker will coordinate services and will keep you informed of any changes. Your Social Worker's name and contact numbers are listed  below.  The following services may also be recommended but are not provided by the [facility]:   Driving Evaluations  Home Health Rehabiltiation Services  Outpatient Rehabilitation Services   Arrangements will be made to provide these services after discharge if needed.  Arrangements include referral to agencies that provide these services.  Your insurance has been verified to be:  Medicare and [employer] Your primary doctor is:  Sandford Craze, FNP  Pertinent information will be shared with your doctor and your  insurance company.  Social Worker:  Staci Acosta, LCSW  [phone] or ([phone]  Information discussed with and copy given to patient by: Elvera Lennox, 04/09/2018, 2:09 PM

## 2018-04-09 NOTE — Plan of Care (Signed)
Inpatient Rehabilitation  Due to the current state of emergency from COVID-19, patients may not be receiving their 3-hours of Medicare-mandated therapy.    

## 2018-04-09 NOTE — Progress Notes (Signed)
Modified Barium Swallow Progress Note  Patient Details  Name: Jessica Meyer MRN: [id number] Date of Birth: 15-Feb-1935  Today's Date: 04/09/2018  Modified Barium Swallow completed.  Full report located under Chart Review in the Imaging Section.  Brief recommendations include the following:  Clinical Impression  Pt with sensed aspiration of nectar thick liquids via spoon d/t incomplete glottal closure during swallow (PAS 7). Trials of liquid textures were limited d/t severity of coughing fits with aspiration. Trialed head turn to pt's left but resulted in incomplete turn and aspiration. Honey thick liquids via cup sips and puree were functional.  Would recommend trialing chin tuck with spoonfuls of nectar thick liquids at bedside to observe if that makes clinical difference in aspiration. Continue puree diet with downgrade to honey thick liquids, medicine crushed in applesauce and full supervision.    Swallow Evaluation Recommendations       SLP Diet Recommendations: Dysphagia 1 (Puree) solids;Honey thick liquids   Liquid Administration via: Cup   Medication Administration: Crushed with puree   Supervision: Staff to assist with self feeding;Full supervision/cueing for compensatory strategies   Compensations: Slow rate;Minimize environmental distractions;Small sips/bites;Lingual sweep for clearance of pocketing   Postural Changes: Seated upright at 90 degrees   Oral Care Recommendations: Oral care BID   Other Recommendations: Order thickener from pharmacy    Rubina Basinski 04/09/2018,2:06 PM

## 2018-04-09 NOTE — Progress Notes (Addendum)
PHYSICAL MEDICINE & REHABILITATION PROGRESS NOTE  Subjective/Complaints:  Discussed with PT, elevated tone on left side an dtrunk  Pt c/o right flank pain , no sweats or chills  ROS: Denies CP, shortness of breath, nausea, vomiting, diarrhea.  Objective: Vital Signs: Blood pressure (!) 136/55, pulse 69, temperature (!) 97.3 F (36.3 C), resp. rate 20, height  (1.651 m), weight 66.2 kg, SpO2 96 %. No results found. Recent Labs    04/07/18 0232 04/09/18 0325  WBC 9.5 13.1*  HGB 9.3* 10.0*  HCT 27.7* 31.0*  PLT 197 208   Recent Labs    04/07/18 0232 04/09/18 0325  NA 137 137  K 3.7 3.9  CL 109 106  CO2 21* 22  GLUCOSE 129* 144*  BUN 15 15  CREATININE 1.11* 1.22*  CALCIUM 9.3 9.5    Physical Exam: BP (!) 136/55 (BP Location: Right Arm)   Pulse 69   Temp (!) 97.3 F (36.3 C)   Resp 20   Ht  (1.651 m)   Wt 66.2 kg   SpO2 96%   BMI 24.29 kg/m  Constitutional: Well-developed.  Well-nourished.  NAD.  Vital signs reviewed. HENT: Normocephalic.  Atraumatic. Eyes: EOMI.  No discharge. Cardiovascular: No JVD. Respiratory: Effort normal  GI: Nondistended Musculoskeletal: No edema or tenderness in extremities RIgh tflank tender Neurological: She is somnolent this a.m.  Left-sided neglect  Right gaze preference Follows commands.  Fair insight to deficits. Dysarthria Motor: LLE: 0/5 proximal to distal RLE: 5/5 proximal to distal Sensation absent to light touch LLE  Severe left neglect Poor attention Skin: Skin is warm and dry.  Psychiatric: Her affect is blunt. Her speech is delayed.   Assessment/Plan: 1. Functional deficits secondary to right MCA infarct which require 3+ hours per day of interdisciplinary therapy in a comprehensive inpatient rehab setting.  Physiatrist is providing close team supervision and 24 hour management of active medical problems listed below.  Physiatrist and rehab team continue to assess barriers to  discharge/monitor patient progress toward functional and medical goals  Care Tool:  Bathing  Bathing activity did not occur: Safety/medical concerns           Bathing assist       Upper Body Dressing/Undressing Upper body dressing   What is the patient wearing?: Pull over shirt, Bra    Upper body assist Assist Level: 2 Helpers    Lower Body Dressing/Undressing Lower body dressing    Lower body dressing activity did not occur: Safety/medical concerns What is the patient wearing?: Incontinence brief     Lower body assist Assist for lower body dressing: Total Assistance - Patient < 25%     Toileting Toileting Toileting Activity did not occur Press photographer and hygiene only): Safety/medical concerns  Toileting assist Assist for toileting: Dependent - Patient 0%     Transfers Chair/bed transfer  Transfers assist  Chair/bed transfer activity did not occur: Safety/medical concerns  Chair/bed transfer assist level: 2 Helpers     Locomotion Ambulation   Ambulation assist   Ambulation activity did not occur: Safety/medical concerns          Walk 10 feet activity   Assist  Walk 10 feet activity did not occur: Safety/medical concerns        Walk 50 feet activity   Assist Walk 50 feet with 2 turns activity did not occur: Safety/medical concerns         Walk 150 feet activity   Assist Walk 150 feet  activity did not occur: Safety/medical concerns         Walk 10 feet on uneven surface  activity   Assist Walk 10 feet on uneven surfaces activity did not occur: Safety/medical concerns         Wheelchair     Assist Will patient use wheelchair at discharge?: Yes Type of Wheelchair: Manual    Wheelchair assist level: Dependent - Patient 0%(tilt in space w/c) Max wheelchair distance: 150 ft    Wheelchair 50 feet with 2 turns activity    Assist        Assist Level: Dependent - Patient 0%   Wheelchair 150 feet  activity     Assist     Assist Level: Dependent - Patient 0%      Medical Problem List and Plan: 1.  Left side weakness with dysphagia secondary to right MCA infarction in the setting of right ICA and right MCA occlusion status post unsuccessful attempts at thrombectomy.  CIR PT, OT 2.  Antithrombotics: -DVT/anticoagulation:  SCDs             -antiplatelet therapy: aspirin 81 mg daily, Plavix 75 mg daily 3. Pain Management:  Tylenol 3 for headache as needed 4. Mood: Wellbutrin 300 mg daily,Lexapro 5 mg daily at bedtime             -antipsychotic agents: N/A 5. Neuropsych: This patient is not fully capable of making decisions on her own behalf. 6. Skin/Wound Care:  Routine skin checks 7. Fluids/Electrolytes/Nutrition:  Routine in and out's with follow-up chemistries tomorrow. 8. Dysphagia. Dysphagia #1 nectar liquids. Follow-up speech therapy             Advance diet as tolerated 9. Hypertension. Norvasc 5 mg daily,lisinopril 40 mg daily, Coreg 12.5 mg twice a day.   Monitor with increased mobility 10. Diabetes mellitus. Hemoglobin A1c 8.0. Glucotrol XL 2.5 mg daily, Lantus insulin 12 units daily at bedtime. Check blood sugars before meals and at bedtime.      CBG (last 3)  Recent Labs    04/08/18 1750 04/08/18 2101 04/09/18 0634  GLUCAP 308* 104* 252*  labile increase lantus  11. Hyperlipidemia. Lipitor 12. Hypothyroidism. Continue Synthroid 13.  Acute blood loss anemia:              Hemoglobin 9.3 on 4/4 follow-up tomorrow 14.  CKD stage III:             Creatinine 1.11 on 4/4. 1.22 on 4/6 15.  Leukocytosis-afeb Has urinary incont and Right flank pain check UA C and S  Post Admission Physician Evaluation: 1. Functional deficits secondary  to Right MCA infarct. 2. Patient admitted to receive collaborative, interdisciplinary care between the physiatrist, rehab nursing staff, and therapy team. 3. Patient's level of medical complexity and substantial therapy needs in  context of that medical necessity cannot be provided at a lesser intensity of care. 4. Patient has experienced substantial functional loss from his/her baseline. Upon functional assessment at the time of the preadmission screening, patient was total assist x 2 for transfer.  Upon most recent functional evaluation, patient was total assist x 2 for transfer using slide board.  Judging by the patient's diagnosis, physical exam, and functional history, the patient has potential for functional progress which will result in measurable gains while on inpatient rehab.  These gains will be of substantial and practical use upon discharge in facilitating mobility and self-care at the household level. 5. Physiatrist will provide 24 hour management of  medical needs as well as oversight of the therapy plan/treatment and provide guidance as appropriate regarding the interaction of the two. 6. 24 hour rehab nursing will assist in the management of  bladder management, bowel management, safety, skin/wound care, disease management, medication administration, pain management and patient education  and help integrate therapy concepts, techniques,education, etc. PT will assess and treat for:pre gait, gait training, endurance , safety, equipment, neuromuscular re education   .  Goals are: minimal assist. 7. OT will assess and treat for ADLs, Cognitive perceptual skills, Neuromuscular re education, safety, endurance, equipment  .  Goals are: moderate assist.  8. SLP will assess and treat for Dysphagia, Right brain cognitive dysfunction  .  Goals are: minimal assist. 9. Case Management and Social Worker will assess and treat for psychological issues and discharge planning. 10. Team conference will be held weekly to assess progress toward goals and to determine barriers to discharge. 11.  Patient will receive at least 3 hours of therapy per day at least 5 days per week. 12. ELOS and Prognosis: 19-22d good    LOS: 2 days A  FACE TO FACE EVALUATION WAS PERFORMED  Erick Colace 04/09/2018, 8:43 AM

## 2018-04-09 NOTE — Progress Notes (Signed)
Physical Therapy Session Note  Patient Details  Name: Jessica Meyer MRN: [id number] Date of Birth: 1935-06-27  Today's Date: 04/09/2018 PT Individual Time: 0800-0910 PT Individual Time Calculation (min): 70 min   Short Term Goals: Week 1:  PT Short Term Goal 1 (Week 1): Pt will perform bed<>chair transfer with Max assist +1 PT Short Term Goal 2 (Week 1): Pt will initiate gait training PT Short Term Goal 3 (Week 1): Pt will maintain static/dynamic sitting balance with CGA-min assist during functional tasks  Skilled Therapeutic Interventions/Progress Updates:    Pt supine in bed upon PT arrival, agreeable to therapy tx and denies pain at rest. Pt supine in bed performed rolling to L with mod assist and rolling R with max assist to don pants. Pt transferred to sitting EOB with total assist. Pt worked on sitting balance EOB with max cues and manual facilitation for midline orientation, requires mod-max assist for sitting balance. Pt performed slideboard transfer to TIS w/c with total assist +2. Pt transported to dayroom. Pt used standing frame this session x 10 minutes working on L LE weightbearing for tone management, midline orientation with use of mirror and standing tolerance, therapist providing manual facilitation for increased R lateral weightshift, hip/trunk extension. Pt demonstrates pushing tendencies in standing with L lateral lean, requiring max cues to correct. Pt reports having to use bathroom. Pt transported back to room, transferred to bed total assist +2 with slideboard. Pt performed rolling with max assist to doff clothes and place bedpan, total assist for peri care, bowel movement this session. Pt transferred back to sitting with total assist and back to w/c with slideboard total assist +2. Therapist performed L UE PROM this session x 3 minutes. Pt left seated in TIS w/c at end of session with chair alarm set and needs in reach.   Therapy Documentation Precautions:   Precautions Precautions: Fall Precaution Comments: left inattention and right gaze, increased extensor tone LLE and Flexion tone LUE at shoulder and elbow Restrictions Weight Bearing Restrictions: No    Therapy/Group: Individual Therapy  Cresenciano Genre, PT, DPT 04/09/2018, 7:45 AM

## 2018-04-10 ENCOUNTER — Inpatient Hospital Stay (HOSPITAL_COMMUNITY): Payer: Medicare Other | Admitting: Physical Therapy

## 2018-04-10 ENCOUNTER — Inpatient Hospital Stay (HOSPITAL_COMMUNITY): Payer: Medicare Other

## 2018-04-10 ENCOUNTER — Encounter (HOSPITAL_COMMUNITY): Payer: Medicare Other | Admitting: Psychology

## 2018-04-10 ENCOUNTER — Inpatient Hospital Stay (HOSPITAL_COMMUNITY): Payer: Medicare Other | Admitting: Occupational Therapy

## 2018-04-10 LAB — GLUCOSE, CAPILLARY
Glucose-Capillary: 151 mg/dL — ABNORMAL HIGH (ref 70–99)
Glucose-Capillary: 240 mg/dL — ABNORMAL HIGH (ref 70–99)
Glucose-Capillary: 167 mg/dL — ABNORMAL HIGH (ref 70–99)
Glucose-Capillary: 176 mg/dL — ABNORMAL HIGH (ref 70–99)

## 2018-04-10 LAB — URINE CULTURE: Culture: NO GROWTH

## 2018-04-10 MED ORDER — MUSCLE RUB 10-15 % EX CREA
TOPICAL_CREAM | Freq: Two times a day (BID) | CUTANEOUS | Status: DC | PRN
  Administered 2018-04-14 – 2018-04-15 (×3): 1 via TOPICAL
  Administered 2018-04-18 (×2): via TOPICAL
  Administered 2018-04-23 – 2018-04-24 (×2): 1 via TOPICAL
  Filled 2018-04-10: qty 85

## 2018-04-10 MED ORDER — CODEINE SULFATE 15 MG PO TABS
30.0000 mg | ORAL_TABLET | ORAL | Status: DC | PRN
  Administered 2018-04-12 – 2018-04-18 (×7): 30 mg via ORAL
  Filled 2018-04-10 (×8): qty 2

## 2018-04-10 MED ORDER — ACETAMINOPHEN-CODEINE #3 300-30 MG PO TABS
1.0000 | ORAL_TABLET | ORAL | Status: DC | PRN
  Administered 2018-04-10 – 2018-04-18 (×22): 1 via ORAL
  Filled 2018-04-10 (×22): qty 1

## 2018-04-10 MED ORDER — ACETAMINOPHEN-CODEINE #4 300-60 MG PO TABS: 1.0000 | ORAL_TABLET | ORAL | Status: DC | PRN

## 2018-04-10 MED ORDER — TROLAMINE SALICYLATE 10 % EX CREA: TOPICAL_CREAM | Freq: Two times a day (BID) | CUTANEOUS | Status: DC | PRN

## 2018-04-10 MED ORDER — METHOCARBAMOL 500 MG PO TABS
500.0000 mg | ORAL_TABLET | Freq: Three times a day (TID) | ORAL | Status: DC | PRN
  Administered 2018-04-12 – 2018-05-04 (×25): 500 mg via ORAL
  Filled 2018-04-10 (×27): qty 1

## 2018-04-10 NOTE — Progress Notes (Signed)
[place] PHYSICAL MEDICINE & REHABILITATION PROGRESS NOTE  Subjective/Complaints:  Patient remains confused calling out.  She has been oriented to the call bell but does not use it.  She wants to get back into bed and is up in a reclining tilt in space chair.  She complains of right-sided neck pain today.  She has severe left neglect and keeps her head tilted toward the left side She is not complaining of any pain radiating into the arm.  ROS: Denies CP, shortness of breath, nausea, vomiting, diarrhea.  Objective: Vital Signs: Blood pressure 128/64, pulse 67, temperature 97.9 F (36.6 C), resp. rate 19, height 5\' 5"  (1.651 m), weight 66.2 kg, SpO2 94 %. Dg Chest 2 View  Result Date: 04/10/2018 CLINICAL DATA:  Right side weakness. History of stroke. Question aspiration. EXAM: CHEST - 2 VIEW COMPARISON:  Single-view of the chest 01/25/2016. FINDINGS: Minimal atelectasis is seen in the left lung base. The lungs are otherwise clear. Heart size is upper normal. Aortic atherosclerosis is noted. No pneumothorax or pleural effusion. No acute or focal bony abnormality. IMPRESSION: Negative for aspiration.  No acute disease. Atherosclerosis. Electronically Signed   By: Drusilla Kannerhomas  Dalessio M.D.   On: 04/10/2018 08:24   Dg Swallowing Func-speech Pathology  Result Date: 04/09/2018 Objective Swallowing Evaluation: Type of Study: Bedside Swallow Evaluation  Patient Details Name: Jessica SaxonDoris Ann Meyer MRN: [id number] Date of Birth: 05/18/1935 Today's Date: 04/09/2018 Time: SLP Start Time (ACUTE ONLY): 1057 -SLP Stop Time (ACUTE ONLY): 1121 SLP Time Calculation (min) (ACUTE ONLY): 24 min Past Medical History: Past Medical History: Diagnosis Date . Arthritis  . Diabetes mellitus without complication (HCC)  . History of chicken pox  . Hyperlipidemia  . Hypertension  . Thyroid disease   hypothyroid Past Surgical History: Past Surgical History: Procedure Laterality Date . ABDOMINAL HYSTERECTOMY   . HIP ARTHROPLASTY Right  01/26/2016  Procedure: ARTHROPLASTY BIPOLAR HIP (HEMIARTHROPLASTY);  Surgeon: Sheral Apleyimothy D Murphy, MD;  [facility];  Service: Orthopedics;  Laterality: Right; . IR ANGIO EXTRACRAN SEL COM CAROTID INNOMINATE UNI L MOD SED  04/05/2018 . IR CT HEAD LTD  04/05/2018 . IR PERCUTANEOUS ART THROMBECTOMY/INFUSION INTRACRANIAL INC DIAG ANGIO  04/05/2018 . KNEE ARTHROSCOPY Bilateral  . RADIOLOGY WITH ANESTHESIA N/A 04/04/2018  Procedure: RADIOLOGY WITH ANESTHESIA;  Surgeon: Radiologist, Medication, MD;  Location: [facility] OR;  Service: Radiology;  Laterality: N/A; . REPLACEMENT TOTAL KNEE BILATERAL    1998 left , 1992 right HPI: 83 y.o. female with a history of hypertension, hyperlipidemia, diabetes who presented with left-sided weakness and neglect. Pt with right MCA occlusion.   Bilateral common carotid arteriograms followed unsuccessful attempt at revascularization of Rt MCA occlusion due to prox Rt ICA occlusion despite multiple angioplasties. intubated for procedure only. Failed RN stroke swallow screen.  Subjective: alert but groggy Assessment / Plan / Recommendation CHL IP CLINICAL IMPRESSIONS 04/09/2018 Clinical Impression Pt with sensed aspiration of nectar thick liquids via spoon d/t incomplete glottal closure during swallow (PAS 7). Trials of liquid textures were limited d/t severity of coughing fits with aspiration. Trialed head turn to pt's left but resulted in incomplete turn and aspiration. Honey thick liquids via cup sips and puree were functional.  Would recommend trialing chin tuck with spoonfuls of nectar thick liquids at bedside to observe if that makes clinical difference in aspiration. Continue puree diet with downgrade to honey thick liquids, medicine crushed in applesauce and full supervision.  SLP Visit Diagnosis Dysphagia, pharyngeal phase (R13.13);Cognitive communication deficit (R41.841) Attention and concentration deficit  following -- Frontal lobe and executive function deficit following -- Impact on safety and  function Severe aspiration risk;Risk for inadequate nutrition/hydration   CHL IP TREATMENT RECOMMENDATION 04/09/2018 Treatment Recommendations Therapy as outlined in treatment plan below   No flowsheet data found. CHL IP DIET RECOMMENDATION 04/09/2018 SLP Diet Recommendations Dysphagia 1 (Puree) solids;Honey thick liquids Liquid Administration via Cup Medication Administration Crushed with puree Compensations Slow rate;Minimize environmental distractions;Small sips/bites;Lingual sweep for clearance of pocketing Postural Changes Seated upright at 90 degrees   CHL IP OTHER RECOMMENDATIONS 04/09/2018 Recommended Consults -- Oral Care Recommendations Oral care BID Other Recommendations Order thickener from pharmacy   CHL IP FOLLOW UP RECOMMENDATIONS 04/06/2018 Follow up Recommendations Inpatient Rehab   CHL IP FREQUENCY AND DURATION 04/05/2018 Speech Therapy Frequency (ACUTE ONLY) min 2x/week Treatment Duration --      CHL IP ORAL PHASE 04/09/2018 Oral Phase WFL Oral - Pudding Teaspoon -- Oral - Pudding Cup -- Oral - Honey Teaspoon -- Oral - Honey Cup -- Oral - Nectar Teaspoon -- Oral - Nectar Cup -- Oral - Nectar Straw -- Oral - Thin Teaspoon -- Oral - Thin Cup -- Oral - Thin Straw -- Oral - Puree -- Oral - Mech Soft -- Oral - Regular -- Oral - Multi-Consistency -- Oral - Pill -- Oral Phase - Comment --  CHL IP PHARYNGEAL PHASE 04/09/2018 Pharyngeal Phase Impaired Pharyngeal- Pudding Teaspoon -- Pharyngeal -- Pharyngeal- Pudding Cup -- Pharyngeal -- Pharyngeal- Honey Teaspoon WFL Pharyngeal -- Pharyngeal- Honey Cup WFL Pharyngeal -- Pharyngeal- Nectar Teaspoon Reduced airway/laryngeal closure;Moderate aspiration;Penetration/Aspiration during swallow Pharyngeal Material enters airway, passes BELOW cords and not ejected out despite cough attempt by patient Pharyngeal- Nectar Cup -- Pharyngeal -- Pharyngeal- Nectar Straw -- Pharyngeal -- Pharyngeal- Thin Teaspoon -- Pharyngeal -- Pharyngeal- Thin Cup -- Pharyngeal -- Pharyngeal- Thin  Straw -- Pharyngeal -- Pharyngeal- Puree -- Pharyngeal -- Pharyngeal- Mechanical Soft -- Pharyngeal -- Pharyngeal- Regular -- Pharyngeal -- Pharyngeal- Multi-consistency -- Pharyngeal -- Pharyngeal- Pill -- Pharyngeal -- Pharyngeal Comment --  CHL IP CERVICAL ESOPHAGEAL PHASE 04/09/2018 Cervical Esophageal Phase WFL Pudding Teaspoon -- Pudding Cup -- Honey Teaspoon -- Honey Cup -- Nectar Teaspoon -- Nectar Cup -- Nectar Straw -- Thin Teaspoon -- Thin Cup -- Thin Straw -- Puree -- Mechanical Soft -- Regular -- Multi-consistency -- Pill -- Cervical Esophageal Comment -- [name] 04/09/2018, 2:06 PM              Recent Labs    04/09/18 0325  WBC 13.1*  HGB 10.0*  HCT 31.0*  PLT 208   Recent Labs    04/09/18 0325  NA 137  K 3.9  CL 106  CO2 22  GLUCOSE 144*  BUN 15  CREATININE 1.22*  CALCIUM 9.5    Physical Exam: BP 128/64 (BP Location: Left Arm)   Pulse 67   Temp 97.9 F (36.6 C)   Resp 19   Ht 5\' 5"  (1.651 m)   Wt 66.2 kg   SpO2 94%   BMI 24.29 kg/m  Constitutional: Well-developed.  Well-nourished.  NAD.  Vital signs reviewed. HENT: Normocephalic.  Atraumatic. Eyes: EOMI.  No discharge. Cardiovascular: No JVD. Respiratory: Effort normal  GI: Nondistended Musculoskeletal: No edema or tenderness in extremities RIgh tflank tender Neurological: She is somnolent this a.m.  Left-sided neglect   Follows commands.    Poor insight into deficits Dysarthria Motor: LLE: 0/5 proximal to distal 0/5 left upper extremity RLE: 5/5 proximal to distal Sensation absent to light touch LLE  Severe left neglect Poor attention and concentration Skin: Skin is warm and dry.  Psychiatric: Her affect is blunt. Her speech is delayed.   Assessment/Plan: 1. Functional deficits secondary to right MCA infarct which require 3+ hours per day of interdisciplinary therapy in a comprehensive inpatient rehab setting.  Physiatrist is providing close team supervision and 24 hour management of  active medical problems listed below.  Physiatrist and rehab team continue to assess barriers to discharge/monitor patient progress toward functional and medical goals  Care Tool:  Bathing  Bathing activity did not occur: Safety/medical concerns           Bathing assist       Upper Body Dressing/Undressing Upper body dressing   What is the patient wearing?: Pull over shirt    Upper body assist Assist Level: 2 Helpers    Lower Body Dressing/Undressing Lower body dressing    Lower body dressing activity did not occur: Safety/medical concerns What is the patient wearing?: Incontinence brief, Pants     Lower body assist Assist for lower body dressing: 2 Helpers     Toileting Toileting Toileting Activity did not occur (Clothing management and hygiene only): Safety/medical concerns  Toileting assist Assist for toileting: Total Assistance - Patient < 25%     Transfers Chair/bed transfer  Transfers assist  Chair/bed transfer activity did not occur: Safety/medical concerns  Chair/bed transfer assist level: 2 Helpers(slide board)     Locomotion Ambulation   Ambulation assist   Ambulation activity did not occur: Safety/medical concerns          Walk 10 feet activity   Assist  Walk 10 feet activity did not occur: Safety/medical concerns        Walk 50 feet activity   Assist Walk 50 feet with 2 turns activity did not occur: Safety/medical concerns         Walk 150 feet activity   Assist Walk 150 feet activity did not occur: Safety/medical concerns         Walk 10 feet on uneven surface  activity   Assist Walk 10 feet on uneven surfaces activity did not occur: Safety/medical concerns         Wheelchair     Assist Will patient use wheelchair at discharge?: Yes Type of Wheelchair: Manual    Wheelchair assist level: Dependent - Patient 0%(tilt in space w/c) Max wheelchair distance: 150 ft    Wheelchair 50 feet with 2 turns  activity    Assist        Assist Level: Dependent - Patient 0%   Wheelchair 150 feet activity     Assist     Assist Level: Dependent - Patient 0%      Medical Problem List and Plan: 1.  Left side weakness with dysphagia secondary to right MCA infarction in the setting of right ICA and right MCA occlusion status post unsuccessful attempts at thrombectomy.  CIR PT, OT 2.  Antithrombotics: -DVT/anticoagulation:  SCDs             -antiplatelet therapy: aspirin 81 mg daily, Plavix 75 mg daily 3. Pain Management:  Tylenol 3 for headache as needed, flank pain has subsided, right-sided neck pain is the current complaint.  Will change Tylenol 3 to Tylenol for added Robaxin discontinue gabapentin.  Also add trolamine cream, patient tends to perseverate on one complaint which also makes pain assessment difficult 4. Mood: Wellbutrin 300 mg daily,Lexapro 5 mg daily at bedtime             -  antipsychotic agents: N/A 5. Neuropsych: This patient is not fully capable of making decisions on her own behalf. 6. Skin/Wound Care:  Routine skin checks 7. Fluids/Electrolytes/Nutrition:  Routine in and out's with follow-up chemistries tomorrow. 8. Dysphagia. Dysphagia #1 nectar liquids. Follow-up speech therapy             Advance diet as tolerated 9. Hypertension. Norvasc 5 mg daily,lisinopril 40 mg daily, Coreg 12.5 mg twice a day.   Monitor with increased mobility 10. Diabetes mellitus. Hemoglobin A1c 8.0. Glucotrol XL 2.5 mg daily, Lantus insulin 12 units daily at bedtime. Check blood sugars before meals and at bedtime.      CBG (last 3)  Recent Labs    04/09/18 1717 04/09/18 2117 04/10/18 0740  GLUCAP 224* 177* 151*  Blood sugar improved this morning we will continue to monitor 11. Hyperlipidemia. Lipitor 12. Hypothyroidism. Continue Synthroid 13.  Acute blood loss anemia:              Hemoglobin 9.3 on 4/4 follow-up tomorrow 14.  CKD stage III:             Creatinine 1.11 on 4/4.  1.22 on 4/6 15.  Leukocytosis-afeb Has urinary incont and Right flank pain, now resolved UA negative  Post Admission Physician Evaluation: 1. Functional deficits secondary  to Right MCA infarct. 2. Patient admitted to receive collaborative, interdisciplinary care between the physiatrist, rehab nursing staff, and therapy team. 3. Patient's level of medical complexity and substantial therapy needs in context of that medical necessity cannot be provided at a lesser intensity of care. 4. Patient has experienced substantial functional loss from his/her baseline. Upon functional assessment at the time of the preadmission screening, patient was total assist x 2 for transfer.  Upon most recent functional evaluation, patient was total assist x 2 for transfer using slide board.  Judging by the patient's diagnosis, physical exam, and functional history, the patient has potential for functional progress which will result in measurable gains while on inpatient rehab.  These gains will be of substantial and practical use upon discharge in facilitating mobility and self-care at the household level. 5. Physiatrist will provide 24 hour management of medical needs as well as oversight of the therapy plan/treatment and provide guidance as appropriate regarding the interaction of the two. 6. 24 hour rehab nursing will assist in the management of  bladder management, bowel management, safety, skin/wound care, disease management, medication administration, pain management and patient education  and help integrate therapy concepts, techniques,education, etc. PT will assess and treat for:pre gait, gait training, endurance , safety, equipment, neuromuscular re education   .  Goals are: minimal assist. 7. OT will assess and treat for ADLs, Cognitive perceptual skills, Neuromuscular re education, safety, endurance, equipment  .  Goals are: moderate assist.  8. SLP will assess and treat for Dysphagia, Right brain cognitive  dysfunction  .  Goals are: minimal assist. 9. Case Management and Social Worker will assess and treat for psychological issues and discharge planning. 10. Team conference will be held weekly to assess progress toward goals and to determine barriers to discharge. 11.  Patient will receive at least 3 hours of therapy per day at least 5 days per week. 12. ELOS and Prognosis: 19-22d good    LOS: 3 days A FACE TO FACE EVALUATION WAS PERFORMED  Erick Colace 04/10/2018, 9:57 AM

## 2018-04-10 NOTE — Progress Notes (Signed)
Speech Language Pathology Daily Session Note  Patient Details  Name: Jessica Meyer MRN: [id number] Date of Birth: 1935-08-19  Today's Date: 04/10/2018 SLP Individual Time: 2992-4268 SLP Individual Time Calculation (min): 58 min  Short Term Goals: Week 1: SLP Short Term Goal 1 (Week 1): Pt will demonstrate sustained attention to task for ~ 15 minutes with Min A cues.  SLP Short Term Goal 2 (Week 1): Pt will scan to left of midline to locate items in 8 out of 10 opportunities with Max A cues.  SLP Short Term Goal 3 (Week 1): Pt will complete basic familiar problem solving tasks with Mod A cues.  SLP Short Term Goal 4 (Week 1): Pt will consume current diet with minimal s/s of aspiration and Mod A cues for use of compensatory swallow strategies.  SLP Short Term Goal 5 (Week 1): Pt will consume trials of nectar thick liquids with minimal overt s/s of aspiration with Max A cues for use of compensatory swallow strategies to indicate readiness for instrumental study.   Skilled Therapeutic Interventions: Skilled ST services focused on swallow and cognition skills. Pt was asleep upon entering, however easy aroused. SLP repositioned pt in bed for PO consumption, pt consumed HTL via straw with instruction of chin truck to assess carry over of strategy for possible NTL trails. Pt required max A verbal cues to preform a complete chin tuck on HTL via straw, with mirror in front of pt to aid in biofeedback (however pt stated she still could not see her self, but was able to name the number of fingers held in front of the mirror x3) and further impacted by lethargy, max A verbal cues for attention in 10 second intervals with eyes closing often.  Pt requested to use the bathroom, then immediately urinated in brief. SLP and NT, [facility] assisted with changing brief in bed, pt required mod A verbal cues to follow commands in bed, likely due to hearing deficits and attention. Pt's phone rang, pt attempted to talk to  granddaughter on phone, requiring max A verbal cues for attention and repetition of messages every few words. Pt demonstrated increase alertness following ambulation in bed and phone cal. SLP facilitated oral care piror to NTL via straw trials with chin tuck, Pt was able to complete x1 chin tuck with max A verbal/tatical cues, without overt s/s aspiration, however on the second trial, pt preformed incomplete chin tuck despite max A verbal/tatcile cues resulting in coughing episode. SLP recommends current diet, due to risk of aspiration and not continuing NTL trials until improvement in cognitive function, primarily in attention. SLP facilitated sustained attention skills in selective listening task for the letter "A", pt demonstrated ability to distinguish "A" independently, among other letters, for 40 seconds and then errors occurred along with redeuced attention, pt began to fall asleep. SLP educated pt in focusing on increasing attention for longer periods of time.Pt was left in room with call bell within reach and bed alarm set. ST recommends to continue skilled ST services.      Pain Pain Assessment Pain Scale: 0-10 Pain Score: 10-Worst pain ever Faces Pain Scale: Hurts a little bit Pain Location: Back Pain Frequency: Intermittent Pain Onset: Gradual Pain Intervention(s): Medication (See eMAR)  Therapy/Group: Individual Therapy  Jessica Meyer  [facility] 04/10/2018, 7:48 AM

## 2018-04-10 NOTE — Progress Notes (Signed)
Social Work Assessment and Plan  Patient Details  Name: Jessica Meyer MRN: [id number] Date of Birth: 28-Jul-1935  Today's Date: 04/09/2018  Problem List:  Patient Active Problem List   Diagnosis Date Noted  . Right middle cerebral artery stroke ([place]) 04/07/2018  . Left-sided weakness   . CKD (chronic kidney disease), stage III ([place])   . Acute blood loss anemia   . Hypothyroidism   . Dyslipidemia   . Diabetes mellitus type 2 in nonobese (HCC)   . Dysphagia, post-stroke   . Dysarthria, post-stroke   . Middle cerebral artery embolism, right 04/05/2018  . Stroke (cerebrum) ([place]) 04/04/2018  . Atypical chest pain 11/10/2017  . CKD stage G3b/A1, GFR 30-44 and albumin creatinine ratio <30 mg/g (HCC) 08/10/2016  . Left hip pain 06/14/2016  . Type 2 diabetes mellitus with stage 3 chronic kidney disease, with long-term current use of insulin ([place]) 01/29/2016  . Leukocytosis 01/25/2016  . Closed fracture of right hip ([place])   . Fall   . Left knee pain 05/28/2015  . Sprain of hand, third finger, right 05/28/2015  . HTN (hypertension) 07/29/2014  . Gait instability 07/29/2014  . Hyperlipidemia 07/29/2014  . Right knee pain 04/11/2014  . ASNHL (asymmetrical sensorineural hearing loss) 08/07/2013  . 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 Hernia, rectovaginal 08/07/2013  . Hypercholesterolemia without hypertriglyceridemia 08/07/2013  . Arthritis of knee, degenerative 08/07/2013  . Degenerative arthritis of lumbar spine 08/07/2013  . Osteopenia 08/07/2013  . Adult hypothyroidism 08/07/2013  . History of artificial joint 08/07/2013  . Essential (primary) hypertension 08/07/2013  . Depression with anxiety 08/07/2013   Past Medical History:  Past Medical History:  Diagnosis Date  . Arthritis   . Diabetes mellitus without complication ([place])   . History of chicken pox   . Hyperlipidemia   . Hypertension   . Thyroid disease    hypothyroid   Past Surgical History:  Past Surgical History:   Procedure Laterality Date  . ABDOMINAL HYSTERECTOMY    . HIP ARTHROPLASTY Right 01/26/2016   Procedure: ARTHROPLASTY BIPOLAR HIP (HEMIARTHROPLASTY);  Surgeon: Renette Butters, MD;  Location: [facility];  Service: Orthopedics;  Laterality: Right;  . IR ANGIO EXTRACRAN SEL COM CAROTID INNOMINATE UNI L MOD SED  04/05/2018  . IR CT HEAD LTD  04/05/2018  . IR PERCUTANEOUS ART THROMBECTOMY/INFUSION INTRACRANIAL INC DIAG ANGIO  04/05/2018  . KNEE ARTHROSCOPY Bilateral   . RADIOLOGY WITH ANESTHESIA N/A 04/04/2018   Procedure: RADIOLOGY WITH ANESTHESIA;  Surgeon: Radiologist, Medication, MD;  Location: [facility];  Service: Radiology;  Laterality: N/A;  . REPLACEMENT TOTAL KNEE BILATERAL     1998 left , 1992 right   Social History:  reports that she has never smoked. Her smokeless tobacco use includes snuff. She reports current alcohol use of about 1.0 standard drinks of alcohol per week. She reports that she does not use drugs.  Family / Support Systems Marital Status: Widow/Widower Patient Roles: Parent, Other (Comment)(grandmother) Children: [name] - dtr - [phone] Other Supports: [name] - son-in-law - [phone]; grandchildren Anticipated Caregiver: [name] and grandchildren; paid caregivers, if needed Ability/Limitations of Caregiver: mod A, per Admissions Coordinator Caregiver Availability: 24/7 Family Dynamics: supportive family  Social History Preferred language: English Religion: Baptist Read: Yes Write: Yes Employment Status: Retired Public relations account executive Issues: none reported Guardian/Conservator: MD has determined that pt is not fully capable of making her own decisions.  Dtr would be next of kin for decision making purposes.   Abuse/Neglect  Abuse/Neglect Assessment Can Be Completed: Yes Physical Abuse: Denies Verbal Abuse: Denies Sexual Abuse: Denies Exploitation of patient/patient's resources: Denies Self-Neglect: Denies  Emotional Status Pt's affect,  behavior and adjustment status: Pt was talkative.  She reported being uncomfortable in her bed, complaining of needing her pillow readjusted.  CSW assisted her and she reported some relief.  She did not talk about her stroke very much, but appeared in good spirits and willing to work with therapists. Recent Psychosocial Issues: Pt has a dog and cat at home she is missing.  She also needs to get back to her garden. Psychiatric History: none reported, but CSW will confirm with pt's dtr Substance Abuse History: none reported  Patient / Family Perceptions, Expectations & Goals Pt/Family understanding of illness & functional limitations: Pt seems to have a little understanding of her condition and limitations.  CSW will continue to assess. Premorbid pt/family roles/activities: Pt likes to garden and goes out to run errands with her family.  Does not drive. Anticipated changes in roles/activities/participation: Pt would like to resume activities as she is able. Pt/family expectations/goals: Pt wants to get better so she can return to her garden and her dog and cat.  Community [employer] Agencies: None Premorbid Home Care/DME Agencies: Other (Comment)(Pt has some kind of shower aide and a single point cane.  CSW will confirm with pt's dtr.) Transportation available at discharge: family Resource referrals recommended: Neuropsychology, Support group (specify)  Discharge Planning Living Arrangements: Alone Support Systems: Children, Other relatives Type of Residence: Private residence Insurance Resources: [employer], Multimedia programmer (specify)([employer]) Financial Resources: [place] Referred: No Money Management: Patient, Family Does the patient have any problems obtaining your medications?: No Home Management: Pt would do a lot of this, family would assist as needed. Patient/Family Preliminary Plans: Pt plans to return to her home with her family and/or  paid caregivers.  Admissions Coordinator was told that pt would go to her dtr's home and be cared for there.  CSW will talk with pt's dtr to discover d/c plan. Social Work Anticipated Follow Up Needs: HH/OP, Support Group Expected length of stay: 4 weeks  Clinical Impression CSW met with pt and left a voice message for pt's dtr to introduce self and role of CSW, as well as to complete assessment.  Pt was able to answer many of CSW's questions, but wanted CSW to confirm with dtr.  Pt has good family support and will go to her home or dtr's home with family and/or paid caregivers to care for pt. Pt seemed motivated to get better, especially for her dog and cat at home and to get back to her garden.  CSW will speak with pt's dtr and to continue to follow and assist as needed.  Theresea Trautmann, Silvestre Mesi 04/10/2018, 12:06 AM

## 2018-04-10 NOTE — Progress Notes (Signed)
Patient has been restless during the shift. Multiple calls to the front desk. Many times, she did not know what she wanted. Continuously asking for her covers to be straightened when they already are, asking for pain medicine without showing any signs of pain, asking for water, asking what we're doing, etc She removed the dressing to her right groin. A new dressing was placed & she removed it again. She slept from approximately 10pm-2am, then stated that she could not sleep & wanted ([place]) to come into her room. At one point, she started banging her call bell on the siderail. No acute distress noted. She was given honey thickened water multiple times during the shift. She was changed & had 2 small bowel movements. Her laxatives were not given due to her having multiple bowel movements during the previous shift. She stated that her perineal area was sore, skin barrier was applied. At this time, she is still using the call bell & having staff come into & out of her room without any needs presented. No acute distress noted. Will continue to monitor

## 2018-04-10 NOTE — Progress Notes (Signed)
Patient is asleep at this time.  

## 2018-04-10 NOTE — Progress Notes (Signed)
Patient exhibits the same type of behaviors, frequent calls, frequent requests, etc. While standing in front of her, she switches back & forth between requests, not allowing for the completion of one task. Asks to be turned & then when turned, pushes herself back into the same position & asks to be turned again. Kicks & knocks pillows off the bed, pulls at the k pad tubing, pulls herself so that she is laying across the bed, pulls her blankets off, then complains that she's cold & asks to be covered up, pulls it off again, swings her feet out of the bed, and then screams out for help. She does this even when someone is in the room with her. Reasoning is not effective. She was given thickened water a few times already this shift. Twice she drank the water from the cup & spit it back up. No acute distress noted, will continue to monitor. Some of her medications were changed today. Will continue to monitor.

## 2018-04-10 NOTE — Progress Notes (Signed)
Occupational Therapy Session Note  Patient Details  Name: Jessica Meyer MRN: [id number] Date of Birth: [birth year]  Today's Date: 04/10/2018 OT Individual Time: 0704-0800 OT Individual Time Calculation (min): 56 min    Short Term Goals: Week 1:  OT Short Term Goal 1 (Week 1): Pt will maintain static sitting balance EOB with mod A for 1 minute during ADL task OT Short Term Goal 2 (Week 1): Pt will attend to L visual field for ~30 seconds with moderate verbal cueing  OT Short Term Goal 3 (Week 1): Pt will initiate giving caregiver instructions with L UE positioning  OT Short Term Goal 4 (Week 1): Pt will don shirt at bed level with mod A  Skilled Therapeutic Interventions/Progress Updates:    Upon entering the room, pt just returning from x ray and agreeable to OT intervention. Pt incontinent of bladder and rolling L <> R with +2 assist for hygiene and clothing management. Supine >sit with total A to EOB. Pt sitting on EOB for static sitting balance with max - total A secondary to pushing and L lateral lean. Total A to don pull over shirt while second helper assisted with balance. Slide board transfer with total A and second helper to steady equipment. Once in tilt in space, pt requesting assistance to comb hair. She brushed hair a few times and then refused to continue. Large spots of matted hair this session that therapist assisted pt with detangling. OT placed pt in front of mirror for visual feedback secondary to L lateral lean in chair but unable to redirect and get pt to attend even after max multimodal cuing. OT repositioned pt in wheelchair and pt remained with NT to assist with breakfast tray.   Therapy Documentation Precautions:  Precautions Precautions: Fall Precaution Comments: left inattention and right gaze, increased extensor tone LLE and Flexion tone LUE at shoulder and elbow Restrictions Weight Bearing Restrictions: No   Pain: Pain Assessment Pain Scale: 0-10 Pain Score:  10-Worst pain ever Faces Pain Scale: Hurts a little bit Pain Location: Back Pain Frequency: Intermittent Pain Onset: Gradual Pain Intervention(s): Medication (See eMAR) ADL: ADL Eating: Maximal assistance Where Assessed-Eating: Bed level Grooming: Moderate assistance Where Assessed-Grooming: Bed level Upper Body Bathing: Maximal assistance Where Assessed-Upper Body Bathing: Bed level Lower Body Bathing: Maximal assistance Where Assessed-Lower Body Bathing: Bed level Upper Body Dressing: Maximal assistance Where Assessed-Upper Body Dressing: Bed level Lower Body Dressing: Dependent Where Assessed-Lower Body Dressing: Bed level Toileting: Dependent Where Assessed-Toileting: Bed level Toilet Transfer: Unable to assess   Therapy/Group: Individual Therapy  Alen Bleacher 04/10/2018, 9:51 AM

## 2018-04-10 NOTE — IPOC Note (Signed)
Overall Plan of Care [facility]) Patient Details Name: Jessica Meyer MRN: [id number] DOB: 30-Nov-1935  Admitting Diagnosis: <principal problem not specified>  Hospital Problems: Active Problems:   Right middle cerebral artery stroke [facility])     Functional Problem List: Nursing Behavior, Motor, Bladder, Nutrition, Bowel, Pain, Endurance, Medication Management, Sensory, Safety, Perception  PT Balance, Behavior, Endurance, Motor, Pain, Safety, Sensory, Perception, Skin Integrity  OT Balance, Vision, Pain, Cognition, Perception, Safety, Endurance, Sensory, Motor  SLP Cognition, Perception, Safety, Nutrition  TR         Basic ADL's: OT Eating, Grooming, Bathing, Dressing, Toileting     Advanced  ADL's: OT       Transfers: PT Bed Mobility, Bed to Chair, Car, [employer], Civil Service fast streamer, Research scientist (life sciences): PT Ambulation, Psychologist, prison and probation services, Stairs     Additional Impairments: OT Fuctional Use of Upper Extremity  SLP Swallowing, Social Cognition   Problem Solving, Memory, Attention, Awareness  TR      Anticipated Outcomes Item Anticipated Outcome  Self Feeding set up   Swallowing  Min A   Basic self-care  min A  Toileting  min A   Bathroom Transfers min A  Bowel/Bladder  mod I  Transfers  min assist  Locomotion  mod assist  Communication     Cognition  Min A   Pain  less than 2  Safety/Judgment  mod I   Therapy Plan: PT Intensity: Minimum of 1-2 x/day ,45 to 90 minutes PT Frequency: 5 out of 7 days PT Duration Estimated Length of Stay: 4 weeks OT Intensity: Minimum of 1-2 x/day, 45 to 90 minutes OT Frequency: 5 out of 7 days OT Duration/Estimated Length of Stay: 4 weeks SLP Intensity: Minumum of 1-2 x/day, 30 to 90 minutes SLP Frequency: 3 to 5 out of 7 days SLP Duration/Estimated Length of Stay: 4 weeks    Team Interventions: Nursing Interventions Patient/Family Education, Bowel Management, Bladder Management, Disease Management/Prevention,  Pain Management, Medication Management, Cognitive Remediation/Compensation, Dysphagia/Aspiration Precaution Training, Discharge Planning, Psychosocial Support  PT interventions Ambulation/gait training, Balance/vestibular training, Cognitive remediation/compensation, Discharge planning, Community reintegration, Functional mobility training, Skin care/wound management, UE/LE Strength taining/ROM, Neuromuscular re-education, Splinting/orthotics, UE/LE Coordination activities, Psychosocial support, Therapeutic Exercise, Functional electrical stimulation, DME/adaptive equipment instruction, Patient/family education, Therapeutic Activities, Wheelchair propulsion/positioning, Visual/perceptual remediation/compensation, Stair training, Pain management, Disease management/prevention  OT Interventions Warden/ranger, Community reintegration, Disease mangement/prevention, Development worker, international aid stimulation, Neuromuscular re-education, Patient/family education, Self Care/advanced ADL retraining, Splinting/orthotics, Therapeutic Exercise, UE/LE Coordination activities, Wheelchair propulsion/positioning, Visual/perceptual remediation/compensation, Therapeutic Activities, Skin care/wound managment, Psychosocial support, Functional mobility training, Discharge planning, Cognitive remediation/compensation, DME/adaptive equipment instruction, Pain management, UE/LE Strength taining/ROM  SLP Interventions Cognitive remediation/compensation, Cueing hierarchy, Internal/external aids, Therapeutic Activities, Patient/family education, Functional tasks  TR Interventions    SW/CM Interventions Discharge Planning, Psychosocial Support, Patient/Family Education   Barriers to Discharge MD  Medical stability, Behavior and spasticity  Nursing Other (comments) per pt she lives alone  PT Inaccessible home environment, Decreased caregiver support    OT      SLP Decreased caregiver support    SW       Team Discharge  Planning: Destination: PT-Home ,OT- Home , SLP-Home Projected Follow-up: PT-Home health PT, OT-  Home health OT, SLP-24 hour supervision/assistance, Home Health SLP, Outpatient SLP Projected Equipment Needs: PT-To be determined, OT- To be determined, SLP-To be determined Equipment Details: PT- , OT-  Patient/family involved in discharge planning: PT- Patient,  OT-Patient, Family member/caregiver, SLP-Patient  MD ELOS: 19-22d Medical Rehab  Prognosis:  Good Assessment:  83 year old right handed female history of hypertension, hyperlipidemia, diabetes mellitus, bilateral total knee replacement. Per chart review and family, patient lives alone. Independent prior to admission. One level home with 3 steps to entry. She performed her own ADLs as well as housework and cooking. She does not drive. Presented 04/04/2018 with fall and left-sided weakness. No loss of consciousness. CT of the head showed asymmetric hyperattenuation right MCA in the sylvian fissure. CT angiogram of head and neck showed occluded right ICA with trace reconstitution. Critical stenosis left ICA origin. Severe stenosis of bilateral vertebral artery origins, patent vertebral arteries. CT perfusion scan showed a large area right MCA penumbra with 9 mL right basal ganglia and possible temporal lobe cortical infarction. MRI brain reviewed, showing bilateral infarcts, >moderate to large right MCA infarct.  Per report, right MCA infarction with additional punctate acute infarctions cerebellum bilaterally. Attempts at mechanical thrombectomy unsuccessful. Echocardiogram with ejection fraction of 65 % hyperdynamic systolic function.No source of embolus. Neurology follow-up maintained on aspirin 81 mg daily and Plavix 3 months and Plavix alone.  Hospital complicated by dysphagia #1 nectar thick liquid   Now requiring 24/7 Rehab RN,MD, as well as CIR level PT, OT and SLP.  Treatment team will focus on ADLs and mobility with goals set at Min-Mod  A  See Team Conference Notes for weekly updates to the plan of care

## 2018-04-10 NOTE — Progress Notes (Signed)
Physical Therapy Session Note  Patient Details  Name: Jessica Meyer MRN: [id number] Date of Birth: 13-Feb-1935  Today's Date: 04/10/2018 PT Individual Time: 6962-9528 PT Individual Time Calculation (min): 43 min   Short Term Goals: Week 1:  PT Short Term Goal 1 (Week 1): Pt will perform bed<>chair transfer with Max assist +1 PT Short Term Goal 2 (Week 1): Pt will initiate gait training PT Short Term Goal 3 (Week 1): Pt will maintain static/dynamic sitting balance with CGA-min assist during functional tasks  Skilled Therapeutic Interventions/Progress Updates:    Pt received sitting in TIS w/c upon therapist arrival and agreeable to therapy session. Pt requesting to use bathroom. Performed lateral scoot transfer using transfer board with max to total assist for lifting and pivoting with cuing for sequencing and pt demonstrating mild pushing with R UE. Performed sit to supine with max assist for B LE management with cuing for use of R UE to assist with eccentric trunk control. Pt rolled R and L using bedrails with mod to max assist to reach full rolling and total assist for LB clothing management - cuing throughout for sequencing and manual facilitation for placement of L hemibody to assist with mobility. Therapist placed bed pan - pt able to pass gas but no BM or urination. Pt rolled R and L as described above for total assist removal of bed ban and max assist for LB clothing management with pt using R UE to assist - pt able to minimally lift bottom via bridge with therapist manually facilitating placement of L LE to complete LB dressing. Therapist performed L LE PROM to decrease extensor tone with dorsiflexion stretch 2 x 30 seconds with pt noted unable to reach neutral ankle alignment when in full knee extension due to tone. Pt performed supine to sit x2 with max assist for B LE management and trunk control with cuing for sequencing via logroll and use of R UE to assist with trunk upright. Pt performed  static seated balance task on EOB with max assist to prevent posterior LOB with max multimodal cuing for anterior and R lateral trunk lean to obtain midline as well as cuing for increased upright posture - pt progressed to being able to sit for ~5 seconds with close supervision prior to posterior LOB requiring max assist for recovery. Performed sit to supine with max assist for B LE management and max assist for posterior supine scoot in bed for improved positioning - cuing for use of B UEs on bedrails to assist with supine scoot. Pt left supine in bed with needs in reach and bed alarm on.  Therapy Documentation Precautions:  Precautions Precautions: Fall Precaution Comments: left inattention and right gaze, increased extensor tone LLE and Flexion tone LUE at shoulder and elbow Restrictions Weight Bearing Restrictions: No  Pain: Pt reporting generalized discomfort upon arrival to room but no further complaints throughout therapy session.   Therapy/Group: Individual Therapy  Ginny Forth, PT, DPT 04/10/2018, 1:16 PM

## 2018-04-10 NOTE — Progress Notes (Signed)
Patient is still pressing the call light. Now she is asking for something to help her sleep. She was informed that it is too late to have anything for sleep. Then she stated that she wanted to get up. After she was told this & the nurse tech left the room, she called back twice. She has not slept since 2am. Will continue to monitor.

## 2018-04-10 NOTE — Progress Notes (Signed)
Physical Therapy Session Note  Patient Details  Name: Jessica Meyer MRN: [id number] Date of Birth: 1935/09/05  Today's Date: 04/10/2018 PT Individual Time: 1335-1435 PT Individual Time Calculation (min): 60 min   Short Term Goals: Week 1:  PT Short Term Goal 1 (Week 1): Pt will perform bed<>chair transfer with Max assist +1 PT Short Term Goal 2 (Week 1): Pt will initiate gait training PT Short Term Goal 3 (Week 1): Pt will maintain static/dynamic sitting balance with CGA-min assist during functional tasks  Skilled Therapeutic Interventions/Progress Updates: Pt presented in bed agreeable to therapy. Pt stating HA and RLE pain nsg notified and received pain meds during session. Pt required maxA x 1 supine to sit with max cues for sequencing and assist for truncal support against posterior pushing and LLE placement. Pt performed SB transfer to L in TIS requiring maxA x 2 with PTA positioning to discourage pushing. Pt transported to day room and participated in standing frame x 10 min. Use of mirror for visual feedback in conjunction with manual facilitation to try to attain midline. Pt stating able to see self in mirror however unable to maintain visual field or attention to maintain orientation for more than 1-2 seconds. Pt performed SB transfer to R maxA and participated in sitting balance with mirror feedback. Pt required max cues and facilitation for forward lean and repositioning against L pushing. Pt only able to maintain sitting balance for a few seconds despite max multimodal cues. Pt transferred back to TIS chair max to total A x 2. Pt transported back to room and agreeable to remain in TIS. Pt left with belt alarm on, call bell within reach and phone call placed to [employer].      Therapy Documentation Precautions:  Precautions Precautions: Fall Precaution Comments: left inattention and right gaze, increased extensor tone LLE and Flexion tone LUE at shoulder and  elbow Restrictions Weight Bearing Restrictions: No General:   Vital Signs: Therapy Vitals Temp: (!) 97.4 F (36.3 C) Pulse Rate: 77 Resp: 17 BP: (!) 127/53 Patient Position (if appropriate): Sitting Oxygen Therapy SpO2: 95 % O2 Device: Room Air Pain: Pain Assessment Pain Score: 0-No pain  Therapy/Group: Individual Therapy  Dakari Stabler  Delaila Nand, PTA  04/10/2018, 2:50 PM

## 2018-04-11 ENCOUNTER — Inpatient Hospital Stay (HOSPITAL_COMMUNITY): Payer: Medicare Other | Admitting: Occupational Therapy

## 2018-04-11 ENCOUNTER — Inpatient Hospital Stay (HOSPITAL_COMMUNITY): Payer: Medicare Other

## 2018-04-11 ENCOUNTER — Inpatient Hospital Stay (HOSPITAL_COMMUNITY): Payer: Medicare Other | Admitting: Speech Pathology

## 2018-04-11 ENCOUNTER — Inpatient Hospital Stay (HOSPITAL_COMMUNITY): Payer: Medicare Other | Admitting: Physical Therapy

## 2018-04-11 LAB — GLUCOSE, CAPILLARY
Glucose-Capillary: 148 mg/dL — ABNORMAL HIGH (ref 70–99)
Glucose-Capillary: 195 mg/dL — ABNORMAL HIGH (ref 70–99)
Glucose-Capillary: 114 mg/dL — ABNORMAL HIGH (ref 70–99)
Glucose-Capillary: 223 mg/dL — ABNORMAL HIGH (ref 70–99)

## 2018-04-11 MED ORDER — BUPROPION HCL ER (XL) 300 MG PO TB24: 300.0000 mg | ORAL_TABLET | Freq: Every day | ORAL | Status: DC

## 2018-04-11 NOTE — Plan of Care (Signed)
  Problem: Consults Goal: RH STROKE PATIENT EDUCATION Description See Patient Education module for education specifics  Outcome: Progressing Goal: Nutrition Consult-if indicated Outcome: Progressing Goal: Diabetes Guidelines if Diabetic/Glucose > 140 Description If diabetic or lab glucose is > 140 mg/dl - Initiate Diabetes/Hyperglycemia Guidelines & Document Interventions  Outcome: Progressing   Problem: RH BOWEL ELIMINATION Goal: RH STG MANAGE BOWEL WITH ASSISTANCE Description STG Manage Bowel with Assistance. Outcome: Progressing Goal: RH STG MANAGE BOWEL W/MEDICATION W/ASSISTANCE Description STG Manage Bowel with Medication with Assistance. Outcome: Progressing   Problem: RH BLADDER ELIMINATION Goal: RH STG MANAGE BLADDER WITH ASSISTANCE Description STG Manage Bladder With Assistance Outcome: Progressing Goal: RH STG MANAGE BLADDER WITH MEDICATION WITH ASSISTANCE Description STG Manage Bladder With Medication With Assistance. Outcome: Progressing Goal: RH STG MANAGE BLADDER WITH EQUIPMENT WITH ASSISTANCE Description STG Manage Bladder With Equipment With Assistance Outcome: Progressing   Problem: RH SKIN INTEGRITY Goal: RH STG SKIN FREE OF INFECTION/BREAKDOWN Outcome: Progressing   Problem: RH SAFETY Goal: RH STG ADHERE TO SAFETY PRECAUTIONS W/ASSISTANCE/DEVICE Description STG Adhere to Safety Precautions With Assistance/Device. Outcome: Progressing   Problem: RH PAIN MANAGEMENT Goal: RH STG PAIN MANAGED AT OR BELOW PT'S PAIN GOAL Outcome: Progressing   Problem: RH KNOWLEDGE DEFICIT Goal: RH STG INCREASE KNOWLEDGE OF DIABETES Outcome: Progressing Goal: RH STG INCREASE KNOWLEDGE OF HYPERTENSION Outcome: Progressing Goal: RH STG INCREASE KNOWLEDGE OF DYSPHAGIA/FLUID INTAKE Outcome: Progressing Goal: RH STG INCREASE KNOWLEGDE OF HYPERLIPIDEMIA Outcome: Progressing Goal: RH STG INCREASE KNOWLEDGE OF STROKE PROPHYLAXIS Outcome: Progressing   Problem: RH  Vision Goal: RH LTG Vision (Specify) Outcome: Progressing

## 2018-04-11 NOTE — Patient Care Conference (Signed)
Inpatient RehabilitationTeam Conference and Plan of Care Update Date: 04/11/2018   Time: 11:20 AM    Patient Name: Jessica Meyer      Medical Record Number: [id number]  Date of Birth: 03/28/1935 Sex: Female         Room/Bed: 4W16C/4W16C-01 Payor Info: Payor: MEDICARE / Plan: MEDICARE PART A AND B / Product Type: *No Product type* /    Admitting Diagnosis: CVA  Admit Date/Time:  04/07/2018  1:53 PM Admission Comments: No comment available   Primary Diagnosis:  <principal problem not specified> Principal Problem: <principal problem not specified>  Patient Active Problem List   Diagnosis Date Noted  . Right middle cerebral artery stroke (HCC) 04/07/2018  . Left-sided weakness   . CKD (chronic kidney disease), stage III (HCC)   . Acute blood loss anemia   . Hypothyroidism   . Dyslipidemia   . Diabetes mellitus type 2 in nonobese (HCC)   . Dysphagia, post-stroke   . Dysarthria, post-stroke   . Middle cerebral artery embolism, right 04/05/2018  . Stroke (cerebrum) (HCC) 04/04/2018  . Atypical chest pain 11/10/2017  . CKD stage G3b/A1, GFR 30-44 and albumin creatinine ratio <30 mg/g (HCC) 08/10/2016  . Left hip pain 06/14/2016  . Type 2 diabetes mellitus with stage 3 chronic kidney disease, with long-term current use of insulin (HCC) 01/29/2016  . Leukocytosis 01/25/2016  . Closed fracture of right hip (HCC)   . Fall   . Left knee pain 05/28/2015  . Sprain of hand, third finger, right 05/28/2015  . HTN (hypertension) 07/29/2014  . Gait instability 07/29/2014  . Hyperlipidemia 07/29/2014  . Right knee pain 04/11/2014  . ASNHL (asymmetrical sensorineural hearing loss) 08/07/2013  . 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 Hernia, rectovaginal 08/07/2013  . Hypercholesterolemia without hypertriglyceridemia 08/07/2013  . Arthritis of knee, degenerative 08/07/2013  . Degenerative arthritis of lumbar spine 08/07/2013  . Osteopenia 08/07/2013  . Adult hypothyroidism 08/07/2013  . History of  artificial joint 08/07/2013  . Essential (primary) hypertension 08/07/2013  . Depression with anxiety 08/07/2013    Expected Discharge Date: Expected Discharge Date: (3-4 weeks)  Team Members Present: Physician leading conference: Dr. Claudette Laws Social Worker Present: Staci Acosta, LCSW Nurse Present: Doran Durand, LPN PT Present: Woodfin Ganja, PT OT Present: Perrin Maltese, OT SLP Present: Colin Benton, SLP PPS Coordinator present : Fae Pippin     Current Status/Progress Goal Weekly Team Focus  Medical   max- total due to severe left neglect, incont, poor attention  reduce fall risk, reduce pushing, reduce bowel adn bladder incont  improve aware of Left side   Bowel/Bladder   incontinent of bowel & bladder,  04/10/18  incontinence management  timed toileting   Swallow/Nutrition/ Hydration   dys 1 and HTL   Min A  trials of NTL with chin tuck with improved cognition, swallow strategies   ADL's   max - total A of +2 slide board transfer, max - total A static sitting on EOB, L inattention  min A overall   L NMR, self care, visual scanning, attention to task, functional transfer   Mobility   maxA bed mobility, maxA to total A x 2 SB transfer, maxA to total A sitting balance, L extensor tone L inattention, pusher  MinA w/c level  sitting balance, L NMR, transfers, d/c planning    Communication             Safety/Cognition/ Behavioral Observations  Max-Mod A  Min A  basic problem  solving, sustained , left scanning    Pain   c/o pain to various areas legs, back, neck, shoulders, head, always says pain is 10/10 with no visual signs, has tylenol & tylenol #3 Q4prn  pain scale <4 or nno signs of pain or discomfort  assess & treat as needed   Skin   rt groin incision, patient will not leave on drsg  no new skin breakdown  assess q shift    Rehab Goals Patient on target to meet rehab goals: Yes Rehab Goals Revised: none *See Care Plan and progress notes for long  and short-term goals.     Barriers to Discharge  Current Status/Progress Possible Resolutions Date Resolved   Physician    Neurogenic Bowel & Bladder;Medical stability     minimal progress, very inconsistent  cont rehab, work on safe swallow      Nursing  Behavior  needs constant supervision, removes therapies, attention defecit, fixation, memory deficits, impulsive behaviors            PT  Inaccessible home environment;Decreased caregiver support                 OT                  SLP Decreased caregiver support              SW                Discharge Planning/Teaching Needs:  Per Admissions Coordinator, pt to go to dtr's home where she and her family and paid caregivers will care for pt.  Pt told CSW this was to occur at her house.  CSW to confirm with pt's dtr.  Family education to occur closer to d/c.   Team Discussion:  Pt with large R MCA infarct and has poor awareness of deficits and severe left neglect.  Pt has new pains each day, but Dr. Wynn Banker does not want to sedate her too much, so he's suggesting we try tylenol for pain.  Pt is incontinent of bladder and bowel.  She sometimes has restless nights, but slept well last night.  Pt is max to total A +2 for self care due to severe left neglect.  She cannot attend to task or follow commands.  Transfers are not consistent and pt pushes hard during them.  Pt needs max A during sitting and max +2 for slide board txs with total A for standing.  Pt had MBS on Monday and was advanced to honey thick liquids and D1 purees, but coughed today with eating, so Courtney, ST, to observe today.  Pt is mod to max for cognition; attention and memory vary.  ST working on basic problem solving.  Revisions to Treatment Plan:  none    Continued Need for Acute Rehabilitation Level of Care: The patient requires daily medical management by a physician with specialized training in physical medicine and rehabilitation for the following  conditions: Daily direction of a multidisciplinary physical rehabilitation program to ensure safe treatment while eliciting the highest outcome that is of practical value to the patient.: Yes Daily medical management of patient stability for increased activity during participation in an intensive rehabilitation regime.: Yes Daily analysis of laboratory values and/or radiology reports with any subsequent need for medication adjustment of medical intervention for : Neurological problems   I attest that I was present, lead the team conference, and concur with the assessment and plan of the team. Team conference was held  via web/ teleconference due to COVID - 19.   Shakeya Kerkman, Vista DeckJennifer Capps 04/11/2018, 11:36 AM

## 2018-04-11 NOTE — Progress Notes (Signed)
Occupational Therapy Session Note  Patient Details  Name: Jessica Meyer MRN: [id number] Date of Birth: [birth year]  Today's Date: 04/11/2018 OT Individual Time: 2263-3354 OT Individual Time Calculation (min): 75 min    Short Term Goals: Week 1:  OT Short Term Goal 1 (Week 1): Pt will maintain static sitting balance EOB with mod A for 1 minute during ADL task OT Short Term Goal 2 (Week 1): Pt will attend to L visual field for ~30 seconds with moderate verbal cueing  OT Short Term Goal 3 (Week 1): Pt will initiate giving caregiver instructions with L UE positioning  OT Short Term Goal 4 (Week 1): Pt will don shirt at bed level with mod A  Skilled Therapeutic Interventions/Progress Updates:    Upon entering the room, pt supine in bed and has been incontinent of bowel and bladder with total A for hygiene and LB clothing management. Pt rolling L <> R with max A and use of bed rails. Supine >sit with total A. Pt placed onto slideboard and pushing so hand board needing to be repositioned with second helper to help with transfer. Pt placed into bobath position with UEs in her lab and total A for transfer. Pt's breakfast available and pt needing max A to feed self for safety. OT also repositioning pt's head with each bite as she has L lateral lean while in wheelchair. Pt began coughing with liquids and food even when given small amounts from spoon. Meal stopped and SLP notified for safety. Pt remain in wheelchair with k pad for reported back pain, chair alarm belt donned, and chair tilted for safety. Call bell placed within reach. Pt needing max multimodal cuing to attend to task and initiate. Pt not following simple 1 step commands.   Therapy Documentation Precautions:  Precautions Precautions: Fall Precaution Comments: left inattention and right gaze, increased extensor tone LLE and Flexion tone LUE at shoulder and elbow Restrictions Weight Bearing Restrictions: No Vital Signs: Therapy  Vitals Temp: 97.6 F (36.4 C) Pulse Rate: 66 Resp: 18 BP: 132/61 Patient Position (if appropriate): Lying Oxygen Therapy SpO2: (!) 85 % O2 Device: Room Air ADL: ADL Eating: Maximal assistance Where Assessed-Eating: Bed level Grooming: Moderate assistance Where Assessed-Grooming: Bed level Upper Body Bathing: Maximal assistance Where Assessed-Upper Body Bathing: Bed level Lower Body Bathing: Maximal assistance Where Assessed-Lower Body Bathing: Bed level Upper Body Dressing: Maximal assistance Where Assessed-Upper Body Dressing: Bed level Lower Body Dressing: Dependent Where Assessed-Lower Body Dressing: Bed level Toileting: Dependent Where Assessed-Toileting: Bed level Toilet Transfer: Unable to assess   Therapy/Group: Individual Therapy  Alen Bleacher 04/11/2018, 5:02 PM

## 2018-04-11 NOTE — Progress Notes (Signed)
Physical Therapy Session Note  Patient Details  Name: Jessica Meyer MRN: [id number] Date of Birth: 01-Jan-1936  Today's Date: 04/11/2018 PT Individual Time: 1000-1100 PT Individual Time Calculation (min): 60 min   Short Term Goals: Week 1:  PT Short Term Goal 1 (Week 1): Pt will perform bed<>chair transfer with Max assist +1 PT Short Term Goal 2 (Week 1): Pt will initiate gait training PT Short Term Goal 3 (Week 1): Pt will maintain static/dynamic sitting balance with CGA-min assist during functional tasks  Skilled Therapeutic Interventions/Progress Updates: Pt presented in bed agreeable to therapy. Pt stating some pain in head and RLE however unable to rate. Pt indicating need to void, performed rolling L/R maxA for bed pain placement (-void). Pt was able to perform small bridge to lift hips to don/doff pants. Performed supine to sit maxA with significant posterior lean noted in conjunction with LLE extensor tone. Pt required maxA x  2 SB transfer to TIS chair. Cushion changed to Roho cushion prior to transfer as pt c/o pain in buttocks while sitting in chair. Pt transported to rehab gym and participated in sitting balance activities reaching to R with RUE to encourage R positioning. PTA provided mirror feedback to attain and correct positioning with pt able to achieve midline for several seconds before leaning. Pt unable to lean forward without tactile cues to place cards on board. PTA provided [facility] assist for reaching with RUE with pt able to minimally carry over with additional trials. Pt required intermittent seated rests due to fatigue. Pt required x 2 assist for repositioning due to pt shifting forward in TIS chair. Pt noted to have x1 occurrence of R elbow wedging into chair and pt pushing into chair. Pt then noted to have skin tear at elbow. Pt transported back to room and handed off to SLP with nsg notified regarding skin tear.         Therapy Documentation Precautions:   Precautions Precautions: Fall Precaution Comments: left inattention and right gaze, increased extensor tone LLE and Flexion tone LUE at shoulder and elbow Restrictions Weight Bearing Restrictions: No General:   Vital Signs:   Pain: Pain Assessment Pain Scale: 0-10 Pain Score: 10-Worst pain ever Faces Pain Scale: Hurts even more Pain Location: Back Pain Orientation: Mid Pain Descriptors / Indicators: Stabbing Pain Onset: Sudden Pain Intervention(s): Medication (See eMAR);RN made aware Multiple Pain Sites: No    Therapy/Group: Individual Therapy  Fardowsa Authier  Lashala Laser, PTA  04/11/2018, 1:39 PM

## 2018-04-11 NOTE — Progress Notes (Signed)
Patient slept better last night after her medications were given. Fluids given again this morning after her morning meds. She c/o back pain & tylenol was given. No acute distress noted. She asked to be turned to her left side & was. She yelled out into the hallway about 20 minutes after saying to straighten out her covers. Covers were straight but she had already turned onto her back. No acute distress noted.

## 2018-04-11 NOTE — Plan of Care (Signed)
  Problem: Consults Goal: RH STROKE PATIENT EDUCATION Description See Patient Education module for education specifics  04/11/2018 1530 by Kalman Shan, LPN Outcome: Progressing 04/11/2018 1442 by Doran Durand A, LPN Outcome: Progressing Goal: Nutrition Consult-if indicated 04/11/2018 1530 by Doran Durand A, LPN Outcome: Progressing 04/11/2018 1442 by Doran Durand A, LPN Outcome: Progressing Goal: Diabetes Guidelines if Diabetic/Glucose > 140 Description If diabetic or lab glucose is > 140 mg/dl - Initiate Diabetes/Hyperglycemia Guidelines & Document Interventions  04/11/2018 1530 by Doran Durand A, LPN Outcome: Progressing 04/11/2018 1442 by Doran Durand A, LPN Outcome: Progressing   Problem: RH BOWEL ELIMINATION Goal: RH STG MANAGE BOWEL WITH ASSISTANCE Description STG Manage Bowel with Assistance. 04/11/2018 1530 by Kalman Shan, LPN Outcome: Progressing 04/11/2018 1442 by Kalman Shan, LPN Outcome: Progressing Goal: RH STG MANAGE BOWEL W/MEDICATION W/ASSISTANCE Description STG Manage Bowel with Medication with Assistance. 04/11/2018 1530 by Kalman Shan, LPN Outcome: Progressing 04/11/2018 1442 by Doran Durand A, LPN Outcome: Progressing   Problem: RH BLADDER ELIMINATION Goal: RH STG MANAGE BLADDER WITH ASSISTANCE Description STG Manage Bladder With Assistance 04/11/2018 1530 by Kalman Shan, LPN Outcome: Progressing 04/11/2018 1442 by Kalman Shan, LPN Outcome: Progressing Goal: RH STG MANAGE BLADDER WITH MEDICATION WITH ASSISTANCE Description STG Manage Bladder With Medication With Assistance. 04/11/2018 1530 by Kalman Shan, LPN Outcome: Progressing 04/11/2018 1442 by Kalman Shan, LPN Outcome: Progressing Goal: RH STG MANAGE BLADDER WITH EQUIPMENT WITH ASSISTANCE Description STG Manage Bladder With Equipment With Assistance 04/11/2018 1530 by Kalman Shan, LPN Outcome: Progressing 04/11/2018 1442 by Doran Durand A, LPN Outcome: Progressing   Problem: RH SKIN INTEGRITY Goal: RH STG SKIN FREE OF INFECTION/BREAKDOWN 04/11/2018 1530 by Kalman Shan, LPN Outcome: Progressing 04/11/2018 1442 by Doran Durand A, LPN Outcome: Progressing   Problem: RH SAFETY Goal: RH STG ADHERE TO SAFETY PRECAUTIONS W/ASSISTANCE/DEVICE Description STG Adhere to Safety Precautions With Assistance/Device. 04/11/2018 1530 by Kalman Shan, LPN Outcome: Progressing 04/11/2018 1442 by Doran Durand A, LPN Outcome: Progressing   Problem: RH PAIN MANAGEMENT Goal: RH STG PAIN MANAGED AT OR BELOW PT'S PAIN GOAL 04/11/2018 1530 by Kalman Shan, LPN Outcome: Progressing 04/11/2018 1442 by Doran Durand A, LPN Outcome: Progressing   Problem: RH KNOWLEDGE DEFICIT Goal: RH STG INCREASE KNOWLEDGE OF DIABETES 04/11/2018 1530 by Kalman Shan, LPN Outcome: Progressing 04/11/2018 1442 by Doran Durand A, LPN Outcome: Progressing Goal: RH STG INCREASE KNOWLEDGE OF HYPERTENSION 04/11/2018 1530 by Doran Durand A, LPN Outcome: Progressing 04/11/2018 1442 by Doran Durand A, LPN Outcome: Progressing Goal: RH STG INCREASE KNOWLEDGE OF DYSPHAGIA/FLUID INTAKE 04/11/2018 1530 by Doran Durand A, LPN Outcome: Progressing 04/11/2018 1442 by Doran Durand A, LPN Outcome: Progressing Goal: RH STG INCREASE KNOWLEGDE OF HYPERLIPIDEMIA 04/11/2018 1530 by Doran Durand A, LPN Outcome: Progressing 04/11/2018 1442 by Doran Durand A, LPN Outcome: Progressing Goal: RH STG INCREASE KNOWLEDGE OF STROKE PROPHYLAXIS 04/11/2018 1530 by Kalman Shan, LPN Outcome: Progressing 04/11/2018 1442 by Kalman Shan, LPN Outcome: Progressing   Problem: RH Vision Goal: RH LTG Vision (Specify) 04/11/2018 1530 by Kalman Shan, LPN Outcome: Progressing 04/11/2018 1442 by Kalman Shan, LPN Outcome: Progressing

## 2018-04-11 NOTE — Progress Notes (Signed)
Speech Language Pathology Daily Session Note  Patient Details  Name: Jessica Meyer MRN: [id number] Date of Birth: May 18, 1935  Today's Date: 04/11/2018 SLP Individual Time: 1100-1200 SLP Individual Time Calculation (min): 60 min  Short Term Goals: Week 1: SLP Short Term Goal 1 (Week 1): Pt will demonstrate sustained attention to task for ~ 15 minutes with Min A cues.  SLP Short Term Goal 2 (Week 1): Pt will scan to left of midline to locate items in 8 out of 10 opportunities with Max A cues.  SLP Short Term Goal 3 (Week 1): Pt will complete basic familiar problem solving tasks with Mod A cues.  SLP Short Term Goal 4 (Week 1): Pt will consume current diet with minimal s/s of aspiration and Mod A cues for use of compensatory swallow strategies.  SLP Short Term Goal 5 (Week 1): Pt will consume trials of nectar thick liquids with minimal overt s/s of aspiration with Max A cues for use of compensatory swallow strategies to indicate readiness for instrumental study.   Skilled Therapeutic Interventions:  Patient received skilled SLP services targeting cognitive deficits and therapeutic diet tolerance due to concerns from staff of coughing with PO intake this morning. Patient demonstrates impulsivity attempting to take large consecutive sips of honey thick liquids resulting in a prolonged coughing episode x1. With a verbal cue to complete small single cup sips of honey thick liquids no overt s/sx of aspiration were observed. No overt s/sx of aspiration were observed with pureed consistency. Mild anterior loss was observed with honey thick liquids which patient was not sensate to requiring verbal cue to wipe her mouth. Due to impulsivity with PO intake patient would continue to benefit from 1:1 supervision and assistance with meals. Patient participated in visual scanning task placing sets of 3 pictures in the order they occur requiring max verbal cues to scan to the left with 80% accuracy. Patient  participated in attention task sorting deck of cards by red and black with 100% accuracy following mod verbal cues to attend to task. Patient responded to functional problem solving scenarios for the home environment with 50% accuracy following max verbal cues. Patient demonstrates reduced insight into deficits believing she is safe to go home by herself. At end of therapy session patient upright in chair, chair alarm in place, and call bell within reach. Patient would continue to benefit from skilled SLP services.  Pain Pain Assessment Pain Scale: 0-10 Pain Score: 10-Worst pain ever Faces Pain Scale: Hurts even more Pain Location: Back Pain Orientation: Mid Pain Descriptors / Indicators: Stabbing Pain Onset: Sudden Pain Intervention(s): Medication (See eMAR);RN made aware Multiple Pain Sites: No  Therapy/Group: Individual Therapy  Arnette Schaumann, MS, CCC-SLP  04/11/2018, 12:09 PM

## 2018-04-11 NOTE — Progress Notes (Signed)
[facility] PHYSICAL MEDICINE & REHABILITATION PROGRESS NOTE  Subjective/Complaints:  Resting in bed, no neck or flank pain , had some knee pain  ROS: Denies CP, shortness of breath, nausea, vomiting, diarrhea.  Objective: Vital Signs: Blood pressure 137/61, pulse 67, temperature 98.4 F (36.9 C), temperature source Oral, resp. rate 18, height 5' 5"  (1.651 m), weight 66.2 kg, SpO2 94 %. Dg Chest 2 View  Result Date: 04/10/2018 CLINICAL DATA:  Right side weakness. History of stroke. Question aspiration. EXAM: CHEST - 2 VIEW COMPARISON:  Single-view of the chest 01/25/2016. FINDINGS: Minimal atelectasis is seen in the left lung base. The lungs are otherwise clear. Heart size is upper normal. Aortic atherosclerosis is noted. No pneumothorax or pleural effusion. No acute or focal bony abnormality. IMPRESSION: Negative for aspiration.  No acute disease. Atherosclerosis. Electronically Signed   By: Inge Rise M.D.   On: 04/10/2018 08:24   Dg Swallowing Func-speech Pathology  Result Date: 04/09/2018 Objective Swallowing Evaluation: Type of Study: Bedside Swallow Evaluation  Patient Details Name: Jessica Meyer MRN: [id number] Date of Birth: 08-20-1935 Today's Date: 04/09/2018 Time: SLP Start Time (ACUTE ONLY): 1057 -SLP Stop Time (ACUTE ONLY): 1121 SLP Time Calculation (min) (ACUTE ONLY): 24 min Past Medical History: Past Medical History: Diagnosis Date . Arthritis  . Diabetes mellitus without complication ([place])  . History of chicken pox  . Hyperlipidemia  . Hypertension  . Thyroid disease   hypothyroid Past Surgical History: Past Surgical History: Procedure Laterality Date . ABDOMINAL HYSTERECTOMY   . HIP ARTHROPLASTY Right 01/26/2016  Procedure: ARTHROPLASTY BIPOLAR HIP (HEMIARTHROPLASTY);  Surgeon: Renette Butters, MD;  Location: [facility];  Service: Orthopedics;  Laterality: Right; . IR ANGIO EXTRACRAN SEL COM CAROTID INNOMINATE UNI L MOD SED  04/05/2018 . IR CT HEAD LTD  04/05/2018 . IR PERCUTANEOUS ART  THROMBECTOMY/INFUSION INTRACRANIAL INC DIAG ANGIO  04/05/2018 . KNEE ARTHROSCOPY Bilateral  . RADIOLOGY WITH ANESTHESIA N/A 04/04/2018  Procedure: RADIOLOGY WITH ANESTHESIA;  Surgeon: Radiologist, Medication, MD;  Location: [facility];  Service: Radiology;  Laterality: N/A; . REPLACEMENT TOTAL KNEE BILATERAL    1998 left , 1992 right HPI: 83 y.o. female with a history of hypertension, hyperlipidemia, diabetes who presented with left-sided weakness and neglect. Pt with right MCA occlusion.   Bilateral common carotid arteriograms followed unsuccessful attempt at revascularization of Rt MCA occlusion due to prox Rt ICA occlusion despite multiple angioplasties. intubated for procedure only. Failed RN stroke swallow screen.  Subjective: alert but groggy Assessment / Plan / Recommendation CHL IP CLINICAL IMPRESSIONS 04/09/2018 Clinical Impression Pt with sensed aspiration of nectar thick liquids via spoon d/t incomplete glottal closure during swallow (PAS 7). Trials of liquid textures were limited d/t severity of coughing fits with aspiration. Trialed head turn to pt's left but resulted in incomplete turn and aspiration. Honey thick liquids via cup sips and puree were functional.  Would recommend trialing chin tuck with spoonfuls of nectar thick liquids at bedside to observe if that makes clinical difference in aspiration. Continue puree diet with downgrade to honey thick liquids, medicine crushed in applesauce and full supervision.  SLP Visit Diagnosis Dysphagia, pharyngeal phase (R13.13);Cognitive communication deficit (R41.841) Attention and concentration deficit following -- Frontal lobe and executive function deficit following -- Impact on safety and function Severe aspiration risk;Risk for inadequate nutrition/hydration   CHL IP TREATMENT RECOMMENDATION 04/09/2018 Treatment Recommendations Therapy as outlined in treatment plan below   No flowsheet data found. CHL IP DIET RECOMMENDATION 04/09/2018 SLP Diet Recommendations Dysphagia  1 (Puree)  solids;Honey thick liquids Liquid Administration via Cup Medication Administration Crushed with puree Compensations Slow rate;Minimize environmental distractions;Small sips/bites;Lingual sweep for clearance of pocketing Postural Changes Seated upright at 90 degrees   CHL IP OTHER RECOMMENDATIONS 04/09/2018 Recommended Consults -- Oral Care Recommendations Oral care BID Other Recommendations Order thickener from pharmacy   CHL IP FOLLOW UP RECOMMENDATIONS 04/06/2018 Follow up Recommendations Inpatient Rehab   CHL IP FREQUENCY AND DURATION 04/05/2018 Speech Therapy Frequency (ACUTE ONLY) min 2x/week Treatment Duration --      CHL IP ORAL PHASE 04/09/2018 Oral Phase WFL Oral - Pudding Teaspoon -- Oral - Pudding Cup -- Oral - Honey Teaspoon -- Oral - Honey Cup -- Oral - Nectar Teaspoon -- Oral - Nectar Cup -- Oral - Nectar Straw -- Oral - Thin Teaspoon -- Oral - Thin Cup -- Oral - Thin Straw -- Oral - Puree -- Oral - Mech Soft -- Oral - Regular -- Oral - Multi-Consistency -- Oral - Pill -- Oral Phase - Comment --  CHL IP PHARYNGEAL PHASE 04/09/2018 Pharyngeal Phase Impaired Pharyngeal- Pudding Teaspoon -- Pharyngeal -- Pharyngeal- Pudding Cup -- Pharyngeal -- Pharyngeal- Honey Teaspoon WFL Pharyngeal -- Pharyngeal- Honey Cup WFL Pharyngeal -- Pharyngeal- Nectar Teaspoon Reduced airway/laryngeal closure;Moderate aspiration;Penetration/Aspiration during swallow Pharyngeal Material enters airway, passes BELOW cords and not ejected out despite cough attempt by patient Pharyngeal- Nectar Cup -- Pharyngeal -- Pharyngeal- Nectar Straw -- Pharyngeal -- Pharyngeal- Thin Teaspoon -- Pharyngeal -- Pharyngeal- Thin Cup -- Pharyngeal -- Pharyngeal- Thin Straw -- Pharyngeal -- Pharyngeal- Puree -- Pharyngeal -- Pharyngeal- Mechanical Soft -- Pharyngeal -- Pharyngeal- Regular -- Pharyngeal -- Pharyngeal- Multi-consistency -- Pharyngeal -- Pharyngeal- Pill -- Pharyngeal -- Pharyngeal Comment --  CHL IP CERVICAL ESOPHAGEAL PHASE  04/09/2018 Cervical Esophageal Phase WFL Pudding Teaspoon -- Pudding Cup -- Honey Teaspoon -- Honey Cup -- Nectar Teaspoon -- Nectar Cup -- Nectar Straw -- Thin Teaspoon -- Thin Cup -- Thin Straw -- Puree -- Mechanical Soft -- Regular -- Multi-consistency -- Pill -- Cervical Esophageal Comment -- Jessica Meyer 04/09/2018, 2:06 PM              Recent Labs    04/09/18 0325  WBC 13.1*  HGB 10.0*  HCT 31.0*  PLT 208   Recent Labs    04/09/18 0325  NA 137  K 3.9  CL 106  CO2 22  GLUCOSE 144*  BUN 15  CREATININE 1.22*  CALCIUM 9.5    Physical Exam: BP 137/61 (BP Location: Right Arm)   Pulse 67   Temp 98.4 F (36.9 C) (Oral)   Resp 18   Ht 5' 5"  (1.651 m)   Wt 66.2 kg   SpO2 94%   BMI 24.29 kg/m  Constitutional: Well-developed.  Well-nourished.  NAD.  Vital signs reviewed. HENT: Normocephalic.  Atraumatic. Eyes: EOMI.  No discharge. Cardiovascular: No JVD. Respiratory: Effort normal  GI: Nondistended Musculoskeletal: no pain with LLE ROM, no left knee or ankle swelling Neurological: She is somnolent this a.m.  Left-sided neglect   Follows commands.    Poor insight into deficits Dysarthria Motor: LLE: 0/5 proximal to distal 0/5 left upper extremity unchanged RLE: 5/5 proximal to distal Sensation absent to light touch LLE  Severe left neglect Poor attention and concentration Skin: Skin is warm and dry.  Psychiatric: Her affect is blunt. Her speech is delayed.   Assessment/Plan: 1. Functional deficits secondary to right MCA infarct which require 3+ hours per day of interdisciplinary therapy in a comprehensive inpatient rehab setting.  Physiatrist  is providing close team supervision and 24 hour management of active medical problems listed below.  Physiatrist and rehab team continue to assess barriers to discharge/monitor patient progress toward functional and medical goals  Care Tool:  Bathing  Bathing activity did not occur: Safety/medical concerns            Bathing assist       Upper Body Dressing/Undressing Upper body dressing   What is the patient wearing?: Pull over shirt    Upper body assist Assist Level: 2 Helpers    Lower Body Dressing/Undressing Lower body dressing    Lower body dressing activity did not occur: Safety/medical concerns What is the patient wearing?: Incontinence brief, Pants     Lower body assist Assist for lower body dressing: 2 Helpers     Toileting Toileting    Toileting assist Assist for toileting: Total Assistance - Patient < 25%     Transfers Chair/bed transfer  Transfers assist  Chair/bed transfer activity did not occur: Safety/medical concerns  Chair/bed transfer assist level: Total Assistance - Patient < 25%(lateral scoot with transfer board)     Locomotion Ambulation   Ambulation assist   Ambulation activity did not occur: Safety/medical concerns          Walk 10 feet activity   Assist  Walk 10 feet activity did not occur: Safety/medical concerns        Walk 50 feet activity   Assist Walk 50 feet with 2 turns activity did not occur: Safety/medical concerns         Walk 150 feet activity   Assist Walk 150 feet activity did not occur: Safety/medical concerns         Walk 10 feet on uneven surface  activity   Assist Walk 10 feet on uneven surfaces activity did not occur: Safety/medical concerns         Wheelchair     Assist Will patient use wheelchair at discharge?: Yes Type of Wheelchair: Manual    Wheelchair assist level: Dependent - Patient 0%(tilt in space w/c) Max wheelchair distance: 150 ft    Wheelchair 50 feet with 2 turns activity    Assist        Assist Level: Dependent - Patient 0%   Wheelchair 150 feet activity     Assist     Assist Level: Dependent - Patient 0%      Medical Problem List and Plan: 1.  Left side weakness with dysphagia secondary to right MCA infarction in the setting of right ICA and right  MCA occlusion status post unsuccessful attempts at thrombectomy. Team conference today please see physician documentation under team conference tab, met with team face-to-face to discuss problems,progress, and goals. Formulized individual treatment plan based on medical history, underlying problem and comorbidities.2.  Antithrombotics: -DVT/anticoagulation:  SCDs             -antiplatelet therapy: aspirin 81 mg daily, Plavix 75 mg daily 3. Pain Management:  Tylenol 3 for headache as needed, flank pain has subsided, right-sided neck pain is the current complaint.  Will change Tylenol 3 to Tylenol for added Robaxin discontinue gabapentin.  Also add trolamine cream, patient tends to perseverate on one complaint which also makes pain assessment difficult 4. Mood: Wellbutrin 300 mg daily,Lexapro 5 mg daily at bedtime             -antipsychotic agents: N/A 5. Neuropsych: This patient is not fully capable of making decisions on her own behalf. 6. Skin/Wound Care:  Routine  skin checks 7. Fluids/Electrolytes/Nutrition:  Routine in and out's with follow-up chemistries tomorrow. 8. Dysphagia. Dysphagia #1 nectar liquids. Follow-up speech therapy             Advance diet as tolerated 9. Hypertension. Norvasc 5 mg daily,lisinopril 40 mg daily, Coreg 12.5 mg twice a day.   Monitor with increased mobility 10. Diabetes mellitus. Hemoglobin A1c 8.0. Glucotrol XL 2.5 mg daily, Lantus insulin 12 units daily at bedtime. Check blood sugars before meals and at bedtime.      CBG (last 3)  Recent Labs    04/10/18 1643 04/10/18 2105 04/11/18 0601  GLUCAP 167* 176* 148*  Blood sugar improved this morning we will continue to monitor 11. Hyperlipidemia. Lipitor 12. Hypothyroidism. Continue Synthroid 13.  Acute blood loss anemia:              Hemoglobin 9.3 on 4/4 follow-up tomorrow 14.  CKD stage III:             Creatinine 1.11 on 4/4. 1.22 on 4/6 15.  Leukocytosis-afeb Has urinary incont and Right flank pain,  now resolved UA negative Recheck in am    LOS: 4 days A FACE TO FACE EVALUATION WAS PERFORMED  Charlett Blake 04/11/2018, 9:01 AM

## 2018-04-11 NOTE — Progress Notes (Signed)
Physical Therapy Session Note  Patient Details  Name: Lilley Fleege MRN: [id number] Date of Birth: 1935/12/22  Today's Date: 04/11/2018  Short Term Goals: Week 1:  PT Short Term Goal 1 (Week 1): Pt will perform bed<>chair transfer with Max assist +1 PT Short Term Goal 2 (Week 1): Pt will initiate gait training PT Short Term Goal 3 (Week 1): Pt will maintain static/dynamic sitting balance with CGA-min assist during functional tasks  Skilled Therapeutic Interventions/Progress Updates:   Pt received supine in bed, asleep. PT unable to arouse pt to verbal or tactile stimuli. Pt left supine in bed with call bell in reach and all needs. PT will re-attempt PT at later time/date, provided pt in medically appropriate.      Therapy Documentation Precautions:  Precautions Precautions: Fall Precaution Comments: left inattention and right gaze, increased extensor tone LLE and Flexion tone LUE at shoulder and elbow Restrictions Weight Bearing Restrictions: No    Vital Signs: Therapy Vitals Temp: 97.6 F (36.4 C) Pulse Rate: 66 Resp: 18 BP: 132/61 Patient Position (if appropriate): Lying Oxygen Therapy SpO2: (!) 85 % O2 Device: Room Air    Therapy/Group: Individual Therapy  Golden Pop 04/11/2018, 4:10 PM

## 2018-04-12 ENCOUNTER — Inpatient Hospital Stay (HOSPITAL_COMMUNITY): Payer: Medicare Other | Admitting: Physical Therapy

## 2018-04-12 ENCOUNTER — Encounter (HOSPITAL_COMMUNITY): Payer: Self-pay

## 2018-04-12 ENCOUNTER — Inpatient Hospital Stay (HOSPITAL_COMMUNITY): Payer: Medicare Other | Admitting: Occupational Therapy

## 2018-04-12 ENCOUNTER — Inpatient Hospital Stay (HOSPITAL_COMMUNITY): Payer: Medicare Other | Admitting: Speech Pathology

## 2018-04-12 LAB — GLUCOSE, CAPILLARY
Glucose-Capillary: 166 mg/dL — ABNORMAL HIGH (ref 70–99)
Glucose-Capillary: 185 mg/dL — ABNORMAL HIGH (ref 70–99)
Glucose-Capillary: 177 mg/dL — ABNORMAL HIGH (ref 70–99)
Glucose-Capillary: 169 mg/dL — ABNORMAL HIGH (ref 70–99)

## 2018-04-12 MED ORDER — BUPROPION HCL 100 MG PO TABS
100.0000 mg | ORAL_TABLET | Freq: Three times a day (TID) | ORAL | Status: DC
  Administered 2018-04-12 – 2018-05-05 (×69): 100 mg via ORAL
  Filled 2018-04-12 (×71): qty 1

## 2018-04-12 NOTE — Progress Notes (Signed)
Orthopedic Tech Progress Note Patient Details:  Jessica Meyer 06-30-1935 [id number] Called in order to [facility] Patient ID: Jessica Meyer, female   DOB: 1935/10/27, 83 y.o.   MRN: [id number]   Jessica Meyer 04/12/2018, 9:54 AM

## 2018-04-12 NOTE — Progress Notes (Signed)
Speech Language Pathology Daily Session Note  Patient Details  Name: Jessica Meyer MRN: [id number] Date of Birth: 1935-06-20  Today's Date: 04/12/2018 SLP Individual Time: 0730-0830 SLP Individual Time Calculation (min): 60 min  Short Term Goals: Week 1: SLP Short Term Goal 1 (Week 1): Pt will demonstrate sustained attention to task for ~ 15 minutes with Min A cues.  SLP Short Term Goal 2 (Week 1): Pt will scan to left of midline to locate items in 8 out of 10 opportunities with Max A cues.  SLP Short Term Goal 3 (Week 1): Pt will complete basic familiar problem solving tasks with Mod A cues.  SLP Short Term Goal 4 (Week 1): Pt will consume current diet with minimal s/s of aspiration and Mod A cues for use of compensatory swallow strategies.  SLP Short Term Goal 5 (Week 1): Pt will consume trials of nectar thick liquids with minimal overt s/s of aspiration with Max A cues for use of compensatory swallow strategies to indicate readiness for instrumental study.   Skilled Therapeutic Interventions:  Skilled treatment session focused on dysphagia and cognition goals. SLP received pt asleep in bed with Max A multimodal cues required to arouse. Pt's right hearing aid present (daughter has left hearing aid). SLP placed in ear and provided glasses. Pt continues to present with overt severe cognitive impairments as evidenced by decreased perception of hearing aid in ear, inability to comprehend and retain that daughter has left hearing aid, inability to follow 1 step simple directions, reliably answer yes/no questions, language of confusion regarding items in room and significantly impaired left inattention. Despite Total A for bed mobility and positioning, pt demonstrates decreased postural support d/t pushing and as a result she frequently consumes POs in sub-optimal position. SLP facilitated session by providing skilled observation of pt consuming puree breakfast with honey thick liquids. Pt with cough x  1 when consuming large bolus of honey thick liquids. Pt unable to follow compensatory strategies for small sips, oral containment of bolus and for lingual sweep to clear trace to min left oral residue. SLP further facilitated session by reviewing and having pt press call light. Pt able to return demonstrate but is unable to retain and use after session. Pt left upright in bed, bed alarm on and all needs within reach. Continue per current plan of care.      Pain Pain Assessment Pain Scale: Faces Pain Score: 6  Faces Pain Scale: No hurt Pain Type: Acute pain Pain Location: Back Pain Orientation: Upper Pain Descriptors / Indicators: Aching Pain Frequency: Intermittent Pain Onset: Gradual Patients Stated Pain Goal: 4 Pain Intervention(s): Medication (See eMAR)  Therapy/Group: Individual Therapy  Mylik Pro 04/12/2018, 10:49 AM

## 2018-04-12 NOTE — Progress Notes (Signed)
Physical Therapy Session Note  Patient Details  Name: Jessica Meyer MRN: [id number] Date of Birth: [birth year]  Today's Date: 04/12/2018 PT Individual Time: 0910-1030 PT Individual Time Calculation (min): 80 min   Short Term Goals: Week 1:  PT Short Term Goal 1 (Week 1): Pt will perform bed<>chair transfer with Max assist +1 PT Short Term Goal 2 (Week 1): Pt will initiate gait training PT Short Term Goal 3 (Week 1): Pt will maintain static/dynamic sitting balance with CGA-min assist during functional tasks  Skilled Therapeutic Interventions/Progress Updates:   Pt received supine in bed reports small BM. rolling R and L with min assist to the L and max assist to the L for peri care, bed plan placement and to replace brief. Pt unable to perform additional BM on bed pan. Pt agreeable to PT, but poor awareness for need to perform OOB activity. Supine>sit transfer with total assist and due to extensor tone in the LLE with transfer to sitting. Sitting balance EOB to place stedy with max assist from PT. stedy +2 max assist transfer to [facility]. Pt ntoed to have keep LLE in flexion through transfer.   Pt transported to rehab gym in [facility]. stedy transfer with max A + 2 to mat table. Sitting balance EOB x with max assist overall to prevent L/posterior LOB. Pt able to maintain midline through WB on  Elbow with mod assist x 4 mninutes.   Pressure mapping to improve sitting position. And relieve sacral pain. Mild improvement in pressure distrobution.         Therapy Documentation Precautions:  Precautions Precautions: Fall Precaution Comments: left inattention and right gaze, increased extensor tone LLE and Flexion tone LUE at shoulder and elbow Restrictions Weight Bearing Restrictions: No Vital Signs: Therapy Vitals Pulse Rate: 68 BP: 122/66 Pain: Pain Assessment Pain Scale: 0-10 Pain Score: 6  Pain Type: Acute pain Pain Location: Back Pain Orientation: Upper Pain Descriptors /  Indicators: Aching Pain Frequency: Intermittent Pain Onset: Gradual Patients Stated Pain Goal: 4 Pain Intervention(s): Medication (See eMAR)    Therapy/Group: Individual Therapy  Golden Pop 04/12/2018, 10:31 AM

## 2018-04-12 NOTE — Progress Notes (Signed)
Occupational Therapy Session Note  Patient Details  Name: Jessica Meyer MRN: [id number] Date of Birth: [birth year]  Today's Date: 04/12/2018 OT Individual Time: 1345-1445 OT Individual Time Calculation (min): 60 min    Short Term Goals: Week 1:  OT Short Term Goal 1 (Week 1): Pt will maintain static sitting balance EOB with mod A for 1 minute during ADL task OT Short Term Goal 2 (Week 1): Pt will attend to L visual field for ~30 seconds with moderate verbal cueing  OT Short Term Goal 3 (Week 1): Pt will initiate giving caregiver instructions with L UE positioning  OT Short Term Goal 4 (Week 1): Pt will don shirt at bed level with mod A  Skilled Therapeutic Interventions/Progress Updates:    Treatment session with focus on sitting balance, midline orientation, Lt attention, and following one step commands.  Pt received upright in tilt in space w/c with legs off leg rests and reporting need to utilize bedpan.  Pt refused attempt to use BSC or toilet, therefore transferred back to bed via slide board with mod assist +2.  Therapist provided multimodal cues for positioning and weight shifting to decrease pushing tendencies during transfer.  Engaged in rolling at bed level to place on bedpan, pt with smear in hospital brief but with no results on bedpan.  Returned to sitting at EOB with max multimodal cues for positioning to obtain midline sitting posture and cues for increased upright position and pt with severe posterior lean.  Utilized [employer] for visual feedback during reaching activity to facilitate weight shift and reaching across midline with RUE to facilitate weight shift and increased attention to Lt environment.  Pt demonstrating improved ability to scan past midline with increased repetition.  +2 for safety with sitting balance and during mobility due to impulsivity and pushing tendencies.  Returned to supine in bed at end of session and left semi-reclined with all needs in reach.  Therapy  Documentation Precautions:  Precautions Precautions: Fall Precaution Comments: left inattention and right gaze, increased extensor tone LLE and Flexion tone LUE at shoulder and elbow Restrictions Weight Bearing Restrictions: No Pain: Pain Assessment Pain Scale: 0-10 Pain Score: Asleep Pain Type: Acute pain Pain Location: Back Pain Orientation: Upper Pain Descriptors / Indicators: Aching Pain Frequency: Intermittent Pain Onset: Gradual Patients Stated Pain Goal: 4 Pain Intervention(s): Medication (See eMAR)   Therapy/Group: Individual Therapy  Rosalio Loud 04/12/2018, 3:21 PM

## 2018-04-12 NOTE — Progress Notes (Signed)
Social Work Patient ID: Jessica Meyer, female   DOB: 07/16/1935, 82 y.o.   MRN: [id number]   CSW met with pt to update her on team conference discussion and then spoke with her family (Debbie -dtr; Steve -son; Colby and Aubrey -grandchildren) to update them via telephone.  They are very devoted to pt and asked very thoughtful questions about her care and things they can do remotely to support and help her when they talk to her.  They are planning to have pt go to Debbie's house and hire private duty caregivers during the day and then Debbie will be with pt at night.  CSW will continue to follow and assist as needed.  

## 2018-04-12 NOTE — Progress Notes (Signed)
[place] PHYSICAL MEDICINE & REHABILITATION PROGRESS NOTE  Subjective/Complaints:  Pt aware that she has had CVA, Just getting up with PT but requesting back in bed  ROS: Denies CP, shortness of breath, nausea, vomiting, diarrhea.  Objective: Vital Signs: Blood pressure 122/66, pulse 68, temperature 98 F (36.7 C), temperature source Oral, resp. rate 18, height 5\' 5"  (1.651 m), weight 66.2 kg, SpO2 100 %. No results found. No results for input(s): WBC, HGB, HCT, PLT in the last 72 hours. No results for input(s): NA, K, CL, CO2, GLUCOSE, BUN, CREATININE, CALCIUM in the last 72 hours.  Physical Exam: BP 122/66   Pulse 68   Temp 98 F (36.7 C) (Oral)   Resp 18   Ht 5\' 5"  (1.651 m)   Wt 66.2 kg   SpO2 100%   BMI 24.29 kg/m  Constitutional: Well-developed.  Well-nourished.  NAD.  Vital signs reviewed. HENT: Normocephalic.  Atraumatic. Eyes: EOMI.  No discharge. Cardiovascular: No JVD. Respiratory: Effort normal  GI: Nondistended Musculoskeletal: no pain with LLE ROM, no left knee or ankle swelling Neurological: She is somnolent this a.m.  Left-sided neglect   Follows commands.    Poor insight into deficits Dysarthria Motor: LLE: 0/5 proximal to distal 0/5 left upper extremity unchanged RLE: 5/5 proximal to distal Sensation absent to light touch LLE  Severe left neglect Poor attention and concentration Increased ankle plantar flexor tone  Skin: Skin is warm and dry.  Psychiatric: Her affect is blunt. Her speech is delayed.   Assessment/Plan: 1. Functional deficits secondary to right MCA infarct which require 3+ hours per day of interdisciplinary therapy in a comprehensive inpatient rehab setting.  Physiatrist is providing close team supervision and 24 hour management of active medical problems listed below.  Physiatrist and rehab team continue to assess barriers to discharge/monitor patient progress toward functional and medical goals  Care Tool:  Bathing   Bathing activity did not occur: Safety/medical concerns           Bathing assist       Upper Body Dressing/Undressing Upper body dressing   What is the patient wearing?: Pull over shirt    Upper body assist Assist Level: 2 Helpers    Lower Body Dressing/Undressing Lower body dressing    Lower body dressing activity did not occur: Safety/medical concerns What is the patient wearing?: Incontinence brief, Pants     Lower body assist Assist for lower body dressing: 2 Helpers     Toileting Toileting    Toileting assist Assist for toileting: Total Assistance - Patient < 25%     Transfers Chair/bed transfer  Transfers assist  Chair/bed transfer activity did not occur: Safety/medical concerns  Chair/bed transfer assist level: 2 Helpers     Locomotion Ambulation   Ambulation assist   Ambulation activity did not occur: Safety/medical concerns          Walk 10 feet activity   Assist  Walk 10 feet activity did not occur: Safety/medical concerns        Walk 50 feet activity   Assist Walk 50 feet with 2 turns activity did not occur: Safety/medical concerns         Walk 150 feet activity   Assist Walk 150 feet activity did not occur: Safety/medical concerns         Walk 10 feet on uneven surface  activity   Assist Walk 10 feet on uneven surfaces activity did not occur: Safety/medical concerns  Wheelchair     Assist Will patient use wheelchair at discharge?: Yes Type of Wheelchair: Manual    Wheelchair assist level: Dependent - Patient 0%(tilt in space w/c) Max wheelchair distance: 150 ft    Wheelchair 50 feet with 2 turns activity    Assist        Assist Level: Dependent - Patient 0%   Wheelchair 150 feet activity     Assist     Assist Level: Dependent - Patient 0%      Medical Problem List and Plan: 1.  Left side weakness with dysphagia secondary to right MCA infarction in the setting of right ICA  and right MCA occlusion status post unsuccessful attempts at thrombectomy. CIR PT, OT, SLP. Poor awareness and motivation for therapy, asking to go to bed whenever up in chair Add PRAFO at noc .2.  Antithrombotics: -DVT/anticoagulation:  SCDs             -antiplatelet therapy: aspirin 81 mg daily, Plavix 75 mg daily 3. Pain Management:  Tylenol 3 for headache as needed, flank pain has subsided, right-sided neck pain is the current complaint.  Will change Tylenol 3 to Tylenol for added Robaxin discontinue gabapentin.  Also add trolamine cream, patient tends to perseverate on one complaint which also makes pain assessment difficult 4. Mood: Wellbutrin 300 mg daily,Lexapro 5 mg daily at bedtime             -antipsychotic agents: N/A 5. Neuropsych: This patient is not fully capable of making decisions on her own behalf. 6. Skin/Wound Care:  Routine skin checks 7. Fluids/Electrolytes/Nutrition:  Routine in and out's with follow-up chemistries tomorrow. 8. Dysphagia. Dysphagia #1 nectar liquids. Follow-up speech therapy             Advance diet as tolerated 9. Hypertension. Norvasc 5 mg daily,lisinopril 40 mg daily, Coreg 12.5 mg twice a day.   Monitor with increased mobility 10. Diabetes mellitus. Hemoglobin A1c 8.0. Glucotrol XL 2.5 mg daily, Lantus insulin 12 units daily at bedtime. Check blood sugars before meals and at bedtime.      CBG (last 3)  Recent Labs    04/11/18 1720 04/11/18 2123 04/12/18 0623  GLUCAP 114* 223* 166*  still some lability , monitor  11. Hyperlipidemia. Lipitor 12. Hypothyroidism. Continue Synthroid 13.  Acute blood loss anemia:              Hemoglobin 9.3 on 4/4 follow-up tomorrow 14.  CKD stage III:             Creatinine 1.11 on 4/4. 1.22 on 4/6 15.  Leukocytosis-afeb Has urinary incont and Right flank pain, now resolved UA negative Recheck in am    LOS: 5 days A FACE TO FACE EVALUATION WAS PERFORMED  Erick Colacendrew E Kirsteins 04/12/2018, 9:18 AM

## 2018-04-13 ENCOUNTER — Inpatient Hospital Stay (HOSPITAL_COMMUNITY): Payer: Medicare Other | Admitting: Physical Therapy

## 2018-04-13 ENCOUNTER — Inpatient Hospital Stay (HOSPITAL_COMMUNITY): Payer: Medicare Other | Admitting: Occupational Therapy

## 2018-04-13 ENCOUNTER — Inpatient Hospital Stay (HOSPITAL_COMMUNITY): Payer: Medicare Other | Admitting: Speech Pathology

## 2018-04-13 LAB — GLUCOSE, CAPILLARY
Glucose-Capillary: 156 mg/dL — ABNORMAL HIGH (ref 70–99)
Glucose-Capillary: 241 mg/dL — ABNORMAL HIGH (ref 70–99)
Glucose-Capillary: 149 mg/dL — ABNORMAL HIGH (ref 70–99)
Glucose-Capillary: 183 mg/dL — ABNORMAL HIGH (ref 70–99)

## 2018-04-13 NOTE — Progress Notes (Signed)
Patient has been up since 0230, using the call bell repeatedly, screaming help into the hallway, frequent requests, pulling off covers & asking to be covered up repeatedly, asking for water, etc. No acute distress noted.

## 2018-04-13 NOTE — Progress Notes (Signed)
[place] PHYSICAL MEDICINE & REHABILITATION PROGRESS NOTE  Subjective/Complaints:  In bed intermittently somnolent and awake  ROS: Denies CP, shortness of breath, nausea, vomiting, diarrhea.  Objective: Vital Signs: Blood pressure 127/60, pulse 71, temperature 97.8 F (36.6 C), temperature source Oral, resp. rate 18, height 5\' 5"  (1.651 m), weight 66.2 kg, SpO2 94 %. No results found. No results for input(s): WBC, HGB, HCT, PLT in the last 72 hours. No results for input(s): NA, K, CL, CO2, GLUCOSE, BUN, CREATININE, CALCIUM in the last 72 hours.  Physical Exam: BP 127/60 (BP Location: Right Arm)   Pulse 71   Temp 97.8 F (36.6 C) (Oral)   Resp 18   Ht 5\' 5"  (1.651 m)   Wt 66.2 kg   SpO2 94%   BMI 24.29 kg/m  Constitutional: Well-developed.  Well-nourished.  NAD.  Vital signs reviewed. HENT: Normocephalic.  Atraumatic. Eyes: EOMI.  No discharge. Cardiovascular: No JVD. Respiratory: Effort normal  GI: Nondistended Musculoskeletal: pain with LUE ext due to increased tone Neurological: She is somnolent this a.m.  Left-sided neglect   Follows commands.    Poor insight into deficits Dysarthria Motor: LLE: 0/5 proximal to distal 0/5 left upper extremity unchanged RLE: 5/5 proximal to distal Sensation absent to light touch LLE  Severe left neglect Poor attention and concentration Increased ankle plantar flexor tone MAS 3 Left elbow flexors Skin: Skin is warm and dry.  Psychiatric: Her affect is blunt. Her speech is delayed.   Assessment/Plan: 1. Functional deficits secondary to right MCA infarct which require 3+ hours per day of interdisciplinary therapy in a comprehensive inpatient rehab setting.  Physiatrist is providing close team supervision and 24 hour management of active medical problems listed below.  Physiatrist and rehab team continue to assess barriers to discharge/monitor patient progress toward functional and medical goals  Care Tool:  Bathing   Bathing activity did not occur: Safety/medical concerns           Bathing assist       Upper Body Dressing/Undressing Upper body dressing   What is the patient wearing?: Pull over shirt    Upper body assist Assist Level: 2 Helpers    Lower Body Dressing/Undressing Lower body dressing    Lower body dressing activity did not occur: Safety/medical concerns What is the patient wearing?: Incontinence brief, Pants     Lower body assist Assist for lower body dressing: 2 Helpers     Toileting Toileting    Toileting assist Assist for toileting: Total Assistance - Patient < 25%     Transfers Chair/bed transfer  Transfers assist  Chair/bed transfer activity did not occur: Safety/medical concerns  Chair/bed transfer assist level: 2 Helpers     Locomotion Ambulation   Ambulation assist   Ambulation activity did not occur: Safety/medical concerns          Walk 10 feet activity   Assist  Walk 10 feet activity did not occur: Safety/medical concerns        Walk 50 feet activity   Assist Walk 50 feet with 2 turns activity did not occur: Safety/medical concerns         Walk 150 feet activity   Assist Walk 150 feet activity did not occur: Safety/medical concerns         Walk 10 feet on uneven surface  activity   Assist Walk 10 feet on uneven surfaces activity did not occur: Safety/medical concerns         Wheelchair     Assist  Will patient use wheelchair at discharge?: Yes Type of Wheelchair: Manual    Wheelchair assist level: Dependent - Patient 0% Max wheelchair distance: 150 ft    Wheelchair 50 feet with 2 turns activity    Assist        Assist Level: Dependent - Patient 0%   Wheelchair 150 feet activity     Assist     Assist Level: Dependent - Patient 0%      Medical Problem List and Plan: 1.  Left side weakness with dysphagia secondary to right MCA infarction in the setting of right ICA and right MCA  occlusion status post unsuccessful attempts at thrombectomy. CIR PT, OT, SLP. Poor awareness and motivation for therapy, asking to go to bed whenever up in chair Add PRAFO at noc .2.  Antithrombotics: -DVT/anticoagulation:  SCDs             -antiplatelet therapy: aspirin 81 mg daily, Plavix 75 mg daily 3. Pain Management:  Tylenol 3 for headache as needed, flank pain has subsided, right-sided neck pain is the current complaint.  Will change Tylenol 3 to Tylenol for added Robaxin discontinue gabapentin.  Also add trolamine cream, patient tends to perseverate on one complaint which also makes pain assessment difficult 4. Mood: Wellbutrin 300 mg daily,Lexapro 5 mg daily at bedtime             -antipsychotic agents: N/A 5. Neuropsych: This patient is not fully capable of making decisions on her own behalf. 6. Skin/Wound Care:  Routine skin checks 7. Fluids/Electrolytes/Nutrition:  Routine in and out's with follow-up chemistries tomorrow. 8. Dysphagia. Dysphagia #1 nectar liquids. Follow-up speech therapy             Advance diet as tolerated 9. Hypertension. Norvasc 5 mg daily,lisinopril 40 mg daily, Coreg 12.5 mg twice a day.   Monitor with increased mobility 10. Diabetes mellitus. Hemoglobin A1c 8.0. Glucotrol XL 2.5 mg daily, Lantus insulin 12 units daily at bedtime. Check blood sugars before meals and at bedtime.      CBG (last 3)  Recent Labs    04/12/18 1642 04/12/18 2221 04/13/18 0624  GLUCAP 177* 169* 156*  still some lability , monitor  11. Hyperlipidemia. Lipitor 12. Hypothyroidism. Continue Synthroid 13.  Acute blood loss anemia:              Hemoglobin 9.3 on 4/4 follow-up tomorrow 14.  CKD stage III:             Creatinine 1.11 on 4/4. 1.22 on 4/6 15.  Leukocytosis-afeb Has urinary incont and Right flank pain, now resolved UA negative Recheck in am    LOS: 6 days A FACE TO FACE EVALUATION WAS PERFORMED  Erick Colace 04/13/2018, 9:46 AM

## 2018-04-13 NOTE — Progress Notes (Signed)
Occupational Therapy Session Note  Patient Details  Name: Jessica Meyer MRN: [id number] Date of Birth: 1935-03-12  Today's Date: 04/13/2018 OT Individual Time: 7414-2395 OT Individual Time Calculation (min): 56 min   Short Term Goals: Week 1:  OT Short Term Goal 1 (Week 1): Pt will maintain static sitting balance EOB with mod A for 1 minute during ADL task OT Short Term Goal 2 (Week 1): Pt will attend to L visual field for ~30 seconds with moderate verbal cueing  OT Short Term Goal 3 (Week 1): Pt will initiate giving caregiver instructions with L UE positioning  OT Short Term Goal 4 (Week 1): Pt will don shirt at bed level with mod A  Skilled Therapeutic Interventions/Progress Updates:    Pt greeted in bed, reporting pain in buttocks. Per RN, pt premedicated for pain. Supine<sit completed with 2 helpers, with pt needing Max-Total A for static sitting balance due to Lt pushing. Max manual facilitation for midline orientation, anterior weight shift, and keeping both hands in lap. Without manual cuing, pt with exhibits extensor tone and would lose balance posterioraly. Used mirror to increase postural awareness. RN assisted pt with eating lunch. Pt had to lean towards Rt to retrieve food from spoon with max-total manual facilitation. Afterwards, she Zoom chatted with family while still sitting EOB to continue working on postural control. Similar cuing required to maintain sitting balance. After about 30 minutes of unsupported sitting, she was adamant about lying down due to back pain. 2 assist for safe transition to supine and boosting up in bed. With HOB elevated, she drank honey thickened water with supervision with RT managing cup. Step by step cuing and Max A for correcting Lt lean while sitting up in bed to speak with family via Zoom. OT also performed passive stretching to hypertonic L UE at this time for contracture prevention. At end of session pt requested to use bedpan. She was left with NT  for toileting.  Therapy Documentation Precautions:  Precautions Precautions: Fall Precaution Comments: left inattention and right gaze, increased extensor tone LLE and Flexion tone LUE at shoulder and elbow Restrictions Weight Bearing Restrictions: No Vital Signs: Therapy Vitals Temp: 98 F (36.7 C) Temp Source: Oral Pulse Rate: 67 Resp: 14 BP: (!) 112/59 Patient Position (if appropriate): Lying Oxygen Therapy SpO2: 100 % O2 Device: Room Air ADL: ADL Eating: Maximal assistance Where Assessed-Eating: Bed level Grooming: Moderate assistance Where Assessed-Grooming: Bed level Upper Body Bathing: Maximal assistance Where Assessed-Upper Body Bathing: Bed level Lower Body Bathing: Maximal assistance Where Assessed-Lower Body Bathing: Bed level Upper Body Dressing: Maximal assistance Where Assessed-Upper Body Dressing: Bed level Lower Body Dressing: Dependent Where Assessed-Lower Body Dressing: Bed level Toileting: Dependent Where Assessed-Toileting: Bed level Toilet Transfer: Unable to assess      Therapy/Group: Individual Therapy  Ronnie Mallette A Cuahutemoc Attar 04/13/2018, 4:41 PM

## 2018-04-13 NOTE — Progress Notes (Signed)
Occupational Therapy Session Note  Patient Details  Name: Jessica Meyer MRN: [id number] Date of Birth: [birth year]  Today's Date: 04/13/2018 OT Individual Time: 8891-6945 OT Individual Time Calculation (min): 45 min    Short Term Goals: Week 1:  OT Short Term Goal 1 (Week 1): Pt will maintain static sitting balance EOB with mod A for 1 minute during ADL task OT Short Term Goal 2 (Week 1): Pt will attend to L visual field for ~30 seconds with moderate verbal cueing  OT Short Term Goal 3 (Week 1): Pt will initiate giving caregiver instructions with L UE positioning  OT Short Term Goal 4 (Week 1): Pt will don shirt at bed level with mod A  Skilled Therapeutic Interventions/Progress Updates:    (unable to find pt's hearing aids in her room today)  Pt received in bed in a twisted position with her body on one side adjacent to rail and B legs dangling off.  Repositioned pt and pt agreeable to working on B/D from bed level. With a loud voice pt can hear.  Due to severe flexor tone in LUE and pain with any gentle stretching, she needs max A with UB bathing and total with dressing. Pt did initiate and did wash part of her L arm without cues and worked hard to get her R arm into shirt sleeve.  LB bathing and dressing from supine with pt rolling with mod A.  Pt able to tolerate flexion of LLE to allow for smoother rolling.  Needed to reposition pt several times as she moved frequently in the bed.    Toward end of session, went to retrieve TED hose for pt and hair tye.  Upon returning, pt sound asleep. Did not wake pt as she had a restless night last night.  Bed alarm on.    Therapy Documentation Precautions:  Precautions Precautions: Fall Precaution Comments: left inattention and right gaze, increased extensor tone LLE and Flexion tone LUE at shoulder and elbow Restrictions Weight Bearing Restrictions: No    Pain: Pt would grimace when her L arm was being moved. No pain at rest.      Therapy/Group: Individual Therapy  SAGUIER,JULIA 04/13/2018, 11:19 AM

## 2018-04-13 NOTE — Plan of Care (Signed)
  Problem: Consults Goal: Nutrition Consult-if indicated Outcome: Progressing   Problem: RH BLADDER ELIMINATION Goal: RH STG MANAGE BLADDER WITH ASSISTANCE Description STG Manage Bladder With Assistance Outcome: Progressing

## 2018-04-13 NOTE — Progress Notes (Signed)
Speech Language Pathology Daily Session Note  Patient Details  Name: Jessica Meyer MRN: [id number] Date of Birth: 10-Feb-1935  Today's Date: 04/13/2018 SLP Individual Time: 1430-1455 SLP Individual Time Calculation (min): 25 min  Short Term Goals: Week 1: SLP Short Term Goal 1 (Week 1): Pt will demonstrate sustained attention to task for ~ 15 minutes with Min A cues.  SLP Short Term Goal 2 (Week 1): Pt will scan to left of midline to locate items in 8 out of 10 opportunities with Max A cues.  SLP Short Term Goal 3 (Week 1): Pt will complete basic familiar problem solving tasks with Mod A cues.  SLP Short Term Goal 4 (Week 1): Pt will consume current diet with minimal s/s of aspiration and Mod A cues for use of compensatory swallow strategies.  SLP Short Term Goal 5 (Week 1): Pt will consume trials of nectar thick liquids with minimal overt s/s of aspiration with Max A cues for use of compensatory swallow strategies to indicate readiness for instrumental study.   Skilled Therapeutic Interventions: Skilled treatment session focused on cognitive goals. Upon arrival, patient was asleep while supine in bed. Patient was easily awakened and reported she needed the bedpan. Patient had been incontinent but requested to continue to try to use the bedpan. SLP provided constant cues due to patient falling asleep. Patient also required Mod A verbal and tactile cues to follow directions during bed mobility and attention to tasks. Patient left supine in bed with alarm on and all needs within reach. Continue with current plan of care.      Pain No/Denies Pain   Therapy/Group: Individual Therapy  Cully Luckow 04/13/2018, 2:59 PM

## 2018-04-13 NOTE — Progress Notes (Signed)
Physical Therapy Session Note  Patient Details  Name: Jessica Meyer MRN: [id number] Date of Birth: Dec 19, 1935  Today's Date: 04/13/2018 PT Individual Time: 9292-4462 PT Individual Time Calculation (min): 60 min   Short Term Goals: Week 1:  PT Short Term Goal 1 (Week 1): Pt will perform bed<>chair transfer with Max assist +1 PT Short Term Goal 2 (Week 1): Pt will initiate gait training PT Short Term Goal 3 (Week 1): Pt will maintain static/dynamic sitting balance with CGA-min assist during functional tasks  Skilled Therapeutic Interventions/Progress Updates:   Pt received supine in bed and agreeable to PT. Supine>sit transfer with total assist and max encouragement for participation.  Sitting balance EOB x 5 minutes with max assist from PT. Instruction to WB through R forearm to prevent pushing, but noted to still have pushing tendencies. Able to sustain WB through R elbow x 2 minutes with intermittent mod assist from PT and max cues for improved anterior weight shift. SB transfer to [facility] with total Assist +2. Pt noted to be able to pull with RUE to assist in transfer initially. Positioning in [facility] with total assist from PT, but pt able to initiate anterior weight shift.  Transported to rehab gym in [facility]. PT adjusted BLE ELR to improve DF in resting position to prevent increased contracture.  Sitting balance in WC with visual feedback from mirror to perform R lateral reach 2 x 10 with mod assist from PT to correct to midline. Pt scooted closer to edge of seat for second bout to decrease trunk support. Pt also able to place object in pile on L arm rest with max cues for increased visual scanning.  Patient returned to room, and PT placed L lap tray on WC, and left sitting in [facility] with call bell in reach and all needs met.         Therapy Documentation Precautions:  Precautions Precautions: Fall Precaution Comments: left inattention and right gaze, increased extensor tone LLE and Flexion tone LUE  at shoulder and elbow Restrictions Weight Bearing Restrictions: No    Pain: Pain Assessment Pain Scale: 0-10 Pain Score: 9   See MAR  Therapy/Group: Individual Therapy  Lorie Phenix 04/13/2018, 12:27 PM

## 2018-04-14 ENCOUNTER — Inpatient Hospital Stay (HOSPITAL_COMMUNITY): Payer: Medicare Other | Admitting: Occupational Therapy

## 2018-04-14 ENCOUNTER — Inpatient Hospital Stay (HOSPITAL_COMMUNITY): Payer: Medicare Other | Admitting: Physical Therapy

## 2018-04-14 ENCOUNTER — Inpatient Hospital Stay (HOSPITAL_COMMUNITY): Payer: Medicare Other | Admitting: Speech Pathology

## 2018-04-14 LAB — GLUCOSE, CAPILLARY
Glucose-Capillary: 177 mg/dL — ABNORMAL HIGH (ref 70–99)
Glucose-Capillary: 240 mg/dL — ABNORMAL HIGH (ref 70–99)
Glucose-Capillary: 106 mg/dL — ABNORMAL HIGH (ref 70–99)
Glucose-Capillary: 179 mg/dL — ABNORMAL HIGH (ref 70–99)

## 2018-04-14 MED ORDER — ASPIRIN 81 MG PO CHEW
81.0000 mg | CHEWABLE_TABLET | Freq: Every day | ORAL | Status: DC
  Administered 2018-04-15 – 2018-05-05 (×21): 81 mg via ORAL
  Filled 2018-04-14 (×21): qty 1

## 2018-04-14 NOTE — Progress Notes (Signed)
Jessica Meyer is a 83 y.o. female admitted for CIR with left-sided weakness and dysphasia secondary to right MCA infarction in the setting of prior history of right ICA and right MCA occlusion.  Status post unsuccessful thrombectomy  Past Medical History:  Diagnosis Date  . Arthritis   . Diabetes mellitus without complication (HCC)   . History of chicken pox   . Hyperlipidemia   . Hypertension   . Thyroid disease    hypothyroid     Subjective: No new complaints. No new problems.  States that she has had a sore throat for greater than 1 month.  Occasional cough  Objective: Vital signs in last 24 hours: Temp:  [97.4 F (36.3 C)-98.6 F (37 C)] 97.4 F (36.3 C) (04/11 0417) Pulse Rate:  [59-67] 59 (04/11 0417) Resp:  [14-18] 18 (04/11 0417) BP: (112-133)/(54-59) 133/57 (04/11 0417) SpO2:  [95 %-100 %] 97 % (04/11 0417) Weight change:  Last BM Date: 04/13/18  Intake/Output from previous day: 04/10 0701 - 04/11 0700 In: 600 [P.O.:600] Out: -  Last cbgs: CBG (last 3)  Recent Labs    04/13/18 1653 04/13/18 2123 04/14/18 0619  GLUCAP 149* 183* 177*   Patient Vitals for the past 24 hrs:  BP Temp Temp src Pulse Resp SpO2  04/14/18 0417 (!) 133/57 (!) 97.4 F (36.3 C) Oral (!) 59 18 97 %  04/13/18 1915 (!) 122/54 98.6 F (37 C) Oral 62 18 95 %  04/13/18 1510 (!) 112/59 98 F (36.7 C) Oral 67 14 100 %     Physical Exam General: No apparent distress occasional paroxysms of coughing HEENT: not dry Lungs: Normal effort. Lungs clear to auscultation, no crackles or wheezes. Cardiovascular: Regular rate and rhythm, no edema Abdomen: S/NT/ND; BS(+) Musculoskeletal:  unchanged Neurological: No new neurological deficits with left-sided hemiparesis Wounds: N/A    Skin: clear   Mental state: Alert, oriented, cooperative    Lab Results: BMET    Component Value Date/Time   NA 137 04/09/2018 0325   K 3.9 04/09/2018 0325   CL 106 04/09/2018 0325   CO2 22 04/09/2018  0325   GLUCOSE 144 (H) 04/09/2018 0325   BUN 15 04/09/2018 0325   CREATININE 1.22 (H) 04/09/2018 0325   CALCIUM 9.5 04/09/2018 0325   GFRNONAA 42 (L) 04/09/2018 0325   GFRAA 48 (L) 04/09/2018 0325   CBC    Component Value Date/Time   WBC 13.1 (H) 04/09/2018 0325   RBC 3.30 (L) 04/09/2018 0325   HGB 10.0 (L) 04/09/2018 0325   HCT 31.0 (L) 04/09/2018 0325   PLT 208 04/09/2018 0325   MCV 93.9 04/09/2018 0325   MCH 30.3 04/09/2018 0325   MCHC 32.3 04/09/2018 0325   RDW 13.1 04/09/2018 0325   LYMPHSABS 1.6 04/09/2018 0325   MONOABS 1.2 (H) 04/09/2018 0325   EOSABS 0.3 04/09/2018 0325   BASOSABS 0.0 04/09/2018 0325    Medications: I have reviewed the patient's current medications.  Assessment/Plan:  Functional deficits following a right MCA infarction.  Continue CIR DVT prophylaxis.  Continue SCDs and DAPT Hypertension blood pressure stable.  Continue amlodipine and lisinopril and Coreg.  Continue to monitor Diabetes mellitus.  Reasonable glycemic control.  Continue basal insulin and Glucotrol XL  Length of stay, days: 7  Gordy Savers , MD 04/14/2018, 11:00 AM

## 2018-04-14 NOTE — Progress Notes (Signed)
Occupational Therapy Session Note  Patient Details  Name: Jessica Meyer MRN: [id number] Date of Birth: [birth year]  Today's Date: 04/14/2018 OT Individual Time: 1045-1130 OT Individual Time Calculation (min): 45 min    Short Term Goals: Week 1:  OT Short Term Goal 1 (Week 1): Pt will maintain static sitting balance EOB with mod A for 1 minute during ADL task OT Short Term Goal 2 (Week 1): Pt will attend to L visual field for ~30 seconds with moderate verbal cueing  OT Short Term Goal 3 (Week 1): Pt will initiate giving caregiver instructions with L UE positioning  OT Short Term Goal 4 (Week 1): Pt will don shirt at bed level with mod A  Skilled Therapeutic Interventions/Progress Updates:    Upon entering the room, pt supine in bed and sleeping soundly. Pt having R hearing aide in and unable to locate L hearing aide this session. RN notified. Pt rolling L <> R with max A and use of bed rails. Pt initiating movement with mod cuing and increased time. LB clothing management with total A from bed level. Supine >sit with total A to EOB. Pt very internally and externally distracted and needing max cuing to attend to task with pt following one step commands 50% of the time. Slide board transfer from bed >wheelchair towards the R with bobath technique and pt's UEs in lap to decrease pushing +2 assistance for safety. Once in chair, pt requesting something to drink with OT providing liquids with spoon for safety and mod cuing for swallowing strategies. No coughs this session. Pt repositioned for comfort and chair alarm donned. Call bell within reach.   Therapy Documentation Precautions:  Precautions Precautions: Fall Precaution Comments: left inattention and right gaze, increased extensor tone LLE and Flexion tone LUE at shoulder and elbow Restrictions Weight Bearing Restrictions: No   Pain: Pain Assessment Pain Scale: 0-10 Pain Score: (Would yell out in pain intermittently with movement or to  touch but no specific areas of pain identified) Pain Type: Acute pain Pain Location: Neck Pain Orientation: Posterior;Right;Left Pain Radiating Towards: left shoulder Pain Descriptors / Indicators: Aching;Discomfort Pain Frequency: Constant Pain Onset: On-going Patients Stated Pain Goal: 4 Pain Intervention(s): Repositioned ADL: ADL Eating: Maximal assistance Where Assessed-Eating: Bed level Grooming: Moderate assistance Where Assessed-Grooming: Bed level Upper Body Bathing: Maximal assistance Where Assessed-Upper Body Bathing: Bed level Lower Body Bathing: Maximal assistance Where Assessed-Lower Body Bathing: Bed level Upper Body Dressing: Maximal assistance Where Assessed-Upper Body Dressing: Bed level Lower Body Dressing: Dependent Where Assessed-Lower Body Dressing: Bed level Toileting: Dependent Where Assessed-Toileting: Bed level Toilet Transfer: Unable to assess   Therapy/Group: Individual Therapy  Alen Bleacher 04/14/2018, 12:25 PM

## 2018-04-14 NOTE — Progress Notes (Signed)
Speech Language Pathology Daily Session Note  Patient Details  Name: Jessica Meyer MRN: [id number] Date of Birth: 24-Sep-1935  Today's Date: 04/14/2018 SLP Individual Time: 0909-1005 SLP Individual Time Calculation (min): 56 min  Short Term Goals: Week 1: SLP Short Term Goal 1 (Week 1): Pt will demonstrate sustained attention to task for ~ 15 minutes with Min A cues.  SLP Short Term Goal 2 (Week 1): Pt will scan to left of midline to locate items in 8 out of 10 opportunities with Max A cues.  SLP Short Term Goal 3 (Week 1): Pt will complete basic familiar problem solving tasks with Mod A cues.  SLP Short Term Goal 4 (Week 1): Pt will consume current diet with minimal s/s of aspiration and Mod A cues for use of compensatory swallow strategies.  SLP Short Term Goal 5 (Week 1): Pt will consume trials of nectar thick liquids with minimal overt s/s of aspiration with Max A cues for use of compensatory swallow strategies to indicate readiness for instrumental study.   Skilled Therapeutic Interventions:  Pt was seen for skilled ST targeting dysphagia and cognition goals.  Pt was sitting up in bed with nurse tech present providing full supervision during breakfast meal.  Nurse tech reports that pt had several instances of coughing on liquids this morning which appeared to be related to portion control.  After therapist resumed full supervision of meal, pt needed mod-max cues for rate and portion control as well as to monitor and correct left sided labial spillage of both pureed and thickened liquid boluses.  Pt had ~2 instances of coughing in addition to increased labial loss of liquids as she was finishing her thickened milk which SLP suspects to be related to change in positioning (head tilted back) in order to get liquids at the bottom of the cup resulting in decreased control of bolus.  Recommend that pt remain on her currently prescribed diet with full supervision for use of swallowing precautions.  After meal, therapist facilitated the session with a basic card sorting task to address visual scanning to the left of midline and sustained attention to task.  Pt initially needed max assist to locate cards to the left of midline.  However, as task progressed therapist was able to fade cues to min assist and increase task challenge by moving cards in various locations in both right and left upper and lower visual fields.   Pt was able to sustain her attention to task for ~2-3 minute intervals before requiring min-mod assist verbal cues for redirection to task.  Pt was left in bed with bed alarm set and call bell within reach.  Continue per current plan of care.    Pain Pain Assessment Pain Scale: 0-10 Pain Score: (Would yell out in pain intermittently with movement or to touch but no specific areas of pain identified) Pain Intervention(s): Repositioned  Therapy/Group: Individual Therapy  Zaccai Chavarin, Melanee Spry 04/14/2018, 12:11 PM

## 2018-04-15 ENCOUNTER — Inpatient Hospital Stay (HOSPITAL_COMMUNITY): Payer: Medicare Other | Admitting: Occupational Therapy

## 2018-04-15 LAB — GLUCOSE, CAPILLARY
Glucose-Capillary: 146 mg/dL — ABNORMAL HIGH (ref 70–99)
Glucose-Capillary: 208 mg/dL — ABNORMAL HIGH (ref 70–99)
Glucose-Capillary: 176 mg/dL — ABNORMAL HIGH (ref 70–99)
Glucose-Capillary: 111 mg/dL — ABNORMAL HIGH (ref 70–99)

## 2018-04-15 MED ORDER — GUAIFENESIN 100 MG/5ML PO SOLN: 5.0000 mL | ORAL | Status: DC | PRN

## 2018-04-15 NOTE — Progress Notes (Signed)
Jessica Meyer is a 83 y.o. female admitted for CIR with functional deficits secondary to right MCA infarction with left-sided weakness and dysphasia.  Patient has prior history of right ICA and right MCA occlusion and failed thrombectomy in the past  Past Medical History:  Diagnosis Date  . Arthritis   . Diabetes mellitus without complication (HCC)   . History of chicken pox   . Hyperlipidemia   . Hypertension   . Thyroid disease    hypothyroid     Subjective: No further complaints of sore throat but continues to have cough and is requesting medication  Objective: Vital signs in last 24 hours: Temp:  [97.6 F (36.4 C)-98.3 F (36.8 C)] 98.2 F (36.8 C) (04/12 0422) Pulse Rate:  [56-63] 62 (04/12 0422) Resp:  [16-18] 18 (04/12 0422) BP: (115-136)/(39-99) 115/99 (04/12 0422) SpO2:  [93 %-98 %] 98 % (04/12 0422) Weight change:  Last BM Date: 04/15/18  Intake/Output from previous day: 04/11 0701 - 04/12 0700 In: 720 [P.O.:720] Out: -  Last cbgs: CBG (last 3)  Recent Labs    04/14/18 1651 04/14/18 2100 04/15/18 0619  GLUCAP 106* 179* 146*   Patient Vitals for the past 24 hrs:  BP Temp Temp src Pulse Resp SpO2  04/15/18 0422 (!) 115/99 98.2 F (36.8 C) Oral 62 18 98 %  04/14/18 1927 (!) 121/39 98.3 F (36.8 C) Oral 63 18 93 %  04/14/18 1540 (!) 136/59 97.6 F (36.4 C) Oral (!) 56 16 98 %     Physical Exam General: No apparent distress   HEENT: not dry Lungs: Normal effort. Lungs clear to auscultation, no crackles or wheezes. Cardiovascular: Regular rate and rhythm, no edema Abdomen: S/NT/ND; BS(+) Musculoskeletal:  unchanged Neurological: No new neurological deficits with left-sided weakness Wounds: N/A    Skin: clear   Mental state: Alert, oriented, cooperative    Lab Results: BMET    Component Value Date/Time   NA 137 04/09/2018 0325   K 3.9 04/09/2018 0325   CL 106 04/09/2018 0325   CO2 22 04/09/2018 0325   GLUCOSE 144 (H) 04/09/2018 0325   BUN 15 04/09/2018 0325   CREATININE 1.22 (H) 04/09/2018 0325   CALCIUM 9.5 04/09/2018 0325   GFRNONAA 42 (L) 04/09/2018 0325   GFRAA 48 (L) 04/09/2018 0325   CBC    Component Value Date/Time   WBC 13.1 (H) 04/09/2018 0325   RBC 3.30 (L) 04/09/2018 0325   HGB 10.0 (L) 04/09/2018 0325   HCT 31.0 (L) 04/09/2018 0325   PLT 208 04/09/2018 0325   MCV 93.9 04/09/2018 0325   MCH 30.3 04/09/2018 0325   MCHC 32.3 04/09/2018 0325   RDW 13.1 04/09/2018 0325   LYMPHSABS 1.6 04/09/2018 0325   MONOABS 1.2 (H) 04/09/2018 0325   EOSABS 0.3 04/09/2018 0325   BASOSABS 0.0 04/09/2018 0325     Medications: I have reviewed the patient's current medications.  Assessment/Plan:  Functional deficits following right MCA infarction with left hemiparesis.  Continue CIR Hypertension.  Blood pressure remains a bit labile.  No no change in triple therapy.  We will continue to monitor DVT prophylaxis.  Continue SCDs and DAPT Diabetes mellitus.  Reasonable glycemic control.  Continue Glucotrol XL as well as basal insulin Cough.  Will treat with as needed guaifenesin    Length of stay, days: 8  Gordy Savers , MD 04/15/2018, 10:04 AM  Thrombectomy in the past

## 2018-04-15 NOTE — Progress Notes (Signed)
Occupational Therapy Session Note  Patient Details  Name: Jessica Meyer MRN: [id number] Date of Birth: [birth year]  Today's Date: 04/15/2018 OT Individual Time: 4975-3005 OT Individual Time Calculation (min): 54 min   Short Term Goals: Week 1:  OT Short Term Goal 1 (Week 1): Pt will maintain static sitting balance EOB with mod A for 1 minute during ADL task OT Short Term Goal 2 (Week 1): Pt will attend to L visual field for ~30 seconds with moderate verbal cueing  OT Short Term Goal 3 (Week 1): Pt will initiate giving caregiver instructions with L UE positioning  OT Short Term Goal 4 (Week 1): Pt will don shirt at bed level with mod A  Skilled Therapeutic Interventions/Progress Updates:    Pt greeted in bed with no s/s pain. She reported need to use bedpan, and while it was being retrieved, pt reported having already voided in brief. 2 assist rolling and hygiene completion bedlevel with HOH for locating bedrails. Pt able to assist with frontal perihygiene given instruction. Pt able to elevate L LE a little against gravity given tactile cuing when donning pants bedlevel, also able to assist with elevating pants up thighs. 2 assist for safe supine<sit due to pt impulsivity, Lt pushing, and extensor tone. 2 assist for slideboard transfer to w/c with pts hands in lap to reduce pushing, and max manual facilitation for anterior weight shift. When in TIS, she followed command for doffing shirt with vcs for attention. Pt able to lift her Lt arm to assist with threading it into new shirt. She assisted with other components of donning shirt with instruction as well. When reclined in TIS, pt required max vcs to maintain forward gaze while OT and RT combed and styled hair. At end of session pt was reclined in TIS and left with all needs, half lap tray, Lt pillow prop, and safety belt fastened. Tx focus placed on following 1 step commands, functional transfers, safety during mobility, Lt attention, and ADL  retraining.    She drank honey thickened water during water, given via teaspoon with vcs for swallowing strategies. No coughs.     Therapy Documentation Precautions:  Precautions Precautions: Fall Precaution Comments: left inattention and right gaze, increased extensor tone LLE and Flexion tone LUE at shoulder and elbow Restrictions Weight Bearing Restrictions: No Vital Signs: Therapy Vitals Temp: 97.7 F (36.5 C) Temp Source: Oral Pulse Rate: 69 Resp: 16 BP: 135/60 Patient Position (if appropriate): Lying Oxygen Therapy SpO2: 97 % O2 Device: Room Air ADL: ADL Eating: Maximal assistance Where Assessed-Eating: Bed level Grooming: Moderate assistance Where Assessed-Grooming: Bed level Upper Body Bathing: Maximal assistance Where Assessed-Upper Body Bathing: Bed level Lower Body Bathing: Maximal assistance Where Assessed-Lower Body Bathing: Bed level Upper Body Dressing: Maximal assistance Where Assessed-Upper Body Dressing: Bed level Lower Body Dressing: Dependent Where Assessed-Lower Body Dressing: Bed level Toileting: Dependent Where Assessed-Toileting: Bed level Toilet Transfer: Unable to assess      Therapy/Group: Individual Therapy  Rayner Erman A Ashonti Leandro 04/15/2018, 4:42 PM

## 2018-04-16 ENCOUNTER — Inpatient Hospital Stay (HOSPITAL_COMMUNITY): Payer: Medicare Other

## 2018-04-16 ENCOUNTER — Inpatient Hospital Stay (HOSPITAL_COMMUNITY): Payer: Medicare Other | Admitting: Physical Therapy

## 2018-04-16 ENCOUNTER — Inpatient Hospital Stay (HOSPITAL_COMMUNITY): Payer: Medicare Other | Admitting: Occupational Therapy

## 2018-04-16 LAB — GLUCOSE, CAPILLARY
Glucose-Capillary: 150 mg/dL — ABNORMAL HIGH (ref 70–99)
Glucose-Capillary: 192 mg/dL — ABNORMAL HIGH (ref 70–99)
Glucose-Capillary: 136 mg/dL — ABNORMAL HIGH (ref 70–99)
Glucose-Capillary: 219 mg/dL — ABNORMAL HIGH (ref 70–99)

## 2018-04-16 LAB — CBC WITH DIFFERENTIAL/PLATELET
Abs Immature Granulocytes: 0.09 10*3/uL — ABNORMAL HIGH (ref 0.00–0.07)
Basophils Absolute: 0 10*3/uL (ref 0.0–0.1)
Basophils Relative: 0 %
Eosinophils Absolute: 0.3 10*3/uL (ref 0.0–0.5)
Eosinophils Relative: 3 %
HCT: 33.2 % — ABNORMAL LOW (ref 36.0–46.0)
Hemoglobin: 10.8 g/dL — ABNORMAL LOW (ref 12.0–15.0)
Immature Granulocytes: 1 %
Lymphocytes Relative: 14 %
Lymphs Abs: 1.4 10*3/uL (ref 0.7–4.0)
MCH: 31.1 pg (ref 26.0–34.0)
MCHC: 32.5 g/dL (ref 30.0–36.0)
MCV: 95.7 fL (ref 80.0–100.0)
Monocytes Absolute: 0.7 10*3/uL (ref 0.1–1.0)
Monocytes Relative: 7 %
Neutro Abs: 7.3 10*3/uL (ref 1.7–7.7)
Neutrophils Relative %: 75 %
Platelets: 366 10*3/uL (ref 150–400)
RBC: 3.47 MIL/uL — ABNORMAL LOW (ref 3.87–5.11)
RDW: 12.7 % (ref 11.5–15.5)
WBC: 9.8 10*3/uL (ref 4.0–10.5)
nRBC: 0 % (ref 0.0–0.2)

## 2018-04-16 NOTE — Progress Notes (Signed)
Occupational Therapy Weekly Progress Note  Patient Details  Name: Jessica Meyer MRN: [id number] Date of Birth: 05/29/1935  Beginning of progress report period: April 08, 2018 End of progress report period: April 16, 2018  Today's Date: 04/16/2018 OT Individual Time: 4098-1191 OT Individual Time Calculation (min): 44 min    Patient has met 0 of 4 short term goals. Pt making limited progress towards occupational therapy goals this week. Pt currently requires +2 for slide board transfers secondary to safety with increased pushing and tone once seated on EOB. Pt requiring total A to don LB clothing items from bed level and max - total A for UB dressing from seated position in wheelchair. Pt needing max cuing or hand over hand for self care tasks. Pt unable to attend to tasks during session and needing constant redirection. Pt also HOH.  Patient continues to demonstrate the following deficits: muscle weakness, decreased cardiorespiratoy endurance, decreased coordination and decreased motor planning, field cut, decreased attention to left, decreased initiation, decreased attention, decreased awareness, decreased problem solving, decreased safety awareness, decreased memory and delayed processing and decreased sitting balance, decreased standing balance, decreased postural control, hemiplegia and decreased balance strategies and therefore will continue to benefit from skilled OT intervention to enhance overall performance with BADL and Reduce care partner burden.  Patient progressing toward long term goals..  Continue plan of care.  OT Short Term Goals Week 1:  OT Short Term Goal 1 (Week 1): Pt will maintain static sitting balance EOB with mod A for 1 minute during ADL task OT Short Term Goal 1 - Progress (Week 1): Not met OT Short Term Goal 2 (Week 1): Pt will attend to L visual field for ~30 seconds with moderate verbal cueing  OT Short Term Goal 2 - Progress (Week 1): Not met OT Short Term Goal 3  (Week 1): Pt will initiate giving caregiver instructions with L UE positioning  OT Short Term Goal 3 - Progress (Week 1): Not met OT Short Term Goal 4 (Week 1): Pt will don shirt at bed level with mod A OT Short Term Goal 4 - Progress (Week 1): Not met Week 2:  OT Short Term Goal 1 (Week 2): Pt will maintain static sitting balance on EOB with max A for 1 minute OT Short Term Goal 2 (Week 2): Pt will attend to L visual field to locate self care items with max cuing.   Skilled Therapeutic Interventions/Progress Updates:    Upon entering the room, NT sitting pt up for breakfast tray and OT taking over supervision/assist. Pt naming items on food tray with max cuing and manual facilitation of head turn to turn and locate all items. Pt needing mod A to feed self with occasional help to scoop food and OT pushing food off of utensil if bite is too large. Pt needing mod cuing for swallowing strategies for safety as well. OT holding pictures of family members for pt to visually scan to L and tell therapist about the photograph with max cuing. Pt remained in bed and repositioned for comfort. Bed alarm activated and call bell within reach upon exiting the room.   Therapy Documentation Precautions:  Precautions Precautions: Fall Precaution Comments: left inattention and right gaze, increased extensor tone LLE and Flexion tone LUE at shoulder and elbow Restrictions Weight Bearing Restrictions: No General:   Vital Signs: Therapy Vitals Temp: 98.2 F (36.8 C) Pulse Rate: 64 Resp: 20 BP: 129/66 Patient Position (if appropriate): Lying Oxygen Therapy SpO2: 97 %  O2 Device: Room Air Pain: Pain Assessment Pain Scale: Faces Faces Pain Scale: Hurts a little bit Pain Type: Acute pain Pain Location: Neck Pain Intervention(s): Medication (See eMAR) ADL: ADL Eating: Maximal assistance Where Assessed-Eating: Bed level Grooming: Moderate assistance Where Assessed-Grooming: Bed level Upper Body Bathing:  Maximal assistance Where Assessed-Upper Body Bathing: Bed level Lower Body Bathing: Maximal assistance Where Assessed-Lower Body Bathing: Bed level Upper Body Dressing: Maximal assistance Where Assessed-Upper Body Dressing: Bed level Lower Body Dressing: Dependent Where Assessed-Lower Body Dressing: Bed level Toileting: Dependent Where Assessed-Toileting: Bed level Toilet Transfer: Unable to assess   Therapy/Group: Individual Therapy  Gypsy Decant 04/16/2018, 9:50 AM

## 2018-04-16 NOTE — Progress Notes (Signed)
Speech Language Pathology Weekly Progress and Session Note  Patient Details  Name: Jessica Meyer MRN: [id number] Date of Birth: [birth year]  Beginning of progress report period: April 09, 2018 End of progress report period: April 16, 2018  Today's Date: 04/16/2018 SLP Individual Time: 9449-6759 SLP Individual Time Calculation (min): 57 min  Short Term Goals: Week 1: SLP Short Term Goal 1 (Week 1): Pt will demonstrate sustained attention to task for ~ 15 minutes with Min A cues.  SLP Short Term Goal 1 - Progress (Week 1): Not met SLP Short Term Goal 2 (Week 1): Pt will scan to left of midline to locate items in 8 out of 10 opportunities with Max A cues.  SLP Short Term Goal 2 - Progress (Week 1): Met SLP Short Term Goal 3 (Week 1): Pt will complete basic familiar problem solving tasks with Mod A cues.  SLP Short Term Goal 3 - Progress (Week 1): Met SLP Short Term Goal 4 (Week 1): Pt will consume current diet with minimal s/s of aspiration and Mod A cues for use of compensatory swallow strategies.  SLP Short Term Goal 4 - Progress (Week 1): Not met SLP Short Term Goal 5 (Week 1): Pt will consume trials of nectar thick liquids with minimal overt s/s of aspiration with Max A cues for use of compensatory swallow strategies to indicate readiness for instrumental study.  SLP Short Term Goal 5 - Progress (Week 1): Revised due to lack of progress    New Short Term Goals: Week 2: SLP Short Term Goal 1 (Week 2): Pt will demonstrate sustained attention to task for ~ 4 minutes with Min A cues.  SLP Short Term Goal 2 (Week 2): Pt will scan to left of midline to locate items in 8 out of 10 opportunities with Mod A cues.  SLP Short Term Goal 3 (Week 2): Pt will complete basic familiar problem solving tasks with Min A cues.  SLP Short Term Goal 4 (Week 2): Pt will consume current diet with minimal s/s of aspiration and Mod A cues for use of compensatory swallow strategies.   Weekly Progress Updates: Pt  made limited progress meeting 2 out 5 goals this reporting period. Pt demonstrated increase left scanning ability and basic problem solving, however sustained attention continues to impact pt's carryover of cognitive and swallow skills. Pt's hearing deficit and inability to locate right hearing aid (report has been filed) further interferes with cognitive intervention. Pt participated in [facility] on 04/09/2018 demonstrating incomplete glottal closure during swallowing of NTL/thin liquids, with compensatory strategies being ineffective at this time due to cognitive deficits. SLP continues to recommend HTL and dys 1, while focusing on carry over of swallow strategies on current diet piror to trial of liquid upgrade. SLP will defer to primary SLP to downgrade LTG as needed due to slow progress.  Pt would continue to benefit from skilled ST services in order to maximuize functional independnece and reduce burden of care, requring 24 hor supervision and continue ST services.      Intensity: Minumum of 1-2 x/day, 30 to 90 minutes Frequency: 3 to 5 out of 7 days Duration/Length of Stay: 3 1/2 -4 weeks Treatment/Interventions: Cognitive remediation/compensation;Cueing hierarchy;Internal/external aids;Therapeutic Activities;Patient/family education;Functional tasks   Daily Session  Skilled Therapeutic Interventions:  Skilled ST services focused on swallow and cognitive skills. SLP facilitated repositioning in bed for PO consumption of HTL via cup, pt required max A verbal cues to consume small sips with moderate left anterior spillage, with  no awareness and no overt s/s aspiration. Pt requested to use use bathroom for BM, SLP called for assistance, NT, Jessica Meyer assisted with use of bedpan. Pt required mod A verbal cues for basic problem solving and max A verbal cues for sustained attention in 2 minute intervals complaining of pain. SLP facilitated basic problem solving, left scanning and sustained attention in color card  sorting task, pt required max A verbal fading to mod A verbal cues (1/2 way through task) for problem solving, max A verbal/ visual cues to scan left and min A verbal cues for sustained attention in 2-3 minute intervals during structured task.  Pt was left in room with call bell within reach and bed/ alarm set. ST recommends to continue skilled ST services.     General    Pain Pain Assessment Faces Pain Scale: Hurts even more Pain Type: Acute pain Pain Location: Buttocks Pain Orientation: Posterior Pain Descriptors / Indicators: Aching Pain Onset: Sudden Pain Intervention(s): Repositioned  Therapy/Group: Individual Therapy  Jessica Meyer  [facility] 04/16/2018, 2:36 PM

## 2018-04-16 NOTE — Progress Notes (Signed)
To rehab unit alert patient per bed accompanied by RN; Patient HOH bilateral hearing aid noted. Oriented to unit set up. Patient needs assistance but able to understand.

## 2018-04-16 NOTE — Progress Notes (Signed)
Physical Therapy Weekly Progress Note  Patient Details  Name: Jessica Meyer MRN: [id number] Date of Birth: 27-Jun-1935  Beginning of progress report period: April 08, 2018 End of progress report period: April 16, 2018  Today's Date: 04/16/2018   Patient has met 0 of 3 short term goals.  Pt has made limited progress this week as pt continues to demonstrate pushing tendencies and extensor tone with all movements. Pt has demonstrated limited carryover currently when performing sitting balance activities which continues to require maxA for support. SB transfers have been initiated however require +2 assist due to extensor tone which causes increased posterior lean. Will continue to focus on sitting balance and SB transfers.   Patient continues to demonstrate the following deficits muscle weakness and muscle paralysis, impaired timing and sequencing, abnormal tone, unbalanced muscle activation, decreased coordination and decreased motor planning and decreased initiation, decreased attention, decreased awareness, decreased problem solving, decreased safety awareness and decreased memory and therefore will continue to benefit from skilled PT intervention to increase functional independence with mobility.  Patient progressing toward long term goals.  Continue plan of care.  PT Short Term Goals Week 1:  PT Short Term Goal 1 (Week 1): Pt will perform bed<>chair transfer with Max assist +1 PT Short Term Goal 1 - Progress (Week 1): Not met PT Short Term Goal 2 (Week 1): Pt will initiate gait training PT Short Term Goal 2 - Progress (Week 1): Not met PT Short Term Goal 3 (Week 1): Pt will maintain static/dynamic sitting balance with CGA-min assist during functional tasks PT Short Term Goal 3 - Progress (Week 1): Not met Week 2:  PT Short Term Goal 1 (Week 2): Pt will perform sit<>stand using LRAD with mod A of +2 PT Short Term Goal 2 (Week 2): Pt will perform bed<>chair transfers with Max assist of +1 PT  Short Term Goal 3 (Week 2): Pt will maintain static/dynamic sitting balance with min A during functional tasks.   Therapy Documentation Precautions:  Precautions Precautions: Fall Precaution Comments: left inattention and right gaze, increased extensor tone LLE and Flexion tone LUE at shoulder and elbow Restrictions Weight Bearing Restrictions: No   Therapy/Group: Individual Therapy   Doreene Burke, PT, DPT  04/16/2018, 4:51 PM

## 2018-04-16 NOTE — Progress Notes (Signed)
Physical Therapy Session Note  Patient Details  Name: Chassity Ludke MRN: [id number] Date of Birth: Dec 23, 1935  Today's Date: 04/16/2018 PT Individual Time: 1445-1530 PT Individual Time Calculation (min): 45 min   Short Term Goals: Week 1:  PT Short Term Goal 1 (Week 1): Pt will perform bed<>chair transfer with Max assist +1 PT Short Term Goal 2 (Week 1): Pt will initiate gait training PT Short Term Goal 3 (Week 1): Pt will maintain static/dynamic sitting balance with CGA-min assist during functional tasks  Skilled Therapeutic Interventions/Progress Updates: Pt presented in bed requesting to use bedpan. Pt performed rolling L/R modA to  L, maxA to R for doffing brief and placing bed pan (-void). Once PTA removed bed pain pt then indicating urgency for urinary void followed by immediate episode of incontinence. Pt then performed additional rolling L/R in same manner for peri-care and brief management. Pt required frequent re-direction and cues for sequencing for task. Pt becoming more impulsive attempting to perform activity prior to PTA instruction (for set up) and then stating "what do you want me to do". Once brief on pt PTA threaded pants minA RLE, total A LLE. Pt attempted to perform bridge to pull pants over hips however pt unable to clear performed rolling L/R to complete task. Pt was able to pull self up to [facility] with max tactile cues for RUE placement on bed rail and PTA blocking B feet. Pt repositioned to comfort and pt indicating pain in LLE and requesting KPack placed. PTA placed heating pad and left with bed alarm on, call bell within reach and needs met.      Therapy Documentation Precautions:  Precautions Precautions: Fall Precaution Comments: left inattention and right gaze, increased extensor tone LLE and Flexion tone LUE at shoulder and elbow Restrictions Weight Bearing Restrictions: No General:   Vital Signs:   Pain: Pain Assessment Faces Pain Scale: Hurts even  more Pain Type: Acute pain Pain Location: Buttocks Pain Orientation: Posterior Pain Descriptors / Indicators: Aching Pain Onset: Sudden Pain Intervention(s): Repositioned    Therapy/Group: Individual Therapy  Videl Nobrega  Chauncy Mangiaracina, PTA  04/16/2018, 3:36 PM

## 2018-04-16 NOTE — Progress Notes (Signed)
Occupational Therapy Session Note  Patient Details  Name: Jessica Meyer MRN: [id number] Date of Birth: July 01, 1935  Today's Date: 04/16/2018 OT Individual Time: 6759-1638 OT Individual Time Calculation (min): 42 min    Short Term Goals: Week 1:  OT Short Term Goal 1 (Week 1): Pt will maintain static sitting balance EOB with mod A for 1 minute during ADL task OT Short Term Goal 2 (Week 1): Pt will attend to L visual field for ~30 seconds with moderate verbal cueing  OT Short Term Goal 3 (Week 1): Pt will initiate giving caregiver instructions with L UE positioning  OT Short Term Goal 4 (Week 1): Pt will don shirt at bed level with mod A  Skilled Therapeutic Interventions/Progress Updates:    Pt seen for OT session focusing on functional mobility and sitting balance. Pt in supine upon arrival, agreeable to tx session and denying pain. Donned pants from supine position with pt able to assist in pulling pants up once threaded over B LEs. She rolled with CGA and VCs to L, max A for functional roll to R in order to pull pants up. Upon getting pants on, she voiced need to void. Max A +2 for clothing management and place bedpan. Gas only, unable to void. She was able to complete modified bridge in order to have pants pulled up with +2 A. She transferred to sitting EOB with max A +1. Sitting balance EOB initially max-total A due to L lean and pushing to L. With verbal cue to reach for bed rail on R, pt able to maintain static sitting balance with R UE holding onto bed rail with min A, however, total LOB when she would let go of UE support.  Orientation assessed while seated EOB, oriented to all but day of week, difficult to re-orient 2/2 langauage deficits.  Addressed L in-attention having pt scan to L to locate television requiring max multi-modal cuing and significantly increased time. Unable to re-locate once attention taken from TV.  Pt requesting to return to supine throughout session, tolerating  ~15 minutes seated EOB with lots of encouragement and redirection required. Pt returned to supine total A, pt able to help pull herself up in bed using hospital bed functions VCs. Pt left sitting up in bed with all needs in reach and bed alarm on.  Therapy Documentation Precautions:  Precautions Precautions: Fall Precaution Comments: left inattention and right gaze, increased extensor tone LLE and Flexion tone LUE at shoulder and elbow Restrictions Weight Bearing Restrictions: No   Therapy/Group: Individual Therapy  Dermot Gremillion L 04/16/2018, 7:19 AM

## 2018-04-16 NOTE — Progress Notes (Signed)
Physical Therapy Session Note  Patient Details  Name: Jessica Meyer MRN: [id number] Date of Birth: June 30, 1935  Today's Date: 04/16/2018 PT Individual Time: 1145-1230 PT Individual Time Calculation (min): 45 min   Short Term Goals: Week 1:  PT Short Term Goal 1 (Week 1): Pt will perform bed<>chair transfer with Max assist +1 PT Short Term Goal 2 (Week 1): Pt will initiate gait training PT Short Term Goal 3 (Week 1): Pt will maintain static/dynamic sitting balance with CGA-min assist during functional tasks  Skilled Therapeutic Interventions/Progress Updates:     Patient in bed upon PT arrival. Patient alert and agreeable to PT session. Patient stated she needed to be cleaned up from a BM at beginning of session. Patient did not have a BM and was assisted to BM, see below for mobility details.  Therapeutic Activity: Bed Mobility: Patient performed supine to/from sit with max A. Provided verbal cues for pushing through R UE to sit up. Transfers: Patient performed a slide board transfer to/from the [facility] from the bed with max A of 2 people. Provided verbal cues for midline positioning and head-hips relationship. Patient had a BM on the [facility] and required total A for LB dressing and peri-care.   Therapeutic Exercise: Patient performed the following exercises with verbal and tactile cues for proper technique. -L hip and knee flexion in supine with AAROM with >75% assist x10 -L PROM shoulder flexion x10   Patient in bed at end of session with breaks locked, bed alarm set, and all needs within reach.    Therapy Documentation Precautions:  Precautions Precautions: Fall Precaution Comments: left inattention and right gaze, increased extensor tone LLE and Flexion tone LUE at shoulder and elbow Restrictions Weight Bearing Restrictions: No Pain: Pain Assessment Faces Pain Scale: Hurts even more Pain Type: Acute pain Pain Location: Buttocks Pain Orientation: Posterior Pain Descriptors /  Indicators: Aching Pain Onset: Sudden Pain Intervention(s): Repositioned   Therapy/Group: Individual Therapy  Helayne Seminole, PT, DPT 04/16/2018, 2:56 PM

## 2018-04-16 NOTE — Progress Notes (Signed)
[place] PHYSICAL MEDICINE & REHABILITATION PROGRESS NOTE  Subjective/Complaints:  Hard of hearing, asking for heating pad to RIght side neck Has Right gaze preference  ROS: Denies CP, shortness of breath, nausea, vomiting, diarrhea.  Objective: Vital Signs: Blood pressure 129/66, pulse 64, temperature 98.2 F (36.8 C), resp. rate 20, height 5\' 5"  (1.651 m), weight 66.2 kg, SpO2 97 %. No results found. No results for input(s): WBC, HGB, HCT, PLT in the last 72 hours. No results for input(s): NA, K, CL, CO2, GLUCOSE, BUN, CREATININE, CALCIUM in the last 72 hours.  Physical Exam: BP 129/66 (BP Location: Right Arm)   Pulse 64   Temp 98.2 F (36.8 C)   Resp 20   Ht 5\' 5"  (1.651 m)   Wt 66.2 kg   SpO2 97%   BMI 24.29 kg/m  Constitutional: Well-developed.  Well-nourished.  NAD.  Vital signs reviewed. HENT: Normocephalic.  Atraumatic. Eyes: EOMI.  No discharge. Cardiovascular: No JVD. Respiratory: Effort normal  GI: Nondistended Musculoskeletal: tenderness Right side of neck Neurological: She is somnolent this a.m.  Left-sided neglect   Follows commands.    Poor insight into deficits Dysarthria Motor: LLE: 0/5 proximal to distal 0/5 left upper extremity unchanged RLE: 5/5 proximal to distal Sensation absent to light touch LLE  Severe left neglect Poor attention and concentration Increased ankle plantar flexor tone MAS 3 Left elbow flexors Skin: Skin is warm and dry.  Psychiatric: Her affect is blunt. Her speech is delayed.   Assessment/Plan: 1. Functional deficits secondary to right MCA infarct which require 3+ hours per day of interdisciplinary therapy in a comprehensive inpatient rehab setting.  Physiatrist is providing close team supervision and 24 hour management of active medical problems listed below.  Physiatrist and rehab team continue to assess barriers to discharge/monitor patient progress toward functional and medical goals  Care Tool:  Bathing  Bathing activity did not occur: Safety/medical concerns Body parts bathed by patient: Chest, Abdomen, Face(pt did 25% of L arm)   Body parts bathed by helper: Left lower leg, Right lower leg, Left upper leg, Right upper leg, Right arm, Left arm Body parts n/a: Front perineal area, Buttocks(pt already cleansed by nursing)   Bathing assist       Upper Body Dressing/Undressing Upper body dressing   What is the patient wearing?: Pull over shirt    Upper body assist Assist Level: Maximal Assistance - Patient 25 - 49%    Lower Body Dressing/Undressing Lower body dressing    Lower body dressing activity did not occur: Safety/medical concerns What is the patient wearing?: Incontinence brief, Pants     Lower body assist Assist for lower body dressing: 2 Helpers     Toileting Toileting    Toileting assist Assist for toileting: 2 Helpers     Transfers Chair/bed transfer  Transfers assist  Chair/bed transfer activity did not occur: Safety/medical concerns  Chair/bed transfer assist level: 2 Helpers     Locomotion Ambulation   Ambulation assist   Ambulation activity did not occur: Safety/medical concerns          Walk 10 feet activity   Assist  Walk 10 feet activity did not occur: Safety/medical concerns        Walk 50 feet activity   Assist Walk 50 feet with 2 turns activity did not occur: Safety/medical concerns         Walk 150 feet activity   Assist Walk 150 feet activity did not occur: Safety/medical concerns  Walk 10 feet on uneven surface  activity   Assist Walk 10 feet on uneven surfaces activity did not occur: Safety/medical concerns         Wheelchair     Assist Will patient use wheelchair at discharge?: Yes Type of Wheelchair: Manual    Wheelchair assist level: Dependent - Patient 0% Max wheelchair distance: 150 ft    Wheelchair 50 feet with 2 turns activity    Assist        Assist Level: Dependent -  Patient 0%   Wheelchair 150 feet activity     Assist     Assist Level: Dependent - Patient 0%      Medical Problem List and Plan: 1.  Left side weakness with dysphagia secondary to right MCA infarction in the setting of right ICA and right MCA occlusion status post unsuccessful attempts at thrombectomy. CIR PT, OT, SLP.Marland Kitchen.2.  Antithrombotics: -DVT/anticoagulation:  SCDs             -antiplatelet therapy: aspirin 81 mg daily, Plavix 75 mg daily 3. Pain Management:  Tylenol 3 for headache as needed, flank pain has subsided, right-sided neck pain is the current complaint.  Will change Tylenol 3 to Tylenol for added Robaxin discontinue gabapentin.  4. Mood: Wellbutrin 300 mg daily,Lexapro 5 mg daily at bedtime             -antipsychotic agents: N/A 5. Neuropsych: This patient is not fully capable of making decisions on her own behalf. 6. Skin/Wound Care:  Routine skin checks 7. Fluids/Electrolytes/Nutrition:  Routine in and out's with follow-up chemistries tomorrow. 8. Dysphagia. Dysphagia #1 nectar liquids. Follow-up speech therapy             Advance diet as tolerated 9. Hypertension. Norvasc 5 mg daily,lisinopril 40 mg daily, Coreg 12.5 mg twice a day.   Monitor with increased mobility 10. Diabetes mellitus. Hemoglobin A1c 8.0. Glucotrol XL 2.5 mg daily, Lantus insulin 12 units daily at bedtime. Check blood sugars before meals and at bedtime.      CBG (last 3)  Recent Labs    04/15/18 1653 04/15/18 2118 04/16/18 0629  GLUCAP 176* 111* 150*  improving 11. Hyperlipidemia. Lipitor 12. Hypothyroidism. Continue Synthroid 13.  Acute blood loss anemia:              Hemoglobin 9.3 on 4/4 follow-up tomorrow 14.  CKD stage III:             Creatinine 1.11 on 4/4. 1.22 on 4/6 15.  Leukocytosis-afeb     LOS: 9 days A FACE TO FACE EVALUATION WAS PERFORMED  Erick Colacendrew E Kathaleen Dudziak 04/16/2018, 8:16 AM

## 2018-04-16 NOTE — Progress Notes (Signed)
Speech Language Pathology Note  Patient Details  Name: Jessica Meyer MRN: [id number] Date of Birth: 04/28/1935 Today's Date: 04/16/2018  SLP contacted pt's daughter [name] and her grandson was also included in the call. SLP was made aware that family had questions on how to facilitate remote conversations etc. This Clinical research associate provided information regarding severe cognitive impairments that impact all aspects of daily function (attention, following simple directions, awareness, task initiation, left inattention, perception of self and body in space) as well as impact of cognitive deficits on progress within interdisciplinary therapies. Swallow dysfunction explained and rationale for diet modifications reviewed. Family had questions regarding left inattention vs field cut, appropriateness of formal cognitive testing (such as MOCA) and physical impairments (specifically tone, pt's ability to put weight thru left). This Clinical research associate explained left inattention within context of function (I.e., pt doesn't turn head past midline to locate items on left). I also explained that pt is not cognitively appropriate for clock drawing etc to assess for field cut or to participate in testing such as MOCA. As patient's mentation improves (attention, ability to follow simple directions) will reassess possibility of formal assessment.   Family had questions regarding prognosis and progress. This Clinical research associate explained that pt had made very slow minimal functional progress this past reporting period. Given that pt's impairments encompass all modalities of daily living, this writer recommended viewing pt's progress as "month to month progress" and not "week to week progress."   No bill for this encounter.    Dryden Tapley 04/16/2018, 3:11 PM

## 2018-04-17 ENCOUNTER — Inpatient Hospital Stay (HOSPITAL_COMMUNITY): Payer: Medicare Other

## 2018-04-17 ENCOUNTER — Inpatient Hospital Stay (HOSPITAL_COMMUNITY): Payer: Medicare Other | Admitting: Physical Therapy

## 2018-04-17 ENCOUNTER — Telehealth: Payer: Self-pay | Admitting: Family

## 2018-04-17 ENCOUNTER — Inpatient Hospital Stay (HOSPITAL_COMMUNITY): Payer: Medicare Other | Admitting: Speech Pathology

## 2018-04-17 LAB — GLUCOSE, CAPILLARY
Glucose-Capillary: 134 mg/dL — ABNORMAL HIGH (ref 70–99)
Glucose-Capillary: 199 mg/dL — ABNORMAL HIGH (ref 70–99)
Glucose-Capillary: 149 mg/dL — ABNORMAL HIGH (ref 70–99)
Glucose-Capillary: 199 mg/dL — ABNORMAL HIGH (ref 70–99)

## 2018-04-17 NOTE — Telephone Encounter (Signed)
See mychart.  

## 2018-04-17 NOTE — Progress Notes (Signed)
Speech Language Pathology Daily Session Note  Patient Details  Name: Jessica Meyer MRN: [id number] Date of Birth: 1935/10/30  Today's Date: 04/17/2018 SLP Individual Time: 0800-0900 SLP Individual Time Calculation (min): 60 min  Short Term Goals: Week 2: SLP Short Term Goal 1 (Week 2): Pt will demonstrate sustained attention to task for ~ 4 minutes with Min A cues.  SLP Short Term Goal 2 (Week 2): Pt will scan to left of midline to locate items in 8 out of 10 opportunities with Mod A cues.  SLP Short Term Goal 3 (Week 2): Pt will complete basic familiar problem solving tasks with Min A cues.  SLP Short Term Goal 4 (Week 2): Pt will consume current diet with minimal s/s of aspiration and Mod A cues for use of compensatory swallow strategies.   Skilled Therapeutic Interventions: Pt was seen for skilled ST intervention focused on goals for increased swallow function and safety, and improved cognition. Upon arrival of SLP, pt was seated upright in bed with breakfast tray. Nurse tech was present initially. Pt was pleasant and cooperative with unfamiliar therapist.  Dysphagia: Pt was observed during breakfast, with mod-max cues given to limit bolus size and rate, and to increase attention to the left during self feeding and finding items on her tray. Left anterior spillage and residue was noted on puree consistencies, and liquids remained on top lip after drinking from the cup. Use of straw was not documented as contraindicated, and this decreased facial residue of liquids. No cough noted during this session, although pt did report that she was choking x1. No obvious s/s aspiration observed, no change in voice quality, and no regurgitation noted. Pt was able to drink some milk, and stated she felt better.   Cognition: Pt required mod verbal cues to determine how to move items around on her tray so she could reach the bowl of grits. SLP provided cues for sequencing. SLP facilitated attention to the  left by posting a sign on the wall opposite pt, reminding her to "LOOK TO THE LEFT". This was placed just beneath a picture of her family. Pt required max cues to name all family members in the picture - Pt began naming family on the far right of the picture, and required consistent cues to keep looking left to identify all those pictured.   Pain Pain Assessment Pain Scale: 0-10 Pain Score: 0-No pain Faces Pain Scale: No hurt  Therapy/Group: Individual Therapy  Karin Pinedo B. Murvin Natal, [facility], CCC-SLP Speech Language Pathologist  Leigh Aurora 04/17/2018, 10:22 AM

## 2018-04-17 NOTE — Progress Notes (Signed)
Occupational Therapy Session Note  Patient Details  Name: Jessica Meyer MRN: [id number] Date of Birth: 10/27/1935  Today's Date: 04/17/2018 OT Individual Time: 0930-1027 OT Individual Time Calculation (min): 57 min    Short Term Goals: Week 2:  OT Short Term Goal 1 (Week 2): Pt will maintain static sitting balance on EOB with max A for 1 minute OT Short Term Goal 2 (Week 2): Pt will attend to L visual field to locate self care items with max cuing.   Skilled Therapeutic Interventions/Progress Updates:    Pt received supine in bed with no initial c/o pain. During session pt c/o pain in LLE with movement. Pt completed peri hygiene at bed level with (S). Pt able to roll R and L with mod A. Throughout session pt required max cueing for midline orientation and L attention. Pt washed UB at bed level with mod A for washing under R and L UE. Pt completed BLE washing with a LH sponge while sitting upright with bed support. Pt donned shirt and pants with max A. Pt completed bed mobility to transition to EOB with total A. While EOB pt had extremely strong extensor tone in LLE and had multiple instance of almost sliding off bed, with +2 total A to lift back to bed.  Multiple attempts to complete slideboard transfer, max A +2. Pt was assisted in washing hair sitting up in TIS w/c with max A. Pt was left sitting up with all needs met, chair alarm set.   Therapy Documentation Precautions:  Precautions Precautions: Fall Precaution Comments: left inattention and right gaze, increased extensor tone LLE and Flexion tone LUE at shoulder and elbow Restrictions Weight Bearing Restrictions: No Pain: Pain Assessment Pain Scale: 0-10 Pain Score: 0-No pain Faces Pain Scale: No hurt  Therapy/Group: Individual Therapy   H  04/17/2018, 11:35 AM  

## 2018-04-17 NOTE — Progress Notes (Signed)
[facility] PHYSICAL MEDICINE & REHABILITATION PROGRESS NOTE  Subjective/Complaints: Right  Sided neck pain , no pain down the arm, no numbness tingling on RIght , has sensorimotor loss on left due to CVA  ROS: Denies CP, shortness of breath, nausea, vomiting, diarrhea.  Objective: Vital Signs: Blood pressure 128/62, pulse 60, temperature 98.1 F (36.7 C), resp. rate 19, height 5\' 5"  (1.651 m), weight 66.2 kg, SpO2 98 %. No results found. Recent Labs    04/16/18 0908  WBC 9.8  HGB 10.8*  HCT 33.2*  PLT 366   No results for input(s): NA, K, CL, CO2, GLUCOSE, BUN, CREATININE, CALCIUM in the last 72 hours.  Physical Exam: BP 128/62 (BP Location: Right Arm)   Pulse 60   Temp 98.1 F (36.7 C)   Resp 19   Ht 5\' 5"  (1.651 m)   Wt 66.2 kg   SpO2 98%   BMI 24.29 kg/m  Constitutional: Well-developed.  Well-nourished.  NAD.  Vital signs reviewed. HENT: Normocephalic.  Atraumatic. Eyes: EOMI.  No discharge. Cardiovascular: No JVD. Respiratory: Effort normal  GI: Nondistended Musculoskeletal: tenderness Right trapezius Neurological: She is somnolent this a.m.  Left-sided neglect   Follows commands.    Poor insight into deficits Dysarthria Motor: LLE: 0/5 proximal to distal 0/5 left upper extremity unchanged RLE: 5/5 proximal to distal Sensation absent to light touch LLE  Severe left neglect Poor attention and concentration Increased ankle plantar flexor tone MAS 3 Left elbow flexors Skin: Skin is warm and dry.  Psychiatric: Her affect is blunt. Her speech is delayed.   Assessment/Plan: 1. Functional deficits secondary to right MCA infarct which require 3+ hours per day of interdisciplinary therapy in a comprehensive inpatient rehab setting.  Physiatrist is providing close team supervision and 24 hour management of active medical problems listed below.  Physiatrist and rehab team continue to assess barriers to discharge/monitor patient progress toward functional and  medical goals  Care Tool:  Bathing  Bathing activity did not occur: Safety/medical concerns Body parts bathed by patient: Chest, Abdomen, Face(pt did 25% of L arm)   Body parts bathed by helper: Left lower leg, Right lower leg, Left upper leg, Right upper leg, Right arm, Left arm Body parts n/a: Front perineal area, Buttocks(pt already cleansed by nursing)   Bathing assist       Upper Body Dressing/Undressing Upper body dressing   What is the patient wearing?: Pull over shirt    Upper body assist Assist Level: Maximal Assistance - Patient 25 - 49%    Lower Body Dressing/Undressing Lower body dressing    Lower body dressing activity did not occur: Safety/medical concerns What is the patient wearing?: Incontinence brief, Pants     Lower body assist Assist for lower body dressing: 2 Helpers     Toileting Toileting    Toileting assist Assist for toileting: 2 Helpers     Transfers Chair/bed transfer  Transfers assist  Chair/bed transfer activity did not occur: Safety/medical concerns  Chair/bed transfer assist level: 2 Helpers     Locomotion Ambulation   Ambulation assist   Ambulation activity did not occur: Safety/medical concerns          Walk 10 feet activity   Assist  Walk 10 feet activity did not occur: Safety/medical concerns        Walk 50 feet activity   Assist Walk 50 feet with 2 turns activity did not occur: Safety/medical concerns         Walk 150 feet activity  Assist Walk 150 feet activity did not occur: Safety/medical concerns         Walk 10 feet on uneven surface  activity   Assist Walk 10 feet on uneven surfaces activity did not occur: Safety/medical concerns         Wheelchair     Assist Will patient use wheelchair at discharge?: Yes Type of Wheelchair: Manual    Wheelchair assist level: Dependent - Patient 0% Max wheelchair distance: 150 ft    Wheelchair 50 feet with 2 turns  activity    Assist        Assist Level: Dependent - Patient 0%   Wheelchair 150 feet activity     Assist     Assist Level: Dependent - Patient 0%      Medical Problem List and Plan: 1.  Left side weakness with dysphagia secondary to right MCA infarction in the setting of right ICA and right MCA occlusion status post unsuccessful attempts at thrombectomy. CIR PT, OT, SLP. Team conf in am.2.  Antithrombotics: -DVT/anticoagulation:  SCDs             -antiplatelet therapy: aspirin 81 mg daily, Plavix 75 mg daily 3. Pain Management:  Tylenol 3 for headache as needed, flank pain has subsided, right-sided neck pain is the current complaint. Likely cervical spondylosis +/- DDD exacerbated by neck positioning from Left neglect Check xray (pt requesting)  Tylenol 4 added Robaxin discontinue gabapentin.  4. Mood: Wellbutrin 300 mg daily,Lexapro 5 mg daily at bedtime             -antipsychotic agents: N/A 5. Neuropsych: This patient is not fully capable of making decisions on her own behalf. 6. Skin/Wound Care:  Routine skin checks 7. Fluids/Electrolytes/Nutrition:  Routine in and out's with follow-up chemistries tomorrow. 8. Dysphagia. Dysphagia #1 nectar liquids. Follow-up speech therapy             Advance diet as tolerated 9. Hypertension. Norvasc 5 mg daily,lisinopril 40 mg daily, Coreg 12.5 mg twice a day.   Monitor with increased mobility 10. Diabetes mellitus. Hemoglobin A1c 8.0. Glucotrol XL 2.5 mg daily, Lantus insulin 12 units daily at bedtime. Check blood sugars before meals and at bedtime.      CBG (last 3)  Recent Labs    04/16/18 1712 04/16/18 2112 04/17/18 0608  GLUCAP 136* 219* 134*  improving consider increase Glucotrol XL 11. Hyperlipidemia. Lipitor 12. Hypothyroidism. Continue Synthroid 13.  Acute blood loss anemia:              Hemoglobin 9.3 on 4/4 follow-up tomorrow 14.  CKD stage III:             Creatinine 1.11 on 4/4. 1.22 on 4/6 15.   Leukocytosis-resolved     LOS: 10 days A FACE TO FACE EVALUATION WAS PERFORMED  Erick Colacendrew E Eleshia Wooley 04/17/2018, 7:47 AM

## 2018-04-17 NOTE — Progress Notes (Signed)
Pt transported to xray via bed. Side rails x 4. Will f/up when return.  Ross Ludwig, LPN

## 2018-04-17 NOTE — Progress Notes (Signed)
Pt returned to room via bed. Call bell placed in reach. Bed alarm present and bed in lowest position. Will cont to monitor   Ross Ludwig, LPN

## 2018-04-17 NOTE — Progress Notes (Signed)
Physical Therapy Session Note  Patient Details  Name: Atalie Carrio MRN: [id number] Date of Birth: [birth year]  Today's Date: 04/17/2018 PT Individual Time: 0272-5366 PT Individual Time Calculation (min): 75 min   Short Term Goals: Week 2:  PT Short Term Goal 1 (Week 2): Pt will perform sit<>stand using LRAD with mod A of +2 PT Short Term Goal 2 (Week 2): Pt will perform bed<>chair transfers with Max assist of +1 PT Short Term Goal 3 (Week 2): Pt will maintain static/dynamic sitting balance with min A during functional tasks.  Skilled Therapeutic Interventions/Progress Updates: Pt presented in TIS chair accepting of therapy. Cues for pt to maintain midline as PTA braided hair to decrease distraction. Pt transported to gym and performed SB transfer to L maxA x 2 downhill PTA blocking LLE to minimize extensor tone. With max mulitmodal cues to able to lean to R onto elbow and able to maintain while counting to 10. Pt then provided with visual feedback to return to midline requiring maxA for transition and maxA to sustain midline. Pt participated in reaching activities to R for L trunk elongation and minimize pushing tendencies. Pt intermittently able to initiate lean to L towards midline however would demonstrate increased posterior lean. Pt then indicating urinary urgency, performed SB transfer to R max/total A x 2 and pt transported back to room. Pt then performed SB transfer to bed maxA x 2. Pt performed rolling L/R maxA x 1 for clothing management and bedpan placement with max cues for sequencing due to pt's increased impulsiveness (-void). Pt also able to perform small bridge with PTA blocking/stabilizing LLE to minimize extensor tone while PTA pulls pants over hips. Pt then performed supine to sit maxA to EOB and performed SB transfer back to TIS total A x 2. Pt transported to [employer] and participated in [place] mode A from upright TIS chair with forced reaching to R. Pt was able to sustain  activity for 3 min with avg reaction time of 3 sec and pt requiring x 1 heavy mod A for correction to midline due to excessive posterior lean. Pt transported back to room at end of session and left in TIS for lunch with belt alarm on, call bell within reach, hot pack on back  and NT present checking BS.      Therapy Documentation Precautions:  Precautions Precautions: Fall Precaution Comments: left inattention and right gaze, increased extensor tone LLE and Flexion tone LUE at shoulder and elbow Restrictions Weight Bearing Restrictions: No General:   Vital Signs: Therapy Vitals Temp: 98.2 F (36.8 C) Pulse Rate: 69 Resp: 20 BP: (!) 120/49 Patient Position (if appropriate): Lying Oxygen Therapy SpO2: 97 % O2 Device: Room Air Pain: Pain Assessment Pain Scale: Faces Pain Score: Asleep Faces Pain Scale: Hurts even more Pain Type: Acute pain Pain Location: Neck Pain Orientation: Right Pain Radiating Towards: shoulder Pain Descriptors / Indicators: Aching Pain Frequency: Constant Pain Onset: On-going Patients Stated Pain Goal: 3 Pain Intervention(s): Medication (See eMAR)   Therapy/Group: Individual Therapy  Kaneisha Ellenberger  Braden Cimo, PTA  04/17/2018, 3:37 PM

## 2018-04-18 ENCOUNTER — Inpatient Hospital Stay (HOSPITAL_COMMUNITY): Payer: Medicare Other | Admitting: Speech Pathology

## 2018-04-18 ENCOUNTER — Inpatient Hospital Stay (HOSPITAL_COMMUNITY): Payer: Medicare Other | Admitting: Physical Therapy

## 2018-04-18 ENCOUNTER — Inpatient Hospital Stay (HOSPITAL_COMMUNITY): Payer: Medicare Other | Admitting: Occupational Therapy

## 2018-04-18 LAB — GLUCOSE, CAPILLARY
Glucose-Capillary: 141 mg/dL — ABNORMAL HIGH (ref 70–99)
Glucose-Capillary: 149 mg/dL — ABNORMAL HIGH (ref 70–99)
Glucose-Capillary: 185 mg/dL — ABNORMAL HIGH (ref 70–99)
Glucose-Capillary: 242 mg/dL — ABNORMAL HIGH (ref 70–99)

## 2018-04-18 MED ORDER — HYDROCODONE-ACETAMINOPHEN 5-325 MG PO TABS
1.0000 | ORAL_TABLET | Freq: Four times a day (QID) | ORAL | Status: DC | PRN
  Administered 2018-04-18 – 2018-05-04 (×40): 1 via ORAL
  Filled 2018-04-18 (×44): qty 1

## 2018-04-18 NOTE — Progress Notes (Signed)
Speech Language Pathology Daily Session Note  Patient Details  Name: Jessica Meyer MRN: [id number] Date of Birth: 13-Sep-1935  Today's Date: 04/18/2018 SLP Individual Time: 6811-5726 SLP Individual Time Calculation (min): 59 min  Short Term Goals: Week 2: SLP Short Term Goal 1 (Week 2): Pt will demonstrate sustained attention to task for ~ 4 minutes with Min A cues.  SLP Short Term Goal 2 (Week 2): Pt will scan to left of midline to locate items in 8 out of 10 opportunities with Mod A cues.  SLP Short Term Goal 3 (Week 2): Pt will complete basic familiar problem solving tasks with Min A cues.  SLP Short Term Goal 4 (Week 2): Pt will consume current diet with minimal s/s of aspiration and Mod A cues for use of compensatory swallow strategies.   Skilled Therapeutic Interventions: Patient received skilled SLP services targeting dysphagia and cognitive deficits. PO trials of nectar thick liquids were completed via teaspoon and cup sips. With teaspoon and single cup sips of nectar thick liquids no overt s/sx of aspiration were observed. S/sx of aspiration were observed x1 including immediate cough with large consecutive cup sips of nectar thick liquids. Patient responded to functional problem solving scenarios for the home environment with 50% accuracy requiring mod verbal cues. Patient continues to present with reduced insight into deficits believing she is safe to get up by herself. Patient scanned to left of midline to locate items in room with 80% accuracy and min verbal cues. At end of therapy session patient was left upright in chair with chair alarm on. Patient's call bell within reach. Patient would could continue to benefit from skilled SLP services during rehab stay.  Pain Pain Assessment Pain Scale: Faces Pain Score: 9  Faces Pain Scale: Hurts whole lot Pain Location: Neck Pain Orientation: Right Pain Descriptors / Indicators: Aching Pain Frequency: Constant Pain Onset:  On-going Patients Stated Pain Goal: 4 Pain Intervention(s): Medication (See eMAR) Multiple Pain Sites: No  Therapy/Group: Individual Therapy  Arnette Schaumann 04/18/2018, 2:16 PM

## 2018-04-18 NOTE — Progress Notes (Signed)
Physical Therapy Session Note  Patient Details  Name: Jessica Meyer MRN: [id number] Date of Birth: [birth year]  Today's Date: 04/18/2018 PT Individual Time: 1100-1200 PT Individual Time Calculation (min): 60 min   Short Term Goals: Week 1:  PT Short Term Goal 1 (Week 1): Pt will perform bed<>chair transfer with Max assist +1 PT Short Term Goal 1 - Progress (Week 1): Not met PT Short Term Goal 2 (Week 1): Pt will initiate gait training PT Short Term Goal 2 - Progress (Week 1): Not met PT Short Term Goal 3 (Week 1): Pt will maintain static/dynamic sitting balance with CGA-min assist during functional tasks PT Short Term Goal 3 - Progress (Week 1): Not met Week 2:  PT Short Term Goal 1 (Week 2): Pt will perform sit<>stand using LRAD with mod A of +2 PT Short Term Goal 2 (Week 2): Pt will perform bed<>chair transfers with Max assist of +1 PT Short Term Goal 3 (Week 2): Pt will maintain static/dynamic sitting balance with min A during functional tasks.  Skilled Therapeutic Interventions/Progress Updates:   Pt received sitting in WC and agreeable to PT. Sitting balance Edge of seat with total assist due to poor midline awareness, poor LE positioning, active resistance to corrective positioning from PT and to prevent sliding out of WC. Sit>stand x 3 with max-total assist from PT for force WB through the LLE, attention to mirror and external cues to weight shift R by putting R shoulder on wall. Increased time for all transfers and to improve awareness of proper sitting balance once returned to seated Pt returned to room and performed squat pivot transfer to bed with total assist to prevent LOB to the L. Sit>supine completed with  Total A +2 and left supine in bed with call bell in reach and all needs met.        Therapy Documentation Precautions:  Precautions Precautions: Fall Precaution Comments: left inattention and right gaze, increased extensor tone LLE and Flexion tone LUE at shoulder  and elbow Restrictions Weight Bearing Restrictions: No    Pain: Pain Assessment Pain Scale: Faces Pain Score: Asleep    Therapy/Group: Individual Therapy  Lorie Phenix 04/18/2018, 1:05 PM

## 2018-04-18 NOTE — Progress Notes (Signed)
Speech Language Pathology Daily Session Note  Patient Details  Name: Jessica Meyer MRN: [id number] Date of Birth: 10-16-1935  Today's Date: 04/18/2018 SLP Individual Time: 8416-6063 SLP Individual Time Calculation (min): 25 min  Short Term Goals: Week 2: SLP Short Term Goal 1 (Week 2): Pt will demonstrate sustained attention to task for ~ 4 minutes with Min A cues.  SLP Short Term Goal 2 (Week 2): Pt will scan to left of midline to locate items in 8 out of 10 opportunities with Mod A cues.  SLP Short Term Goal 3 (Week 2): Pt will complete basic familiar problem solving tasks with Min A cues.  SLP Short Term Goal 4 (Week 2): Pt will consume current diet with minimal s/s of aspiration and Mod A cues for use of compensatory swallow strategies.   Skilled Therapeutic Interventions: Patient received skilled SLP services to address dysphagia and cognitive deficits. PO trials of thin liquids, nectar thick liquids, and dysphagia 2 textures were completed to determine readiness for repeat instrumental evaluation of swallowing. Of note, during previous MBS completed on 04/09/18 patient was observed to have audibile aspiration. Patient was re-positioned upright in bed prior to PO trials. Patient demonstrated mildly prolonged mastication of ground consistency with min lingual residues which cleared following liquid wash. No overt s/sx of aspiration were observed with thin liquids, nectar thick liquids, or dysphagia 2 textures. Recommend repeat MBS to further assess current pharyngeal swallow function. Functional visual scanning was completed in patient's room with patient identifying items beside her window with min verbal cues.  Pain Pain Assessment Pain Scale: 0-10 Pain Score: 5  Faces Pain Scale: Hurts whole lot Pain Location: Back Pain Orientation: Right Pain Descriptors / Indicators: Aching Pain Frequency: Constant Pain Onset: On-going Patients Stated Pain Goal: 4 Pain Intervention(s): Medication  (See eMAR);RN made aware Multiple Pain Sites: No  Therapy/Group: Individual Therapy  Arnette Schaumann 04/18/2018, 2:20 PM

## 2018-04-18 NOTE — Plan of Care (Signed)
PT downgraded LT due to slow progress, continued poor awareness, pushers syndrome, and safety concerns. See care plan for details

## 2018-04-18 NOTE — Progress Notes (Signed)
Occupational Therapy Session Note  Patient Details  Name: Jessica Meyer MRN: [id number] Date of Birth: 1935/11/03  Today's Date: 04/18/2018 OT Individual Time: 0211-1552 OT Individual Time Calculation (min): 31 min  and Today's Date: 04/18/2018 OT Missed Time: 45 Minutes Missed Time Reason: Pain   Short Term Goals: Week 2:  OT Short Term Goal 1 (Week 2): Pt will maintain static sitting balance on EOB with max A for 1 minute OT Short Term Goal 2 (Week 2): Pt will attend to L visual field to locate self care items with max cuing.   Skilled Therapeutic Interventions/Progress Updates:    Upon entering the room, pt with c/o neck pain described as pulling and tightness. Pt grimacing while laying in bed. OT repositioned in bed for neck pain and to protect hemiplegic side. Muscle rub applied to painful area and k pad donned. Pt continuing to cry out and grimace. RN notified for pain management. Pt remained in bed with bed alarm activated and call bell within reach.   Therapy Documentation Precautions:  Precautions Precautions: Fall Precaution Comments: left inattention and right gaze, increased extensor tone LLE and Flexion tone LUE at shoulder and elbow Restrictions Weight Bearing Restrictions: No General: General OT Amount of Missed Time: 45 Minutes Vital Signs:   Pain: Pain Assessment Pain Scale: Faces Pain Score: Asleep Faces Pain Scale: Hurts little more Pain Location: Neck Pain Orientation: Right Pain Descriptors / Indicators: Aching Pain Frequency: Constant Pain Onset: On-going Pain Intervention(s): Heat applied ADL: ADL Eating: Maximal assistance Where Assessed-Eating: Bed level Grooming: Moderate assistance Where Assessed-Grooming: Bed level Upper Body Bathing: Maximal assistance Where Assessed-Upper Body Bathing: Bed level Lower Body Bathing: Maximal assistance Where Assessed-Lower Body Bathing: Bed level Upper Body Dressing: Maximal assistance Where  Assessed-Upper Body Dressing: Bed level Lower Body Dressing: Dependent Where Assessed-Lower Body Dressing: Bed level Toileting: Dependent Where Assessed-Toileting: Bed level Toilet Transfer: Unable to assess   Therapy/Group: Individual Therapy  Alen Bleacher 04/18/2018, 8:38 AM

## 2018-04-18 NOTE — Progress Notes (Signed)
[place] PHYSICAL MEDICINE & REHABILITATION PROGRESS NOTE  Subjective/Complaints: Right  Sided neck pain , no pain down the arm, no numbness tingling on RIght , has sensorimotor loss on left due to CVA-unchanged  Discussed  Xray results and that arthritis and degenerative changes have been there for years bu aggravated by her neck positioning after stroke  ROS: Denies CP, shortness of breath, nausea, vomiting, diarrhea.  Objective: Vital Signs: Blood pressure 111/63, pulse 68, temperature 98.3 F (36.8 C), temperature source Oral, resp. rate 15, height 5\' 5"  (1.651 m), weight 66.1 kg, SpO2 94 %. Dg Cervical Spine Complete  Result Date: 04/17/2018 CLINICAL DATA:  83 year old female with right-sided neck pain EXAM: CERVICAL SPINE - COMPLETE 4+ VIEW COMPARISON:  CTA neck 04/04/2018 FINDINGS: No evidence of acute fracture or malalignment. Multilevel degenerative disc disease most severe at C2-C3 and C5-C6. No significant interval change comparing across modalities to the prior CT scan. No prevertebral soft tissue swelling. Carotid artery calcifications are noted incidentally. IMPRESSION: 1. No acute fracture or malalignment. 2. Multilevel degenerative disc disease most severe at C2-C3 and C5-C6. Electronically Signed   By: Malachy MoanHeath  McCullough M.D.   On: 04/17/2018 14:20   Recent Labs    04/16/18 0908  WBC 9.8  HGB 10.8*  HCT 33.2*  PLT 366   No results for input(s): NA, K, CL, CO2, GLUCOSE, BUN, CREATININE, CALCIUM in the last 72 hours.  Physical Exam: BP 111/63 (BP Location: Right Arm)   Pulse 68   Temp 98.3 F (36.8 C) (Oral)   Resp 15   Ht 5\' 5"  (1.651 m)   Wt 66.1 kg   SpO2 94%   BMI 24.26 kg/m  Constitutional: Well-developed.  Well-nourished.  NAD.  Vital signs reviewed. HENT: Normocephalic.  Atraumatic. Eyes: EOMI.  No discharge. Cardiovascular: No JVD. Respiratory: Effort normal  GI: Nondistended Musculoskeletal: tenderness Right trapezius Neurological: She is  somnolent this a.m.  Left-sided neglect   Follows commands.    Poor insight into deficits Dysarthria Motor: LLE: 0/5 proximal to distal 0/5 left upper extremity unchanged RLE: 5/5 proximal to distal Sensation absent to light touch LLE  Severe left neglect Poor attention and concentration Increased ankle plantar flexor tone MAS 3 Left elbow flexors Skin: Skin is warm and dry.  Psychiatric: Her affect is blunt. Her speech is delayed.   Assessment/Plan: 1. Functional deficits secondary to right MCA infarct which require 3+ hours per day of interdisciplinary therapy in a comprehensive inpatient rehab setting.  Physiatrist is providing close team supervision and 24 hour management of active medical problems listed below.  Physiatrist and rehab team continue to assess barriers to discharge/monitor patient progress toward functional and medical goals  Care Tool:  Bathing  Bathing activity did not occur: Safety/medical concerns Body parts bathed by patient: Chest, Abdomen, Face(pt did 25% of L arm)   Body parts bathed by helper: Left lower leg, Right lower leg, Left upper leg, Right upper leg, Right arm, Left arm Body parts n/a: Front perineal area, Buttocks(pt already cleansed by nursing)   Bathing assist       Upper Body Dressing/Undressing Upper body dressing   What is the patient wearing?: Pull over shirt    Upper body assist Assist Level: Maximal Assistance - Patient 25 - 49%    Lower Body Dressing/Undressing Lower body dressing    Lower body dressing activity did not occur: Safety/medical concerns What is the patient wearing?: Incontinence brief, Pants     Lower body assist Assist for lower  body dressing: 2 Manufacturing systems engineer Assist for toileting: 2 Helpers     Transfers Chair/bed transfer  Transfers assist  Chair/bed transfer activity did not occur: Safety/medical concerns  Chair/bed transfer assist level: 2  Helpers(slideboard)     Locomotion Ambulation   Ambulation assist   Ambulation activity did not occur: Safety/medical concerns          Walk 10 feet activity   Assist  Walk 10 feet activity did not occur: Safety/medical concerns        Walk 50 feet activity   Assist Walk 50 feet with 2 turns activity did not occur: Safety/medical concerns         Walk 150 feet activity   Assist Walk 150 feet activity did not occur: Safety/medical concerns         Walk 10 feet on uneven surface  activity   Assist Walk 10 feet on uneven surfaces activity did not occur: Safety/medical concerns         Wheelchair     Assist Will patient use wheelchair at discharge?: Yes Type of Wheelchair: Manual    Wheelchair assist level: Dependent - Patient 0% Max wheelchair distance: 150 ft    Wheelchair 50 feet with 2 turns activity    Assist        Assist Level: Dependent - Patient 0%   Wheelchair 150 feet activity     Assist     Assist Level: Dependent - Patient 0%      Medical Problem List and Plan: 1.  Left side weakness with dysphagia secondary to right MCA infarction in the setting of right ICA and right MCA occlusion status post unsuccessful attempts at thrombectomy. CIR PT, OT, SLP. Team conf in am.2.  Antithrombotics: -DVT/anticoagulation:  SCDs             -antiplatelet therapy: aspirin 81 mg daily, Plavix 75 mg daily 3. Pain Management:  Tylenol 3 for headache as needed, flank pain has subsided, right-sided neck pain is the current complaint. Xray confirmed cervical spondylosis +/- DDD exacerbated by neck positioning from Left neglect Kpad , sportscreme Robaxin change T#4 to hydrocodone 4. Mood: Wellbutrin 300 mg daily,Lexapro 5 mg daily at bedtime             -antipsychotic agents: N/A 5. Neuropsych: This patient is not fully capable of making decisions on her own behalf. 6. Skin/Wound Care:  Routine skin checks 7.  Fluids/Electrolytes/Nutrition:  Routine in and out's with follow-up chemistries tomorrow. 8. Dysphagia. Dysphagia #1 nectar liquids. Follow-up speech therapy             Advance diet as tolerated 9. Hypertension. Norvasc 5 mg daily,lisinopril 40 mg daily, Coreg 12.5 mg twice a day.   Monitor with increased mobility 10. Diabetes mellitus. Hemoglobin A1c 8.0. Glucotrol XL 2.5 mg daily, Lantus insulin 12 units daily at bedtime. Check blood sugars before meals and at bedtime.      CBG (last 3)  Recent Labs    04/17/18 1717 04/17/18 2112 04/18/18 0623  GLUCAP 149* 199* 141*  improving consider increase Glucotrol XL 11. Hyperlipidemia. Lipitor 12. Hypothyroidism. Continue Synthroid 13.  Acute blood loss anemia:              Hemoglobin 9.3 on 4/4 follow-up tomorrow 14.  CKD stage III:             Creatinine 1.11 on 4/4. 1.22 on 4/6 15.  Leukocytosis-resolved  LOS: 11 days A FACE TO FACE EVALUATION WAS PERFORMED  Erick Colace 04/18/2018, 9:26 AM

## 2018-04-19 ENCOUNTER — Inpatient Hospital Stay (HOSPITAL_COMMUNITY): Payer: Medicare Other

## 2018-04-19 ENCOUNTER — Inpatient Hospital Stay (HOSPITAL_COMMUNITY): Payer: Medicare Other | Admitting: Speech Pathology

## 2018-04-19 ENCOUNTER — Inpatient Hospital Stay (HOSPITAL_COMMUNITY): Payer: Medicare Other | Admitting: Occupational Therapy

## 2018-04-19 ENCOUNTER — Encounter (HOSPITAL_COMMUNITY): Payer: Medicare Other | Admitting: Speech Pathology

## 2018-04-19 ENCOUNTER — Inpatient Hospital Stay (HOSPITAL_COMMUNITY): Payer: Medicare Other | Admitting: Physical Therapy

## 2018-04-19 LAB — GLUCOSE, CAPILLARY
Glucose-Capillary: 159 mg/dL — ABNORMAL HIGH (ref 70–99)
Glucose-Capillary: 219 mg/dL — ABNORMAL HIGH (ref 70–99)
Glucose-Capillary: 121 mg/dL — ABNORMAL HIGH (ref 70–99)
Glucose-Capillary: 127 mg/dL — ABNORMAL HIGH (ref 70–99)

## 2018-04-19 MED ORDER — GLIMEPIRIDE 2 MG PO TABS
1.0000 mg | ORAL_TABLET | Freq: Every day | ORAL | Status: DC
  Administered 2018-04-19 – 2018-05-05 (×17): 1 mg via ORAL
  Filled 2018-04-19 (×17): qty 1

## 2018-04-19 NOTE — Patient Care Conference (Signed)
Inpatient RehabilitationTeam Conference and Plan of Care Update Date: 04/18/2018   Time: 10:50 AM    Patient Name: Jessica Meyer      Medical Record Number: [id number]  Date of Birth: 03/22/1935 Sex: Female         Room/Bed: 4W16C/4W16C-01 Payor Info: Payor: MEDICARE / Plan: MEDICARE PART A AND B / Product Type: *No Product type* /    Admitting Diagnosis: CVA  Admit Date/Time:  04/07/2018  1:53 PM Admission Comments: No comment available   Primary Diagnosis:  <principal problem not specified> Principal Problem: <principal problem not specified>  Patient Active Problem List   Diagnosis Date Noted  . Right middle cerebral artery stroke (HCC) 04/07/2018  . Left-sided weakness   . CKD (chronic kidney disease), stage III (HCC)   . Acute blood loss anemia   . Hypothyroidism   . Dyslipidemia   . Diabetes mellitus type 2 in nonobese (HCC)   . Dysphagia, post-stroke   . Dysarthria, post-stroke   . Middle cerebral artery embolism, right 04/05/2018  . Stroke (cerebrum) (HCC) 04/04/2018  . Atypical chest pain 11/10/2017  . CKD stage G3b/A1, GFR 30-44 and albumin creatinine ratio <30 mg/g (HCC) 08/10/2016  . Left hip pain 06/14/2016  . Type 2 diabetes mellitus with stage 3 chronic kidney disease, with long-term current use of insulin (HCC) 01/29/2016  . Leukocytosis 01/25/2016  . Closed fracture of right hip (HCC)   . Fall   . Left knee pain 05/28/2015  . Sprain of hand, third finger, right 05/28/2015  . HTN (hypertension) 07/29/2014  . Gait instability 07/29/2014  . Hyperlipidemia 07/29/2014  . Right knee pain 04/11/2014  . ASNHL (asymmetrical sensorineural hearing loss) 08/07/2013  . 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 Hernia, rectovaginal 08/07/2013  . Hypercholesterolemia without hypertriglyceridemia 08/07/2013  . Arthritis of knee, degenerative 08/07/2013  . Degenerative arthritis of lumbar spine 08/07/2013  . Osteopenia 08/07/2013  . Adult hypothyroidism 08/07/2013  . History of  artificial joint 08/07/2013  . Essential (primary) hypertension 08/07/2013  . Depression with anxiety 08/07/2013    Expected Discharge Date: Expected Discharge Date: 05/05/18  Team Members Present: Physician leading conference: Dr. Claudette LawsAndrew Kirsteins Social Worker Present: Staci AcostaJenny Launa Goedken, LCSW Nurse Present: Gerre CouchKayla Mabe, LPN PT Present: Grier RocherAustin Tucker, PT;Rosita Dechalus, PTA OT Present: Jackquline DenmarkKatie Bradsher, OT SLP Present: Reuel DerbyHappi Overton, SLP PPS Coordinator present : Fae PippinMelissa Bowie     Current Status/Progress Goal Weekly Team Focus  Medical   Right sided neck pain, cervical DDD and spondylosis, poor positioning  reduce neck pain , improve continence  Neck pain management   Bowel/Bladder   incontinent of bowel & bladder  incontinence management  timed toileting   Swallow/Nutrition/ Hydration   dysphagia 1 with honey thick liquids, Max A needed for rate and bolus size  Mod A - downgraded 04/18/18  Max A for rate and bolus size   ADL's   max - total A of +2 slide board transfer, max - total A static sitting balance, L inatteniton, pusher, max - total A for command following  min A overall   L NMR, self care, visual scanning, attention to task, functional transfer   Mobility   maxA bed mobility, maxA to total SB transfer, L inattention, pushing, poor awareness and following commands  MinA w/c level  sitting balance, L NMR, transfer training   Communication             Safety/Cognition/ Behavioral Observations  Max A for basic sustained attention, comprehension, left inattention, following  simple directions  Mod A for basic cognition - downgraded 4/15  sustained attention, following 1 step directions, basic problem solving, left inattention - Max A   Pain   c/o pain rating 10/10 to various areas mostly right head, neack and shoulder pain little relief with PRN meds  No pain or pain rated <5  monitor and treat pain as needed   Skin   Old rt groin incision  no new skin breakdown  assess q  shift    Rehab Goals Patient on target to meet rehab goals: Yes Rehab Goals Revised: none *See Care Plan and progress notes for long and short-term goals.     Barriers to Discharge  Current Status/Progress Possible Resolutions Date Resolved   Physician    Neurogenic Bowel & Bladder;Medical stability     poor progress  Cont rehab      Nursing                  PT                    OT                  SLP                SW                Discharge Planning/Teaching Needs:  Pt's dtr and extended family would like for pt to go to dtr's home at d/c where family paid caregivers will care for pt.  If pt is more care than they can provide, they will consider SNF care.   Family education to occur beginning of the week of April 27th.   Team Discussion:  Pt with large R MCA infarct.  Pt is complaining of neck pain and she keeps her head turned in the same direction and pt has some cervical spondylosis and degenerative disc issues, so this probably doesn't help.  Pt has poor awareness of her deficits and cognitive deficits and her mobility is poor.  Pt calls nursing for the bed pan, but doesn't need to go or goes before they arrive, so nursing will try timed toileting.  Pt is using call bell more.  Pt is more content in OT, but can't follow cueing.  Heat, muscle rub, stretching and repositioning to address neck pain.  Pt is on D1 diet and nectar thick liquids.  She has decreased ability to understand speech strategies.  ST will look at doing another MBS.  PT stated pt has not made much progress this week.  She is max A +2 for slide board and she still pushes and this affects her sitting balance.  Pt has mod A goals and may be able to reach these, but more likely will need max A.   Revisions to Treatment Plan:  none    Continued Need for Acute Rehabilitation Level of Care: The patient requires daily medical management by a physician with specialized training in physical medicine and  rehabilitation for the following conditions: Daily direction of a multidisciplinary physical rehabilitation program to ensure safe treatment while eliciting the highest outcome that is of practical value to the patient.: Yes Daily medical management of patient stability for increased activity during participation in an intensive rehabilitation regime.: Yes Daily analysis of laboratory values and/or radiology reports with any subsequent need for medication adjustment of medical intervention for : Neurological problems   I attest that I was present, lead the team  conference, and concur with the assessment and plan of the team.   Krishna Heuer, [place] 04/19/2018, 11:42 AM

## 2018-04-19 NOTE — Progress Notes (Signed)
PHYSICAL MEDICINE & REHABILITATION PROGRESS NOTE  Subjective/Complaints: Right  Sided neck pain , no radicular sx Severe STM deficits  ROS: Denies CP, shortness of breath, nausea, vomiting, diarrhea.Occ cough with meals  Objective: Vital Signs: Blood pressure 117/68, pulse 77, temperature 98 F (36.7 C), temperature source Oral, resp. rate 18, height 5\' 5"  (1.651 m), weight 66.1 kg, SpO2 99 %. Dg Cervical Spine Complete  Result Date: 04/17/2018 CLINICAL DATA:  83 year old female with right-sided neck pain EXAM: CERVICAL SPINE - COMPLETE 4+ VIEW COMPARISON:  CTA neck 04/04/2018 FINDINGS: No evidence of acute fracture or malalignment. Multilevel degenerative disc disease most severe at C2-C3 and C5-C6. No significant interval change comparing across modalities to the prior CT scan. No prevertebral soft tissue swelling. Carotid artery calcifications are noted incidentally. IMPRESSION: 1. No acute fracture or malalignment. 2. Multilevel degenerative disc disease most severe at C2-C3 and C5-C6. Electronically Signed   By: Malachy MoanHeath  McCullough M.D.   On: 04/17/2018 14:20   Recent Labs    04/16/18 0908  WBC 9.8  HGB 10.8*  HCT 33.2*  PLT 366   No results for input(s): NA, K, CL, CO2, GLUCOSE, BUN, CREATININE, CALCIUM in the last 72 hours.  Physical Exam: BP 117/68 (BP Location: Right Arm)   Pulse 77   Temp 98 F (36.7 C) (Oral)   Resp 18   Ht 5\' 5"  (1.651 m)   Wt 66.1 kg   SpO2 99%   BMI 24.26 kg/m  Constitutional: Well-developed.  Well-nourished.  NAD.  Vital signs reviewed. HENT: Normocephalic.  Atraumatic. Eyes: EOMI.  No discharge. Cardiovascular: No JVD. Respiratory: Effort normal  GI: Nondistended Musculoskeletal: tenderness Right trapezius Neurological: She is somnolent this a.m.  Left-sided neglect   Follows commands.    Poor insight into deficits Dysarthria Motor: LLE: 0/5 proximal to distal 0/5 left upper extremity unchanged RLE: 5/5 proximal to  distal Sensation absent to light touch LLE  Severe left neglect Poor attention and concentration Increased ankle plantar flexor tone MAS 3 Left elbow flexors and finger flexors  Unchanged  Skin: Skin is warm and dry.  Psychiatric: Her affect is blunt. Her speech is delayed.   Assessment/Plan: 1. Functional deficits secondary to right MCA infarct which require 3+ hours per day of interdisciplinary therapy in a comprehensive inpatient rehab setting.  Physiatrist is providing close team supervision and 24 hour management of active medical problems listed below.  Physiatrist and rehab team continue to assess barriers to discharge/monitor patient progress toward functional and medical goals  Care Tool:  Bathing  Bathing activity did not occur: Safety/medical concerns Body parts bathed by patient: Chest, Abdomen, Face(pt did 25% of L arm)   Body parts bathed by helper: Left lower leg, Right lower leg, Left upper leg, Right upper leg, Right arm, Left arm Body parts n/a: Front perineal area, Buttocks(pt already cleansed by nursing)   Bathing assist       Upper Body Dressing/Undressing Upper body dressing   What is the patient wearing?: Pull over shirt    Upper body assist Assist Level: Maximal Assistance - Patient 25 - 49%    Lower Body Dressing/Undressing Lower body dressing    Lower body dressing activity did not occur: Safety/medical concerns What is the patient wearing?: Incontinence brief, Pants     Lower body assist Assist for lower body dressing: 2 Helpers     Toileting Toileting    Toileting assist Assist for toileting: 2 Helpers     Transfers Chair/bed transfer  Transfers assist  Chair/bed transfer activity did not occur: Safety/medical concerns  Chair/bed transfer assist level: Total Assistance - Patient < 25%     Locomotion Ambulation   Ambulation assist   Ambulation activity did not occur: Safety/medical concerns          Walk 10 feet  activity   Assist  Walk 10 feet activity did not occur: Safety/medical concerns        Walk 50 feet activity   Assist Walk 50 feet with 2 turns activity did not occur: Safety/medical concerns         Walk 150 feet activity   Assist Walk 150 feet activity did not occur: Safety/medical concerns         Walk 10 feet on uneven surface  activity   Assist Walk 10 feet on uneven surfaces activity did not occur: Safety/medical concerns         Wheelchair     Assist Will patient use wheelchair at discharge?: Yes Type of Wheelchair: Manual    Wheelchair assist level: Dependent - Patient 0% Max wheelchair distance: 150 ft    Wheelchair 50 feet with 2 turns activity    Assist        Assist Level: Dependent - Patient 0%   Wheelchair 150 feet activity     Assist     Assist Level: Dependent - Patient 0%      Medical Problem List and Plan: 1.  Left side weakness with dysphagia secondary to right MCA infarction in the setting of right ICA and right MCA occlusion status post unsuccessful attempts at thrombectomy. CIR PT, OT, SLP. Resting hand splint for tone .2.  Antithrombotics: -DVT/anticoagulation:  SCDs             -antiplatelet therapy: aspirin 81 mg daily, Plavix 75 mg daily 3. Pain Management:  Tylenol 3 for headache as needed, flank pain has subsided, right-sided neck pain is the current complaint. Xray confirmed cervical spondylosis +/- DDD exacerbated by neck positioning from Left neglect Kpad , sportscreme Robaxin change T#4 to hydrocodone 4. Mood: Wellbutrin 300 mg daily,Lexapro 5 mg daily at bedtime             -antipsychotic agents: N/A 5. Neuropsych: This patient is not fully capable of making decisions on her own behalf. 6. Skin/Wound Care:  Routine skin checks 7. Fluids/Electrolytes/Nutrition:  Routine in and out's with follow-up chemistries tomorrow. 8. Dysphagia. Dysphagia #1 nectar liquids. Follow-up speech therapy              Advance diet as tolerated- repeat MBS today  9. Hypertension. Norvasc 5 mg daily,lisinopril 40 mg daily, Coreg 12.5 mg twice a day.   Monitor with increased mobility 10. Diabetes mellitus. Hemoglobin A1c 8.0. Glucotrol XL 2.5 mg daily, Lantus insulin 12 units daily at bedtime. Check blood sugars before meals and at bedtime.      CBG (last 3)  Recent Labs    04/18/18 1705 04/18/18 2149 04/19/18 0622  GLUCAP 185* 242* 159*   increase Glucotrol XL 11. Hyperlipidemia. Lipitor 12. Hypothyroidism. Continue Synthroid 13.  Acute blood loss anemia:              Hemoglobin 9.3 on 4/4 follow-up tomorrow 14.  CKD stage III:             Creatinine 1.11 on 4/4. 1.22 on 4/6      LOS: 12 days A FACE TO FACE EVALUATION WAS PERFORMED  Erick Colace 04/19/2018, 8:50 AM

## 2018-04-19 NOTE — Progress Notes (Signed)
Orthopedic Tech Progress Note Patient Details:  Jessica Meyer 08/27/1935 [id number] Called in order to [facility] Patient ID: Jessica Meyer, female   DOB: February 23, 1935, 83 y.o.   MRN: [id number]   Donald Pore 04/19/2018, 8:54 AM

## 2018-04-19 NOTE — Plan of Care (Signed)
Max assist with adls 

## 2018-04-19 NOTE — Progress Notes (Signed)
Occupational Therapy Session Note  Patient Details  Name: Jessica Meyer MRN: [id number] Date of Birth: [birth year]  Today's Date: 04/19/2018 OT Individual Time: 1001-1100 and 4332-9518 OT Individual Time Calculation (min): 59 min and 23 min   Short Term Goals: Week 2:  OT Short Term Goal 1 (Week 2): Pt will maintain static sitting balance on EOB with max A for 1 minute OT Short Term Goal 2 (Week 2): Pt will attend to L visual field to locate self care items with max cuing.   Skilled Therapeutic Interventions/Progress Updates:   Session 1: Upon entering the room, pt supine in bed. OT standing at pt's midline and pt unable to scan to locate therapist without therapist stepping into R visual field. Pt verbalized pain in R neck, L LE, and headache. RN notified and medication given this session. Resting hand splint arrived and donned on pt's L hand. Pt verbalized need to have BM and placed onto bedpan but unable to void and only had small smear. Total A +2 for hygiene and LB clothing management. Pt rolling L <> R with max A and max multimodal cuing to sequence. Supine >sit with max A to EOB. Stand pivot transfer to the R with total A. Pt seated in wheelchair and tilted back for safety. K pad and heat applied to neck and L LE for pain management. Pt remained in tilt in space with chair alarm belt donned for safety. Call bell placed in lap and all needs within reach. OT also assisting pt with multiple sips for thin liquids from cup with minimal spillage.   Session 2: Upon entering the room, pt supine in bed and requesting water. OT placing cup in R hand and pt able to drink without spillage this session. OT sitting on pt's L side and pt having to visually scan to midline and L to locate cards from family. Pt needing increased time with max - total multimodal cuing and at time manual facilitation for head turn to locate. Pt repositioned and remained in bed at end of session with k pad on lower back per pt  request. Resting hand splint removed with no skin integrity issues noted and wearing schedule placed above bed for staff. Bed alarm activated and call bell within reach upon exiting the room.   Therapy Documentation Precautions:  Precautions Precautions: Fall Precaution Comments: left inattention and right gaze, increased extensor tone LLE and Flexion tone LUE at shoulder and elbow Restrictions Weight Bearing Restrictions: No ADL: ADL Eating: Maximal assistance Where Assessed-Eating: Bed level Grooming: Moderate assistance Where Assessed-Grooming: Bed level Upper Body Bathing: Maximal assistance Where Assessed-Upper Body Bathing: Bed level Lower Body Bathing: Maximal assistance Where Assessed-Lower Body Bathing: Bed level Upper Body Dressing: Maximal assistance Where Assessed-Upper Body Dressing: Bed level Lower Body Dressing: Dependent Where Assessed-Lower Body Dressing: Bed level Toileting: Dependent Where Assessed-Toileting: Bed level Toilet Transfer: Unable to assess   Therapy/Group: Individual Therapy  Alen Bleacher 04/19/2018, 12:21 PM

## 2018-04-19 NOTE — Progress Notes (Signed)
Physical Therapy Session Note  Patient Details  Name: Jessica Meyer MRN: [id number] Date of Birth: 30-Nov-1935  Today's Date: 04/19/2018 PT Individual Time: 1420-1500 PT Individual Time Calculation (min): 40 min   Short Term Goals: Week 2:  PT Short Term Goal 1 (Week 2): Pt will perform sit<>stand using LRAD with mod A of +2 PT Short Term Goal 2 (Week 2): Pt will perform bed<>chair transfers with Max assist of +1 PT Short Term Goal 3 (Week 2): Pt will maintain static/dynamic sitting balance with min A during functional tasks.  Skilled Therapeutic Interventions/Progress Updates:    Patient received in bed, sleeping but easily woken. Able to complete supine to sit with heavy maxA, continues to demonstrate strong R lean with flexed posture and extensor tone L LE/flexor tone L UE. Able to sit at EOB for approximately 20 minutes with MaxAx2, max cues for attention to tasks including reorientation and reminiscing from patient's personal pictures, and max cues for postural corrections which she was unable to maintain for no more than 3 seconds at a time. Performed several sit to stands with maxAx2, able to maintain standing for approximately 10-15 seconds before fatiguing each time. Returned to bed with maxAx2, performed heel cord stretching in supine and repositioned in bed with maxax2. She was left in bed with all needs met, bed alarm active this afternoon.   Therapy Documentation Precautions:  Precautions Precautions: Fall Precaution Comments: left inattention and right gaze, increased extensor tone LLE and Flexion tone LUE at shoulder and elbow Restrictions Weight Bearing Restrictions: No General:   Pain: Pain Assessment Pain Scale: Faces Pain Score: 0-No pain Faces Pain Scale: No hurt    Therapy/Group: Individual Therapy   Deniece Ree PT, DPT, CBIS  Supplemental Physical Therapist [facility]    Pager [phone] Acute Rehab Office [phone]    04/19/2018, 3:50 PM

## 2018-04-19 NOTE — Progress Notes (Signed)
Speech Language Pathology Daily Session Note  Patient Details  Name: Minha Awwad MRN: [id number] Date of Birth: 10-18-1935  Today's Date: 04/19/2018   Skilled treatment session #1 SLP Individual Time: 0945-1000 SLP Individual Time Calculation (min): 15 min   Skilled treatment session #2 SLP Individual Time: 1300-1345 SLP Individual Time Calculation (min): 45 min  Short Term Goals: Week 2: SLP Short Term Goal 1 (Week 2): Pt will demonstrate sustained attention to task for ~ 4 minutes with Min A cues.  SLP Short Term Goal 2 (Week 2): Pt will scan to left of midline to locate items in 8 out of 10 opportunities with Mod A cues.  SLP Short Term Goal 3 (Week 2): Pt will complete basic familiar problem solving tasks with Min A cues.  SLP Short Term Goal 4 (Week 2): Pt will consume current diet with minimal s/s of aspiration and Mod A cues for use of compensatory swallow strategies.   Skilled Therapeutic Interventions:  Skilled treatment session focused on education with pt and her daughter regarding MBS and recommendation for thin liquids and possibility of diet upgrade. All questions answered to satisfaction.   Skilled treatment session focused on dysphagia and cognition goals. SLP facilitiated session by providing Max A multimodal cues/assistance to sit upright in bed d/t pushing and cognitive deficits. SLP further facilitated session by placing items such as ice cream to patient's left at end of meal to facilitate midline and left attention. Pt required Max A cues to cease talking while consuming dysphagia 2 lunch tray with thin liquids. Pt with cough x 2 d/t attempt to talk while consuming. Pt continues to left anterior spillage and Mod A cues to clear left buccal cavity of textures. All information updated at bedside and nursing informed of updated diet.      Pain Pain Assessment Pain Scale: 0-10 Pain Score: 0-No pain  Therapy/Group: Individual Therapy  Abriel Hattery 04/19/2018,  12:41 PM

## 2018-04-19 NOTE — Progress Notes (Signed)
Modified Barium Swallow Progress Note  Patient Details  Name: Jessica Meyer MRN: [id number] Date of Birth: Dec 30, 1935  Today's Date: 04/19/2018  Modified Barium Swallow completed.  Full report located under Chart Review in the Imaging Section.  Brief recommendations include the following:  Clinical Impression  Pt with improved oropharyngeal abilities as evidenced by swallow initiaiton at the pyriform sinuses when consuming thin liquids. Pt has improved glottal closure and protects airway when consuming thin liquids. Pt with one instance of sensed aspiration when consuming large consecutive sips. Pt challenged with impaired positioning in radiology chair and multiple trials across thin liquids, mechanical soft and pill whole in applesauce. Pt is appropriate for upgrade to dysphagia 2, thin liquids and medicine whole in puree. Continue full nursing supervision d/t congitive and vision impairments.    Swallow Evaluation Recommendations       SLP Diet Recommendations: Dysphagia 2 (Fine chop) solids;Thin liquid   Liquid Administration via: Cup   Medication Administration: Whole meds with puree   Supervision: Staff to assist with self feeding;Full supervision/cueing for compensatory strategies   Compensations: Slow rate;Minimize environmental distractions;Small sips/bites;Lingual sweep for clearance of pocketing   Postural Changes: Seated upright at 90 degrees;Remain semi-upright after after feeds/meals (Comment)   Oral Care Recommendations: Oral care BID        Jessica Meyer 04/19/2018,9:32 AM

## 2018-04-20 ENCOUNTER — Inpatient Hospital Stay (HOSPITAL_COMMUNITY): Payer: Medicare Other | Admitting: Speech Pathology

## 2018-04-20 ENCOUNTER — Inpatient Hospital Stay (HOSPITAL_COMMUNITY): Payer: Medicare Other | Admitting: Physical Therapy

## 2018-04-20 ENCOUNTER — Inpatient Hospital Stay (HOSPITAL_COMMUNITY): Payer: Medicare Other | Admitting: Occupational Therapy

## 2018-04-20 LAB — GLUCOSE, CAPILLARY
Glucose-Capillary: 166 mg/dL — ABNORMAL HIGH (ref 70–99)
Glucose-Capillary: 145 mg/dL — ABNORMAL HIGH (ref 70–99)
Glucose-Capillary: 111 mg/dL — ABNORMAL HIGH (ref 70–99)
Glucose-Capillary: 185 mg/dL — ABNORMAL HIGH (ref 70–99)

## 2018-04-20 NOTE — Progress Notes (Signed)
Physical Therapy Session Note  Patient Details  Name: Jessica Meyer MRN: [id number] Date of Birth: 10-29-1935  Today's Date: 04/20/2018 PT Individual Time: 1150-1230 PT Individual Time Calculation (min): 40 min   Short Term Goals: Week 2:  PT Short Term Goal 1 (Week 2): Pt will perform sit<>stand using LRAD with mod A of +2 PT Short Term Goal 2 (Week 2): Pt will perform bed<>chair transfers with Max assist of +1 PT Short Term Goal 3 (Week 2): Pt will maintain static/dynamic sitting balance with min A during functional tasks.  Skilled Therapeutic Interventions/Progress Updates:    Patient received up in [facility]; transported her to [place] for time management and attempted multiple sit to stands but limited by strong L lean and flexed posture as well as patient not putting weight down through her left leg. Returned her to sitting position in [facility] and went to standing frame, dependent for positioning lift sling under ischial tuberosities however able to tolerate standing for approximately 8-10 minutes with Max cues for upright posture and R lean, much improved weight bearing through L LE while in standing frame. Returned to her room totalA in [facility], and performed totalAx2 pivot transfer back to bed, MaxAx2 for sit to supine and repositioning in bed. She was left in bed with alarm active in care of speech therapist, all needs otherwise met today.   Therapy Documentation Precautions:  Precautions Precautions: Fall Precaution Comments: left inattention and right gaze, increased extensor tone LLE and Flexion tone LUE at shoulder and elbow Restrictions Weight Bearing Restrictions: No   Pain: Pain Assessment Pain Scale: Faces Pain Score: 0 Faces Pain Scale: No hurt     Therapy/Group: Individual Therapy   Deniece Ree PT, DPT, CBIS  Supplemental Physical Therapist [facility]    Pager [phone] Acute Rehab Office [phone]    04/20/2018, 1:23 PM

## 2018-04-20 NOTE — Progress Notes (Signed)
[place] PHYSICAL MEDICINE & REHABILITATION PROGRESS NOTE  Subjective/Complaints:    ROS: Denies CP, shortness of breath, nausea, vomiting, diarrhea.Occ cough with meals  Objective: Vital Signs: Blood pressure 133/63, pulse 67, temperature (!) 97.5 F (36.4 C), temperature source Oral, resp. rate 20, height  (1.651 m), weight 66.1 kg, SpO2 96 %. Dg Swallowing Func-speech Pathology  Result Date: 04/19/2018 Objective Swallowing Evaluation: Type of Study: MBS-Modified Barium Swallow Study  Patient Details Name: Jessica Meyer MRN: [id number] Date of Birth: 1935-03-14 Today's Date: 04/19/2018 Time: SLP Start Time (ACUTE ONLY): 1057 -SLP Stop Time (ACUTE ONLY): 1121 SLP Time Calculation (min) (ACUTE ONLY): 24 min Past Medical History: Past Medical History: Diagnosis Date . Arthritis  . Diabetes mellitus without complication (HCC)  . History of chicken pox  . Hyperlipidemia  . Hypertension  . Thyroid disease   hypothyroid Past Surgical History: Past Surgical History: Procedure Laterality Date . ABDOMINAL HYSTERECTOMY   . HIP ARTHROPLASTY Right 01/26/2016  Procedure: ARTHROPLASTY BIPOLAR HIP (HEMIARTHROPLASTY);  Surgeon: Sheral Apley, MD;  Location: [facility] OR;  Service: Orthopedics;  Laterality: Right; . IR ANGIO EXTRACRAN SEL COM CAROTID INNOMINATE UNI L MOD SED  04/05/2018 . IR CT HEAD LTD  04/05/2018 . IR PERCUTANEOUS ART THROMBECTOMY/INFUSION INTRACRANIAL INC DIAG ANGIO  04/05/2018 . KNEE ARTHROSCOPY Bilateral  . RADIOLOGY WITH ANESTHESIA N/A 04/04/2018  Procedure: RADIOLOGY WITH ANESTHESIA;  Surgeon: Radiologist, Medication, MD;  Location: [facility] OR;  Service: Radiology;  Laterality: N/A; . REPLACEMENT TOTAL KNEE BILATERAL    1998 left , 1992 right HPI: 83 y.o. female with a history of hypertension, hyperlipidemia, diabetes who presented with left-sided weakness and neglect. Pt with right MCA occlusion.   Bilateral common carotid arteriograms followed unsuccessful attempt at revascularization of Rt MCA  occlusion due to prox Rt ICA occlusion despite multiple angioplasties. intubated for procedure only. Failed RN stroke swallow screen.  Subjective: alert but groggy Assessment / Plan / Recommendation CHL IP CLINICAL IMPRESSIONS 04/19/2018 Clinical Impression Pt with improved oropharyngeal abilities as evidenced by swallow initiaiton at the pyriform sinuses when consuming thin liquids. Pt has improved glottal closure and protects airway when consuming thin liquids. Pt with one instance of sensed aspiration when consuming large consecutive sips. Pt challenged with impaired positioning in radiology chair and multiple trials across thin liquids, mechanical soft and pill whole in applesauce. Pt is appropriate for upgrade to dysphagia 2, thin liquids and medicine whole in puree. Continue full nursing supervision d/t congitive and vision impairments.  SLP Visit Diagnosis Dysphagia, oropharyngeal phase (R13.12);Cognitive communication deficit (R41.841) Attention and concentration deficit following -- Frontal lobe and executive function deficit following -- Impact on safety and function Mild aspiration risk   CHL IP TREATMENT RECOMMENDATION 04/09/2018 Treatment Recommendations Therapy as outlined in treatment plan below   No flowsheet data found. CHL IP DIET RECOMMENDATION 04/19/2018 SLP Diet Recommendations Dysphagia 2 (Fine chop) solids;Thin liquid Liquid Administration via Cup Medication Administration Whole meds with puree Compensations Slow rate;Minimize environmental distractions;Small sips/bites;Lingual sweep for clearance of pocketing Postural Changes Seated upright at 90 degrees;Remain semi-upright after after feeds/meals (Comment)   CHL IP OTHER RECOMMENDATIONS 04/19/2018 Recommended Consults -- Oral Care Recommendations Oral care BID Other Recommendations --   CHL IP FOLLOW UP RECOMMENDATIONS 04/19/2018 Follow up Recommendations Inpatient Rehab   CHL IP FREQUENCY AND DURATION 04/05/2018 Speech Therapy Frequency (ACUTE  ONLY) min 2x/week Treatment Duration --      CHL IP ORAL PHASE 04/19/2018 Oral Phase -- Oral - Pudding Teaspoon -- Oral - Pudding  Cup -- Oral - Honey Teaspoon -- Oral - Honey Cup -- Oral - Nectar Teaspoon -- Oral - Nectar Cup -- Oral - Nectar Straw -- Oral - Thin Teaspoon -- Oral - Thin Cup -- Oral - Thin Straw -- Oral - Puree -- Oral - Mech Soft Weak lingual manipulation;Delayed oral transit Oral - Regular -- Oral - Multi-Consistency -- Oral - Pill -- Oral Phase - Comment --  CHL IP PHARYNGEAL PHASE 04/19/2018 Pharyngeal Phase Impaired Pharyngeal- Pudding Teaspoon -- Pharyngeal -- Pharyngeal- Pudding Cup -- Pharyngeal -- Pharyngeal- Honey Teaspoon NT Pharyngeal -- Pharyngeal- Honey Cup NT Pharyngeal -- Pharyngeal- Nectar Teaspoon NT Pharyngeal -- Pharyngeal- Nectar Cup -- Pharyngeal -- Pharyngeal- Nectar Straw -- Pharyngeal -- Pharyngeal- Thin Teaspoon Delayed swallow initiation-pyriform sinuses Pharyngeal -- Pharyngeal- Thin Cup Delayed swallow initiation-pyriform sinuses Pharyngeal -- Pharyngeal- Thin Straw -- Pharyngeal -- Pharyngeal- Puree -- Pharyngeal -- Pharyngeal- Mechanical Soft WFL Pharyngeal -- Pharyngeal- Regular -- Pharyngeal -- Pharyngeal- Multi-consistency -- Pharyngeal -- Pharyngeal- Pill WFL Pharyngeal -- Pharyngeal Comment --  CHL IP CERVICAL ESOPHAGEAL PHASE 04/19/2018 Cervical Esophageal Phase WFL Pudding Teaspoon -- Pudding Cup -- Honey Teaspoon -- Honey Cup -- Nectar Teaspoon -- Nectar Cup -- Nectar Straw -- Thin Teaspoon -- Thin Cup -- Thin Straw -- Puree -- Mechanical Soft -- Regular -- Multi-consistency -- Pill -- Cervical Esophageal Comment -- Jessica Meyer 04/19/2018, 9:32 AM              No results for input(s): WBC, HGB, HCT, PLT in the last 72 hours. No results for input(s): NA, K, CL, CO2, GLUCOSE, BUN, CREATININE, CALCIUM in the last 72 hours.  Physical Exam: BP 133/63 (BP Location: Right Arm)   Pulse 67   Temp (!) 97.5 F (36.4 C) (Oral)   Resp 20   Ht 5\' 5"  (1.651 m)   Wt  66.1 kg   SpO2 96%   BMI 24.26 kg/m  Constitutional: Well-developed.  Well-nourished.  NAD.  Vital signs reviewed. HENT: Normocephalic.  Atraumatic. Eyes: EOMI.  No discharge. Cardiovascular: No JVD. Respiratory: Effort normal  GI: Nondistended Musculoskeletal: tenderness Right trapezius Neurological: She is somnolent this a.m.  Left-sided neglect   Follows commands.    Poor insight into deficits Dysarthria Motor: LLE: 0/5 proximal to distal 0/5 left upper extremity unchanged RLE: 5/5 proximal to distal Sensation absent to light touch LLE  Severe left neglect Poor attention and concentration Increased ankle plantar flexor tone MAS 3 Left elbow flexors and finger flexors  Unchanged  Skin: Skin is warm and dry.  Psychiatric: Her affect is blunt. Her speech is delayed.   Assessment/Plan: 1. Functional deficits secondary to right MCA infarct which require 3+ hours per day of interdisciplinary therapy in a comprehensive inpatient rehab setting.  Physiatrist is providing close team supervision and 24 hour management of active medical problems listed below.  Physiatrist and rehab team continue to assess barriers to discharge/monitor patient progress toward functional and medical goals  Care Tool:  Bathing  Bathing activity did not occur: Safety/medical concerns Body parts bathed by patient: Chest, Abdomen, Face(pt did 25% of L arm)   Body parts bathed by helper: Left lower leg, Right lower leg, Left upper leg, Right upper leg, Right arm, Left arm Body parts n/a: Front perineal area, Buttocks(pt already cleansed by nursing)   Bathing assist       Upper Body Dressing/Undressing Upper body dressing   What is the patient wearing?: Pull over shirt    Upper body assist Assist Level: Maximal  Assistance - Patient 25 - 49%    Lower Body Dressing/Undressing Lower body dressing    Lower body dressing activity did not occur: Safety/medical concerns What is the patient wearing?:  Incontinence brief, Pants     Lower body assist Assist for lower body dressing: 2 Helpers     Toileting Toileting    Toileting assist Assist for toileting: 2 Helpers     Transfers Chair/bed transfer  Transfers assist  Chair/bed transfer activity did not occur: Safety/medical concerns  Chair/bed transfer assist level: Total Assistance - Patient < 25%     Locomotion Ambulation   Ambulation assist   Ambulation activity did not occur: Safety/medical concerns          Walk 10 feet activity   Assist  Walk 10 feet activity did not occur: Safety/medical concerns        Walk 50 feet activity   Assist Walk 50 feet with 2 turns activity did not occur: Safety/medical concerns         Walk 150 feet activity   Assist Walk 150 feet activity did not occur: Safety/medical concerns         Walk 10 feet on uneven surface  activity   Assist Walk 10 feet on uneven surfaces activity did not occur: Safety/medical concerns         Wheelchair     Assist Will patient use wheelchair at discharge?: Yes Type of Wheelchair: Manual    Wheelchair assist level: Dependent - Patient 0% Max wheelchair distance: 150 ft    Wheelchair 50 feet with 2 turns activity    Assist        Assist Level: Dependent - Patient 0%   Wheelchair 150 feet activity     Assist     Assist Level: Dependent - Patient 0%      Medical Problem List and Plan: 1.  Left side weakness with dysphagia secondary to right MCA infarction in the setting of right ICA and right MCA occlusion status post unsuccessful attempts at thrombectomy. CIR PT, OT, SLP. Resting hand splint for tone, some hamstrings tone developing as well. Hesitant to add zanaflex given sedating side effects    .2.  Antithrombotics: -DVT/anticoagulation:  SCDs             -antiplatelet therapy: aspirin 81 mg daily, Plavix 75 mg daily 3. Pain Management:  Tylenol 3 for headache as needed, flank pain has  subsided, right-sided neck pain is the current complaint. Xray confirmed cervical spondylosis +/- DDD exacerbated by neck positioning from Left neglect Kpad , sportscreme Robaxin change T#4 to hydrocodone- no c/os this am 4. Mood: Wellbutrin 300 mg daily,Lexapro 5 mg daily at bedtime             -antipsychotic agents: N/A 5. Neuropsych: This patient is not fully capable of making decisions on her own behalf. 6. Skin/Wound Care:  Routine skin checks 7. Fluids/Electrolytes/Nutrition:  Routine in and out's with follow-up chemistries tomorrow. 8. Dysphagia. Dysphagia #1 nectar liquids. Follow-up speech therapy             Advance diet as tolerated- repeat MBS today  9. Hypertension. Norvasc 5 mg daily,lisinopril 40 mg daily, Coreg 12.5 mg twice a day.   Monitor with increased mobility 10. Diabetes mellitus. Hemoglobin A1c 8.0. Glucotrol XL 2.5 mg daily, Lantus insulin 12 units daily at bedtime. Check blood sugars before meals and at bedtime.      CBG (last 3)  Recent Labs    04/19/18 1809  04/19/18 2106 04/20/18 0631  GLUCAP 121* 127* 166*  now on amaryl  qam 11. Hyperlipidemia. Lipitor 12. Hypothyroidism. Continue Synthroid 13.  Acute blood loss anemia:              Hemoglobin 9.3 on 4/4 follow-up 10.8 on 4/13 14.  CKD stage III:             Creatinine 1.11 on 4/4. 1.22 on 4/6      LOS: 13 days A FACE TO FACE EVALUATION WAS PERFORMED  Erick Colace 04/20/2018, 8:45 AM

## 2018-04-20 NOTE — Progress Notes (Signed)
Occupational Therapy Session Note  Patient Details  Name: Jessica Meyer MRN: [id number] Date of Birth: 11-21-1935  Today's Date: 04/20/2018 OT Individual Time: 7209-4709 OT Individual Time Calculation (min): 44 min   Short Term Goals: Week 2:  OT Short Term Goal 1 (Week 2): Pt will maintain static sitting balance on EOB with max A for 1 minute OT Short Term Goal 2 (Week 2): Pt will attend to L visual field to locate self care items with max cuing.   Skilled Therapeutic Interventions/Progress Updates:     Pt greeted in bed with c/o HA pain. RN notified and in during session to provide pain medication. Tx focus placed on midline orientation, Lt attention, praxis, balance, and attention during dressing and self feeding tasks. Pt scanning to Lt side with cuing to select shirt and socks for the day. 2 helpers for safe supine<sit with pt exhibiting significant pushing tendencies and posterior bias EOB. Total A for anterior weight shift to prevent sliding off of bed. 2 assist for stand pivot<w/c and for repositioning hips in w/c. Due to [facility] and cognition, pt unable to follow 1 step instruction without max multimodal cuing. While seated, pt was given honey thickened water via teaspoon. OT handed pt spoonful of water on her Lt side. Pt with dysmetria R UE during functional reaching. Cues required for safe swallowing strategies with pt having 1 bout of coughing. While sitting at sink, worked on hemi dressing techniques, with pt requiring max cuing to lift Lt limb into her shirt and bring shirt sleeve up past elbow. Assist also required for managing Lt limb at this time due to hypertonicity. Step by step cuing and assist required for donning shirt completely as well. Able to lift R LE for threading pants with cuing, but otherwise she required Total A, and Total A for standing balance due to Lt pushing and inability to follow safety instruction for improved midline orientation. 2nd helper elevated her pants over  hips at this time. At end of session, 2 assist for safe repositioning in w/c to prevent Lt push out of chair. She was reclined for safety, safety belt fastened, and call bell in lap.    Therapy Documentation Precautions:  Precautions Precautions: Fall Precaution Comments: left inattention and right gaze, increased extensor tone LLE and Flexion tone LUE at shoulder and elbow Restrictions Weight Bearing Restrictions: No Pain: Pain Assessment Pain Scale: Faces Pain Score: 3  Faces Pain Scale: Hurts even more Pain Type: Acute pain Pain Location: Head Pain Orientation: Right;Posterior;Lower Pain Radiating Towards: shoulder, neck Pain Descriptors / Indicators: Headache Pain Frequency: Constant Pain Onset: On-going Patients Stated Pain Goal: 0 Pain Intervention(s): Medication (See eMAR) ADL: ADL Eating: Maximal assistance Where Assessed-Eating: Bed level Grooming: Moderate assistance Where Assessed-Grooming: Bed level Upper Body Bathing: Maximal assistance Where Assessed-Upper Body Bathing: Bed level Lower Body Bathing: Maximal assistance Where Assessed-Lower Body Bathing: Bed level Upper Body Dressing: Maximal assistance Where Assessed-Upper Body Dressing: Bed level Lower Body Dressing: Dependent Where Assessed-Lower Body Dressing: Bed level Toileting: Dependent Where Assessed-Toileting: Bed level Toilet Transfer: Unable to assess     Therapy/Group: Individual Therapy  Jessica Meyer A Jessica Meyer 04/20/2018, 12:11 PM

## 2018-04-20 NOTE — Progress Notes (Signed)
Physical Therapy Session Note  Patient Details  Name: Jessica Meyer MRN: [id number] Date of Birth: 05/23/1935  Today's Date: 04/20/2018 PT Individual Time: 5597-4163 PT Individual Time Calculation (min): 28 min   Short Term Goals: Week 2:  PT Short Term Goal 1 (Week 2): Pt will perform sit<>stand using LRAD with mod A of +2 PT Short Term Goal 2 (Week 2): Pt will perform bed<>chair transfers with Max assist of +1 PT Short Term Goal 3 (Week 2): Pt will maintain static/dynamic sitting balance with min A during functional tasks.  Skilled Therapeutic Interventions/Progress Updates:    Patient received in bed, calm and cooperative with PT session today. Worked on functional stretching of L heel cord in bed, then continued to require MaxAx1-2 for supine to sit, demonstrate strong L lateral lean and flexed posture with occasional extensor tone in sitting. She participated in recollection activities at EOB but did respond well to verbal cues and required only MinAx1 and Min guard of second person for safety this afternoon to sit at EOB for 17 minutes! Required totalAx2 for sit to supine and for repositioning/sliding up in bed. She remained confused today, intermittently shouting out statements such as "I'm pregnant!" out of context. She was left in bed with all needs met, bed alarm active this afternoon.   Therapy Documentation Precautions:  Precautions Precautions: Fall Precaution Comments: left inattention and right gaze, increased extensor tone LLE and Flexion tone LUE at shoulder and elbow Restrictions Weight Bearing Restrictions: No General:   Vital Signs:  Pain: Pain Assessment Pain Scale: Faces Faces Pain Scale: No hurt    Therapy/Group: Individual Therapy   Deniece Ree PT, DPT, CBIS  Supplemental Physical Therapist [facility]    Pager [phone] Acute Rehab Office [phone]    04/20/2018, 3:57 PM

## 2018-04-20 NOTE — Progress Notes (Signed)
Speech Language Pathology Weekly Progress and Session Note  Patient Details  Name: Jessica Meyer MRN: [id number] Date of Birth: 1935-10-14  Beginning of progress report period: April 16, 2018 End of progress report period: April 20, 2018  Today's Date: 04/20/2018   Skilled treatment session #1 SLP Individual Time: 0825-0910 SLP Individual Time Calculation (min): 45 min   Skilled treatment session #2 SLP Individual Time: 1230-1300 SLP Individual Time Calculation (min): 30 min   Short Term Goals: Week 2: SLP Short Term Goal 1 (Week 2): Pt will demonstrate sustained attention to task for ~ 4 minutes with Min A cues.  SLP Short Term Goal 1 - Progress (Week 2): Not met SLP Short Term Goal 2 (Week 2): Pt will scan to left of midline to locate items in 8 out of 10 opportunities with Mod A cues.  SLP Short Term Goal 2 - Progress (Week 2): Not met SLP Short Term Goal 3 (Week 2): Pt will complete basic familiar problem solving tasks with Min A cues.  SLP Short Term Goal 3 - Progress (Week 2): Not met SLP Short Term Goal 4 (Week 2): Pt will consume current diet with minimal s/s of aspiration and Mod A cues for use of compensatory swallow strategies.  SLP Short Term Goal 4 - Progress (Week 2): Not met    New Short Term Goals: Week 3: SLP Short Term Goal 1 (Week 3): Pt will consume trials of thin liquids via cup sips with minimal overt s/s of aspiration over 3 sessions to demonstrate readiness for diet upgrade.  SLP Short Term Goal 2 (Week 3): Pt will consume trials of dysphagia 2 with minimal overt s/s of aspiration/dysphagia over 3 sessions to demonstrate readiness for diet upgrade.  SLP Short Term Goal 3 (Week 3): Pt will follow 1 step simple directions in 5 out of 10 opportunities with Mod A cues over 3 sessions.  SLP Short Term Goal 4 (Week 3): Pt will answer basic yes/no questions related to self/orientation/current situation in 5 out of 10 opportunities with Mod A cues over 3 sessions.   SLP Short Term Goal 5 (Week 3): Pt will complete basic familiar problem solving tasks related to ADLs (washing her face, brushing teeth, brushing hair) with Mod A cues.  SLP Short Term Goal 6 (Week 3): Pt will sustain attention to task for ~ 5 minutes with Mod A cues for redirection to task.   Weekly Progress Updates:     Intensity: Minumum of 1-2 x/day, 30 to 90 minutes Frequency: 3 to 5 out of 7 days Duration/Length of Stay: 5/2 Treatment/Interventions: Cognitive remediation/compensation;Cueing hierarchy;Internal/external aids;Therapeutic Activities;Patient/family education;Functional tasks;Dysphagia/aspiration precaution training;Environmental controls   Daily Session  Skilled Therapeutic Interventions:  Skilled treatment session #1 focused on cognition and dydsphagia goals. SLP received pt in bed with honey thick liquids present in room. Further investigation revealed that pt demonstrated severe coughing with currently prescribed diet (dysphagia 2 with thin liquids) on previous evening. SLP facilitiated session by providing skilled observation of pt consuming dysphagia 2 breakfast tray with thin liquids. Pt continues to present with severe cognitive deficits that result in overt s/s of aspiration. Pt requires repetitive Max A to follow 1 step directions, no inhabition with talking (she continually asks "what is this?" and points to items on her tray) and as a result she frequently coughed during consumption of meal. On both MBS studies, pt only cough during moments of aspiration. Multiple strategies attempted to compensate for cognitive deficits. For example, all items were  removed from tray except for drink and plate and SLP sat to pt's left to decrease desire for pt to interact with SLP. Despite these strategies, pt was not aware that no one was within her preferred line of sight and continued asking questions despite not receiving answer. Suspect that pt is not able to orally contain minced  particles and consume honey thick liquids. Pt's coughing is indicative of decrease airway protection and places her at risk of pneumonia when consuming currently prescribed diet. SLP supplemented honey thick liquids with dysphagia 2 but pt continued coughing.  Will return pt to dysphagia 1 with honey thick liquids and will assess pt with puree and nectar at next session.   Skilled treatment session #2 focused on dysphagia and cognition goals. SLP facilitated session by providing Total A for bed mobility and focused attention to tasks such as not pulling on her covers. Specifically, SLP provided skilled observation of pt consuming dysphagia 1 lunch tray with nectar thick liquids supplemented to assess ability to upgrade to nectar. On first MBS pt with sensed aspiration of nectar thick liquids. During this session pt was not able to maintain neutral body mechanics and was leaning to her left (new problem as she usually doesn't favor this side). Pt with continued deficits in previously documented areas and coughing when consuming cup sips of nectar thick liquids. Continue current diet of dysphagia 1 with honey thick liquids and would recommend intense focus on cognition. STGs changed to reflect more appropriate STGs for cognition. As mentation improves, further trials can be attempted.   General    Pain Pain Assessment Pain Scale: Faces Pain Score: 2  Faces Pain Scale: No hurt(although she frequently calls out "in pain" )  Therapy/Group: Individual Therapy  Da Authement 04/20/2018, 2:35 PM

## 2018-04-20 NOTE — Progress Notes (Signed)
Social Work Patient ID: Jessica Meyer, female   DOB: [birth year], 83 y.o.   MRN: [id number]   CSW met with pt and talked with her family (dtr, son, son-in-law) via telephone to update them on team conference discussion.  Explained that pt had mod A goals, but may need more like max A at d/c.  CSW answered their questions and the group decided to see how pt progressed over this next week and then have dtr come in around April 27th for family education to see if they felt she could be cared for at home.  CSW will continue to follow and assist as needed.

## 2018-04-21 ENCOUNTER — Inpatient Hospital Stay (HOSPITAL_COMMUNITY): Payer: Medicare Other | Admitting: Speech Pathology

## 2018-04-21 ENCOUNTER — Inpatient Hospital Stay (HOSPITAL_COMMUNITY): Payer: Medicare Other | Admitting: Occupational Therapy

## 2018-04-21 LAB — GLUCOSE, CAPILLARY
Glucose-Capillary: 134 mg/dL — ABNORMAL HIGH (ref 70–99)
Glucose-Capillary: 142 mg/dL — ABNORMAL HIGH (ref 70–99)
Glucose-Capillary: 112 mg/dL — ABNORMAL HIGH (ref 70–99)
Glucose-Capillary: 229 mg/dL — ABNORMAL HIGH (ref 70–99)

## 2018-04-21 NOTE — Progress Notes (Signed)
[facility] PHYSICAL MEDICINE & REHABILITATION PROGRESS NOTE  Subjective/Complaints:  No new issues.   ROS: limited due to language/communication   Objective: Vital Signs: Blood pressure 109/76, pulse 67, temperature 97.8 F (36.6 C), temperature source Oral, resp. rate 16, height 5\' 5"  (1.651 m), weight 66.1 kg, SpO2 95 %. No results found. No results for input(s): WBC, HGB, HCT, PLT in the last 72 hours. No results for input(s): NA, K, CL, CO2, GLUCOSE, BUN, CREATININE, CALCIUM in the last 72 hours.  Physical Exam: BP 109/76 (BP Location: Right Arm)   Pulse 67   Temp 97.8 F (36.6 C) (Oral)   Resp 16   Ht 5\' 5"  (1.651 m)   Wt 66.1 kg   SpO2 95%   BMI 24.26 kg/m  Constitutional: No distress . Vital signs reviewed. HEENT: EOMI, oral membranes moist Neck: supple Cardiovascular: RRR without murmur. No JVD    Respiratory: CTA Bilaterally without wheezes or rales. Normal effort    GI: BS +, non-tender, non-distended  Musculoskeletal: tenderness Right trapezius Neurological: She is somnolent this a.m.  Left-sided neglect   Follows commands.    Poor insight into deficits Dysarthria Motor: LLE: 0/5 proximal to distal 0/5 left upper extremity unchanged RLE: 5/5 proximal to distal Sensation absent to light touch LLE  Poor attention and concentration Increased ankle plantar flexor tone MAS 3 Left elbow flexors and finger flexors  -appears stable  Skin: Skin is warm and dry.  Psychiatric: flat   Assessment/Plan: 1. Functional deficits secondary to right MCA infarct which require 3+ hours per day of interdisciplinary therapy in a comprehensive inpatient rehab setting.  Physiatrist is providing close team supervision and 24 hour management of active medical problems listed below.  Physiatrist and rehab team continue to assess barriers to discharge/monitor patient progress toward functional and medical goals  Care Tool:  Bathing  Bathing activity did not occur:  Safety/medical concerns Body parts bathed by patient: Chest, Abdomen, Face(pt did 25% of L arm)   Body parts bathed by helper: Left lower leg, Right lower leg, Left upper leg, Right upper leg, Right arm, Left arm Body parts n/a: Front perineal area, Buttocks(pt already cleansed by nursing)   Bathing assist       Upper Body Dressing/Undressing Upper body dressing   What is the patient wearing?: Pull over shirt    Upper body assist Assist Level: Maximal Assistance - Patient 25 - 49%    Lower Body Dressing/Undressing Lower body dressing    Lower body dressing activity did not occur: Safety/medical concerns What is the patient wearing?: Pants     Lower body assist Assist for lower body dressing: 2 Helpers     Toileting Toileting    Toileting assist Assist for toileting: 2 Helpers     Transfers Chair/bed transfer  Transfers assist  Chair/bed transfer activity did not occur: Safety/medical concerns  Chair/bed transfer assist level: Total Assistance - Patient < 25%     Locomotion Ambulation   Ambulation assist   Ambulation activity did not occur: Safety/medical concerns          Walk 10 feet activity   Assist  Walk 10 feet activity did not occur: Safety/medical concerns        Walk 50 feet activity   Assist Walk 50 feet with 2 turns activity did not occur: Safety/medical concerns         Walk 150 feet activity   Assist Walk 150 feet activity did not occur: Safety/medical concerns  Walk 10 feet on uneven surface  activity   Assist Walk 10 feet on uneven surfaces activity did not occur: Safety/medical concerns         Wheelchair     Assist Will patient use wheelchair at discharge?: Yes Type of Wheelchair: Manual    Wheelchair assist level: Dependent - Patient 0% Max wheelchair distance: 150 ft    Wheelchair 50 feet with 2 turns activity    Assist        Assist Level: Dependent - Patient 0%   Wheelchair 150  feet activity     Assist     Assist Level: Dependent - Patient 0%      Medical Problem List and Plan: 1.  Left side weakness with dysphagia secondary to right MCA infarction in the setting of right ICA and right MCA occlusion status post unsuccessful attempts at thrombectomy. CIR PT, OT, SLP. Resting hand splint for tone? Don't see in room  some hamstrings tone developing as well. Hesitant to add zanaflex given sedating side effects    . 2.  Antithrombotics: -DVT/anticoagulation:  SCDs             -antiplatelet therapy: aspirin 81 mg daily, Plavix 75 mg daily 3. Pain Management:  Tylenol 3 for headache as needed, flank pain has subsided, right-sided neck pain is the current complaint. Xray confirmed cervical spondylosis +/- DDD exacerbated by neck positioning from Left neglect Kpad , sportscreme Robaxin change T#4 to hydrocodone- no c/os at present 4. Mood: Wellbutrin 300 mg daily,Lexapro 5 mg daily at bedtime             -antipsychotic agents: N/A 5. Neuropsych: This patient is not fully capable of making decisions on her own behalf. 6. Skin/Wound Care:  Routine skin checks 7. Fluids/Electrolytes/Nutrition:  encourage PO 8. Dysphagia. Dysphagia #1 honey liquids.  Follow-up speech therapy             Advance diet as tolerated- per SLP 9. Hypertension. Norvasc 5 mg daily,lisinopril 40 mg daily, Coreg 12.5 mg twice a day.   Monitor with increased mobility 10. Diabetes mellitus. Hemoglobin A1c 8.0. Glucotrol XL 2.5 mg daily, Lantus insulin 12 units daily at bedtime. Check blood sugars before meals and at bedtime.      CBG (last 3)  Recent Labs    04/20/18 1702 04/20/18 2114 04/21/18 0639  GLUCAP 111* 185* 134*  now on amaryl 1mg  qam---increase to 2mg  daily 11. Hyperlipidemia. Lipitor 12. Hypothyroidism. Continue Synthroid 13.  Acute blood loss anemia:              Hemoglobin 9.3 on 4/4 follow-up 10.8 on 4/13 14.  CKD stage III:             Creatinine 1.11 on 4/4. 1.22 on  4/6  -follow up labs monday      LOS: 14 days A FACE TO FACE EVALUATION WAS PERFORMED  Ranelle Oyster 04/21/2018, 10:44 AM

## 2018-04-21 NOTE — Progress Notes (Signed)
Occupational Therapy Session Note  Patient Details  Name: Jessica Meyer MRN: [id number] Date of Birth: 04-11-1935  Today's Date: 04/21/2018 OT Individual Time: 1020-1047 OT Individual Time Calculation (min): 27 min   Short Term Goals: Week 1:  OT Short Term Goal 1 (Week 1): Pt will maintain static sitting balance EOB with mod A for 1 minute during ADL task OT Short Term Goal 1 - Progress (Week 1): Not met OT Short Term Goal 2 (Week 1): Pt will attend to L visual field for ~30 seconds with moderate verbal cueing  OT Short Term Goal 2 - Progress (Week 1): Not met OT Short Term Goal 3 (Week 1): Pt will initiate giving caregiver instructions with L UE positioning  OT Short Term Goal 3 - Progress (Week 1): Not met OT Short Term Goal 4 (Week 1): Pt will don shirt at bed level with mod A OT Short Term Goal 4 - Progress (Week 1): Not met  Skilled Therapeutic Interventions/Progress Updates:    Pt greeted in bed, requesting to use bedpan. She rolled towards Lt with Mod A for bedpan placement. We used chair position function of bed to increase ease of voiding, and when she reported being done, there was bedpan overflow from urine. 2 assist for perihygiene and bed change with pt rolling towards Rt with Max A. When rolling towards Lt, pt needed [facility] for placing her hand on bedrail to assist. Increased time and multimodal cuing required for pt to follow 1 step instruction throughout, as she perseverated on drinking water. She continued to have BM during perihygiene, and therefore this also required extra time. At end of session pt was left in bed with all needs within reach and bed alarm set.   Therapy Documentation Precautions:  Precautions Precautions: Fall Precaution Comments: left inattention and right gaze, increased extensor tone LLE and Flexion tone LUE at shoulder and elbow Restrictions Weight Bearing Restrictions: No Vital Signs: Therapy Vitals Pulse Rate: 67 Resp: 18 BP: 118/60 Patient  Position (if appropriate): Lying Oxygen Therapy SpO2: 97 % O2 Device: Room Air Pain: No s/s pain during tx Pain Assessment Pain Scale: Faces Faces Pain Scale: No hurt ADL: ADL Eating: Maximal assistance Where Assessed-Eating: Bed level Grooming: Moderate assistance Where Assessed-Grooming: Bed level Upper Body Bathing: Maximal assistance Where Assessed-Upper Body Bathing: Bed level Lower Body Bathing: Maximal assistance Where Assessed-Lower Body Bathing: Bed level Upper Body Dressing: Maximal assistance Where Assessed-Upper Body Dressing: Bed level Lower Body Dressing: Dependent Where Assessed-Lower Body Dressing: Bed level Toileting: Dependent Where Assessed-Toileting: Bed level Toilet Transfer: Unable to assess      Therapy/Group: Individual Therapy  Boleslaw Borghi A Kato Wieczorek 04/21/2018, 4:15 PM

## 2018-04-21 NOTE — Progress Notes (Signed)
Speech Language Pathology Daily Session Note  Patient Details  Name: Audrina Karpenko MRN: [id number] Date of Birth: April 21, 1935  Today's Date: 04/21/2018 SLP Individual Time: 7342-8768 SLP Individual Time Calculation (min): 45 min  Short Term Goals: Week 3: SLP Short Term Goal 1 (Week 3): Pt will consume trials of thin liquids via cup sips with minimal overt s/s of aspiration over 3 sessions to demonstrate readiness for diet upgrade.  SLP Short Term Goal 2 (Week 3): Pt will consume trials of dysphagia 2 with minimal overt s/s of aspiration/dysphagia over 3 sessions to demonstrate readiness for diet upgrade.  SLP Short Term Goal 3 (Week 3): Pt will follow 1 step simple directions in 5 out of 10 opportunities with Mod A cues over 3 sessions.  SLP Short Term Goal 4 (Week 3): Pt will answer basic yes/no questions related to self/orientation/current situation in 5 out of 10 opportunities with Mod A cues over 3 sessions.  SLP Short Term Goal 5 (Week 3): Pt will complete basic familiar problem solving tasks related to ADLs (washing her face, brushing teeth, brushing hair) with Mod A cues.  SLP Short Term Goal 6 (Week 3): Pt will sustain attention to task for ~ 5 minutes with Mod A cues for redirection to task.   Skilled Therapeutic Interventions:    Skilled treatment session focused on dysphagia and cognition goals. Nursing assisted pt with lunch prior to knowing SLP wanted to observe pt. Therefore SLP provided skilled observation of pt consuming thin liquids via cup sips. SLP provided coffee. When coffee was initially hot, pt limited bolus size and no overt s/s of aspiration were present. However as coffee cooled, pt with larger sips and more left anterior spillage and coughing present. Pt aware of spilling liquids but was not able to carryover strategy of smaller sips. SLP further faciltiated session by providing Mod A cues to answer basic yes/no questions targeting orientation, personal information and  information regarding physical deficits related to CVA. Pt able to obtain ~ 75% accuracy with Mod A yes/no questions. After information by SLP, pt able to recall information after ~ 3 minutes (such as current month). Given question cue - "what do you see on your left?" - pt able to look and list several items on window sill. Pt left upright in bed, bed alarm on and all needs within reach. Continue per current plan of care.   Pain Pain Assessment Pain Scale: Faces Faces Pain Scale: No hurt  Therapy/Group: Individual Therapy  Hudsen Fei 04/21/2018, 1:44 PM

## 2018-04-21 NOTE — Plan of Care (Signed)
  Problem: Consults Goal: RH STROKE PATIENT EDUCATION Description See Patient Education module for education specifics  Outcome: Progressing Goal: Diabetes Guidelines if Diabetic/Glucose > 140 Description If diabetic or lab glucose is > 140 mg/dl - Initiate Diabetes/Hyperglycemia Guidelines & Document Interventions  Outcome: Progressing   Problem: RH BOWEL ELIMINATION Goal: RH STG MANAGE BOWEL WITH ASSISTANCE Description STG Manage Bowel with mod Assistance.  Outcome: Progressing Goal: RH STG MANAGE BOWEL W/MEDICATION W/ASSISTANCE Description STG Manage Bowel with Medication with mod  Assistance.  Outcome: Progressing   Problem: RH BLADDER ELIMINATION Goal: RH STG MANAGE BLADDER WITH ASSISTANCE Description STG Manage Bladder With mod Assistance  Outcome: Progressing   Problem: RH SKIN INTEGRITY Goal: RH STG SKIN FREE OF INFECTION/BREAKDOWN Description Skin free from infection entire stay on rehab  Outcome: Progressing   Problem: RH SAFETY Goal: RH STG ADHERE TO SAFETY PRECAUTIONS W/ASSISTANCE/DEVICE Description STG Adhere to Safety Precautions With mod  Assistance/Device.  Outcome: Progressing   Problem: RH PAIN MANAGEMENT Goal: RH STG PAIN MANAGED AT OR BELOW PT'S PAIN GOAL Description Pain less than 2  Outcome: Progressing   Problem: RH KNOWLEDGE DEFICIT Goal: RH STG INCREASE KNOWLEDGE OF DIABETES Description Increase knowledge on DM mod I  Outcome: Not Progressing Goal: RH STG INCREASE KNOWLEDGE OF HYPERTENSION Description Increase knowledge on hypertension mod I  Outcome: Not Progressing Goal: RH STG INCREASE KNOWLEDGE OF DYSPHAGIA/FLUID INTAKE Description Increase knowledge on dysphagia/ fluid intake mod assistance  Outcome: Not Progressing Goal: RH STG INCREASE KNOWLEGDE OF HYPERLIPIDEMIA Description Increase knowledge on hyperlipedemia moderate assistance  Outcome: Not Progressing Goal: RH STG INCREASE KNOWLEDGE OF STROKE  PROPHYLAXIS Description Increase knowledge on stroke prophylaxis moderate assistance  Outcome: Not Progressing

## 2018-04-22 ENCOUNTER — Inpatient Hospital Stay (HOSPITAL_COMMUNITY): Payer: Medicare Other | Admitting: Physical Therapy

## 2018-04-22 ENCOUNTER — Inpatient Hospital Stay (HOSPITAL_COMMUNITY): Payer: Medicare Other | Admitting: Occupational Therapy

## 2018-04-22 LAB — GLUCOSE, CAPILLARY
Glucose-Capillary: 108 mg/dL — ABNORMAL HIGH (ref 70–99)
Glucose-Capillary: 177 mg/dL — ABNORMAL HIGH (ref 70–99)
Glucose-Capillary: 203 mg/dL — ABNORMAL HIGH (ref 70–99)
Glucose-Capillary: 96 mg/dL (ref 70–99)

## 2018-04-22 NOTE — Plan of Care (Signed)
  Problem: Consults Goal: RH STROKE PATIENT EDUCATION Description See Patient Education module for education specifics  Outcome: Progressing Goal: Nutrition Consult-if indicated Outcome: Progressing Goal: Diabetes Guidelines if Diabetic/Glucose > 140 Description If diabetic or lab glucose is > 140 mg/dl - Initiate Diabetes/Hyperglycemia Guidelines & Document Interventions  Outcome: Progressing   Problem: RH BOWEL ELIMINATION Goal: RH STG MANAGE BOWEL WITH ASSISTANCE Description STG Manage Bowel with mod Assistance.  Outcome: Progressing Goal: RH STG MANAGE BOWEL W/MEDICATION W/ASSISTANCE Description STG Manage Bowel with Medication with mod  Assistance.  Outcome: Progressing   Problem: RH BLADDER ELIMINATION Goal: RH STG MANAGE BLADDER WITH ASSISTANCE Description STG Manage Bladder With mod Assistance  Outcome: Progressing   Problem: RH SKIN INTEGRITY Goal: RH STG SKIN FREE OF INFECTION/BREAKDOWN Description Skin free from infection entire stay on rehab  Outcome: Progressing   Problem: RH SAFETY Goal: RH STG ADHERE TO SAFETY PRECAUTIONS W/ASSISTANCE/DEVICE Description STG Adhere to Safety Precautions With mod  Assistance/Device.  Outcome: Progressing   Problem: RH PAIN MANAGEMENT Goal: RH STG PAIN MANAGED AT OR BELOW PT'S PAIN GOAL Description Pain less than 2  Outcome: Progressing   Problem: RH KNOWLEDGE DEFICIT Goal: RH STG INCREASE KNOWLEDGE OF DIABETES Description Increase knowledge on DM mod I  Outcome: Not Progressing Goal: RH STG INCREASE KNOWLEDGE OF HYPERTENSION Description Increase knowledge on hypertension mod I  Outcome: Not Progressing Goal: RH STG INCREASE KNOWLEDGE OF DYSPHAGIA/FLUID INTAKE Description Increase knowledge on dysphagia/ fluid intake mod assistance  Outcome: Not Progressing Goal: RH STG INCREASE KNOWLEGDE OF HYPERLIPIDEMIA Description Increase knowledge on hyperlipedemia moderate assistance  Outcome: Not Progressing Goal: RH  STG INCREASE KNOWLEDGE OF STROKE PROPHYLAXIS Description Increase knowledge on stroke prophylaxis moderate assistance  Outcome: Not Progressing

## 2018-04-22 NOTE — Progress Notes (Signed)
Physical Therapy Session Note  Patient Details  Name: Jessica Meyer MRN: [id number] Date of Birth: Mar 24, 1935  Today's Date: 04/22/2018 PT Individual Time: 7062-3762 PT Individual Time Calculation (min): 38 min   Short Term Goals: Week 2:  PT Short Term Goal 1 (Week 2): Pt will perform sit<>stand using LRAD with mod A of +2 PT Short Term Goal 2 (Week 2): Pt will perform bed<>chair transfers with Max assist of +1 PT Short Term Goal 3 (Week 2): Pt will maintain static/dynamic sitting balance with min A during functional tasks.  Skilled Therapeutic Interventions/Progress Updates: Pt presented in TIS chair agreeable to therapy. Pt transported to hallway and participated in STS at wall rail. Pt performed STS 3 times facing wall with maxA x 2 due to increased pushing/L lean. First trial pt was able to maintain standing with maxA x 1 however as pt became fatigued pt requiring maxA x2. Pt required repositioning of LLE in standing due to increased extensor tone with pt requiring a dependent assisted transfer back to chair on second attempt due to tone. Pt attempted SB transfer to mat with max to total A x 2. However with increased impulsivity when attempting transfer pt began sliding forward requiring total A for repositioning. Remaining session focused on repositioning in seat to achieve midline and repositioning of head to neutral. Pt remained in TIS at end of session with belt alarm on and call bell within reach.      Therapy Documentation Precautions:  Precautions Precautions: Fall Precaution Comments: left inattention and right gaze, increased extensor tone LLE and Flexion tone LUE at shoulder and elbow Restrictions Weight Bearing Restrictions: No General:   Vital Signs: Therapy Vitals Temp: 97.6 F (36.4 C) Temp Source: Oral Pulse Rate: 63 Resp: 18 BP: 123/60 Patient Position (if appropriate): Lying Oxygen Therapy SpO2: 100 % O2 Device: Room Air Pain: Pain Assessment Pain Scale:  0-10 Pain Score: Asleep Pain Type: Acute pain Pain Location: Back Pain Orientation: Mid Pain Descriptors / Indicators: Aching Pain Onset: Gradual Patients Stated Pain Goal: 2 Pain Intervention(s): Medication (See eMAR)     Therapy/Group: Individual Therapy  Elgie Landino  Marilyn Wing, PTA  04/22/2018, 3:35 PM

## 2018-04-22 NOTE — Progress Notes (Signed)
[place] PHYSICAL MEDICINE & REHABILITATION PROGRESS NOTE  Subjective/Complaints:  No complaints. Slept well. Asked me how she is doing. Talked to me about her pets  ROS: Patient denies fever, rash, sore throat, blurred vision, nausea, vomiting, diarrhea, cough, shortness of breath or chest pain, joint or back pain, headache, or mood change.    Objective: Vital Signs: Blood pressure 121/65, pulse 72, temperature 97.8 F (36.6 C), temperature source Oral, resp. rate 18, height 5\' 5"  (1.651 m), weight 66.1 kg, SpO2 96 %. No results found. No results for input(s): WBC, HGB, HCT, PLT in the last 72 hours. No results for input(s): NA, K, CL, CO2, GLUCOSE, BUN, CREATININE, CALCIUM in the last 72 hours.  Physical Exam: BP 121/65 (BP Location: Right Arm)   Pulse 72   Temp 97.8 F (36.6 C) (Oral)   Resp 18   Ht 5\' 5"  (1.651 m)   Wt 66.1 kg   SpO2 96%   BMI 24.26 kg/m  Constitutional: No distress . Vital signs reviewed. HEENT: EOMI, oral membranes moist Neck: supple Cardiovascular: RRR without murmur. No JVD    Respiratory: CTA Bilaterally without wheezes or rales. Normal effort    GI: BS +, non-tender, non-distended  Musculoskeletal: right trap tender? Neurological: She is alert Left-sided neglect   Follows commands.    Poor insight into deficits Dysarthria Motor: LLE: 0/5 proximal to distal 0/5 left upper extremity unchanged RLE: 5/5 proximal to distal Sensation absent to light touch LLE  Poor attention and concentration Increased ankle plantar flexor tone MAS 3 Left elbow flexors and finger flexors  -no changes in motor exam Skin: Skin is warm and dry.  Psychiatric: flat   Assessment/Plan: 1. Functional deficits secondary to right MCA infarct which require 3+ hours per day of interdisciplinary therapy in a comprehensive inpatient rehab setting.  Physiatrist is providing close team supervision and 24 hour management of active medical problems listed  below.  Physiatrist and rehab team continue to assess barriers to discharge/monitor patient progress toward functional and medical goals  Care Tool:  Bathing  Bathing activity did not occur: Safety/medical concerns Body parts bathed by patient: Chest, Abdomen, Face(pt did 25% of L arm)   Body parts bathed by helper: Left lower leg, Right lower leg, Left upper leg, Right upper leg, Right arm, Left arm Body parts n/a: Front perineal area, Buttocks(pt already cleansed by nursing)   Bathing assist       Upper Body Dressing/Undressing Upper body dressing   What is the patient wearing?: Pull over shirt    Upper body assist Assist Level: Maximal Assistance - Patient 25 - 49%    Lower Body Dressing/Undressing Lower body dressing    Lower body dressing activity did not occur: Safety/medical concerns What is the patient wearing?: Pants     Lower body assist Assist for lower body dressing: Total Assistance - Patient < 25%     Toileting Toileting    Toileting assist Assist for toileting: 2 Helpers     Transfers Chair/bed transfer  Transfers assist  Chair/bed transfer activity did not occur: Safety/medical concerns  Chair/bed transfer assist level: Total Assistance - Patient < 25%     Locomotion Ambulation   Ambulation assist   Ambulation activity did not occur: Safety/medical concerns          Walk 10 feet activity   Assist  Walk 10 feet activity did not occur: Safety/medical concerns        Walk 50 feet activity   Assist Walk  50 feet with 2 turns activity did not occur: Safety/medical concerns         Walk 150 feet activity   Assist Walk 150 feet activity did not occur: Safety/medical concerns         Walk 10 feet on uneven surface  activity   Assist Walk 10 feet on uneven surfaces activity did not occur: Safety/medical concerns         Wheelchair     Assist Will patient use wheelchair at discharge?: Yes Type of Wheelchair:  Manual    Wheelchair assist level: Dependent - Patient 0% Max wheelchair distance: 150 ft    Wheelchair 50 feet with 2 turns activity    Assist        Assist Level: Dependent - Patient 0%   Wheelchair 150 feet activity     Assist     Assist Level: Dependent - Patient 0%      Medical Problem List and Plan: 1.  Left side weakness with dysphagia secondary to right MCA infarction in the setting of right ICA and right MCA occlusion status post unsuccessful attempts at thrombectomy. CIR PT, OT, SLP.    -wearing PRAFO and WHO QHS  some hamstrings tone developing as well. Hesitant to add zanaflex given sedating side effects    . 2.  Antithrombotics: -DVT/anticoagulation:  SCDs             -antiplatelet therapy: aspirin 81 mg daily, Plavix 75 mg daily 3. Pain Management:  Tylenol 3 for headache as needed, flank pain has subsided, right-sided neck pain is the current complaint. Xray confirmed cervical spondylosis +/- DDD exacerbated by neck positioning from Left neglect Kpad , sportscreme Robaxin change T#4 to hydrocodone- no c/os at present 4. Mood: Wellbutrin 300 mg daily,Lexapro 5 mg daily at bedtime             -antipsychotic agents: N/A 5. Neuropsych: This patient is not fully capable of making decisions on her own behalf. 6. Skin/Wound Care:  Routine skin checks 7. Fluids/Electrolytes/Nutrition:  encourage PO 8. Dysphagia. Dysphagia #1 honey liquids.  Follow-up speech therapy             Advance diet as tolerated- per SLP 9. Hypertension. Norvasc 5 mg daily,lisinopril 40 mg daily, Coreg 12.5 mg twice a day.   Controlled 4/19 10. Diabetes mellitus. Hemoglobin A1c 8.0. Glucotrol XL 2.5 mg daily, Lantus insulin 12 units daily at bedtime. Check blood sugars before meals and at bedtime.      CBG (last 3)  Recent Labs    04/21/18 1722 04/21/18 2115 04/22/18 0632  GLUCAP 112* 229* 108*  amaryl increased to 2mg  daily 4/18 11. Hyperlipidemia. Lipitor 12. Hypothyroidism.  Continue Synthroid 13.  Acute blood loss anemia:              Hemoglobin 9.3 on 4/4 follow-up 10.8 on 4/13 14.  CKD stage III:             Creatinine 1.11 on 4/4. 1.22 on 4/6  -follow up labs monday      LOS: 15 days A FACE TO FACE EVALUATION WAS PERFORMED  Ranelle Oyster 04/22/2018, 10:39 AM

## 2018-04-22 NOTE — Progress Notes (Signed)
Occupational Therapy Session Note  Patient Details  Name: Jessica Meyer MRN: [id number] Date of Birth: 20-Oct-1935  Today's Date: 04/22/2018 OT Individual Time: 5456-2563 OT Individual Time Calculation (min): 73 min   Skilled Therapeutic Interventions/Progress Updates:    Pt greeted in bed, reporting that her brief needed changing. 2 assist for repositioning in bed and for hygiene/brief change with pt rolling Rt>Lt with [facility] for placement of Rt hand on bedrail as needed. Afterwards, pt donned pants while supine. Able to pull pants up a little over thighs. With OT and tech stabilizing her feet, pt bridged several times to elevate pants over hips. To elevate pants fully, she still needed to roll Rt>Lt (with Min A towards Lt, Max A towards Rt). Supine<sit completed with 2 helpers for safety. 2 assist for squat pivot transfer>w/c with total facilitation for trunk flexion and hands in lap to reduce Lt pushing and extensor tone. While in TIS, pt ate breakfast. Placed her tray towards Lt of midline to promote scanning and head turns. L UE slightly extended while stabilizing plate and oatmeal cup with HOH for tone reduction. Unable to track spoon on Lt side when OT assisted her with drinking beverages. Vcs required for swallowing strategies and for Lt pocketing. OT cut food into small pieces to promote small bites and she needed max cuing for slowing pace of consumption. No coughing noted during session. At end of tx pt was reclined in TIS with safety belt fastened, Lt arm supported, and call bell placed on Rt side. Tx focus placed on Lt NMR, Lt attention, functional transfers, following 1 step instruction, and ADL retraining.      Therapy Documentation Precautions:  Precautions Precautions: Fall Precaution Comments: left inattention and right gaze, increased extensor tone LLE and Flexion tone LUE at shoulder and elbow Restrictions Weight Bearing Restrictions: No Pain: When L LE was repositioned at times.  Pt with no s/s pain at rest   ADL: ADL Eating: Maximal assistance Where Assessed-Eating: Bed level Grooming: Moderate assistance Where Assessed-Grooming: Bed level Upper Body Bathing: Maximal assistance Where Assessed-Upper Body Bathing: Bed level Lower Body Bathing: Maximal assistance Where Assessed-Lower Body Bathing: Bed level Upper Body Dressing: Maximal assistance Where Assessed-Upper Body Dressing: Bed level Lower Body Dressing: Dependent Where Assessed-Lower Body Dressing: Bed level Toileting: Dependent Where Assessed-Toileting: Bed level Toilet Transfer: Unable to assess     Therapy/Group: Individual Therapy  Tationna Fullard A Tee Richeson 04/22/2018, 12:25 PM

## 2018-04-23 ENCOUNTER — Inpatient Hospital Stay (HOSPITAL_COMMUNITY): Payer: Medicare Other | Admitting: Speech Pathology

## 2018-04-23 ENCOUNTER — Inpatient Hospital Stay (HOSPITAL_COMMUNITY): Payer: Medicare Other | Admitting: Physical Therapy

## 2018-04-23 ENCOUNTER — Inpatient Hospital Stay (HOSPITAL_COMMUNITY): Payer: Medicare Other | Admitting: Occupational Therapy

## 2018-04-23 LAB — GLUCOSE, CAPILLARY
Glucose-Capillary: 103 mg/dL — ABNORMAL HIGH (ref 70–99)
Glucose-Capillary: 117 mg/dL — ABNORMAL HIGH (ref 70–99)
Glucose-Capillary: 158 mg/dL — ABNORMAL HIGH (ref 70–99)
Glucose-Capillary: 158 mg/dL — ABNORMAL HIGH (ref 70–99)
Glucose-Capillary: 119 mg/dL — ABNORMAL HIGH (ref 70–99)

## 2018-04-23 NOTE — Progress Notes (Signed)
Physical Therapy Weekly Progress Note  Patient Details  Name: Jessica Meyer MRN: [id number] Date of Birth: Dec 25, 1935  Beginning of progress report period: April 16, 2018 End of progress report period: April 23, 2018  Today's Date: 04/23/2018 PT Individual Time: 1333-1430 PT Individual Time Calculation (min): 57 min   Patient has met 0 of 6 short term goals.  Progress has been limited by significant confusion, patient will eventually follow cues however demonstrates little to no carryover of education and techniques/mobility between sessions and continues to require assist of +2 for majority of mobility.   Patient continues to demonstrate the following deficits muscle weakness and muscle joint tightness, decreased cardiorespiratoy endurance, impaired timing and sequencing, abnormal tone, unbalanced muscle activation, decreased coordination and decreased motor planning, decreased midline orientation, decreased attention to left and decreased motor planning, decreased initiation, decreased attention, decreased awareness, decreased problem solving, decreased safety awareness, decreased memory and delayed processing and decreased sitting balance, decreased standing balance, decreased postural control, hemiplegia and decreased balance strategies and therefore will continue to benefit from skilled PT intervention to increase functional independence with mobility.  Patient progressing toward long term goals..  Continue plan of care.   PT Short Term Goals Week 1:  PT Short Term Goal 1 (Week 1): Pt will perform bed<>chair transfer with Max assist +1 PT Short Term Goal 1 - Progress (Week 1): Not met PT Short Term Goal 2 (Week 1): Pt will initiate gait training PT Short Term Goal 2 - Progress (Week 1): Not met PT Short Term Goal 3 (Week 1): Pt will maintain static/dynamic sitting balance with CGA-min assist during functional tasks PT Short Term Goal 3 - Progress (Week 1): Not met Week 2:  PT Short  Term Goal 1 (Week 2): Pt will perform sit<>stand using LRAD with mod A of +2 PT Short Term Goal 1 - Progress (Week 2): Not met PT Short Term Goal 2 (Week 2): Pt will perform bed<>chair transfers with Max assist of +1 PT Short Term Goal 2 - Progress (Week 2): Not met PT Short Term Goal 3 (Week 2): Pt will maintain static/dynamic sitting balance with min A during functional tasks. PT Short Term Goal 3 - Progress (Week 2): Progressing toward goal Week 3:  PT Short Term Goal 1 (Week 3): Patient to be able to maintain static sitting balance with Min guard on 3/5 attempts  PT Short Term Goal 2 (Week 3): Patient to be able to perform sliding board transfer with maxA of 1  PT Short Term Goal 3 (Week 3): Patient to be able to tolerate standing in standing frame for at least 15 minutes with Mod cues for postural corrections   Skilled Therapeutic Interventions/Progress Updates:    Patient received up in TIS WC, pleasantly confused and willing to participate in session today. Transported to gym totalA in [facility], and continued working in standing frame. Continues to require Max cues and assist for postural corrections when in standing frame, but able to participate in two rounds (approximately 8-10 minutes each) of simple cognitive tasks involving taking colored blocks from therapist by order of color specified by PT, also in building and taking down block tower with max hand over hand assist. She continues to state "what? What do you want me to do now?" and requires Max cues for redirection to and attention to task. TotalA for positioning of L LE while in standing frame, continue to note tight hamstrings and heel cords along with tone in LEs and patient appeared  to receive beneficial stretch while in standing frame. Required max-totalAx2 for squat-pivot back to bed and for sit to supine. Worked on manual techniques to reduce tone in L LE and also worked on heel cord stretching in bed. She was left in bed with all needs  met, bed alarm active this afternoon.   Therapy Documentation Precautions:  Precautions Precautions: Fall Precaution Comments: left inattention and right gaze, increased extensor tone LLE and Flexion tone LUE at shoulder and elbow Restrictions Weight Bearing Restrictions: No General:   Vital Signs:  Pain: Pain Assessment Pain Scale: Faces Faces Pain Scale: No hurt Pain Type: Acute pain Pain Location: Shoulder Pain Orientation: Right Pain Radiating Towards: neck Pain Descriptors / Indicators: Aching;Discomfort Pain Frequency: Constant Pain Onset: On-going Patients Stated Pain Goal: 2 Pain Intervention(s): Medication (See eMAR)   Therapy/Group: Individual Therapy   Deniece Ree PT, DPT, CBIS  Supplemental Physical Therapist [facility]    Pager [phone] Acute Rehab Office [phone]    04/23/2018, 3:50 PM

## 2018-04-23 NOTE — Progress Notes (Signed)
Occupational Therapy Session Note  Patient Details  Name: Jessica Meyer MRN: [id number] Date of Birth: [birth year]  Today's Date: 04/23/2018 OT Individual Time: 1020-1102 OT Individual Time Calculation (min): 42 min   Short Term Goals: Week 2:  OT Short Term Goal 1 (Week 2): Pt will maintain static sitting balance on EOB with max A for 1 minute OT Short Term Goal 2 (Week 2): Pt will attend to L visual field to locate self care items with max cuing.   Skilled Therapeutic Interventions/Progress Updates:    Pt greeted in bed, reporting brief was soiled. Brief and bed were soiled. Perihygiene and brief change completed with Total A. Pt rolling Rt>Lt with Min A towards Lt, and Max A towards Rt. Attempted bridges to have pt assist with elevating pants over hips, but pt unable to maintain attention, sequence, and visually attend to what she was doing to assist. 2 assist required at this time for stabilizing her feet on the bed. Once pants were donned, pt completed supine<sit with Max A. Total facilitation for flexed trunk with hands in lap to minimize Lt pushing and extensor tone. 2 assist for squat pivot<TIS. When she was slightly reclined, pt consumed water. Handed her teaspoons of honey thickened water on Lt side. Pt with dysmetria when reaching for spoon and when bringing spoon to mouth. Vcs required for safe swallowing strategies with no coughing during tx. After handwashing, pt was reclined further in TIS with safety belt fastened. She was left with call bell on Rt side. Tx focus was placed on Lt attention/visual scanning, following 1 step instruction, ADL retraining, and functional transfers.   Therapy Documentation Precautions:  Precautions Precautions: Fall Precaution Comments: left inattention and right gaze, increased extensor tone LLE and Flexion tone LUE at shoulder and elbow Restrictions Weight Bearing Restrictions: No Pain: in neck. Provided her with MHP at end of session to address   Pain Assessment Pain Score: 6  ADL: ADL Eating: Maximal assistance Where Assessed-Eating: Bed level Grooming: Moderate assistance Where Assessed-Grooming: Bed level Upper Body Bathing: Maximal assistance Where Assessed-Upper Body Bathing: Bed level Lower Body Bathing: Maximal assistance Where Assessed-Lower Body Bathing: Bed level Upper Body Dressing: Maximal assistance Where Assessed-Upper Body Dressing: Bed level Lower Body Dressing: Dependent Where Assessed-Lower Body Dressing: Bed level Toileting: Dependent Where Assessed-Toileting: Bed level Toilet Transfer: Unable to assess      Therapy/Group: Individual Therapy  Dani Danis A Tharon Bomar 04/23/2018, 11:56 AM

## 2018-04-23 NOTE — Progress Notes (Signed)
Occupational Therapy Session Note  Patient Details  Name: Jessica Meyer MRN: [id number] Date of Birth: 07/09/1935  Today's Date: 04/23/2018 OT Individual Time: 9892-1194 OT Individual Time Calculation (min): 58 min    Short Term Goals: Week 2:  OT Short Term Goal 1 (Week 2): Pt will maintain static sitting balance on EOB with max A for 1 minute OT Short Term Goal 2 (Week 2): Pt will attend to L visual field to locate self care items with max cuing.   Skilled Therapeutic Interventions/Progress Updates:    Upon entering the room, pt supine in bed with c/o pain in neck and OT applied muscle rub per pt request. Pt also with c/o pain in L hand and L LE described as pins and needles. OT notified RN. Pt rolling L <> R with mod cuing and max A to don LB clothing with total A. Pt looking to the L to locate and pick all clothing and self care items with max cuing for head turn. Pt donning pull over shirt with max A and max cuing for hemiplegic technique. Pt refusing to exit bed or sit EOB. OT removing L resting hand splint to check skin intergrity. OT attempting to stretch in all planes but increased tone noted in all directions. Pt remained in bed with call bell and all needed items within reach upon exiting the room.   Therapy Documentation Precautions:  Precautions Precautions: Fall Precaution Comments: left inattention and right gaze, increased extensor tone LLE and Flexion tone LUE at shoulder and elbow Restrictions Weight Bearing Restrictions: No    ADL: ADL Eating: Maximal assistance Where Assessed-Eating: Bed level Grooming: Moderate assistance Where Assessed-Grooming: Bed level Upper Body Bathing: Maximal assistance Where Assessed-Upper Body Bathing: Bed level Lower Body Bathing: Maximal assistance Where Assessed-Lower Body Bathing: Bed level Upper Body Dressing: Maximal assistance Where Assessed-Upper Body Dressing: Bed level Lower Body Dressing: Dependent Where Assessed-Lower  Body Dressing: Bed level Toileting: Dependent Where Assessed-Toileting: Bed level Toilet Transfer: Unable to assess   Therapy/Group: Individual Therapy  Alen Bleacher 04/23/2018, 2:30 PM

## 2018-04-23 NOTE — Progress Notes (Signed)
Speech Language Pathology Daily Session Note  Patient Details  Name: Jessica Meyer MRN: [id number] Date of Birth: Aug 24, 1935  Today's Date: 04/23/2018 SLP Individual Time: 0254-2706 SLP Individual Time Calculation (min): 45 min  Short Term Goals: Week 3: SLP Short Term Goal 1 (Week 3): Pt will consume trials of thin liquids via cup sips with minimal overt s/s of aspiration over 3 sessions to demonstrate readiness for diet upgrade.  SLP Short Term Goal 2 (Week 3): Pt will consume trials of dysphagia 2 with minimal overt s/s of aspiration/dysphagia over 3 sessions to demonstrate readiness for diet upgrade.  SLP Short Term Goal 3 (Week 3): Pt will follow 1 step simple directions in 5 out of 10 opportunities with Mod A cues over 3 sessions.  SLP Short Term Goal 4 (Week 3): Pt will answer basic yes/no questions related to self/orientation/current situation in 5 out of 10 opportunities with Mod A cues over 3 sessions.  SLP Short Term Goal 5 (Week 3): Pt will complete basic familiar problem solving tasks related to ADLs (washing her face, brushing teeth, brushing hair) with Mod A cues.  SLP Short Term Goal 6 (Week 3): Pt will sustain attention to task for ~ 5 minutes with Mod A cues for redirection to task.   Skilled Therapeutic Interventions:  Skilled treatment session focused on cognition goals. SLP facilitated session by providing Mod A cues to answer basic yes/no questions to achieve ~ 90% accuracy. Pt's hearing aids in place but she remains HOH. Pt felt very irritable this morning with multiple attempts made to help pt become more comfortable. Nothing successful but pt able to complete session but required frequent redirection d/t internal irritability. Pt able to follow 1 step directions related to herself with Min A cues to achieve ~ 70% accuracy. SLP further facilitiated session by providing Max A cues to follow directions related to her left side (such as touch your left knee) to increase  awareness of left. Support provided to pt and pt left in most comfortable positioning. Bed alarm on and all needs within reach. Continue per current plan of care.      Pain Pain Assessment Pain Scale: 0-10 Pain Score: 6  Pain Type: Acute pain Pain Location: Head Pain Orientation: Upper Pain Radiating Towards: shoulder and neck Pain Descriptors / Indicators: Aching;Discomfort;Spasm Pain Frequency: Constant Pain Onset: On-going Patients Stated Pain Goal: 4 Pain Intervention(s): Medication (See eMAR)  Therapy/Group: Individual Therapy  Dalaya Suppa 04/23/2018, 10:12 AM

## 2018-04-23 NOTE — Progress Notes (Signed)
[place] PHYSICAL MEDICINE & REHABILITATION PROGRESS NOTE  Subjective/Complaints:  No issues overnite , asking about d/c date Sleeping but awakens to voice, has some Right sided neck pain, reviewed the importance of proper positioning  ROS: Patient denies CP, SOB, N/V/D    Objective: Vital Signs: Blood pressure (!) 112/55, pulse 65, temperature 97.6 F (36.4 C), temperature source Oral, resp. rate 14, height 5\' 5"  (1.651 m), weight 66.1 kg, SpO2 93 %. No results found. No results for input(s): WBC, HGB, HCT, PLT in the last 72 hours. No results for input(s): NA, K, CL, CO2, GLUCOSE, BUN, CREATININE, CALCIUM in the last 72 hours.  Physical Exam: BP (!) 112/55 (BP Location: Right Arm)   Pulse 65   Temp 97.6 F (36.4 C) (Oral)   Resp 14   Ht 5\' 5"  (1.651 m)   Wt 66.1 kg   SpO2 93%   BMI 24.26 kg/m  Constitutional: No distress . Vital signs reviewed. HEENT: EOMI, oral membranes moist Neck: supple Cardiovascular: RRR without murmur. No JVD    Respiratory: CTA Bilaterally without wheezes or rales. Normal effort    GI: BS +, non-tender, non-distended  Musculoskeletal: right trap tender? Neurological: She is alert Left-sided neglect   Follows commands.    Poor insight into deficits Dysarthria Motor: LLE: 0/5 proximal to distal 0/5 left upper extremity unchanged RLE: 5/5 proximal to distal Sensation absent to light touch LLE  Poor attention and concentration Increased ankle plantar flexor tone MAS 3 Left elbow flexors and finger flexors , MAS 2-3 in Left knee flexors -no changes in motor exam Skin: Skin is warm and dry.  Psychiatric: flat   Assessment/Plan: 1. Functional deficits secondary to right MCA infarct which require 3+ hours per day of interdisciplinary therapy in a comprehensive inpatient rehab setting.  Physiatrist is providing close team supervision and 24 hour management of active medical problems listed below.  Physiatrist and rehab team continue to  assess barriers to discharge/monitor patient progress toward functional and medical goals  Care Tool:  Bathing  Bathing activity did not occur: Safety/medical concerns Body parts bathed by patient: Chest, Abdomen, Face(pt did 25% of L arm)   Body parts bathed by helper: Left lower leg, Right lower leg, Left upper leg, Right upper leg, Right arm, Left arm Body parts n/a: Front perineal area, Buttocks(pt already cleansed by nursing)   Bathing assist       Upper Body Dressing/Undressing Upper body dressing   What is the patient wearing?: Pull over shirt    Upper body assist Assist Level: Maximal Assistance - Patient 25 - 49%    Lower Body Dressing/Undressing Lower body dressing    Lower body dressing activity did not occur: Safety/medical concerns What is the patient wearing?: Pants     Lower body assist Assist for lower body dressing: Total Assistance - Patient < 25%     Toileting Toileting    Toileting assist Assist for toileting: 2 Helpers     Transfers Chair/bed transfer  Transfers assist  Chair/bed transfer activity did not occur: Safety/medical concerns  Chair/bed transfer assist level: Total Assistance - Patient < 25%     Locomotion Ambulation   Ambulation assist   Ambulation activity did not occur: Safety/medical concerns          Walk 10 feet activity   Assist  Walk 10 feet activity did not occur: Safety/medical concerns        Walk 50 feet activity   Assist Walk 50 feet with 2  turns activity did not occur: Safety/medical concerns         Walk 150 feet activity   Assist Walk 150 feet activity did not occur: Safety/medical concerns         Walk 10 feet on uneven surface  activity   Assist Walk 10 feet on uneven surfaces activity did not occur: Safety/medical concerns         Wheelchair     Assist Will patient use wheelchair at discharge?: Yes Type of Wheelchair: Manual    Wheelchair assist level: Dependent -  Patient 0% Max wheelchair distance: 150 ft    Wheelchair 50 feet with 2 turns activity    Assist        Assist Level: Dependent - Patient 0%   Wheelchair 150 feet activity     Assist     Assist Level: Dependent - Patient 0%      Medical Problem List and Plan: 1.  Left side weakness with dysphagia secondary to right MCA infarction in the setting of right ICA and right MCA occlusion status post unsuccessful attempts at thrombectomy. CIR PT, OT, SLP.    -wearing PRAFO and WHO QHS  Hamstrings, equinovarus and biceps tone increasing  . 2.  Antithrombotics: -DVT/anticoagulation:  SCDs             -antiplatelet therapy: aspirin 81 mg daily, Plavix 75 mg daily 3. Pain Management:  Tylenol 3 for headache as needed, flank pain has subsided, right-sided neck pain is the current complaint. Xray confirmed cervical spondylosis +/- DDD exacerbated by neck positioning from Left neglect Kpad , sportscreme Robaxin  hydrocodone-Positional challenges pt keeps head turned toward the Left 4. Mood: Wellbutrin 300 mg daily,Lexapro 5 mg daily at bedtime             -antipsychotic agents: N/A 5. Neuropsych: This patient is not fully capable of making decisions on her own behalf. 6. Skin/Wound Care:  Routine skin checks 7. Fluids/Electrolytes/Nutrition:  encourage PO 8. Dysphagia. Dysphagia #1 honey liquids.  Follow-up speech therapy             Advance diet as tolerated- per SLP 9. Hypertension. Norvasc 5 mg daily,lisinopril 40 mg daily, Coreg 12.5 mg twice a day.    Vitals:   04/22/18 1945 04/23/18 0423  BP: 107/60 (!) 112/55  Pulse: 74 65  Resp: 14 14  Temp: 98.3 F (36.8 C) 97.6 F (36.4 C)  SpO2: 99% 93%   10. Diabetes mellitus. Hemoglobin A1c 8.0. Glucotrol XL 2.5 mg daily, Lantus insulin 12 units daily at bedtime. Check blood sugars before meals and at bedtime.      CBG (last 3)  Recent Labs    04/22/18 1638 04/22/18 2103 04/23/18 0616  GLUCAP 203* 96 103*  amaryl  increased to 2mg  daily 4/18, monitor as steady state levels achieved  11. Hyperlipidemia. Lipitor 12. Hypothyroidism. Continue Synthroid 13.  Acute blood loss anemia:              Hemoglobin 9.3 on 4/4 follow-up 10.8 on 4/13 14.  CKD stage III:             Creatinine 1.11 on 4/4. 1.22 on 4/6  stable      LOS: 16 days A FACE TO FACE EVALUATION WAS PERFORMED  Jessica Meyer Jessica Meyer 04/23/2018, 8:19 AM

## 2018-04-24 ENCOUNTER — Inpatient Hospital Stay (HOSPITAL_COMMUNITY): Payer: Medicare Other | Admitting: Occupational Therapy

## 2018-04-24 ENCOUNTER — Inpatient Hospital Stay (HOSPITAL_COMMUNITY): Payer: Medicare Other | Admitting: Speech Pathology

## 2018-04-24 ENCOUNTER — Inpatient Hospital Stay (HOSPITAL_COMMUNITY): Payer: Medicare Other | Admitting: *Deleted

## 2018-04-24 LAB — GLUCOSE, CAPILLARY
Glucose-Capillary: 96 mg/dL (ref 70–99)
Glucose-Capillary: 193 mg/dL — ABNORMAL HIGH (ref 70–99)
Glucose-Capillary: 115 mg/dL — ABNORMAL HIGH (ref 70–99)
Glucose-Capillary: 171 mg/dL — ABNORMAL HIGH (ref 70–99)

## 2018-04-24 NOTE — Progress Notes (Addendum)
Speech Language Pathology Daily Session Note  Patient Details  Name: Jessica Meyer MRN: [id number] Date of Birth: [birth year]  Today's Date: 04/24/2018 SLP Individual Time: 0900-1000 SLP Individual Time Calculation (min): 60 min  Short Term Goals: Week 3: SLP Short Term Goal 1 (Week 3): Pt will consume trials of thin liquids via cup sips with minimal overt s/s of aspiration over 3 sessions to demonstrate readiness for diet upgrade.  SLP Short Term Goal 2 (Week 3): Pt will consume trials of dysphagia 2 with minimal overt s/s of aspiration/dysphagia over 3 sessions to demonstrate readiness for diet upgrade.  SLP Short Term Goal 3 (Week 3): Pt will follow 1 step simple directions in 5 out of 10 opportunities with Mod A cues over 3 sessions.  SLP Short Term Goal 4 (Week 3): Pt will answer basic yes/no questions related to self/orientation/current situation in 5 out of 10 opportunities with Mod A cues over 3 sessions.  SLP Short Term Goal 5 (Week 3): Pt will complete basic familiar problem solving tasks related to ADLs (washing her face, brushing teeth, brushing hair) with Mod A cues.  SLP Short Term Goal 6 (Week 3): Pt will sustain attention to task for ~ 5 minutes with Mod A cues for redirection to task.   Skilled Therapeutic Interventions: Pt was seen for skilled ST intervention targeting cognition, including attention to the left, direction following, and functional problem solving. Pt was sleeping soundly upon arrival of SLP, but awakened with verbal and tactile stim (wet cloth to face). Pt was initially confused, asking if it was about breakfast time (pt had eaten breakfast several hours before). Pt verbalized the need to use the bedpan, and required cues to recall use of call bell. Pt then required mod assist to locate call bell (which was in bed next to her). Pt was accurate for orientation to person, place, time, and situation. She verbalized "walking" and "learning to talk" as therapy goals.  Mod-max cues required to scan left of midline to name family members in a photograph (an activity which was completed last week with pt). Moderate cueing required for pt to name items on the left side of pt's bed (chair, curtain, pictures, flowers, cards). Pt was observed taking meds whole in puree (RN provided). No obvious oral difficulty or residue, and no overt s/s aspiration observed.  Pt was left in bed with alarm set, all needs within reach. Continue per current plan of care.   Pain Pain Assessment Pain Scale: Faces Faces Pain Scale: Hurts little more Pain Type: Acute pain Pain Location: Neck Pain Orientation: Right Pain Radiating Towards: neck Pain Descriptors / Indicators: Grimacing Pain Frequency: Constant Pain Onset: On-going Patients Stated Pain Goal: 4 Pain Intervention(s): Repositioned;Distraction;Emotional support  Therapy/Group: Individual Therapy   Jessica Meyer, [facility], CCC-SLP Speech Language Pathologist  Leigh Aurora 04/24/2018, 12:34 PM

## 2018-04-24 NOTE — Progress Notes (Signed)
Occupational Therapy Weekly Progress Note  Patient Details  Name: Jessica Meyer MRN: [id number] Date of Birth: [birth year]  Beginning of progress report period: April 16, 2018 End of progress report period: April 24, 2018  Today's Date: 04/24/2018 OT Individual Time: 1000-1043 OT Individual Time Calculation (min): 43 min    Patient has met 0 of 2 short term goals. Pt making limited progress this week towards occupational therapy goals. Pt continues to be Max A - Total A of 2 for self care and transfers. Pt continues to need max cuing to manual assist to locate items from midline to the L. Pt continues to need max cuing to attend to task as well. She is very externally and internally distracted. Pt does not recall information from prior sessions. Pt is tolerating L resting hand splint but reports increased pain with any stretching performed.   Patient continues to demonstrate the following deficits: muscle weakness, decreased cardiorespiratoy endurance, impaired timing and sequencing, abnormal tone, decreased coordination and decreased motor planning, field cut, decreased midline orientation, decreased attention to left and left side neglect, decreased initiation, decreased attention, decreased awareness, decreased problem solving, decreased safety awareness, decreased memory and delayed processing and decreased sitting balance, decreased standing balance, decreased postural control, hemiplegia and decreased balance strategies and therefore will continue to benefit from skilled OT intervention to enhance overall performance with Reduce care partner burden.  Patient progressing toward long term goals..  Continue plan of care.  OT Short Term Goals Week 2:  OT Short Term Goal 1 (Week 2): Pt will maintain static sitting balance on EOB with max A for 1 minute OT Short Term Goal 1 - Progress (Week 2): Not met OT Short Term Goal 2 (Week 2): Pt will attend to L visual field to locate self care items  with max cuing.  OT Short Term Goal 2 - Progress (Week 2): Not met Week 3:  OT Short Term Goal 1 (Week 3): Pt will attend to L visual field to locate self care items with max cuing. OT Short Term Goal 2 (Week 3): Pt will perform UB dressing with mod A to decrease caregiver assistance.   Skilled Therapeutic Interventions/Progress Updates:    Upon entering the room, pt supine in bed and agreeable to OT intervention. Pt verbalizing need to void and rolling L <> R for placement of bedpan with max A. As soon as pt placed on bed pan she verbalized, " I don't like this. Take it off." Pt did not void and was not on bedpan longer than 3 minutes. OT provided total A to don LB clothing items and max A for UB self care with max cuing on hemiplegic technique. Attempting to have pt locate items on L side but needing total A to assist with  Manual head turn to locate items. Supine >sit with +2 for safety. Stand pivot transfer with R LE blocked to the R with total A and second helper to steady equipment. Once halfway in chair, pt pushing with R UE and OT placing UE in lap to position. Pt very upset over being placed into TIS. Chair alarm belt donned and pt tilted for safety. OT exited the room secondary to pt becoming agitated with OT being present and not returning her to bed. Call bell and all needed items within reach.   Therapy Documentation Precautions:  Precautions Precautions: Fall Precaution Comments: left inattention and right gaze, increased extensor tone LLE and Flexion tone LUE at shoulder and elbow Restrictions Weight  Bearing Restrictions: No General: General OT Amount of Missed Time: 15 Minutes Vital Signs: Therapy Vitals Temp: 98.2 F (36.8 C) Temp Source: Oral Pulse Rate: 66 Resp: 16 BP: (!) 104/55 Oxygen Therapy SpO2: 98 % O2 Device: Room Air Pain: Pain Assessment Pain Scale: Faces Faces Pain Scale: Hurts even more Pain Type: Acute pain Pain Location: Neck Pain Orientation:  Right Pain Descriptors / Indicators: Discomfort;Grimacing;Moaning Pain Frequency: Constant Pain Onset: On-going Patients Stated Pain Goal: 3 Pain Intervention(s): Medication (See eMAR) ADL: ADL Eating: Maximal assistance Where Assessed-Eating: Bed level Grooming: Moderate assistance Where Assessed-Grooming: Bed level Upper Body Bathing: Maximal assistance Where Assessed-Upper Body Bathing: Bed level Lower Body Bathing: Maximal assistance Where Assessed-Lower Body Bathing: Bed level Upper Body Dressing: Maximal assistance Where Assessed-Upper Body Dressing: Bed level Lower Body Dressing: Dependent Where Assessed-Lower Body Dressing: Bed level Toileting: Dependent Where Assessed-Toileting: Bed level Toilet Transfer: Unable to assess Vision   Perception    Praxis   Exercises:   Other Treatments:     Therapy/Group: Individual Therapy  Gypsy Decant 04/24/2018, 5:14 PM

## 2018-04-24 NOTE — Progress Notes (Signed)
[place] PHYSICAL MEDICINE & REHABILITATION PROGRESS NOTE  Subjective/Complaints:  SLP in room ready to start, pt unable to attend to left side Oriented to person , place and stroke, asking appropriate questions "will my feeling come back"  ROS: Patient denies CP, SOB, N/V/D    Objective: Vital Signs: Blood pressure (!) 133/47, pulse 60, temperature 98.2 F (36.8 C), temperature source Oral, resp. rate 18, height 5\' 5"  (1.651 m), weight 66.1 kg, SpO2 97 %. No results found. No results for input(s): WBC, HGB, HCT, PLT in the last 72 hours. No results for input(s): NA, K, CL, CO2, GLUCOSE, BUN, CREATININE, CALCIUM in the last 72 hours.  Physical Exam: BP (!) 133/47 (BP Location: Right Arm)   Pulse 60   Temp 98.2 F (36.8 C) (Oral)   Resp 18   Ht 5\' 5"  (1.651 m)   Wt 66.1 kg   SpO2 97%   BMI 24.26 kg/m  Constitutional: No distress . Vital signs reviewed. HEENT: EOMI, oral membranes moist Neck: supple Cardiovascular: RRR without murmur. No JVD    Respiratory: CTA Bilaterally without wheezes or rales. Normal effort    GI: BS +, non-tender, non-distended  Musculoskeletal: right trap tender? Neurological: She is alert Left-sided neglect   Follows commands.    Poor insight into deficits Dysarthria Motor: LLE: 0/5 proximal to distal 0/5 left upper extremity unchanged RLE: 5/5 proximal to distal Sensation absent to light touch LLE  Poor attention and concentration Increased ankle plantar flexor tone MAS 3 Left elbow flexors and finger flexors , MAS 2-3 in Left knee flexors -no changes in motor exam Skin: Skin is warm and dry.  Psychiatric: flat   Assessment/Plan: 1. Functional deficits secondary to right MCA infarct which require 3+ hours per day of interdisciplinary therapy in a comprehensive inpatient rehab setting.  Physiatrist is providing close team supervision and 24 hour management of active medical problems listed below.  Physiatrist and rehab team continue  to assess barriers to discharge/monitor patient progress toward functional and medical goals  Care Tool:  Bathing  Bathing activity did not occur: Safety/medical concerns Body parts bathed by patient: Chest, Abdomen, Face(pt did 25% of L arm)   Body parts bathed by helper: Left lower leg, Right lower leg, Left upper leg, Right upper leg, Right arm, Left arm Body parts n/a: Front perineal area, Buttocks(pt already cleansed by nursing)   Bathing assist       Upper Body Dressing/Undressing Upper body dressing   What is the patient wearing?: Pull over shirt    Upper body assist Assist Level: Maximal Assistance - Patient 25 - 49%    Lower Body Dressing/Undressing Lower body dressing    Lower body dressing activity did not occur: Safety/medical concerns What is the patient wearing?: Pants, Incontinence brief     Lower body assist Assist for lower body dressing: Total Assistance - Patient < 25%     Toileting Toileting    Toileting assist Assist for toileting: 2 Helpers     Transfers Chair/bed transfer  Transfers assist  Chair/bed transfer activity did not occur: Safety/medical concerns  Chair/bed transfer assist level: 2 Helpers     Locomotion Ambulation   Ambulation assist   Ambulation activity did not occur: Safety/medical concerns          Walk 10 feet activity   Assist  Walk 10 feet activity did not occur: Safety/medical concerns        Walk 50 feet activity   Assist Walk 50 feet with  2 turns activity did not occur: Safety/medical concerns         Walk 150 feet activity   Assist Walk 150 feet activity did not occur: Safety/medical concerns         Walk 10 feet on uneven surface  activity   Assist Walk 10 feet on uneven surfaces activity did not occur: Safety/medical concerns         Wheelchair     Assist Will patient use wheelchair at discharge?: Yes Type of Wheelchair: Manual    Wheelchair assist level: Dependent -  Patient 0% Max wheelchair distance: 150 ft    Wheelchair 50 feet with 2 turns activity    Assist        Assist Level: Dependent - Patient 0%   Wheelchair 150 feet activity     Assist     Assist Level: Dependent - Patient 0%      Medical Problem List and Plan: 1.  Left side weakness with dysphagia secondary to right MCA infarction in the setting of right ICA and right MCA occlusion status post unsuccessful attempts at thrombectomy. CIR PT, OT, SLP.    -wearing PRAFO and WHO QHS  Hamstrings, equinovarus and biceps tone increasing   Pushers syndrome toward left Team conf in am 2.  Antithrombotics: -DVT/anticoagulation:  SCDs             -antiplatelet therapy: aspirin 81 mg daily, Plavix 75 mg daily 3. Pain Management:  Tylenol 3 for headache as needed, flank pain has subsided, right-sided neck pain is the current complaint. Xray confirmed cervical spondylosis +/- DDD exacerbated by neck positioning from Left neglect Kpad , sportscreme Robaxin  hydrocodone-Positional challenges pt keeps head turned toward the Left 4. Mood: Wellbutrin 300 mg daily,Lexapro 5 mg daily at bedtime             -antipsychotic agents: N/A 5. Neuropsych: This patient is not fully capable of making decisions on her own behalf. 6. Skin/Wound Care:  Routine skin checks 7. Fluids/Electrolytes/Nutrition:  encourage PO 8. Dysphagia. Dysphagia #1 honey liquids.  Follow-up speech therapy             Advance diet as tolerated- per SLP 9. Hypertension. Norvasc 5 mg daily,lisinopril 40 mg daily, Coreg 12.5 mg twice a day.    Vitals:   04/23/18 1921 04/24/18 0432  BP: (!) 102/51 (!) 133/47  Pulse: 62 60  Resp: 18 18  Temp: 98 F (36.7 C) 98.2 F (36.8 C)  SpO2: 95% 97%  Controlled 4/21 10. Diabetes mellitus. Hemoglobin A1c 8.0. Glucotrol XL 2.5 mg daily, Lantus insulin 12 units daily at bedtime. Check blood sugars before meals and at bedtime.      CBG (last 3)  Recent Labs    04/23/18 1745  04/23/18 2104 04/24/18 0625  GLUCAP 158* 119* 96  amaryl increased to 2mg  daily 4/18, controlled 4/21 11. Hyperlipidemia. Lipitor 12. Hypothyroidism. Continue Synthroid 13.  Acute blood loss anemia:              Hemoglobin 9.3 on 4/4 follow-up 10.8 on 4/13 14.  CKD stage III:             Creatinine 1.11 on 4/4. 1.22 on 4/6  stable      LOS: 17 days A FACE TO FACE EVALUATION WAS PERFORMED  Erick Colace 04/24/2018, 9:05 AM

## 2018-04-24 NOTE — Progress Notes (Signed)
Physical Therapy Session Note  Patient Details  Name: Jessica Meyer MRN: [id number] Date of Birth: April 24, 1935  Today's Date: 04/24/2018 PT Individual Time: 1405-1500 PT Individual Time Calculation (min): 55 min   Short Term Goals: Week 3:  PT Short Term Goal 1 (Week 3): Patient to be able to maintain static sitting balance with Min guard on 3/5 attempts  PT Short Term Goal 2 (Week 3): Patient to be able to perform sliding board transfer with maxA of 1  PT Short Term Goal 3 (Week 3): Patient to be able to tolerate standing in standing frame for at least 15 minutes with Mod cues for postural corrections   Skilled Therapeutic Interventions/Progress Updates: Pt presented in TIS requesting to use toilet. Performed stand pivot to [facility] with maxA x2 with PTA blocking RLE to minimize pushing. Pt required max cues for sequencing with pt intermittently yelling "what do you want me to do" and not responding to PTA's cues. Pt sat on BSC with maxA due to significant L lean with pt eventually progressing to modA but unable to sustain for more than a few seconds (-void). Pt returned to TIS in same manner. Pt transported to ortho gym and participated in standing frame for posture and NMR. Pt required multiple attempts for standing due to pt's RLE becoming internally rotated with each attempt. On final attempt pt was able to come to full standing and maintain tolerance in standing frame for approx 5-25mn while pt participated in reaching activities to R (picking post it notes and cups). Pt was able to improve wt shifting to R and maintain mild line with brief cues for short durations. Pt notably becoming fatigued and increasing lean to L when returned to sitting. Pt transported back to room and performed stand pivot to R with maxA x2 and sit to supine maxA x 2. Pt positioned to comfort and left with bed alarm on, call bell within reach and needs met.      Therapy Documentation Precautions:   Precautions Precautions: Fall Precaution Comments: left inattention and right gaze, increased extensor tone LLE and Flexion tone LUE at shoulder and elbow Restrictions Weight Bearing Restrictions: No General:   Vital Signs: Therapy Vitals Temp: 98.2 F (36.8 C) Temp Source: Oral Pulse Rate: 66 Resp: 16 BP: (!) 104/55 Oxygen Therapy SpO2: 98 % O2 Device: Room Air Pain: Pain Assessment Pain Scale: Faces Faces Pain Scale: Hurts little more Pain Type: Acute pain Pain Location: Neck Pain Descriptors / Indicators: Grimacing Pain Onset: On-going Patients Stated Pain Goal: 4 Pain Intervention(s): Repositioned;Distraction;Emotional support   Therapy/Group: Individual Therapy  Ernie Kasler  Sirenity Shew, PTA  04/24/2018, 3:56 PM

## 2018-04-24 NOTE — Progress Notes (Signed)
Occupational Therapy Session Note  Patient Details  Name: Jessica Meyer MRN: [id number] Date of Birth: 04/17/1935  Today's Date: 04/24/2018 OT Individual Time: 1230-1313 OT Individual Time Calculation (min): 43 min    Short Term Goals: Week 2:  OT Short Term Goal 1 (Week 2): Pt will maintain static sitting balance on EOB with max A for 1 minute OT Short Term Goal 2 (Week 2): Pt will attend to L visual field to locate self care items with max cuing.   Skilled Therapeutic Interventions/Progress Updates:    Pt seen for OT session focusing on self-feeding and attention to L. Pt in tilt-in-space w/c upon arrival, awake and agreeable to tx session and eating lunch. Pt with un-rated complaints of pain in L UE, described similar to nerve pain. ROM and positioning provided, noted increased flexor synergy tone pattern.  Pt taken to therapy day to eat lunch at high/low table. L UE placed on table for increased attention, cuing to attempt to have pt manage UE to place and position on table. She ate lunch with set-up and make cuing to slow rate of speed and oral clearing due to L holding without attention. Pt required max-total multi-modal cuing to scan environment to locate silverware just L of midline. Pt ate ~40% of meal. She required max cuing for attention to task. She was able to follow one step commands with increased time.  Pt returned to room at end of session, repositioned in chair and left in tilt-in-space w/c with chair belt alarm on and all needs in reach.   Therapy Documentation Precautions:  Precautions Precautions: Fall Precaution Comments: left inattention and right gaze, increased extensor tone LLE and Flexion tone LUE at shoulder and elbow Restrictions Weight Bearing Restrictions: No   Therapy/Group: Individual Therapy  Zacherie Honeyman L 04/24/2018, 7:09 AM

## 2018-04-25 ENCOUNTER — Inpatient Hospital Stay (HOSPITAL_COMMUNITY): Payer: Medicare Other | Admitting: Physical Therapy

## 2018-04-25 ENCOUNTER — Encounter (HOSPITAL_COMMUNITY): Payer: Medicare Other | Admitting: Psychology

## 2018-04-25 ENCOUNTER — Inpatient Hospital Stay (HOSPITAL_COMMUNITY): Payer: Medicare Other | Admitting: Occupational Therapy

## 2018-04-25 ENCOUNTER — Inpatient Hospital Stay (HOSPITAL_COMMUNITY): Payer: Medicare Other | Admitting: Speech Pathology

## 2018-04-25 LAB — GLUCOSE, CAPILLARY
Glucose-Capillary: 115 mg/dL — ABNORMAL HIGH (ref 70–99)
Glucose-Capillary: 191 mg/dL — ABNORMAL HIGH (ref 70–99)
Glucose-Capillary: 106 mg/dL — ABNORMAL HIGH (ref 70–99)
Glucose-Capillary: 145 mg/dL — ABNORMAL HIGH (ref 70–99)

## 2018-04-25 NOTE — Progress Notes (Signed)
Speech Language Pathology Daily Session Note  Patient Details  Name: Jessica Meyer MRN: [id number] Date of Birth: 1935/01/09  Today's Date: 04/25/2018 SLP Individual Time: 0830-0900 SLP Individual Time Calculation (min): 30 min  Short Term Goals: Week 3: SLP Short Term Goal 1 (Week 3): Pt will consume trials of thin liquids via cup sips with minimal overt s/s of aspiration over 3 sessions to demonstrate readiness for diet upgrade.  SLP Short Term Goal 2 (Week 3): Pt will consume trials of dysphagia 2 with minimal overt s/s of aspiration/dysphagia over 3 sessions to demonstrate readiness for diet upgrade.  SLP Short Term Goal 3 (Week 3): Pt will follow 1 step simple directions in 5 out of 10 opportunities with Mod A cues over 3 sessions.  SLP Short Term Goal 4 (Week 3): Pt will answer basic yes/no questions related to self/orientation/current situation in 5 out of 10 opportunities with Mod A cues over 3 sessions.  SLP Short Term Goal 5 (Week 3): Pt will complete basic familiar problem solving tasks related to ADLs (washing her face, brushing teeth, brushing hair) with Mod A cues.  SLP Short Term Goal 6 (Week 3): Pt will sustain attention to task for ~ 5 minutes with Mod A cues for redirection to task.   Skilled Therapeutic Interventions: Pt was seen for skilled ST intervention targeting cognition, including attention to the left, direction following, and functional problem solving. Pt was sleeping upon arrival of SLP, but awakened easily with verbal stim. Pt was able to follow verbal 1-step commands with 100% accuracy, with intermittent need for repetition due to decreased hearing acuity. Pt answered simple yes/no questions accurately as well. When given a washcloth, pt was able to wash her face unassisted, with appropriate attention to the left side of her face as well. Pt was also able to perform oral care after set up.  Good attention to task in quiet environment. Pt remains accurate for  orientation questions. Left inattention/neglect continues to be a primary functional issue. Pt accepted trials of ice chips without overt s/s aspiration. Will continue to work torward advanced diet.  Pt was left in bed with alarm set, all needs within reach. Continue per current plan of care.   Pain Pain Assessment Pain Scale: 0-10 Pain Score: 9  Pain Type: Acute pain Pain Location: Neck  Therapy/Group: Individual Therapy   Celia B. Murvin Natal, [facility], CCC-SLP Speech Language Pathologist  Jessica Meyer 04/25/2018, 12:44 PM

## 2018-04-25 NOTE — Progress Notes (Signed)
Physical Therapy Session Note  Patient Details  Name: Jessica Meyer MRN: [id number] Date of Birth: 1935-12-19  Today's Date: 04/25/2018 PT Individual Time: 1207-1231 PT Individual Time Calculation (min): 24 min   Short Term Goals: Week 3:  PT Short Term Goal 1 (Week 3): Patient to be able to maintain static sitting balance with Min guard on 3/5 attempts  PT Short Term Goal 2 (Week 3): Patient to be able to perform sliding board transfer with maxA of 1  PT Short Term Goal 3 (Week 3): Patient to be able to tolerate standing in standing frame for at least 15 minutes with Mod cues for postural corrections   Skilled Therapeutic Interventions/Progress Updates: Pt presented in bed requesting to use bed pan. Pt performed rolling L/R maxA to L total A to R for bed pan placement (+ void). Pt repositioned total A x 1 and boosted to [facility] dependent x 2. Pt then participated in lateral leans to R and encouraged to maintain sitting in neutral which pt was able to maintain for bouts up to 30 sec. Pt left sitting up in bed with alarm on and call bell within reach.      Therapy Documentation Precautions:  Precautions Precautions: Fall Precaution Comments: left inattention and right gaze, increased extensor tone LLE and Flexion tone LUE at shoulder and elbow Restrictions Weight Bearing Restrictions: No    Therapy/Group: Individual Therapy  Tamilyn Lupien  Leemon Ayala, PTA  04/25/2018, 12:37 PM

## 2018-04-25 NOTE — Progress Notes (Signed)
Physical Therapy Session Note  Patient Details  Name: Jessica Meyer MRN: [id number] Date of Birth: Sep 23, 1935  Today's Date: 04/25/2018 PT Individual Time: 0900-0958 PT Individual Time Calculation (min): 58 min   Short Term Goals: Week 3:  PT Short Term Goal 1 (Week 3): Patient to be able to maintain static sitting balance with Min guard on 3/5 attempts  PT Short Term Goal 2 (Week 3): Patient to be able to perform sliding board transfer with maxA of 1  PT Short Term Goal 3 (Week 3): Patient to be able to tolerate standing in standing frame for at least 15 minutes with Mod cues for postural corrections   Skilled Therapeutic Interventions/Progress Updates:    Patient received in bed, calm and cooperative with therapy this morning and able to interact appropriately and in a clear manner with PT/tech this morning. Required MinA for rolling L but maxA for rolling right, continues to be totalAx2 for donning shirt and pants today. Required maxAx1 for supine to sit, and MaxAx2 for stand-pivot transfer into TIS WC. Dependent for WC mobility to PT gym. Tolerated two bouts of standing in standing frame for 8 then and 4 minutes with Max cues for postural corrections due to strong L lean, L neglect, tone, and flexed posture,  and Max cues for participation in building activity with foam lego blocks. TotalA for positioning of L LE into appropriate location for standing in frame. Cognition continued to fade and patient required heavier and heavier cues for cognition throughout session. She was left in TIS [facility] in care of PT tech for return to room, all needs otherwise met this morning.   Therapy Documentation Precautions:  Precautions Precautions: Fall Precaution Comments: left inattention and right gaze, increased extensor tone LLE and Flexion tone LUE at shoulder and elbow Restrictions Weight Bearing Restrictions: No General:   Vital Signs:   Pain: Pain Assessment Pain Scale: Faces Pain Score:  0 Faces Pain Scale: No hurt     Therapy/Group: Individual Therapy   Deniece Ree PT, DPT, CBIS  Supplemental Physical Therapist [facility]    Pager [phone] Acute Rehab Office [phone]    04/25/2018, 12:57 PM

## 2018-04-25 NOTE — Progress Notes (Addendum)
[place] PHYSICAL MEDICINE & REHABILITATION PROGRESS NOTE  Subjective/Complaints:  "how am I doing?" Reviewed improvements in cognition , pt is still pushing during postion changes, discussed tone with PT, worst at biceps and hamstrings  ROS: Patient denies CP, SOB, N/V/D    Objective: Vital Signs: Blood pressure 111/74, pulse 72, temperature 98.6 F (37 C), temperature source Oral, resp. rate 18, height 5' 5"  (1.651 m), weight 66.2 kg, SpO2 100 %. No results found. No results for input(s): WBC, HGB, HCT, PLT in the last 72 hours. No results for input(s): NA, K, CL, CO2, GLUCOSE, BUN, CREATININE, CALCIUM in the last 72 hours.  Physical Exam: BP 111/74 (BP Location: Right Arm)   Pulse 72   Temp 98.6 F (37 C) (Oral)   Resp 18   Ht 5' 5"  (1.651 m)   Wt 66.2 kg   SpO2 100%   BMI 24.29 kg/m  Constitutional: No distress . Vital signs reviewed. HEENT: EOMI, oral membranes moist Neck: supple Cardiovascular: RRR without murmur. No JVD    Respiratory: CTA Bilaterally without wheezes or rales. Normal effort    GI: BS +, non-tender, non-distended  Musculoskeletal: right trap tender? Neurological: She is alert Left-sided neglect   Follows commands.    Poor insight into deficits Dysarthria Motor: LLE: 0/5 proximal to distal 0/5 left upper extremity unchanged RLE: 5/5 proximal to distal Sensation absent to light touch LLE  Poor attention and concentration Increased ankle plantar flexor tone MAS 3 Left elbow flexors and finger flexors , MAS 2-3 in Left knee flexors -no changes in motor exam Skin: Skin is warm and dry.  Psychiatric: flat   Assessment/Plan: 1. Functional deficits secondary to right MCA infarct which require 3+ hours per day of interdisciplinary therapy in a comprehensive inpatient rehab setting.  Physiatrist is providing close team supervision and 24 hour management of active medical problems listed below.  Physiatrist and rehab team continue to assess  barriers to discharge/monitor patient progress toward functional and medical goals  Care Tool:  Bathing  Bathing activity did not occur: Safety/medical concerns Body parts bathed by patient: Chest, Abdomen, Face(pt did 25% of L arm)   Body parts bathed by helper: Left lower leg, Right lower leg, Left upper leg, Right upper leg, Right arm, Left arm Body parts n/a: Front perineal area, Buttocks(pt already cleansed by nursing)   Bathing assist       Upper Body Dressing/Undressing Upper body dressing   What is the patient wearing?: Pull over shirt    Upper body assist Assist Level: Maximal Assistance - Patient 25 - 49%    Lower Body Dressing/Undressing Lower body dressing    Lower body dressing activity did not occur: Safety/medical concerns What is the patient wearing?: Pants, Incontinence brief     Lower body assist Assist for lower body dressing: Total Assistance - Patient < 25%     Toileting Toileting    Toileting assist Assist for toileting: 2 Helpers     Transfers Chair/bed transfer  Transfers assist  Chair/bed transfer activity did not occur: Safety/medical concerns  Chair/bed transfer assist level: 2 Helpers     Locomotion Ambulation   Ambulation assist   Ambulation activity did not occur: Safety/medical concerns          Walk 10 feet activity   Assist  Walk 10 feet activity did not occur: Safety/medical concerns        Walk 50 feet activity   Assist Walk 50 feet with 2 turns activity did not  occur: Safety/medical concerns         Walk 150 feet activity   Assist Walk 150 feet activity did not occur: Safety/medical concerns         Walk 10 feet on uneven surface  activity   Assist Walk 10 feet on uneven surfaces activity did not occur: Safety/medical concerns         Wheelchair     Assist Will patient use wheelchair at discharge?: Yes Type of Wheelchair: Manual    Wheelchair assist level: Dependent - Patient  0% Max wheelchair distance: 150 ft    Wheelchair 50 feet with 2 turns activity    Assist        Assist Level: Dependent - Patient 0%   Wheelchair 150 feet activity     Assist     Assist Level: Dependent - Patient 0%      Medical Problem List and Plan: 1.  Left side weakness with dysphagia secondary to right MCA infarction in the setting of right ICA and right MCA occlusion status post unsuccessful attempts at thrombectomy. CIR PT, OT, SLP.   Team conference today please see physician documentation under team conference tab, met with team face-to-face to discuss problems,progress, and goals. Formulized individual treatment plan based on medical history, underlying problem and comorbidities. 2.  Antithrombotics: -DVT/anticoagulation:  SCDs             -antiplatelet therapy: aspirin 81 mg daily, Plavix 75 mg daily 3. Pain Management:  Tylenol 3 for headache as needed, flank pain has subsided, right-sided neck pain is the current complaint. Xray confirmed cervical spondylosis +/- DDD exacerbated by neck positioning from Left neglect Kpad , sportscreme Robaxin  hydrocodone-Positional challenges pt keeps head turned toward the Left 4. Mood: Wellbutrin 300 mg daily,Lexapro 5 mg daily at bedtime             -antipsychotic agents: N/A 5. Neuropsych: This patient is not fully capable of making decisions on her own behalf. 6. Skin/Wound Care:  Routine skin checks 7. Fluids/Electrolytes/Nutrition:  encourage PO 8. Dysphagia. Dysphagia #1 honey liquids.  Follow-up speech therapy             Advance diet as tolerated- per SLP 9. Hypertension. Norvasc 5 mg daily,lisinopril 40 mg daily, Coreg 12.5 mg twice a day.    Vitals:   04/24/18 1920 04/25/18 0548  BP: (!) 103/53 111/74  Pulse: 67 72  Resp: 18 18  Temp: 98 F (36.7 C) 98.6 F (37 C)  SpO2: 97% 100%  Controlled 4/22 10. Diabetes mellitus. Hemoglobin A1c 8.0. Glucotrol XL 2.5 mg daily, Lantus insulin 12 units daily at  bedtime. Check blood sugars before meals and at bedtime.      CBG (last 3)  Recent Labs    04/24/18 1640 04/24/18 2111 04/25/18 0612  GLUCAP 115* 171* 115*  amaryl increased to 53m daily 4/18, controlled 4/22 11. Hyperlipidemia. Lipitor 12. Hypothyroidism. Continue Synthroid 13.  Acute blood loss anemia:              Hemoglobin 9.3 on 4/4 follow-up 10.8 on 4/13 14.  CKD stage III:             Creatinine 1.11 on 4/4. 1.22 on 4/6  stable  Discussed pt's daughter, DTeofilo Pod  use of BOTOX for spasticity, will see if samples are available at office      LOS: 18 days A FACE TO FSidneyE Jamicah Meyer 04/25/2018, 9:19 AM

## 2018-04-25 NOTE — Consult Note (Signed)
Neuropsychological Consultation   Patient:   Jessica Meyer   DOB:   03/14/1935  MR Number:  [id number]  [facility]  [facility] [address] RE[facility] A [address] [id number] [place] [id number] Dept: [phone] Loc: [phone]           Date of Service:   04/25/2018  Start Time:   10 AM End Time:   11 AM  Provider/Observer:  Jessica Meyer, Psy.D.       Clinical Neuropsychologist       Billing Code/Service: [id number]:  49 minutes spent face to face with charting after of 11 minutes.  Chief Complaint:    Jessica Meyer is an 83 year old female with history of hypertension, hyperlipidemia, diabetes, bilateral knee replacement.  Independent prior to admission.  Presented 04/04/2018 with fall and left sided weakness.  MRI showed bilateral infarcts with greater moderate to large right MCA infarct with additional punctate acute infarctions cerebellum bilaterally.  CT showed large area right MCA penumbra with 9 ml right basal ganglia and possible temporal lobe cortical infarction.  Patient admitted for comprehensive rehab program.   Reason for Service:  HPI: Jessica Meyer is an 83 year old right handed female history of hypertension, hyperlipidemia, diabetes mellitus, bilateral total knee replacement. Per chart review and family, patient lives alone. Independent prior to admission. One level home with 3 steps to entry. She performed her own ADLs as well as housework and cooking. She does not drive. Presented 04/04/2018 with fall and left-sided weakness. No loss of consciousness. CT of the head showed asymmetric hyperattenuation right MCA in the sylvian fissure. CT angiogram of head and neck showed occluded right ICA with trace reconstitution. Critical stenosis left ICA origin. Severe stenosis of bilateral vertebral artery origins, patent vertebral arteries. CT perfusion scan showed a large area right MCA penumbra with 9 mL right basal ganglia  and possible temporal lobe cortical infarction. MRI brain reviewed, showing bilateral infarcts, >moderate to large right MCA infarct.  Per report, right MCA infarction with additional punctate acute infarctions cerebellum bilaterally. Attempts at mechanical thrombectomy unsuccessful. Echocardiogram with ejection fraction of 65 % hyperdynamic systolic function.No source of embolus. Neurology follow-up maintained on aspirin 81 mg daily and Plavix 3 months and Plavix alone.  Hospital complicated by dysphagia #1 nectar thick liquid. Therapy evaluation completed with recommendations of physical medicine rehabilitation consult. Patient was admitted for a comprehensive rehabilitation program.  Please see preadmission assessment from today as well.  Current Status:   Patient now continues with muscle weakness, decreased endurance, motor and coordination issues., some left side neglect, and balance issues.  Patient has continued with being drowsy and lethargic at times.  Cognitive issues continue.  Patient denies significant depressive or anxiety issues but is motivated to work toward discharge.  Patient's hearing deficits complicate some communications.    Behavioral Observation: Jessica SaxonDoris Ann Meyer  presents as a 83 y.o.-year-old Right Caucasian Female who appeared her stated age. her dress was Appropriate and she was Well Groomed and her manners were Appropriate to the situation.  her participation was indicative of Appropriate and Drowsy behaviors.  There were any physical disabilities noted.  she displayed an appropriate level of cooperation and motivation.     Interactions:    Minimal Appropriate and Drowsy  Attention:   abnormal and attention span appeared shorter than expected for age  Memory:   abnormal; remote memory intact, recent memory impaired  Visuo-spatial:  not examined but has been  some visual inattention and neglect issues.  Speech (Volume):  low  Speech:   normal; slurred  Thought  Process:  Coherent and Relevant  Though Content:  WNL; not suicidal and not homicidal  Orientation:   person, place and situation  Judgment:   Fair  Planning:   Poor  Affect:    Appropriate and Lethargic  Mood:    Dysphoric  Insight:   Fair  Intelligence:   high  Medical History:   Past Medical History:  Diagnosis Date  . Arthritis   . Diabetes mellitus without complication (HCC)   . History of chicken pox   . Hyperlipidemia   . Hypertension   . Thyroid disease    hypothyroid   Psychiatric History:  Patient has no prior psychiatric history  Family Med/Psych History:  Family History  Problem Relation Age of Onset  . Arthritis Mother   . Depression Mother   . Heart attack Father        32   . Hypertension Father     Risk of Suicide/Violence: virtually non-existent Patient denies any SI or HI.  Impression/DX:  Jessica Meyer is an 83 year old female with history of hypertension, hyperlipidemia, diabetes, bilateral knee replacement.  Independent prior to admission.  Presented 04/04/2018 with fall and left sided weakness.  MRI showed bilateral infarcts with greater moderate to large right MCA infarct with additional punctate acute infarctions cerebellum bilaterally.  CT showed large area right MCA penumbra with 9 ml right basal ganglia and possible temporal lobe cortical infarction.  Patient admitted for comprehensive rehab program.   Patient now continues with muscle weakness, decreased endurance, motor and coordination issues., some left side neglect, and balance issues.  Patient has continued with being drowsy and lethargic at times.  Cognitive issues continue.  Patient denies significant depressive or anxiety issues but is motivated to work toward discharge.  Patient's hearing deficits complicate some communications.  Disposition/Plan:  Today, worked on coping and adjustment issues due to recent stroke and extended hospital/rehab work.    Diagnosis:    Right middle  cerebral artery stroke [facility]) - Plan: Ambulatory referral to Neurology  Aspiration into lower respiratory tract - Plan: DG Chest 2 View, DG Chest 2 View  Neck pain on right side - Plan: DG Cervical Spine Complete, DG Cervical Spine Complete         Electronically Signed   _______________________ Jessica Phenix, Psy.D.

## 2018-04-25 NOTE — Progress Notes (Signed)
Occupational Therapy Session Note  Patient Details  Name: Jessica Meyer MRN: [id number] Date of Birth: 06-21-1935  Today's Date: 04/25/2018 OT Individual Time: 1345-1430 OT Individual Time Calculation (min): 45 min  and Today's Date: 04/25/2018 OT Missed Time: 30 Minutes Missed Time Reason: Patient fatigue   Short Term Goals: Week 3:  OT Short Term Goal 1 (Week 3): Pt will attend to L visual field to locate self care items with max cuing. OT Short Term Goal 2 (Week 3): Pt will perform UB dressing with mod A to decrease caregiver assistance.   Skilled Therapeutic Interventions/Progress Updates:    Upon entering the room, pt supine in bed and reporting need for urgent toileting. Pt needing total A for clothing management and placement onto bedpan. Pt unable to void this session. Total A for hygiene and clothing management with second helper assisting with rolling. Total A to roll to the R and pt following commands to use bed rail to roll with mod - max A to the L. Pt left partially rolled towards the left and asked to locate items at midline and to the L with pt needing max cuing to locate and attend to task. Pt declined OOB activites and expressing, " I'm so tired. I could sleep until tomorrow." OT assisted pt with repositioning for comfort. Pt left resting and asleep before therapist exited the room. Bed lowered and bed alarm activated with call bell within reach.   Therapy Documentation Precautions:  Precautions Precautions: Fall Precaution Comments: left inattention and right gaze, increased extensor tone LLE and Flexion tone LUE at shoulder and elbow Restrictions Weight Bearing Restrictions: No General: General OT Amount of Missed Time: 30 Minutes Vital Signs: Therapy Vitals Temp: 98.4 F (36.9 C) Temp Source: Oral Pulse Rate: 63 Resp: 16 BP: 112/64 Patient Position (if appropriate): Lying Oxygen Therapy SpO2: 96 % O2 Device: Room Air Pain: Pain Assessment Pain Scale:  Faces Pain Score: 9  Faces Pain Scale: No hurt Pain Type: Acute pain Pain Location: Neck ADL: ADL Eating: Maximal assistance Where Assessed-Eating: Bed level Grooming: Moderate assistance Where Assessed-Grooming: Bed level Upper Body Bathing: Maximal assistance Where Assessed-Upper Body Bathing: Bed level Lower Body Bathing: Maximal assistance Where Assessed-Lower Body Bathing: Bed level Upper Body Dressing: Maximal assistance Where Assessed-Upper Body Dressing: Bed level Lower Body Dressing: Dependent Where Assessed-Lower Body Dressing: Bed level Toileting: Dependent Where Assessed-Toileting: Bed level Toilet Transfer: Unable to assess   Therapy/Group: Individual Therapy  Alen Bleacher 04/25/2018, 2:40 PM

## 2018-04-26 ENCOUNTER — Inpatient Hospital Stay (HOSPITAL_COMMUNITY): Payer: Medicare Other | Admitting: Speech Pathology

## 2018-04-26 ENCOUNTER — Inpatient Hospital Stay (HOSPITAL_COMMUNITY): Payer: Medicare Other | Admitting: Physical Therapy

## 2018-04-26 ENCOUNTER — Inpatient Hospital Stay (HOSPITAL_COMMUNITY): Payer: Medicare Other | Admitting: Occupational Therapy

## 2018-04-26 LAB — GLUCOSE, CAPILLARY
Glucose-Capillary: 118 mg/dL — ABNORMAL HIGH (ref 70–99)
Glucose-Capillary: 159 mg/dL — ABNORMAL HIGH (ref 70–99)
Glucose-Capillary: 60 mg/dL — ABNORMAL LOW (ref 70–99)
Glucose-Capillary: 105 mg/dL — ABNORMAL HIGH (ref 70–99)
Glucose-Capillary: 62 mg/dL — ABNORMAL LOW (ref 70–99)
Glucose-Capillary: 121 mg/dL — ABNORMAL HIGH (ref 70–99)

## 2018-04-26 NOTE — Progress Notes (Signed)
Occupational Therapy Session Note  Patient Details  Name: Jessica Meyer MRN: [id number] Date of Birth: 1935-05-10  Today's Date: 04/26/2018 OT Individual Time: 0800-0850 OT Individual Time Calculation (min): 50 min    Short Term Goals: Week 3:  OT Short Term Goal 1 (Week 3): Pt will attend to L visual field to locate self care items with max cuing. OT Short Term Goal 2 (Week 3): Pt will perform UB dressing with mod A to decrease caregiver assistance.   Skilled Therapeutic Interventions/Progress Updates:   Upon entering the room, pt supine in bed and reporting need to have BM and asking for bedpan. Pt rolling to the L with max A and use of bed rails to place onto bed pan. Pt unable to have BM but with small smear. OT provided total A for hygiene and LB clothing management. Pt appearing to be lethargic this session and falling asleep during session needing increasing cuing for participation. Pt initially refusing OOB but agreeable with increased encouragement. Supine >sit with max A to EOB. Total A stand pivot transfer into wheelchair and OT placing R UE into lap for repositioning to decrease pushing. Pt asked to locate self care items to the L but unable to locate unless items placed at midline or max cuing.   Therapy Documentation Precautions:  Precautions Precautions: Fall Precaution Comments: left inattention and right gaze, increased extensor tone LLE and Flexion tone LUE at shoulder and elbow Restrictions Weight Bearing Restrictions: No General: General OT Amount of Missed Time: 10 Minutes ADL: ADL Eating: Maximal assistance Where Assessed-Eating: Bed level Grooming: Moderate assistance Where Assessed-Grooming: Bed level Upper Body Bathing: Maximal assistance Where Assessed-Upper Body Bathing: Bed level Lower Body Bathing: Maximal assistance Where Assessed-Lower Body Bathing: Bed level Upper Body Dressing: Maximal assistance Where Assessed-Upper Body Dressing: Bed  level Lower Body Dressing: Dependent Where Assessed-Lower Body Dressing: Bed level Toileting: Dependent Where Assessed-Toileting: Bed level Toilet Transfer: Unable to assess   Therapy/Group: Individual Therapy  Alen Bleacher 04/26/2018, 11:59 AM

## 2018-04-26 NOTE — Progress Notes (Signed)
Speech Language Pathology Daily Session Note  Patient Details  Name: Jessica Meyer MRN: [id number] Date of Birth: Jun 21, 1935  Today's Date: 04/26/2018 SLP Individual Time: 1345-1420 SLP Individual Time Calculation (min): 35 min  Short Term Goals: Week 3: SLP Short Term Goal 1 (Week 3): Pt will consume trials of thin liquids via cup sips with minimal overt s/s of aspiration over 3 sessions to demonstrate readiness for diet upgrade.  SLP Short Term Goal 2 (Week 3): Pt will consume trials of dysphagia 2 with minimal overt s/s of aspiration/dysphagia over 3 sessions to demonstrate readiness for diet upgrade.  SLP Short Term Goal 3 (Week 3): Pt will follow 1 step simple directions in 5 out of 10 opportunities with Mod A cues over 3 sessions.  SLP Short Term Goal 4 (Week 3): Pt will answer basic yes/no questions related to self/orientation/current situation in 5 out of 10 opportunities with Mod A cues over 3 sessions.  SLP Short Term Goal 5 (Week 3): Pt will complete basic familiar problem solving tasks related to ADLs (washing her face, brushing teeth, brushing hair) with Mod A cues.  SLP Short Term Goal 6 (Week 3): Pt will sustain attention to task for ~ 5 minutes with Mod A cues for redirection to task.   Skilled Therapeutic Interventions: Skilled treatment session focused on cognitive goals. Upon arrival, patient was asleep and required extra time and Max cues for arousal. When awakened, patient was disoriented to time of day and place and required Max A multimodal cues for recall. Patient unable to scan past midline to identify familiar family members in pictures including herself and reported, "I don't know who those people are."  Patient with intermittent language of confusion requesting snuff and asking for gum to be removed from her mouth. Patient also requested to use the bathroom but had already been incontinent of urine. Patient required Mod verbal and tactile cues to follow directions during  basic self-care tasks with bed mobility, oral care via the suction toothbrush and washing her face. Patient left upright in bed with all needs within reach and alarm on. Continue with current plan of care.      Pain No/Denies Pain   Therapy/Group: Individual Therapy  Alliya Marcon 04/26/2018, 2:23 PM

## 2018-04-26 NOTE — Progress Notes (Signed)
Speech Language Pathology Daily Session Note  Patient Details  Name: Jessica Meyer MRN: [id number] Date of Birth: 1935/04/20  Today's Date: 04/26/2018 SLP Individual Time: 1130-1200 SLP Individual Time Calculation (min): 30 min  Short Term Goals: Week 3: SLP Short Term Goal 1 (Week 3): Pt will consume trials of thin liquids via cup sips with minimal overt s/s of aspiration over 3 sessions to demonstrate readiness for diet upgrade.  SLP Short Term Goal 2 (Week 3): Pt will consume trials of dysphagia 2 with minimal overt s/s of aspiration/dysphagia over 3 sessions to demonstrate readiness for diet upgrade.  SLP Short Term Goal 3 (Week 3): Pt will follow 1 step simple directions in 5 out of 10 opportunities with Mod A cues over 3 sessions.  SLP Short Term Goal 4 (Week 3): Pt will answer basic yes/no questions related to self/orientation/current situation in 5 out of 10 opportunities with Mod A cues over 3 sessions.  SLP Short Term Goal 5 (Week 3): Pt will complete basic familiar problem solving tasks related to ADLs (washing her face, brushing teeth, brushing hair) with Mod A cues.  SLP Short Term Goal 6 (Week 3): Pt will sustain attention to task for ~ 5 minutes with Mod A cues for redirection to task.   Skilled Therapeutic Interventions:  Skilled treatment session focused on education with [name]. SLP invited Jessica Meyer to join session for clarity regarding the severity of pt's cognitive deficits. Pt demonstrates no insight, retention of information, awareness and is generally Total A for most functional cognitive tasks.      Pain    Therapy/Group: Individual Therapy  Jessica Meyer 04/26/2018, 4:06 PM

## 2018-04-26 NOTE — Progress Notes (Signed)
Social Work Patient ID: Jessica Meyer, female   DOB: 08/27/1935, 83 y.o.   MRN: [id number]   CSW met with pt to update her on team conference discussion and with pt's family via telephone.  Family is interested in talking with the team and also coming in for family education so that they can make the best decision they can for pt and her d/c disposition.  CSW will attend family conference with therapists and arrange family education with pt's dtr.

## 2018-04-26 NOTE — Procedures (Addendum)
Dysport Injection for spasticity   Dilution: 200 Units/ml Indication: Severe spasticity which interferes with ADL,mobility and/or  hygiene and is unresponsive to medication management and other conservative care Informed consent was obtained after describing risks and benefits of the procedure with the patient. This includes bleeding, bruising, infection, excessive weakness, or medication side effects.Needle:  25g 1.5 inchNumber of units per muscle Left biceps 500U Left Hamstrings 500U  Lot #[id number] EXP 10/01/2018 All injections were done after negative drawback for blood. The patient tolerated the procedure well.

## 2018-04-26 NOTE — Progress Notes (Signed)
Physical Therapy Session Note  Patient Details  Name: Keyanah Over MRN: [id number] Date of Birth: Jul 15, 1935  Today's Date: 04/26/2018 PT Individual Time: 1420-1500 PT Individual Time Calculation (min): 40 min   Short Term Goals: Week 3:  PT Short Term Goal 1 (Week 3): Patient to be able to maintain static sitting balance with Min guard on 3/5 attempts  PT Short Term Goal 2 (Week 3): Patient to be able to perform sliding board transfer with maxA of 1  PT Short Term Goal 3 (Week 3): Patient to be able to tolerate standing in standing frame for at least 15 minutes with Mod cues for postural corrections   Skilled Therapeutic Interventions/Progress Updates:    session focused on attention and sitting balance edge of bed with reaching for objects and combing hair with pt requiring max/total A for sitting balance, max cues for attention. Manual facilitation for upright trunk and to decrease pushing with Rt UE and LE.  Pt left in bed positioned on Rt side to promote gaze to the left.  Pt left with alarm set, needs at hand.  Therapy Documentation Precautions:  Precautions Precautions: Fall Precaution Comments: left inattention and right gaze, increased extensor tone LLE and Flexion tone LUE at shoulder and elbow Restrictions Weight Bearing Restrictions: No Pain:  no c/o pain   Therapy/Group: Individual Therapy  Jaqueline Uber 04/26/2018, 3:12 PM

## 2018-04-26 NOTE — Progress Notes (Signed)
Physical Therapy Session Note  Patient Details  Name: Jessica Meyer MRN: [id number] Date of Birth: Jun 17, 1935  Today's Date: 04/26/2018 PT Individual Time:1003-1045   42 min   Short Term Goals: Week 3:  PT Short Term Goal 1 (Week 3): Patient to be able to maintain static sitting balance with Min guard on 3/5 attempts  PT Short Term Goal 2 (Week 3): Patient to be able to perform sliding board transfer with maxA of 1  PT Short Term Goal 3 (Week 3): Patient to be able to tolerate standing in standing frame for at least 15 minutes with Mod cues for postural corrections   Skilled Therapeutic Interventions/Progress Updates:   Pt received sitting in WC and agreeable to PT. Pt transported to day room in TIS WC.  Sit<>stand from [facility] with max assist x 4 reps. Standing tolerance at rail in hall x 4 bouts. Max assist on first 2 bouts with max cues for placement of R shoulder on wall to force WB through the RLE. Pt able to perform weight shifting R and L with improved upright tolerance on 3rd and 4th bouts with max cues for posture, and improved midline awareness. Pt unable to attend to visual feedback of mirror.  Kinetron reciprocal movement training 3 x 1 minute with max cues and facilitation  And attention to the LLE improved sustained attention to task for >15 sec.       Therapy Documentation Precautions:  Precautions Precautions: Fall Precaution Comments: left inattention and right gaze, increased extensor tone LLE and Flexion tone LUE at shoulder and elbow Restrictions Weight Bearing Restrictions: No    Vital Signs: Therapy Vitals Temp: 98 F (36.7 C) Temp Source: Oral Pulse Rate: 69 Resp: 18 BP: 111/69 Patient Position (if appropriate): Lying Oxygen Therapy SpO2: 95 % O2 Device: Room Air Pain: Pain Assessment Pain Scale: 0-10 Pain Score: 3  Pain Type: Acute pain Pain Location: Knee Pain Orientation: Left Pain Descriptors / Indicators: Aching;Discomfort Pain Frequency:  Constant Pain Onset: On-going Patients Stated Pain Goal: 0 Pain Intervention(s): Medication (See eMAR)   Therapy/Group: Individual Therapy  Golden Pop 04/26/2018, 8:01 AM

## 2018-04-26 NOTE — Progress Notes (Signed)
[facility] PHYSICAL MEDICINE & REHABILITATION PROGRESS NOTE  Subjective/Complaints:  Patient states that "my whole body aches".  When specifically asked appears the left upper and left lower limb are sore  ROS: Patient denies CP, SOB, N/V/D    Objective: Vital Signs: Blood pressure 111/69, pulse 69, temperature 98 F (36.7 C), temperature source Oral, resp. rate 18, height 5\' 5"  (1.651 m), weight 66.2 kg, SpO2 95 %. No results found. No results for input(s): WBC, HGB, HCT, PLT in the last 72 hours. No results for input(s): NA, K, CL, CO2, GLUCOSE, BUN, CREATININE, CALCIUM in the last 72 hours.  Physical Exam: BP 111/69 (BP Location: Right Arm)   Pulse 69   Temp 98 F (36.7 C) (Oral)   Resp 18   Ht 5\' 5"  (1.651 m)   Wt 66.2 kg   SpO2 95%   BMI 24.29 kg/m  Constitutional: No distress . Vital signs reviewed. HEENT: EOMI, oral membranes moist Neck: supple Cardiovascular: RRR without murmur. No JVD    Respiratory: CTA Bilaterally without wheezes or rales. Normal effort    GI: BS +, non-tender, non-distended  Musculoskeletal: right trap tender? Neurological: She is alert Left-sided neglect   Follows commands.    Poor insight into deficits Dysarthria Motor: LLE: 0/5 proximal to distal 0/5 left upper extremity unchanged RLE: 5/5 proximal to distal Sensation absent to light touch LLE  Poor attention and concentration Increased ankle plantar flexor tone MAS 3 Left elbow flexors and finger flexors , MAS 2-3 in Left knee flexors -Tone remains elevated Skin: Skin is warm and dry.  Psychiatric: flat   Assessment/Plan: 1. Functional deficits secondary to right MCA infarct which require 3+ hours per day of interdisciplinary therapy in a comprehensive inpatient rehab setting.  Physiatrist is providing close team supervision and 24 hour management of active medical problems listed below.  Physiatrist and rehab team continue to assess barriers to discharge/monitor patient  progress toward functional and medical goals  Care Tool:  Bathing  Bathing activity did not occur: Safety/medical concerns Body parts bathed by patient: Chest, Abdomen, Face(pt did 25% of L arm)   Body parts bathed by helper: Left lower leg, Right lower leg, Left upper leg, Right upper leg, Right arm, Left arm Body parts n/a: Front perineal area, Buttocks(pt already cleansed by nursing)   Bathing assist       Upper Body Dressing/Undressing Upper body dressing   What is the patient wearing?: Pull over shirt    Upper body assist Assist Level: Maximal Assistance - Patient 25 - 49%    Lower Body Dressing/Undressing Lower body dressing    Lower body dressing activity did not occur: Safety/medical concerns What is the patient wearing?: Pants, Incontinence brief     Lower body assist Assist for lower body dressing: Total Assistance - Patient < 25%     Toileting Toileting    Toileting assist Assist for toileting: 2 Helpers     Transfers Chair/bed transfer  Transfers assist  Chair/bed transfer activity did not occur: Safety/medical concerns  Chair/bed transfer assist level: 2 Helpers     Locomotion Ambulation   Ambulation assist   Ambulation activity did not occur: Safety/medical concerns          Walk 10 feet activity   Assist  Walk 10 feet activity did not occur: Safety/medical concerns        Walk 50 feet activity   Assist Walk 50 feet with 2 turns activity did not occur: Safety/medical concerns  Walk 150 feet activity   Assist Walk 150 feet activity did not occur: Safety/medical concerns         Walk 10 feet on uneven surface  activity   Assist Walk 10 feet on uneven surfaces activity did not occur: Safety/medical concerns         Wheelchair     Assist Will patient use wheelchair at discharge?: Yes Type of Wheelchair: Manual    Wheelchair assist level: Dependent - Patient 0% Max wheelchair distance: 150 ft     Wheelchair 50 feet with 2 turns activity    Assist        Assist Level: Dependent - Patient 0%   Wheelchair 150 feet activity     Assist     Assist Level: Dependent - Patient 0%      Medical Problem List and Plan: 1.  Left side weakness with dysphagia secondary to right MCA infarction in the setting of right ICA and right MCA occlusion status post unsuccessful attempts at thrombectomy. CIR PT, OT, SLP.   Spasticity increasing, would stay away from sedating medications such as tizanidine or baclofen 2.  Antithrombotics: -DVT/anticoagulation:  SCDs             -antiplatelet therapy: aspirin 81 mg daily, Plavix 75 mg daily 3. Pain Management:  Tylenol 3 for headache as needed, flank pain has subsided, right-sided neck pain is the current complaint. Xray confirmed cervical spondylosis +/- DDD exacerbated by neck positioning from Left neglect Kpad , sportscreme Robaxin  hydrocodone-Positional challenges pt keeps head turned toward the Left 4. Mood: Wellbutrin 300 mg daily,Lexapro 5 mg daily at bedtime             -antipsychotic agents: N/A 5. Neuropsych: This patient is not fully capable of making decisions on her own behalf. 6. Skin/Wound Care:  Routine skin checks 7. Fluids/Electrolytes/Nutrition:  encourage PO 8. Dysphagia. Dysphagia #1 honey liquids.  Follow-up speech therapy             Advance diet as tolerated- per SLP 9. Hypertension. Norvasc 5 mg daily,lisinopril 40 mg daily, Coreg 12.5 mg twice a day.    Vitals:   04/25/18 1932 04/26/18 0550  BP: (!) 123/51 111/69  Pulse: 82 69  Resp: 18 18  Temp: 98.3 F (36.8 C) 98 F (36.7 C)  SpO2: 97% 95%  Controlled 4/23 10. Diabetes mellitus. Hemoglobin A1c 8.0. Glucotrol XL 2.5 mg daily, Lantus insulin 12 units daily at bedtime. Check blood sugars before meals and at bedtime.      CBG (last 3)  Recent Labs    04/25/18 1639 04/25/18 2100 04/26/18 0628  GLUCAP 106* 145* 118*  amaryl increased to 2mg  daily  4/18, controlled 4/23 11. Hyperlipidemia. Lipitor 12. Hypothyroidism. Continue Synthroid 13.  Acute blood loss anemia:              Hemoglobin 9.3 on 4/4 follow-up 10.8 on 4/13 14.  CKD stage III:             Creatinine 1.11 on 4/4. 1.22 on 4/6  stable  Will use Botox for spasticity, daughter gave verbal consent patient is unable to give [place] consent    LOS: 19 days A FACE TO FACE EVALUATION WAS PERFORMED  Erick Colace 04/26/2018, 9:40 AM

## 2018-04-26 NOTE — Patient Care Conference (Addendum)
Inpatient RehabilitationTeam Conference and Plan of Care Update Date: 04/25/2018   Time: 10:30 AM    Patient Name: Jessica Meyer      Medical Record Number: [id number]  Date of Birth: 1935-01-05 Sex: Female         Room/Bed: 4W16C/4W16C-01 Payor Info: Payor: MEDICARE / Plan: MEDICARE PART A AND B / Product Type: *No Product type* /    Admitting Diagnosis: CVA  Admit Date/Time:  04/07/2018  1:53 PM Admission Comments: No comment available   Primary Diagnosis:  <principal problem not specified> Principal Problem: <principal problem not specified>  Patient Active Problem List   Diagnosis Date Noted  . Right middle cerebral artery stroke (HCC) 04/07/2018  . Left-sided weakness   . CKD (chronic kidney disease), stage III (HCC)   . Acute blood loss anemia   . Hypothyroidism   . Dyslipidemia   . Diabetes mellitus type 2 in nonobese (HCC)   . Dysphagia, post-stroke   . Dysarthria, post-stroke   . Middle cerebral artery embolism, right 04/05/2018  . Stroke (cerebrum) (HCC) 04/04/2018  . Atypical chest pain 11/10/2017  . CKD stage G3b/A1, GFR 30-44 and albumin creatinine ratio <30 mg/g (HCC) 08/10/2016  . Left hip pain 06/14/2016  . Type 2 diabetes mellitus with stage 3 chronic kidney disease, with long-term current use of insulin (HCC) 01/29/2016  . Leukocytosis 01/25/2016  . Closed fracture of right hip (HCC)   . Fall   . Left knee pain 05/28/2015  . Sprain of hand, third finger, right 05/28/2015  . HTN (hypertension) 07/29/2014  . Gait instability 07/29/2014  . Hyperlipidemia 07/29/2014  . Right knee pain 04/11/2014  . ASNHL (asymmetrical sensorineural hearing loss) 08/07/2013  . 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 Hernia, rectovaginal 08/07/2013  . Hypercholesterolemia without hypertriglyceridemia 08/07/2013  . Arthritis of knee, degenerative 08/07/2013  . Degenerative arthritis of lumbar spine 08/07/2013  . Osteopenia 08/07/2013  . Adult hypothyroidism 08/07/2013  . History of  artificial joint 08/07/2013  . Essential (primary) hypertension 08/07/2013  . Depression with anxiety 08/07/2013    Expected Discharge Date: Expected Discharge Date: 05/05/18  Team Members Present: Physician leading conference: Dr. Claudette Laws Social Worker Present: Staci Acosta, LCSW Nurse Present: Vincente Poli, RN PT Present: Midge Minium, PT;Rosita Dechalus, PTA OT Present: Jackquline Denmark, OT SLP Present: Other (comment)(Celia Sherman, SLP; Olevia Bowens, SLP) PPS Coordinator present : Fae Pippin     Current Status/Progress Goal Weekly Team Focus  Medical   INcreasing time, pain is responding to hydrocodone     spasticity    Bowel/Bladder   incontinent x2 LBM 4/21  incontinence management, timed toileting  assess toileting needs qshift and PRN   Swallow/Nutrition/ Hydration   dysphagia 1 with honey thick liquids. MaxA for attention to left oral cavity, bolus size and rate.  Mod A - downgraded 04/18/18  Trials of dys 2, nectar thick liquids, ice chips   ADL's   Total A to +2 assist for squat pivot transfers + all self care completed bedlevel or w/c level   Downgraded to Mod A overall   postural control, NMR, cognition, functional transfers    Mobility   maxA 1-2 bed mobility, maxA to toal A stand pivot transfer, no significant change in pushing   ModA  sitting balance, L NMR, transfers, cognitive remediation   Communication             Safety/Cognition/ Behavioral Observations  ModA for basic sustained attention, comprehension, following simple directions, answering simple yes/no questions.  MaxA for left inattention  Mod A for basic cognition - downgraded 4/15  left inattention, problem solving   Pain   C/o pain to multiple sites. mostly right neck and shoulder relieved  q6 narco PRN and Q8 robaxin PRN and tylenol prn.  pain less than 3  assess pain management qshift and PRN   Skin   no skin issues     assess skin qshift and PRN    Rehab Goals Patient on target to  meet rehab goals: No Rehab Goals Revised: many goals have been downgraded *See Care Plan and progress notes for long and short-term goals.     Barriers to Discharge  Current Status/Progress Possible Resolutions Date Resolved   Physician    Medical stability     some improvement with pushing  Consider botox injection      Nursing                  PT                    OT                  SLP                SW                Discharge Planning/Teaching Needs:  Pt's dtr and extended family would like for pt to go to dtr's home at d/c where family paid caregivers will care for pt.  If pt is more care than they can provide, they will consider SNF care.   Family conference with therapists to occur 04-26-18 and then family to come in for education to determine best d/c disposition for pt.   Team Discussion:  Pt is using the call button more and not calling out into the hallway.  Pt has increasing tone in her left hamstring and left bicep and Dr. Wynn Banker is considering trying botox and will call pt's dtr to discuss.  Pt is incontinent of bowel and bladder and complains of pain in her leg. Pt is doing the same functionally this week with OT as last week.  They are not using the slide board, as squat/pivot and stand/pivot transfers are safer at max A.  Pt's attention is still poor and she's hard of hearing and this makes following commands more difficult.  Pt has not made much gain in PT.  They are using standing frame trying to keep her at midline orientation which she can olf for 15-20 seconds.  Pt needs total A for txs and balance needs max A.  Pt's left neglect is a big problem and ST is working on this, basic cognitive tasks, yes/no questions, trials of D2 diet and nectar thick liquids.  Revisions to Treatment Plan:  none    Continued Need for Acute Rehabilitation Level of Care: The patient requires daily medical management by a physician with specialized training in physical medicine and  rehabilitation for the following conditions: Daily direction of a multidisciplinary physical rehabilitation program to ensure safe treatment while eliciting the highest outcome that is of practical value to the patient.: Yes Daily medical management of patient stability for increased activity during participation in an intensive rehabilitation regime.: Yes Daily analysis of laboratory values and/or radiology reports with any subsequent need for medication adjustment of medical intervention for : Neurological problems;Other   I attest that I was present, lead the team conference, and concur with the assessment  and plan of the team.Team conference was held via web/ teleconference due to COVID - 19.   Kimiko Common, Vista DeckJennifer Capps 04/26/2018, 10:20 AM

## 2018-04-26 NOTE — Progress Notes (Signed)
Pt is currently sleeping was given pain medication earlier. Pt given a bath earlier by NT. Splint and boot on Left extremities.

## 2018-04-27 ENCOUNTER — Inpatient Hospital Stay (HOSPITAL_COMMUNITY): Payer: Medicare Other | Admitting: Physical Therapy

## 2018-04-27 ENCOUNTER — Inpatient Hospital Stay (HOSPITAL_COMMUNITY): Payer: Medicare Other | Admitting: Speech Pathology

## 2018-04-27 ENCOUNTER — Inpatient Hospital Stay (HOSPITAL_COMMUNITY): Payer: Medicare Other | Admitting: Occupational Therapy

## 2018-04-27 LAB — GLUCOSE, CAPILLARY
Glucose-Capillary: 89 mg/dL (ref 70–99)
Glucose-Capillary: 227 mg/dL — ABNORMAL HIGH (ref 70–99)
Glucose-Capillary: 220 mg/dL — ABNORMAL HIGH (ref 70–99)
Glucose-Capillary: 149 mg/dL — ABNORMAL HIGH (ref 70–99)

## 2018-04-27 NOTE — Progress Notes (Signed)
Physical Therapy Session Note  Patient Details  Name: Jessica Meyer MRN: [id number] Date of Birth: [birth year]  Today's Date: 04/27/2018 PT Individual Time: 1345-1455 PT Individual Time Calculation (min): 70 min   Short Term Goals:  Week 3:  PT Short Term Goal 1 (Week 3): Patient to be able to maintain static sitting balance with Min guard on 3/5 attempts  PT Short Term Goal 2 (Week 3): Patient to be able to perform sliding board transfer with maxA of 1  PT Short Term Goal 3 (Week 3): Patient to be able to tolerate standing in standing frame for at least 15 minutes with Mod cues for postural corrections   Skilled Therapeutic Interventions/Progress Updates:   Pt received supine in bed and agreeable to PT. Supine>sit transfer with total assist and max cues for safety and midline orientation. Squat pivot transfer to [facility] with bobath technique and total assist from PT. Pt repositioned in [place] with total assist from PT reduce pushing response to the L.   Pt transported to orthogym in [place]. Total assist squat pivot transfer to corned of mat table near wall with bobath technique. Max cues for forward and R weight shift to prevent pushers syndrome through the RLE. Sitting balance EOB with max-total assist from PT x 71mnutes . Max multimodal cues for improved attention to task, Improved positioning, use of wall to force WB through the R side of body as well as improved UE placement to prevent pushing through out stretched hand. Pt able to attain neutral position intermittently and correctly identify when out of midline, but unable to sefl correct with external cues from PT for proper direction of weight shift. [facility]  [facility] and all needs met.           Therapy Documentation Precautions:  Precautions Precautions: Fall Precaution Comments: left inattention and right gaze, increased extensor tone  LLE and Flexion tone LUE at shoulder and elbow Restrictions Weight Bearing Restrictions: No Pain: Pain Assessment Pain Scale: 0-10 Pain Score: 2     Therapy/Group: Individual Therapy  ALorie Phenix4/24/2020, 3:11 PM

## 2018-04-27 NOTE — Progress Notes (Signed)
Occupational Therapy Session Note  Patient Details  Name: Jessica Meyer MRN: [id number] Date of Birth: Dec 26, 1935  Today's Date: 04/27/2018 OT Individual Time: 7048-8891 OT Individual Time Calculation (min): 83 min    Short Term Goals: Week 3:  OT Short Term Goal 1 (Week 3): Pt will attend to L visual field to locate self care items with max cuing. OT Short Term Goal 2 (Week 3): Pt will perform UB dressing with mod A to decrease caregiver assistance.   Skilled Therapeutic Interventions/Progress Updates:    Patient asleep in bed upon arrival.  She is aware that she is in the hospital and able to follow basic directions.  She states that she wants to stay in bed.  Sponge bath completed at bed level - max a, dependent for don/doff of brief and hygiene after incontinent of urine.  LB dressing is max A/dep, UB dressing max A.  Rolling to right with mod a, rolling left CG A and cues.  SSP to and from supine max A.  Unsupported sitting with min a and ongoing tactile cues to keep right hand on right knee, blocking of right LE to limiting pushing to the left.   Sit pivot transfer bed to w/c with max A of one and stand by of 2nd person for safety.  Patient completed breakfast at w/c level with min A at times for scooping, mod A for liquids, mod cues t/o meal for left side awareness,  bite size and to swallow between bites.  Patient remained in w/c in tilted position with seat belt alarm set and call bell in reach.    Therapy Documentation Precautions:  Precautions Precautions: Fall Precaution Comments: left inattention and right gaze, increased extensor tone LLE and Flexion tone LUE at shoulder and elbow Restrictions Weight Bearing Restrictions: No General:   Vital Signs:  Pain: Pain Assessment Pain Scale: 0-10 Pain Score: 2  Faces Pain Scale: Hurts a little bit Pain Type: Acute pain Pain Location: Neck Pain Orientation: Right Pain Descriptors / Indicators: Aching Pain Frequency:  Constant Pain Onset: On-going Pain Intervention(s): Repositioned;Heat applied     Other Treatments:     Therapy/Group: Individual Therapy  Barrie Lyme 04/27/2018, 11:35 AM

## 2018-04-27 NOTE — Progress Notes (Signed)
Speech Language Pathology Weekly Progress and Session Note  Patient Details  Name: Jessica Meyer MRN: [id number] Date of Birth: 1935-08-18  Beginning of progress report period: April 20, 2018 End of progress report period: April 27, 2018  Today's Date: 04/27/2018 SLP Individual Time: 1100-1130 SLP Individual Time Calculation (min): 30 min  Short Term Goals: Week 3: SLP Short Term Goal 1 (Week 3): Pt will consume trials of thin liquids via cup sips with minimal overt s/s of aspiration over 3 sessions to demonstrate readiness for diet upgrade.  SLP Short Term Goal 1 - Progress (Week 3): Not met SLP Short Term Goal 2 (Week 3): Pt will consume trials of dysphagia 2 with minimal overt s/s of aspiration/dysphagia over 3 sessions to demonstrate readiness for diet upgrade.  SLP Short Term Goal 2 - Progress (Week 3): Not met SLP Short Term Goal 3 (Week 3): Pt will follow 1 step simple directions in 5 out of 10 opportunities with Mod A cues over 3 sessions.  SLP Short Term Goal 3 - Progress (Week 3): Not met SLP Short Term Goal 4 (Week 3): Pt will answer basic yes/no questions related to self/orientation/current situation in 5 out of 10 opportunities with Mod A cues over 3 sessions.  SLP Short Term Goal 4 - Progress (Week 3): Not met SLP Short Term Goal 5 (Week 3): Pt will complete basic familiar problem solving tasks related to ADLs (washing her face, brushing teeth, brushing hair) with Mod A cues.  SLP Short Term Goal 5 - Progress (Week 3): Not met SLP Short Term Goal 6 (Week 3): Pt will sustain attention to task for ~ 5 minutes with Mod A cues for redirection to task.  SLP Short Term Goal 6 - Progress (Week 3): Not met    New Short Term Goals: Week 4: SLP Short Term Goal 1 (Week 4): Pt will consume trials of thin liquids via cup sips with minimal overt s/s of aspiration over 3 sessions to demonstrate readiness for diet upgrade.  SLP Short Term Goal 2 (Week 4): Pt will consume trials of  dysphagia 2 with minimal overt s/s of aspiration/dysphagia over 3 sessions to demonstrate readiness for diet upgrade.  SLP Short Term Goal 3 (Week 4): Pt will follow 1 step simple directions in 5 out of 10 opportunities with Mod A cues over 3 sessions.  SLP Short Term Goal 4 (Week 4): Pt will answer basic yes/no questions related to self/orientation/current situation in 5 out of 10 opportunities with Mod A cues over 3 sessions.  SLP Short Term Goal 5 (Week 4): Pt will complete basic familiar problem solving tasks related to ADLs (washing her face, brushing teeth, brushing hair) with Mod A cues.  SLP Short Term Goal 6 (Week 4): Pt will sustain attention to task for ~ 5 minutes with Mod A cues for redirection to task.   Weekly Progress Updates: Pt continues to make minimal progress that translates in functional ability. As such she has not met her STGs and largely requires Total to Max A for all activities. Education provided to pt's family via [employer] as well an in person with her daughter Jessica Meyer. Skilled ST continues to be required to target severe cognitive deficits as well as safe diet advancement. Pt's family will require immense training to provide level of care for pt. As such education has been scheduled for next reporting period.      Intensity: Minumum of 1-2 x/day, 30 to 90 minutes Frequency: 3 to 5  out of 7 days Duration/Length of Stay: 5/2 Treatment/Interventions: Cognitive remediation/compensation;Cueing hierarchy;Internal/external aids;Therapeutic Activities;Patient/family education;Functional tasks;Dysphagia/aspiration precaution training;Environmental controls   Daily Session  Skilled Therapeutic Interventions: Skilled treatment session focused on cognition and dysphagia. SLP received pt in bed with pt asking about time of day (I.e, night vs. Day) despite being able to read clock on her bedside table. Pt also confused about having glasses on her face or having hearing aids in  place. SLP provided structure and answered questions. Pt requested coffee and SLP provided skilled observation of pt consumig via cup sips (small bolus size). Pt with immediate cough with each bolus. Per chart, pt with cough only during moment of aspiration on MBS. Trials discontinued.      General    Pain    Therapy/Group: Individual Therapy  Envi Jessica Meyer 04/27/2018, 5:21 PM

## 2018-04-27 NOTE — Progress Notes (Signed)
PHYSICAL MEDICINE & REHABILITATION PROGRESS NOTE  Subjective/Complaints:  Patient states that "my whole body aches".  When specifically asked appears the left upper and left lower limb are sore  ROS: Patient denies CP, SOB, N/V/D    Objective: Vital Signs: Blood pressure (!) 108/48, pulse 68, temperature (!) 97.5 F (36.4 C), temperature source Oral, resp. rate 17, height 5\' 5"  (1.651 m), weight 66.2 kg, SpO2 98 %. No results found. No results for input(s): WBC, HGB, HCT, PLT in the last 72 hours. No results for input(s): NA, K, CL, CO2, GLUCOSE, BUN, CREATININE, CALCIUM in the last 72 hours.  Physical Exam: BP (!) 108/48 (BP Location: Right Arm)   Pulse 68   Temp (!) 97.5 F (36.4 C) (Oral)   Resp 17   Ht 5\' 5"  (1.651 m)   Wt 66.2 kg   SpO2 98%   BMI 24.29 kg/m  Constitutional: No distress . Vital signs reviewed. HEENT: EOMI, oral membranes moist Neck: supple Cardiovascular: RRR without murmur. No JVD    Respiratory: CTA Bilaterally without wheezes or rales. Normal effort    GI: BS +, non-tender, non-distended  Musculoskeletal: right trap tender? Neurological: She is alert Left-sided neglect   Follows commands.    Poor insight into deficits Dysarthria Motor: LLE: 0/5 proximal to distal 0/5 left upper extremity unchanged RLE: 5/5 proximal to distal Sensation absent to light touch LLE  Poor attention and concentration Increased ankle plantar flexor tone MAS 3 Left elbow flexors and finger flexors , MAS 2-3 in Left knee flexors -Tone remains elevated Skin: Skin is warm and dry. 4 bruises Left biceps at injection sites, no briuses in Hamstrings area Psychiatric: flat   Assessment/Plan: 1. Functional deficits secondary to right MCA infarct which require 3+ hours per day of interdisciplinary therapy in a comprehensive inpatient rehab setting.  Physiatrist is providing close team supervision and 24 hour management of active medical problems listed  below.  Physiatrist and rehab team continue to assess barriers to discharge/monitor patient progress toward functional and medical goals  Care Tool:  Bathing  Bathing activity did not occur: Safety/medical concerns Body parts bathed by patient: Chest, Abdomen, Face(pt did 25% of L arm)   Body parts bathed by helper: Left lower leg, Right lower leg, Left upper leg, Right upper leg, Right arm, Left arm Body parts n/a: Front perineal area, Buttocks(pt already cleansed by nursing)   Bathing assist       Upper Body Dressing/Undressing Upper body dressing   What is the patient wearing?: Pull over shirt    Upper body assist Assist Level: Maximal Assistance - Patient 25 - 49%    Lower Body Dressing/Undressing Lower body dressing    Lower body dressing activity did not occur: Safety/medical concerns What is the patient wearing?: Pants, Incontinence brief     Lower body assist Assist for lower body dressing: Total Assistance - Patient < 25%     Toileting Toileting    Toileting assist Assist for toileting: 2 Helpers     Transfers Chair/bed transfer  Transfers assist  Chair/bed transfer activity did not occur: Safety/medical concerns  Chair/bed transfer assist level: 2 Helpers     Locomotion Ambulation   Ambulation assist   Ambulation activity did not occur: Safety/medical concerns          Walk 10 feet activity   Assist  Walk 10 feet activity did not occur: Safety/medical concerns        Walk 50 feet activity   Assist  Walk 50 feet with 2 turns activity did not occur: Safety/medical concerns         Walk 150 feet activity   Assist Walk 150 feet activity did not occur: Safety/medical concerns         Walk 10 feet on uneven surface  activity   Assist Walk 10 feet on uneven surfaces activity did not occur: Safety/medical concerns         Wheelchair     Assist Will patient use wheelchair at discharge?: Yes Type of Wheelchair:  Manual    Wheelchair assist level: Dependent - Patient 0% Max wheelchair distance: 150 ft    Wheelchair 50 feet with 2 turns activity    Assist        Assist Level: Dependent - Patient 0%   Wheelchair 150 feet activity     Assist     Assist Level: Dependent - Patient 0%      Medical Problem List and Plan: 1.  Left side weakness with dysphagia secondary to right MCA infarction in the setting of right ICA and right MCA occlusion status post unsuccessful attempts at thrombectomy. CIR PT, OT, SLP.   Spasticity s/p Dysport injection 500U to L bieps and to L hamstring, expect effect in 1 wk Pt does not remember getting injections yesterday PM 2.  Antithrombotics: -DVT/anticoagulation:  SCDs             -antiplatelet therapy: aspirin 81 mg daily, Plavix 75 mg daily 3. Pain Management:  Tylenol 3 for headache as needed, flank pain has subsided, right-sided neck pain is the current complaint. Xray confirmed cervical spondylosis +/- DDD exacerbated by neck positioning from Left neglect Kpad , sportscreme Robaxin  hydrocodone-Positional challenges pt keeps head turned toward the Left 4. Mood: Wellbutrin 300 mg daily,Lexapro 5 mg daily at bedtime             -antipsychotic agents: N/A 5. Neuropsych: This patient is not fully capable of making decisions on her own behalf. 6. Skin/Wound Care:  Routine skin checks 7. Fluids/Electrolytes/Nutrition:  encourage PO 8. Dysphagia. Dysphagia #1 honey liquids.  Follow-up speech therapy             Advance diet as tolerated- per SLP 9. Hypertension. Norvasc 5 mg daily,lisinopril 40 mg daily, Coreg 12.5 mg twice a day.    Vitals:   04/26/18 1424 04/27/18 0547  BP: (!) 106/57 (!) 108/48  Pulse: 61 68  Resp: 19 17  Temp: 97.7 F (36.5 C) (!) 97.5 F (36.4 C)  SpO2: 99% 98%  Controlled 4/24 10. Diabetes mellitus. Hemoglobin A1c 8.0. Glucotrol XL 2.5 mg daily, Lantus insulin 12 units daily at bedtime. Check blood sugars before meals and  at bedtime.      CBG (last 3)  Recent Labs    04/26/18 1828 04/26/18 2046 04/27/18 0621  GLUCAP 105* 121* 89  amaryl increased to 2mg  daily 4/18, controlled 4/24 11. Hyperlipidemia. Lipitor 12. Hypothyroidism. Continue Synthroid 13.  Acute blood loss anemia:              Hemoglobin 9.3 on 4/4 follow-up 10.8 on 4/13 14.  CKD stage III:             Creatinine 1.11 on 4/4. 1.22 on 4/6  stable      LOS: 20 days A FACE TO FACE EVALUATION WAS PERFORMED  Erick Colace 04/27/2018, 8:06 AM

## 2018-04-28 ENCOUNTER — Inpatient Hospital Stay (HOSPITAL_COMMUNITY): Payer: Medicare Other | Admitting: Physical Therapy

## 2018-04-28 LAB — GLUCOSE, CAPILLARY
Glucose-Capillary: 117 mg/dL — ABNORMAL HIGH (ref 70–99)
Glucose-Capillary: 212 mg/dL — ABNORMAL HIGH (ref 70–99)
Glucose-Capillary: 152 mg/dL — ABNORMAL HIGH (ref 70–99)
Glucose-Capillary: 129 mg/dL — ABNORMAL HIGH (ref 70–99)

## 2018-04-28 MED ORDER — GABAPENTIN 100 MG PO CAPS
100.0000 mg | ORAL_CAPSULE | Freq: Two times a day (BID) | ORAL | Status: DC
  Administered 2018-04-28 – 2018-04-29 (×3): 100 mg via ORAL
  Filled 2018-04-28 (×3): qty 1

## 2018-04-28 NOTE — Progress Notes (Signed)
Physical Therapy Session Note  Patient Details  Name: Jessica Meyer MRN: [id number] Date of Birth: 1935-05-04  Today's Date: 04/28/2018 PT Individual Time: 9806-9996 PT Individual Time Calculation (min): 55 min   Short Term Goals: Week 3:  PT Short Term Goal 1 (Week 3): Patient to be able to maintain static sitting balance with Min guard on 3/5 attempts  PT Short Term Goal 2 (Week 3): Patient to be able to perform sliding board transfer with maxA of 1  PT Short Term Goal 3 (Week 3): Patient to be able to tolerate standing in standing frame for at least 15 minutes with Mod cues for postural corrections   Skilled Therapeutic Interventions/Progress Updates:   Pt received supine in bed and agreeable to PT, with considerable convincing by PT. Supine>sit transfer with total assist and max cues to open eyes and improve sitting balance and midline orientation  Transfer to [facility] with total assist. Max cues for improved positioning of head for mild R lateral lean and hands in lap to prevent pushing tendencies through the LE and UE to the L. Transported to day room in [facility],   Sit<>stand and standing balance at rail in day room. Max assist for sit<>stand x 2 to force WB through LLE due to flexor tone and to weight shift to the R. External cues for improve midline orientation by keeping shoulder near the wall and visual feedback form mirror. Mild improvement in use of visual feedback to correct on this day.  Stepping the RLE to force WB through the LLE and improve pelvic positioning to prevent pushing through the RLE. Standing tolerance x 1.5 minutes and x 45 seconds at rail.   Returned to bed with total assist squat pivot transfer. To the R as listed above. Sitting balance EOB x 15 minutes with max-total assist. Lateral reached to the R to force WB through the the right side eof body and max cues for midline orientation, improved UE and LE positioning, and WB on R forearm to prevent extension  response.  Sit>supine completed with total + 2 assist and left supine in bed with call bell in reach and all needs met.         Therapy Documentation Precautions:  Precautions Precautions: Fall Precaution Comments: left inattention and right gaze, increased extensor tone LLE and Flexion tone LUE at shoulder and elbow Restrictions Weight Bearing Restrictions: No Pain: Denies at rest    Therapy/Group: Individual Therapy  Lorie Phenix 04/28/2018, 6:11 PM

## 2018-04-28 NOTE — Progress Notes (Signed)
[facility] PHYSICAL MEDICINE & REHABILITATION PROGRESS NOTE  Subjective/Complaints:  PT notes poor attn and carryover for PT tasks  ROS: Patient denies CP, SOB, N/V/D    Objective: Vital Signs: Blood pressure (!) 116/58, pulse 68, temperature 98.6 F (37 C), temperature source Oral, resp. rate 18, height 5\' 5"  (1.651 m), weight 66.2 kg, SpO2 96 %. No results found. No results for input(s): WBC, HGB, HCT, PLT in the last 72 hours. No results for input(s): NA, K, CL, CO2, GLUCOSE, BUN, CREATININE, CALCIUM in the last 72 hours.  Physical Exam: BP (!) 116/58   Pulse 68   Temp 98.6 F (37 C) (Oral)   Resp 18   Ht 5\' 5"  (1.651 m)   Wt 66.2 kg   SpO2 96%   BMI 24.29 kg/m  Constitutional: No distress . Vital signs reviewed. HEENT: EOMI, oral membranes moist Neck: supple Cardiovascular: RRR without murmur. No JVD    Respiratory: CTA Bilaterally without wheezes or rales. Normal effort    GI: BS +, non-tender, non-distended  Musculoskeletal: right trap tender? Neurological: She is alert Left-sided neglect   Follows commands.    Poor insight into deficits Dysarthria Motor: LLE: 0/5 proximal to distal 0/5 left upper extremity unchanged RLE: 5/5 proximal to distal Sensation absent to light touch LLE  Poor attention and concentration Increased ankle plantar flexor tone MAS 3 Left elbow flexors and finger flexors , MAS 2-3 in Left knee flexors -Tone remains elevated Skin: Skin is warm and dry. 4 bruises Left biceps at injection sites, no briuses in Hamstrings area Psychiatric: flat   Assessment/Plan: 1. Functional deficits secondary to right MCA infarct which require 3+ hours per day of interdisciplinary therapy in a comprehensive inpatient rehab setting.  Physiatrist is providing close team supervision and 24 hour management of active medical problems listed below.  Physiatrist and rehab team continue to assess barriers to discharge/monitor patient progress toward  functional and medical goals  Care Tool:  Bathing  Bathing activity did not occur: Safety/medical concerns Body parts bathed by patient: Face, Right upper leg   Body parts bathed by helper: Right arm, Left arm, Chest, Abdomen, Front perineal area, Buttocks, Left upper leg, Right lower leg, Left lower leg Body parts n/a: Front perineal area, Buttocks(pt already cleansed by nursing)   Bathing assist Assist Level: Total Assistance - Patient < 25%     Upper Body Dressing/Undressing Upper body dressing   What is the patient wearing?: Pull over shirt    Upper body assist Assist Level: Maximal Assistance - Patient 25 - 49%    Lower Body Dressing/Undressing Lower body dressing    Lower body dressing activity did not occur: Safety/medical concerns What is the patient wearing?: Pants, Incontinence brief     Lower body assist Assist for lower body dressing: Total Assistance - Patient < 25%     Toileting Toileting    Toileting assist Assist for toileting: 2 Helpers     Transfers Chair/bed transfer  Transfers assist  Chair/bed transfer activity did not occur: Safety/medical concerns  Chair/bed transfer assist level: 2 Helpers     Locomotion Ambulation   Ambulation assist   Ambulation activity did not occur: Safety/medical concerns          Walk 10 feet activity   Assist  Walk 10 feet activity did not occur: Safety/medical concerns        Walk 50 feet activity   Assist Walk 50 feet with 2 turns activity did not occur: Safety/medical concerns  Walk 150 feet activity   Assist Walk 150 feet activity did not occur: Safety/medical concerns         Walk 10 feet on uneven surface  activity   Assist Walk 10 feet on uneven surfaces activity did not occur: Safety/medical concerns         Wheelchair     Assist Will patient use wheelchair at discharge?: Yes Type of Wheelchair: Manual    Wheelchair assist level: Dependent - Patient  0% Max wheelchair distance: 150 ft    Wheelchair 50 feet with 2 turns activity    Assist        Assist Level: Dependent - Patient 0%   Wheelchair 150 feet activity     Assist     Assist Level: Dependent - Patient 0%      Medical Problem List and Plan: 1.  Left side weakness with dysphagia secondary to right MCA infarction in the setting of right ICA and right MCA occlusion status post unsuccessful attempts at thrombectomy. CIR PT, OT, SLP.   Spasticity s/p Dysport injection 500U to L bieps and to L hamstring, expect effect ~4/30  2.  Antithrombotics: -DVT/anticoagulation:  SCDs             -antiplatelet therapy: aspirin 81 mg daily, Plavix 75 mg daily 3. Pain Management:  Tylenol 3 for headache as needed, flank pain has subsided, right-sided neck pain is the current complaint. Xray confirmed cervical spondylosis +/- DDD exacerbated by neck positioning from Left neglect Kpad , sportscreme Robaxin  hydrocodone-Positional challenges pt keeps head turned toward the Left 4. Mood: Wellbutrin 300 mg daily,Lexapro 5 mg daily at bedtime, poor attention             -antipsychotic agents: N/A 5. Neuropsych: This patient is not fully capable of making decisions on her own behalf. 6. Skin/Wound Care:  Routine skin checks 7. Fluids/Electrolytes/Nutrition:  encourage PO 8. Dysphagia. Dysphagia #1 honey liquids.  Follow-up speech therapy             Advance diet as tolerated- per SLP 9. Hypertension. Norvasc 5 mg daily,lisinopril 40 mg daily, Coreg 12.5 mg twice a day.    Vitals:   04/27/18 1927 04/28/18 0541  BP: 121/62 (!) 116/58  Pulse: 78 68  Resp: 16 18  Temp: 98.7 F (37.1 C) 98.6 F (37 C)  SpO2: 98% 96%  Controlled 4/25 10. Diabetes mellitus. Hemoglobin A1c 8.0. Glucotrol XL 2.5 mg daily, Lantus insulin 12 units daily at bedtime. Check blood sugars before meals and at bedtime.      CBG (last 3)  Recent Labs    04/27/18 1655 04/27/18 2107 04/28/18 0633  GLUCAP  220* 149* 117*  amaryl increased to 2mg  daily 4/18, controlled 4/25 11. Hyperlipidemia. Lipitor 12. Hypothyroidism. Continue Synthroid 13.  Acute blood loss anemia:              Hemoglobin 9.3 on 4/4 follow-up 10.8 on 4/13 14.  CKD stage III:             Creatinine 1.11 on 4/4. 1.22 on 4/6  stable      LOS: 21 days A FACE TO FACE EVALUATION WAS PERFORMED  Erick Colace 04/28/2018, 8:58 AM

## 2018-04-29 LAB — GLUCOSE, CAPILLARY
Glucose-Capillary: 109 mg/dL — ABNORMAL HIGH (ref 70–99)
Glucose-Capillary: 228 mg/dL — ABNORMAL HIGH (ref 70–99)
Glucose-Capillary: 168 mg/dL — ABNORMAL HIGH (ref 70–99)
Glucose-Capillary: 131 mg/dL — ABNORMAL HIGH (ref 70–99)

## 2018-04-29 MED ORDER — SENNOSIDES-DOCUSATE SODIUM 8.6-50 MG PO TABS
1.0000 | ORAL_TABLET | Freq: Every day | ORAL | Status: DC
  Administered 2018-04-29 – 2018-05-04 (×6): 1 via ORAL
  Filled 2018-04-29 (×6): qty 1

## 2018-04-29 MED ORDER — GABAPENTIN 100 MG PO CAPS
100.0000 mg | ORAL_CAPSULE | Freq: Three times a day (TID) | ORAL | Status: DC
  Administered 2018-04-29 – 2018-04-30 (×3): 100 mg via ORAL
  Filled 2018-04-29 (×3): qty 1

## 2018-04-29 NOTE — Progress Notes (Addendum)
[place] PHYSICAL MEDICINE & REHABILITATION PROGRESS NOTE  Subjective/Complaints:  Still req assist for sitting balance, ate ~100% breakfast C/o pain on entire left side  ROS: Patient denies CP, SOB, N/V/D    Objective: Vital Signs: Blood pressure (!) 135/57, pulse 65, temperature 98.1 F (36.7 C), temperature source Oral, resp. rate 16, height 5\' 5"  (1.651 m), weight 66.2 kg, SpO2 95 %. No results found. No results for input(s): WBC, HGB, HCT, PLT in the last 72 hours. No results for input(s): NA, K, CL, CO2, GLUCOSE, BUN, CREATININE, CALCIUM in the last 72 hours.  Physical Exam: BP (!) 135/57 (BP Location: Right Arm)   Pulse 65   Temp 98.1 F (36.7 C) (Oral)   Resp 16   Ht 5\' 5"  (1.651 m)   Wt 66.2 kg   SpO2 95%   BMI 24.29 kg/m  Constitutional: No distress . Vital signs reviewed. HEENT: EOMI, oral membranes moist Neck: supple Cardiovascular: RRR without murmur. No JVD    Respiratory: CTA Bilaterally without wheezes or rales. Normal effort    GI: BS +, non-tender, non-distended  Musculoskeletal: right trap tender? Neurological: She is alert Left-sided neglect   Follows commands.    Poor insight into deficits Dysarthria Motor: LLE: 0/5 proximal to distal 0/5 left upper extremity unchanged RLE: 5/5 proximal to distal Sensation absent to light touch LLE  Poor attention and concentration Increased ankle plantar flexor tone MAS 3 Left elbow flexors and finger flexors , MAS 2-3 in Left knee flexors -Tone remains elevated Skin: Skin is warm and dry. 4 bruises Left biceps at injection sites, no briuses in Hamstrings area Psychiatric: flat   Assessment/Plan: 1. Functional deficits secondary to right MCA infarct which require 3+ hours per day of interdisciplinary therapy in a comprehensive inpatient rehab setting.  Physiatrist is providing close team supervision and 24 hour management of active medical problems listed below.  Physiatrist and rehab team continue to  assess barriers to discharge/monitor patient progress toward functional and medical goals  Care Tool:  Bathing  Bathing activity did not occur: Safety/medical concerns Body parts bathed by patient: Face, Right upper leg   Body parts bathed by helper: Right arm, Left arm, Chest, Abdomen, Front perineal area, Buttocks, Left upper leg, Right lower leg, Left lower leg Body parts n/a: Front perineal area, Buttocks(pt already cleansed by nursing)   Bathing assist Assist Level: Total Assistance - Patient < 25%     Upper Body Dressing/Undressing Upper body dressing   What is the patient wearing?: Pull over shirt    Upper body assist Assist Level: Maximal Assistance - Patient 25 - 49%    Lower Body Dressing/Undressing Lower body dressing    Lower body dressing activity did not occur: Safety/medical concerns What is the patient wearing?: Pants, Incontinence brief     Lower body assist Assist for lower body dressing: Total Assistance - Patient < 25%     Toileting Toileting    Toileting assist Assist for toileting: 2 Helpers     Transfers Chair/bed transfer  Transfers assist  Chair/bed transfer activity did not occur: Safety/medical concerns  Chair/bed transfer assist level: 2 Helpers     Locomotion Ambulation   Ambulation assist   Ambulation activity did not occur: Safety/medical concerns          Walk 10 feet activity   Assist  Walk 10 feet activity did not occur: Safety/medical concerns        Walk 50 feet activity   Assist Walk 50 feet  with 2 turns activity did not occur: Safety/medical concerns         Walk 150 feet activity   Assist Walk 150 feet activity did not occur: Safety/medical concerns         Walk 10 feet on uneven surface  activity   Assist Walk 10 feet on uneven surfaces activity did not occur: Safety/medical concerns         Wheelchair     Assist Will patient use wheelchair at discharge?: Yes Type of Wheelchair:  Manual    Wheelchair assist level: Dependent - Patient 0% Max wheelchair distance: 150 ft    Wheelchair 50 feet with 2 turns activity    Assist        Assist Level: Dependent - Patient 0%   Wheelchair 150 feet activity     Assist     Assist Level: Dependent - Patient 0%      Medical Problem List and Plan: 1.  Left side weakness with dysphagia secondary to right MCA infarction in the setting of right ICA and right MCA occlusion status post unsuccessful attempts at thrombectomy. CIR PT, OT, SLP.   Spasticity s/p Dysport injection 500U to L bieps and to L hamstring, expect effect ~4/30  2.  Antithrombotics: -DVT/anticoagulation:  SCDs             -antiplatelet therapy: aspirin 81 mg daily, Plavix 75 mg daily 3. Pain Management:  Tylenol 3 for headache as needed, flank pain has subsided, right-sided neck pain is the current complaint. Xray confirmed cervical spondylosis +/- DDD exacerbated by neck positioning from Left neglect Kpad , sportscreme Robaxin  hydrocodone-Positional challenges pt keeps head turned toward the Left Neurogenic pain stroke, gabapentin 100mg  TID 4. Mood: Wellbutrin 300 mg daily,Lexapro 5 mg daily at bedtime, poor attention             -antipsychotic agents: N/A 5. Neuropsych: This patient is not fully capable of making decisions on her own behalf. 6. Skin/Wound Care:  Routine skin checks 7. Fluids/Electrolytes/Nutrition:  encourage PO, much improved I 840ml 8. Dysphagia. Dysphagia #1 honey liquids.  Follow-up speech therapy             Advance diet as tolerated- per SLP 9. Hypertension. Norvasc 5 mg daily,lisinopril 40 mg daily, Coreg 12.5 mg twice a day.    Vitals:   04/28/18 2000 04/29/18 0547  BP: 133/70 (!) 135/57  Pulse: 70 65  Resp: 18 16  Temp: 98.3 F (36.8 C) 98.1 F (36.7 C)  SpO2: 97% 95%  Controlled 4/26 10. Diabetes mellitus. Hemoglobin A1c 8.0. Glucotrol XL 2.5 mg daily, Lantus insulin 12 units daily at bedtime. Check blood  sugars before meals and at bedtime.      CBG (last 3)  Recent Labs    04/28/18 1727 04/28/18 2111 04/29/18 0636  GLUCAP 152* 129* 109*  amaryl increased to 2mg  daily 4/18, controlled 4/26 11. Hyperlipidemia. Lipitor 12. Hypothyroidism. Continue Synthroid 13.  Acute blood loss anemia:              Hemoglobin 9.3 on 4/4 follow-up 10.8 on 4/13 14.  CKD stage III:             Creatinine 1.11 on 4/4. 1.22 on 4/6  stable      LOS: 22 days A FACE TO FACE EVALUATION WAS PERFORMED  Erick Colacendrew E Kirsteins 04/29/2018, 8:15 AM

## 2018-04-30 ENCOUNTER — Inpatient Hospital Stay (HOSPITAL_COMMUNITY): Payer: Medicare Other | Admitting: Speech Pathology

## 2018-04-30 ENCOUNTER — Inpatient Hospital Stay (HOSPITAL_COMMUNITY): Payer: Medicare Other | Admitting: Occupational Therapy

## 2018-04-30 ENCOUNTER — Inpatient Hospital Stay (HOSPITAL_COMMUNITY): Payer: Medicare Other | Admitting: Physical Therapy

## 2018-04-30 LAB — GLUCOSE, CAPILLARY
Glucose-Capillary: 111 mg/dL — ABNORMAL HIGH (ref 70–99)
Glucose-Capillary: 208 mg/dL — ABNORMAL HIGH (ref 70–99)
Glucose-Capillary: 158 mg/dL — ABNORMAL HIGH (ref 70–99)
Glucose-Capillary: 204 mg/dL — ABNORMAL HIGH (ref 70–99)

## 2018-04-30 MED ORDER — GABAPENTIN 100 MG PO CAPS
200.0000 mg | ORAL_CAPSULE | Freq: Three times a day (TID) | ORAL | Status: DC
  Administered 2018-04-30 – 2018-05-01 (×3): 200 mg via ORAL
  Filled 2018-04-30 (×3): qty 2

## 2018-04-30 NOTE — Progress Notes (Signed)
Occupational Therapy Session Note  Patient Details  Name: Jessica Meyer MRN: [id number] Date of Birth: 1935-08-03  Today's Date: 04/30/2018 OT Individual Time: 0900-1008 OT Individual Time Calculation (min): 68 min    Short Term Goals: Week 3:  OT Short Term Goal 1 (Week 3): Pt will attend to L visual field to locate self care items with max cuing. OT Short Term Goal 2 (Week 3): Pt will perform UB dressing with mod A to decrease caregiver assistance.   Skilled Therapeutic Interventions/Progress Updates:    Upon entering the room, pt supine in bed with c/o entire L side pain. Physician present in the room doing assessment at this time. Pt requesting water and drinking from spoon without coughing noted but liquids being held until pt stopped talking for safety. Pt rolling L <> R with max A for therapist to provide total A to don LB clothing. Supine >sit with total A this session and pt with extreme pushing today requiring total A of 2 for safety on EOB. +2 for safety with bobath transfer from bed >wheelchair. Pt needing max cuing for hemiplegic dressing technique for UB and total A for anterior weight shift to pull shirt down trunk. Pt then verbalizing, " I need the bed pan." Pt returning to bed in same manner as above and placed on bed pan with +2 for safety. Pt having small BM and required total A for hygiene and clothing management. Pt remained supine in bed with B LEs elevated and UB flat in bed with pt staying more in midline vs leaning to the L. Bed alarm activated and call bell within reach.   Therapy Documentation Precautions:  Precautions Precautions: Fall Precaution Comments: left inattention and right gaze, increased extensor tone LLE and Flexion tone LUE at shoulder and elbow Restrictions Weight Bearing Restrictions: No    ADL: ADL Eating: Maximal assistance Where Assessed-Eating: Bed level Grooming: Moderate assistance Where Assessed-Grooming: Bed level Upper Body Bathing:  Maximal assistance Where Assessed-Upper Body Bathing: Bed level Lower Body Bathing: Maximal assistance Where Assessed-Lower Body Bathing: Bed level Upper Body Dressing: Maximal assistance Where Assessed-Upper Body Dressing: Bed level Lower Body Dressing: Dependent Where Assessed-Lower Body Dressing: Bed level Toileting: Dependent Where Assessed-Toileting: Bed level Toilet Transfer: Unable to assess   Therapy/Group: Individual Therapy  Alen Bleacher 04/30/2018, 10:14 AM

## 2018-04-30 NOTE — Progress Notes (Signed)
Speech Language Pathology Daily Session Note  Patient Details  Name: Khiyah Maben MRN: [id number] Date of Birth: November 20, 1935  Today's Date: 04/30/2018 SLP Individual Time: 0815-0900 SLP Individual Time Calculation (min): 45 min  Short Term Goals: Week 4: SLP Short Term Goal 1 (Week 4): Pt will consume trials of thin liquids via cup sips with minimal overt s/s of aspiration over 3 sessions to demonstrate readiness for diet upgrade.  SLP Short Term Goal 2 (Week 4): Pt will consume trials of dysphagia 2 with minimal overt s/s of aspiration/dysphagia over 3 sessions to demonstrate readiness for diet upgrade.  SLP Short Term Goal 3 (Week 4): Pt will follow 1 step simple directions in 5 out of 10 opportunities with Mod A cues over 3 sessions.  SLP Short Term Goal 4 (Week 4): Pt will answer basic yes/no questions related to self/orientation/current situation in 5 out of 10 opportunities with Mod A cues over 3 sessions.  SLP Short Term Goal 5 (Week 4): Pt will complete basic familiar problem solving tasks related to ADLs (washing her face, brushing teeth, brushing hair) with Mod A cues.  SLP Short Term Goal 6 (Week 4): Pt will sustain attention to task for ~ 5 minutes with Mod A cues for redirection to task.   Skilled Therapeutic Interventions:  Skilled treatment session focused on cognition and dysphagia goals. SLP facilitated session by providing skilled observation of pt consuming dysphagia 1 breakfast with trials of nectar thick liquids. Pt continues with massive bolus size when consuming nectar thick liquids via cup with copious amounts of left anterior spillage. Pt resistant to cues for smaller size. She also required continued redirection to task d/t continuous attempts/questions regarding items previously removed from tray table. To decrease distractions, SLP removed all items from tray table except for plate and cup. Pt perseverative on consuming coffee etc and despite explanation, pt unable to  retain information and cease perseveration. Pt required help locating food items that were slightly to left of midline d/t inability to follow directions to rotate plate or scan plate. Additionally, SLP attempted to put pt's hearing aid in left ear. Pt with general confusion regarding hearing aids. For example she kept turning her head to the left to look at SLP therefore preventing SLP access to ear. Pt left upright in bed, bed alarm on and all needs within reach. Continue per current plan of care.      Pain Pain Assessment Pain Scale: 0-10 Pain Score: 4   Therapy/Group: Individual Therapy  Candyce Gambino 04/30/2018, 12:47 PM

## 2018-04-30 NOTE — Progress Notes (Signed)
Physical Therapy Session Note  Patient Details  Name: Jessica Meyer MRN: [id number] Date of Birth: 1935-09-20  Today's Date: 04/30/2018 PT Individual Time: 1335-1445 PT Individual Time Calculation (min): 70 min   Short Term Goals: Week 3:  PT Short Term Goal 1 (Week 3): Patient to be able to maintain static sitting balance with Min guard on 3/5 attempts  PT Short Term Goal 2 (Week 3): Patient to be able to perform sliding board transfer with maxA of 1  PT Short Term Goal 3 (Week 3): Patient to be able to tolerate standing in standing frame for at least 15 minutes with Mod cues for postural corrections   Skilled Therapeutic Interventions/Progress Updates: Pt presented in bed agreeable to therapy, indicating possible incontinence. Pt performed rolling to L maxA with PTA confirming incontinent BM. Pt performed rolling maxA to L total A to R and total A for peri-care and clothing management. Pt was able to perform  bridge with PTA blocking LLE to allow +2 to pull pants over hips. Pt performed supine to sit with total A and noted to have heavy posterior pushing this session. Performed lateral scoot total A to L to TIS with pt pushing from EOB causing TIS chair to move and requiring dependent +2 assist for completion to chair. Pt then transported to ortho gym and participated in sitting balance activities reaching to R. PTA used mirror in front to pt for visual feedback, pt was repositioned with maxA to midline and pt was able to maintain with minA x 30 sec and max verbal cues. Pt performed reaching activities to R and forward with max cues. Pt was able to return to midline with minA. Pt participated in STS at wall rail total A x 2 with PTA advising not use RUE to decrease pushing. Pt was able to shift wt to R side briefly with PTA and tech blocking B knees. Performed x 2 with max standing time up to 30 sec. Pt also participated in scanning activities to L/R finding numbers in hallway. Pt was able to  correctly name sequence however required max multimodal cues for reaching for numbers. Pt transported back to room and performed squat pivot transfer total A x 2 to bed. Pt positioned in bed and left with bed alarm on, call bell within reach and needs met.       Therapy Documentation Precautions:  Precautions Precautions: Fall Precaution Comments: left inattention and right gaze, increased extensor tone LLE and Flexion tone LUE at shoulder and elbow Restrictions Weight Bearing Restrictions: No General:   Vital Signs: Therapy Vitals Temp: 98.4 F (36.9 C) Temp Source: Oral Pulse Rate: 71 Resp: 18 BP: 129/64 Patient Position (if appropriate): Lying Oxygen Therapy SpO2: 97 % O2 Device: Room Air Pain: Pain Assessment Pain Scale: 0-10 Pain Score: 4     Therapy/Group: Individual Therapy  Naliah Eddington  Jachin Coury, PTA  04/30/2018, 4:26 PM

## 2018-04-30 NOTE — Progress Notes (Signed)
Social Work Patient ID: Jessica Meyer, female   DOB: 05-13-1935, 83 y.o.   MRN: [id number]   Patient/Family Conference via Zoom video conference 04-26-18  Patient/family in attendance:  Felicita Gage - pt's dtr; Shari Prows - pt's son; Rollen Sox - pt's son-in-law; Magda Paganini and Estill Dooms - pt's grandchildren  Staff in attendance:  Reuel Derby, SLP; Jackquline Denmark, OT; Grier Rocher, PT; Staci Acosta, LCSW  Main focus:  Update on pt's functional status  Synopsis of information shared:  Each therapist had time to share where pt is in her progress on Rehab.  Happi started the conversation by explaining how impaired pt's cognition is and that this impacts everything she does.  Pt's inattention to tasks and carryover from session to session make progress challenging.  She also discussed pt's diet.  Katie discussed that pt is on night baths with nursing so that she can work on other ADLs.  She reports that pt needs max to total A for dressing.  She also talked about pt's midline and how she thinks she is at midline when she's leaning to the left, so that is why she feels fearful when therapists try to get her to midline.    Eliberto Ivory talked about midline, as well, and talked about positioning her so that she feels more centered and will not have to lean or push as much.  Pt is max to total A with PT, sometimes +2.  Eliberto Ivory also talked about necessary DME pt will need at home.  Boneta Lucks talked once the therapists left the room about discharge needs and encouraged family to come for family education to see pt and learn her care needs at home.  Also reminded them to get their paid caregivers in place knowing that pt's d/c date is scheduled for 05-05-18.  CSW also stated that if the care needs where too great, that we could look into SNF tx, as needed.  Barriers/concerns expressed by patient and family:  Family asked about her cognition, orientation, diet. They asked about things they can do to support her from a  distance.  They are starting to think about modifications to the home that would be helpful in the care of pt.  Patient/family response:  Pt's family was very appreciative of family conference and they are wanting to do the best thing for the patient after CIR care.    Follow-up/action plans:  Family education with [name] and Brett Canales on 05-01-18 and then again on 05-03-18.  CSW to order DME and arrange HH.  Family to work on getting paid caregivers into place and getting the home ready.

## 2018-04-30 NOTE — Progress Notes (Signed)
[place] PHYSICAL MEDICINE & REHABILITATION PROGRESS NOTE  Subjective/Complaints:  No issues overnight, C/o LUE and LLE  ROS: Patient denies CP, SOB, N/V/D    Objective: Vital Signs: Blood pressure (!) 127/59, pulse 61, temperature (!) 97.3 F (36.3 C), temperature source Oral, resp. rate 16, height 5\' 5"  (1.651 m), weight 66.2 kg, SpO2 92 %. No results found. No results for input(s): WBC, HGB, HCT, PLT in the last 72 hours. No results for input(s): NA, K, CL, CO2, GLUCOSE, BUN, CREATININE, CALCIUM in the last 72 hours.  Physical Exam: BP (!) 127/59 (BP Location: Right Arm)   Pulse 61   Temp (!) 97.3 F (36.3 C) (Oral)   Resp 16   Ht 5\' 5"  (1.651 m)   Wt 66.2 kg   SpO2 92%   BMI 24.29 kg/m  Constitutional: No distress . Vital signs reviewed. HEENT: EOMI, oral membranes moist Neck: supple Cardiovascular: RRR without murmur. No JVD    Respiratory: CTA Bilaterally without wheezes or rales. Normal effort    GI: BS +, non-tender, non-distended  Musculoskeletal: right trap tender? Neurological: She is alert Left-sided neglect   Follows commands.    Poor insight into deficits Dysarthria Motor: LLE: 0/5 proximal to distal 0/5 left upper extremity unchanged RLE: 5/5 proximal to distal Sensation absent to light touch LLE  Poor attention and concentration Increased ankle plantar flexor tone MAS 3 Left elbow flexors and finger flexors , MAS 2-3 in Left knee flexors -Tone remains elevated Skin: Skin is warm and dry. 4 bruises Left biceps at injection sites, no briuses in Hamstrings area Psychiatric: flat   Assessment/Plan: 1. Functional deficits secondary to right MCA infarct which require 3+ hours per day of interdisciplinary therapy in a comprehensive inpatient rehab setting.  Physiatrist is providing close team supervision and 24 hour management of active medical problems listed below.  Physiatrist and rehab team continue to assess barriers to discharge/monitor  patient progress toward functional and medical goals  Care Tool:  Bathing  Bathing activity did not occur: Safety/medical concerns Body parts bathed by patient: Face, Right upper leg   Body parts bathed by helper: Right arm, Left arm, Chest, Abdomen, Front perineal area, Buttocks, Left upper leg, Right lower leg, Left lower leg Body parts n/a: Front perineal area, Buttocks(pt already cleansed by nursing)   Bathing assist Assist Level: Total Assistance - Patient < 25%     Upper Body Dressing/Undressing Upper body dressing   What is the patient wearing?: Pull over shirt    Upper body assist Assist Level: Maximal Assistance - Patient 25 - 49%    Lower Body Dressing/Undressing Lower body dressing    Lower body dressing activity did not occur: Safety/medical concerns What is the patient wearing?: Pants, Incontinence brief     Lower body assist Assist for lower body dressing: Total Assistance - Patient < 25%     Toileting Toileting    Toileting assist Assist for toileting: 2 Helpers     Transfers Chair/bed transfer  Transfers assist  Chair/bed transfer activity did not occur: Safety/medical concerns  Chair/bed transfer assist level: 2 Helpers     Locomotion Ambulation   Ambulation assist   Ambulation activity did not occur: Safety/medical concerns          Walk 10 feet activity   Assist  Walk 10 feet activity did not occur: Safety/medical concerns        Walk 50 feet activity   Assist Walk 50 feet with 2 turns activity did not  occur: Safety/medical concerns         Walk 150 feet activity   Assist Walk 150 feet activity did not occur: Safety/medical concerns         Walk 10 feet on uneven surface  activity   Assist Walk 10 feet on uneven surfaces activity did not occur: Safety/medical concerns         Wheelchair     Assist Will patient use wheelchair at discharge?: Yes Type of Wheelchair: Manual    Wheelchair assist  level: Dependent - Patient 0% Max wheelchair distance: 150 ft    Wheelchair 50 feet with 2 turns activity    Assist        Assist Level: Dependent - Patient 0%   Wheelchair 150 feet activity     Assist     Assist Level: Dependent - Patient 0%      Medical Problem List and Plan: 1.  Left side weakness with dysphagia secondary to right MCA infarction in the setting of right ICA and right MCA occlusion status post unsuccessful attempts at thrombectomy. CIR PT, OT, SLP.   Spasticity s/p Dysport injection 500U to L bieps and to L hamstring, expect effect ~4/30 Plan discharge this week after family training 2.  Antithrombotics: -DVT/anticoagulation:  SCDs             -antiplatelet therapy: aspirin 81 mg daily, Plavix 75 mg daily 3. Pain Management:  Tylenol 3 for headache as needed, flank pain has subsided, right-sided neck pain is the current complaint. Xray confirmed cervical spondylosis +/- DDD exacerbated by neck positioning from Left neglect Kpad , sportscreme Robaxin  hydrocodone-Positional challenges pt keeps head turned toward the Left Neurogenic pain stroke, no improvement will increase gabapentin to 200mg  TID 4. Mood: Wellbutrin 300 mg daily,Lexapro 5 mg daily at bedtime, poor attention             -antipsychotic agents: N/A 5. Neuropsych: This patient is not fully capable of making decisions on her own behalf. 6. Skin/Wound Care:  Routine skin checks 7. Fluids/Electrolytes/Nutrition:  encourage PO, much improved I 840ml 8. Dysphagia. Dysphagia #1 honey liquids.  Follow-up speech therapy             Advance diet as tolerated- per SLP 9. Hypertension. Norvasc 5 mg daily,lisinopril 40 mg daily, Coreg 12.5 mg twice a day.    Vitals:   04/29/18 1948 04/30/18 0459  BP: 114/61 (!) 127/59  Pulse: 65 61  Resp: 16 16  Temp: 97.7 F (36.5 C) (!) 97.3 F (36.3 C)  SpO2: 95% 92%  Controlled 4/27 10. Diabetes mellitus. Hemoglobin A1c 8.0. Glucotrol XL 2.5 mg daily,  Lantus insulin 12 units daily at bedtime. Check blood sugars before meals and at bedtime.      CBG (last 3)  Recent Labs    04/29/18 1732 04/29/18 2056 04/30/18 0616  GLUCAP 168* 131* 111*  amaryl increased to 2mg  daily 4/18, controlled 4/27 11. Hyperlipidemia. Lipitor 12. Hypothyroidism. Continue Synthroid 13.  Acute blood loss anemia:              Hemoglobin 9.3 on 4/4 follow-up 10.8 on 4/13 14.  CKD stage III:             Creatinine 1.11 on 4/4. 1.22 on 4/6  stable      LOS: 23 days A FACE TO FACE EVALUATION WAS PERFORMED  Erick Colacendrew E Ali Mohl 04/30/2018, 9:05 AM

## 2018-05-01 ENCOUNTER — Encounter (HOSPITAL_COMMUNITY): Payer: Medicare Other | Admitting: Occupational Therapy

## 2018-05-01 ENCOUNTER — Ambulatory Visit (HOSPITAL_COMMUNITY): Payer: Medicare Other | Admitting: Physical Therapy

## 2018-05-01 ENCOUNTER — Inpatient Hospital Stay (HOSPITAL_COMMUNITY): Payer: Medicare Other | Admitting: Occupational Therapy

## 2018-05-01 ENCOUNTER — Encounter (HOSPITAL_COMMUNITY): Payer: Medicare Other | Admitting: Speech Pathology

## 2018-05-01 DIAGNOSIS — M542 Cervicalgia: Secondary | ICD-10-CM

## 2018-05-01 LAB — GLUCOSE, CAPILLARY
Glucose-Capillary: 125 mg/dL — ABNORMAL HIGH (ref 70–99)
Glucose-Capillary: 276 mg/dL — ABNORMAL HIGH (ref 70–99)
Glucose-Capillary: 82 mg/dL (ref 70–99)
Glucose-Capillary: 190 mg/dL — ABNORMAL HIGH (ref 70–99)

## 2018-05-01 MED ORDER — GABAPENTIN 300 MG PO CAPS
300.0000 mg | ORAL_CAPSULE | Freq: Three times a day (TID) | ORAL | Status: DC
  Administered 2018-05-01 – 2018-05-02 (×3): 300 mg via ORAL
  Filled 2018-05-01 (×3): qty 1

## 2018-05-01 NOTE — Progress Notes (Signed)
Speech Language Pathology Daily Session Note  Patient Details  Name: Jessica Meyer MRN: [id number] Date of Birth: 1935/06/09  Today's Date: 05/01/2018 SLP Individual Time: 1300-1400 SLP Individual Time Calculation (min): 60 min  Short Term Goals: Week 4: SLP Short Term Goal 1 (Week 4): Pt will consume trials of thin liquids via cup sips with minimal overt s/s of aspiration over 3 sessions to demonstrate readiness for diet upgrade.  SLP Short Term Goal 2 (Week 4): Pt will consume trials of dysphagia 2 with minimal overt s/s of aspiration/dysphagia over 3 sessions to demonstrate readiness for diet upgrade.  SLP Short Term Goal 3 (Week 4): Pt will follow 1 step simple directions in 5 out of 10 opportunities with Mod A cues over 3 sessions.  SLP Short Term Goal 4 (Week 4): Pt will answer basic yes/no questions related to self/orientation/current situation in 5 out of 10 opportunities with Mod A cues over 3 sessions.  SLP Short Term Goal 5 (Week 4): Pt will complete basic familiar problem solving tasks related to ADLs (washing her face, brushing teeth, brushing hair) with Mod A cues.  SLP Short Term Goal 6 (Week 4): Pt will sustain attention to task for ~ 5 minutes with Mod A cues for redirection to task.   Skilled Therapeutic Interventions:  Skilled treatment session focused on dysphagia, cognition and education with pt's son and daughter. SLP facilitated session by requesting dysphagia 1 and dysphagia 2 meal trays for comparison in textures and in pt's ability. When consuming dysphagia 2 pt with significant increase in oral residue to  moderate amount and when consuming dysphagia 1 items, pt with great oral clearing. Additionally, pt with frequent coughing on dysphagia 2 textures likely d/t inability to form cohesive bolus. Pt also with inability to receive cues for rate or to re-frame from talking while consuming which results in coughing when consuming dysphagia 2 but not when consuming dysphagia  1 and nectar thick liquids. Pt's family also demonstrated how to thicken liquids to nectar thick liquids. Education also provided on pt's severe cognitive deficits as evidenced by inability to sustain attention, problem solve basic tasks and complete lack of insight into deficits. Pt demonstrated all of these deficits within functional tasks and deficits were evident. All questions answered to everyone's satisfaction.      Pain Pain Assessment Pain Scale: 0-10 Pain Score: 0-No pain  Therapy/Group: Individual Therapy  Jessica Meyer 05/01/2018, 2:55 PM

## 2018-05-01 NOTE — Progress Notes (Addendum)
Occupational Therapy Session Note  Patient Details  Name: Jessica Meyer MRN: [id number] Date of Birth: 06/11/1935  Today's Date: 05/01/2018 OT Individual Time: 1324-4010 and 1500-1610 OT Individual Time Calculation (min): 43 min and 70 min    Short Term Goals: Week 3:  OT Short Term Goal 1 (Week 3): Pt will attend to L visual field to locate self care items with max cuing. OT Short Term Goal 2 (Week 3): Pt will perform UB dressing with mod A to decrease caregiver assistance.   Skilled Therapeutic Interventions/Progress Updates:   Session 1:Upon entering the room,  Pt supine in bed and verbalizing need to have BM. Supine >sit total A to EOB. Stand pivot transfer to drop arm commode chair  Max of 2. Pt able to have BM with encouragement and cuing to attend to task. Pt standing with +2 three musketeer style while third helper assisted with hygiene and LB clothing management. Pt transferred into wheelchair. Pt ambulating 30' with +2 helpers three [employer] style with focus on weight shift and noted quad and glute activation in L LE. Pt needing max multimodal cuing to complete task. Pt returned back to room via TIS and remained in chair with chair alarm belt donned and call bell within reach. Pt positioned in order to facilitate L head turn to watch TV.  Session 2: Upon entering the room, pt's son and daughter present for hands on family education. Pt present in bed and OT recommended bathing and dressing tasks be performed from bed level for safety once returned home. OT educated family members on incorporating L visual scanning as well as reaching to the R during self care task. Pt verbalized needing to have BM. OT demonstrated how to place bed pan with caregiver returning demonstrations. Pt had already had BM and caregivers demonstrated ability to roll pt, perform hygiene, and clothing management safely with minimal cuing. OT discussed utilizing hospital bed functional for good body mechanics. OT  reviewed and demonstrated how to don L resting hand splint and wearing schedule. OT provided paper handout of PROM for L UE with caregivers returning demonstrations with min cuing. Caregivers with several questions and OT also recommended they bring questions to next session as well as they think over covered items today. Pt supine in bed with bed alarm activated and call bell within reach upon exiting the room.   Therapy Documentation Precautions:  Precautions Precautions: Fall Precaution Comments: left inattention and right gaze, increased extensor tone LLE and Flexion tone LUE at shoulder and elbow Restrictions Weight Bearing Restrictions: No   Pain: Pain Assessment Pain Scale: 0-10 Pain Score: Asleep ADL: ADL Eating: Maximal assistance Where Assessed-Eating: Bed level Grooming: Moderate assistance Where Assessed-Grooming: Bed level Upper Body Bathing: Maximal assistance Where Assessed-Upper Body Bathing: Bed level Lower Body Bathing: Maximal assistance Where Assessed-Lower Body Bathing: Bed level Upper Body Dressing: Maximal assistance Where Assessed-Upper Body Dressing: Bed level Lower Body Dressing: Dependent Where Assessed-Lower Body Dressing: Bed level Toileting: Dependent Where Assessed-Toileting: Bed level Toilet Transfer: Unable to assess   Therapy/Group: Individual Therapy  Alen Bleacher 05/01/2018, 10:46 AM

## 2018-05-01 NOTE — Progress Notes (Signed)
[place] PHYSICAL MEDICINE & REHABILITATION PROGRESS NOTE  Subjective/Complaints:  C/o LUE and LLE we discussed recent increase of gabapentin  ROS: Patient denies CP, SOB, N/V/D    Objective: Vital Signs: Blood pressure (!) 135/51, pulse (!) 57, temperature (!) 97.4 F (36.3 C), resp. rate 18, height 5\' 5"  (1.651 m), weight 66.2 kg, SpO2 98 %. No results found. No results for input(s): WBC, HGB, HCT, PLT in the last 72 hours. No results for input(s): NA, K, CL, CO2, GLUCOSE, BUN, CREATININE, CALCIUM in the last 72 hours.  Physical Exam: BP (!) 135/51 (BP Location: Right Arm)   Pulse (!) 57   Temp (!) 97.4 F (36.3 C)   Resp 18   Ht 5\' 5"  (1.651 m)   Wt 66.2 kg   SpO2 98%   BMI 24.29 kg/m  Constitutional: No distress . Vital signs reviewed. HEENT: EOMI, oral membranes moist Neck: supple Cardiovascular: RRR without murmur. No JVD    Respiratory: CTA Bilaterally without wheezes or rales. Normal effort    GI: BS +, non-tender, non-distended  Musculoskeletal: right trap tender? Neurological: She is alert Left-sided neglect   Follows commands.    Poor insight into deficits Dysarthria Motor: LLE: 0/5 proximal to distal 0/5 left upper extremity unchanged RLE: 5/5 proximal to distal Sensation absent to light touch LLE  Poor attention and concentration Increased ankle plantar flexor tone MAS 3 Left elbow flexors and finger flexors , MAS 2-3 in Left knee flexors -Tone remains elevated Skin: Skin is warm and dry. 4 bruises Left biceps at injection sites, no briuses in Hamstrings area Psychiatric: flat   Assessment/Plan: 1. Functional deficits secondary to right MCA infarct which require 3+ hours per day of interdisciplinary therapy in a comprehensive inpatient rehab setting.  Physiatrist is providing close team supervision and 24 hour management of active medical problems listed below.  Physiatrist and rehab team continue to assess barriers to discharge/monitor patient  progress toward functional and medical goals  Care Tool:  Bathing  Bathing activity did not occur: Safety/medical concerns Body parts bathed by patient: Face, Right upper leg   Body parts bathed by helper: Right arm, Left arm, Chest, Abdomen, Front perineal area, Buttocks, Left upper leg, Right lower leg, Left lower leg Body parts n/a: Front perineal area, Buttocks(pt already cleansed by nursing)   Bathing assist Assist Level: Total Assistance - Patient < 25%     Upper Body Dressing/Undressing Upper body dressing   What is the patient wearing?: Pull over shirt    Upper body assist Assist Level: Total Assistance - Patient < 25%    Lower Body Dressing/Undressing Lower body dressing    Lower body dressing activity did not occur: Safety/medical concerns What is the patient wearing?: Pants, Incontinence brief     Lower body assist Assist for lower body dressing: 2 Helpers     Toileting Toileting    Toileting assist Assist for toileting: 2 Helpers     Transfers Chair/bed transfer  Transfers assist  Chair/bed transfer activity did not occur: Safety/medical concerns  Chair/bed transfer assist level: 2 Helpers     Locomotion Ambulation   Ambulation assist   Ambulation activity did not occur: Safety/medical concerns          Walk 10 feet activity   Assist  Walk 10 feet activity did not occur: Safety/medical concerns        Walk 50 feet activity   Assist Walk 50 feet with 2 turns activity did not occur: Safety/medical concerns  Walk 150 feet activity   Assist Walk 150 feet activity did not occur: Safety/medical concerns         Walk 10 feet on uneven surface  activity   Assist Walk 10 feet on uneven surfaces activity did not occur: Safety/medical concerns         Wheelchair     Assist Will patient use wheelchair at discharge?: Yes Type of Wheelchair: Manual    Wheelchair assist level: Dependent - Patient 0% Max  wheelchair distance: 150 ft    Wheelchair 50 feet with 2 turns activity    Assist        Assist Level: Dependent - Patient 0%   Wheelchair 150 feet activity     Assist     Assist Level: Dependent - Patient 0%      Medical Problem List and Plan: 1.  Left side weakness with dysphagia secondary to right MCA infarction in the setting of right ICA and right MCA occlusion status post unsuccessful attempts at thrombectomy. CIR PT, OT, SLP.   Spasticity s/p Dysport injection 500U to L bieps and to L hamstring, expect effect ~4/30 Team conf in am 2.  Antithrombotics: -DVT/anticoagulation:  SCDs             -antiplatelet therapy: aspirin 81 mg daily, Plavix 75 mg daily 3. Pain Management:  Tylenol 3 for headache as needed, flank pain has subsided, right-sided neck pain is the current complaint. Xray confirmed cervical spondylosis +/- DDD exacerbated by neck positioning from Left neglect Kpad , sportscreme Robaxin  hydrocodone-Positional challenges pt keeps head turned toward the Left Neurogenic pain stroke, no improvement will increase gabapentin to 300mg  TID 4. Mood: Wellbutrin 300 mg daily,Lexapro 5 mg daily at bedtime, poor attention             -antipsychotic agents: N/A 5. Neuropsych: This patient is not fully capable of making decisions on her own behalf. 6. Skin/Wound Care:  Routine skin checks 7. Fluids/Electrolytes/Nutrition:  encourage PO, variable 8. Dysphagia. Dysphagia #1 honey liquids.  Follow-up speech therapy             Advance diet as tolerated- per SLP 9. Hypertension. Norvasc 5 mg daily,lisinopril 40 mg daily, Coreg 12.5 mg twice a day.    Vitals:   04/30/18 1937 05/01/18 0424  BP: (!) 112/53 (!) 135/51  Pulse: 68 (!) 57  Resp: 18 18  Temp: 98 F (36.7 C) (!) 97.4 F (36.3 C)  SpO2: 91% 98%  Controlled 4/27 10. Diabetes mellitus. Hemoglobin A1c 8.0. Glucotrol XL 2.5 mg daily, Lantus insulin 12 units daily at bedtime. Check blood sugars before meals  and at bedtime.      CBG (last 3)  Recent Labs    04/30/18 1657 04/30/18 2101 05/01/18 0638  GLUCAP 158* 204* 125*  amaryl increased to 2mg  daily 4/18, controlled 4/28 11. Hyperlipidemia. Lipitor 12. Hypothyroidism. Continue Synthroid 13.  Acute blood loss anemia:              Hemoglobin 9.3 on 4/4 follow-up 10.8 on 4/13 14.  CKD stage III:             Creatinine 1.11 on 4/4. 1.22 on 4/6  stable      LOS: 24 days A FACE TO FACE EVALUATION WAS PERFORMED  Erick Colace 05/01/2018, 8:49 AM

## 2018-05-01 NOTE — Progress Notes (Signed)
Physical Therapy Session Note  Patient Details  Name: Jessica Meyer MRN: [id number] Date of Birth: [birth year]  Today's Date: 05/01/2018 PT Individual Time: 1400-1500 PT Individual Time Calculation (min): 60 min   Short Term Goals: Week 3:  PT Short Term Goal 1 (Week 3): Patient to be able to maintain static sitting balance with Min guard on 3/5 attempts  PT Short Term Goal 2 (Week 3): Patient to be able to perform sliding board transfer with maxA of 1  PT Short Term Goal 3 (Week 3): Patient to be able to tolerate standing in standing frame for at least 15 minutes with Mod cues for postural corrections   Skilled Therapeutic Interventions/Progress Updates: Pt presented in bed with family present handed off from SLP. Session focused on family training with Jessica Meyer and son Jessica Meyer present. Pt requesting to use bedpan, performed rolling L/R maxA for bedpan placement, pt noted to be incontinent of bowel with PTA performing peri-care. Pt noted to have wet pants, doffed pants total A and threading pants total A. Pt was able to follow single step commands and perform bridge with PTA blocking LLE to allow completion of pulling pants over hips. PTA provided education on rational behind use of Hoyer vs attempting 2 person transfer. PTA performed supine to sit at EOB with maxA x2 with pt demonstrating significant posterior lean and heavy pushing to L. PTA provided edu regarding limited progress on pt's pushing and how would be a safety issue if performed x 1 person upon d/c. Pt returned to bed total A with PTA then demonstrating hoyer lift and U sling pad placement. Pt then transferred to TIS chair +2 for safety with PTA providing step by step instructions on usage. Pt repositioned in TIS chair and provided edu re: positioning and use of lap tray vs pillow and well as attempting to provide verbal cues for pt to return to midline in chair. After discussion pt's family placed u-sling in place and performed [facility]  lift transfer back to bed. PTA noted during transfer pt with small skin tear on LLE with band aide placed. Pt's family was able to demonstrate fair understanding of use of Hoyer lift, and will return on Thursday April 30th for reinforcement of edu. . Pt's family with no questions/concerns at end of session. Pt left in bed at end of session with family present awaiting next session with OT.      Therapy Documentation Precautions:  Precautions Precautions: Fall Precaution Comments: left inattention and right gaze, increased extensor tone LLE and Flexion tone LUE at shoulder and elbow Restrictions Weight Bearing Restrictions: No General:   Vital Signs:  Pain: Pain Assessment Pain Scale: 0-10 Pain Score: 0-No pain   Therapy/Group: Individual Therapy  Jessica Meyer  Jessica Meyer, PTA  05/01/2018, 3:51 PM

## 2018-05-02 ENCOUNTER — Inpatient Hospital Stay (HOSPITAL_COMMUNITY): Payer: Medicare Other | Admitting: Occupational Therapy

## 2018-05-02 ENCOUNTER — Inpatient Hospital Stay (HOSPITAL_COMMUNITY): Payer: Medicare Other | Admitting: Physical Therapy

## 2018-05-02 ENCOUNTER — Inpatient Hospital Stay (HOSPITAL_COMMUNITY): Payer: Medicare Other | Admitting: Speech Pathology

## 2018-05-02 ENCOUNTER — Inpatient Hospital Stay (HOSPITAL_COMMUNITY): Payer: Medicare Other

## 2018-05-02 LAB — GLUCOSE, CAPILLARY
Glucose-Capillary: 119 mg/dL — ABNORMAL HIGH (ref 70–99)
Glucose-Capillary: 225 mg/dL — ABNORMAL HIGH (ref 70–99)
Glucose-Capillary: 134 mg/dL — ABNORMAL HIGH (ref 70–99)
Glucose-Capillary: 198 mg/dL — ABNORMAL HIGH (ref 70–99)

## 2018-05-02 MED ORDER — GABAPENTIN 400 MG PO CAPS
400.0000 mg | ORAL_CAPSULE | Freq: Three times a day (TID) | ORAL | Status: DC
  Administered 2018-05-02 – 2018-05-05 (×9): 400 mg via ORAL
  Filled 2018-05-02 (×9): qty 1

## 2018-05-02 NOTE — Progress Notes (Signed)
Occupational Therapy Session Note  Patient Details  Name: Jessica Meyer MRN: [id number] Date of Birth: [birth year]  Today's Date: 05/02/2018 OT Individual Time: 1188-6773 OT Individual Time Calculation (min): 57 min    Short Term Goals: Week 1:  OT Short Term Goal 1 (Week 1): Pt will maintain static sitting balance EOB with mod A for 1 minute during ADL task OT Short Term Goal 1 - Progress (Week 1): Not met OT Short Term Goal 2 (Week 1): Pt will attend to L visual field for ~30 seconds with moderate verbal cueing  OT Short Term Goal 2 - Progress (Week 1): Not met OT Short Term Goal 3 (Week 1): Pt will initiate giving caregiver instructions with L UE positioning  OT Short Term Goal 3 - Progress (Week 1): Not met OT Short Term Goal 4 (Week 1): Pt will don shirt at bed level with mod A OT Short Term Goal 4 - Progress (Week 1): Not met  Skilled Therapeutic Interventions/Progress Updates:    1:1. Pt received on bed pain finishing BM. Pt rolls with MIN A to L and MAX A to R for hygiene, new breif placement and pants. Pt unable to clear hips enough using bridging method for clothing management this date despite LL Eblock. Pt requesting water and requires VC for small sips of honey thick water as well as wiping L mouth. Pt completes slide board transfer EOB<>TIS with MAX A of 2 demoing increased extensor tone and difficulty following 1 step commands. Pt completes card matching activity at tabletop. Unable to differentiate clubs v spades, but does better with hearts and diamonds requiring max VC for visual scanning to L on board and table top to locate cards. Exited session with pt seated in bed, exit alarm on and call light in reach. NT in room for feeding.  Therapy Documentation Precautions:  Precautions Precautions: Fall Precaution Comments: left inattention and right gaze, increased extensor tone LLE and Flexion tone LUE at shoulder and elbow Restrictions Weight Bearing Restrictions:  No General:   Vital Signs:   Pain: Pain Assessment Pain Scale: Faces Faces Pain Scale: Hurts even more ADL: ADL Eating: Maximal assistance Where Assessed-Eating: Bed level Grooming: Moderate assistance Where Assessed-Grooming: Bed level Upper Body Bathing: Maximal assistance Where Assessed-Upper Body Bathing: Bed level Lower Body Bathing: Maximal assistance Where Assessed-Lower Body Bathing: Bed level Upper Body Dressing: Maximal assistance Where Assessed-Upper Body Dressing: Bed level Lower Body Dressing: Dependent Where Assessed-Lower Body Dressing: Bed level Toileting: Dependent Where Assessed-Toileting: Bed level Toilet Transfer: Unable to assess Vision   Perception    Praxis   Exercises:   Other Treatments:     Therapy/Group: Individual Therapy  Tonny Branch 05/02/2018, 1:28 PM

## 2018-05-02 NOTE — Progress Notes (Signed)
Inpatient Diabetes Program Recommendations  AACE/ADA: New Consensus Statement on Inpatient Glycemic Control (2015)  Target Ranges:  Prepandial:   less than 140 mg/dL      Peak postprandial:   less than 180 mg/dL (1-2 hours)      Critically ill patients:  140 - 180 mg/dL   Lab Results  Component Value Date   GLUCAP 225 (H) 05/02/2018   HGBA1C 8.0 (H) 04/05/2018    Review of Glycemic Control Results for Jessica Meyer, Jessica Meyer [facility] (MRN [id number]) as of 05/02/2018 10:15  Ref. Range 05/01/2018 16:29 05/01/2018 21:11 05/02/2018 06:26  Glucose-Capillary Latest Ref Range: 70 - 99 mg/dL 82 381 (H) 829 (H)   Diabetes history: Type 2 DM Outpatient Diabetes medications: Glipizide 2.5 mg QAM, Lantus 17 units QD Current orders for Inpatient glycemic control: Amaryl 1 mg QAM, Lantus 12 units QHS, Novolog 0-15 units TID  Inpatient Diabetes Program Recommendations:    Spoke with daughter, Jessica Meyer, by phone to answer questions she related to patient going home. She tells me the patient was independent prior to the event and was seeing [name], endocrinology, for outpatient DM control. Had questions that were specific to the honey thickened diet and interventions to be mindful of with respect to diabetes.  Reviewed patient's current A1c of 8.0%. Explained what a A1c is and what it measures. Also reviewed goal A1c with patient, importance of good glucose control @ home, and blood sugar goals.  Patient was checking blood sugars 2 times per day prior to admission and they would range from 90-150's mg/dL. Encouraged to continue checking CBGs and to consider increasing 3-4 times per day. Discussed that the patient seems to increase around lunch time, so discussed obtaining CBG: FSBG, with lunch and dinner and to start checking 2 hours following lunch, as this information will help [name] for potential insulin order changes. Also, discussed home medications that patient was taking prior to admission. Patient was taking  Lantus QD as opposed to QHS. With her history of hypoglycemia, discussed ways of getting back to home regimen, per endocrinology and when to reach out to MD. Plans to make follow up appointment with [name]. When discussing patient's diet, it was reported that she ate sandwiches, cold cuts, and prepared her own meals. Now meals will be different and prepared for her by family members. Feel that help could be provided with suggestions for preparation with focus to carb content. Dietitian consult placed as they the team could provide suggestions and help the transition home.  At this time, there are no further questions.   Thanks, Lujean Rave, MSN, RNC-OB Diabetes Coordinator [phone] (8a-5p)

## 2018-05-02 NOTE — Progress Notes (Signed)
Speech Language Pathology Daily Session Note  Patient Details  Name: Jessica Meyer MRN: [id number] Date of Birth: June 26, 1935  Today's Date: 05/02/2018 SLP Individual Time: 1130-1200 SLP Individual Time Calculation (min): 30 min  Short Term Goals: Week 4: SLP Short Term Goal 1 (Week 4): Pt will consume trials of thin liquids via cup sips with minimal overt s/s of aspiration over 3 sessions to demonstrate readiness for diet upgrade.  SLP Short Term Goal 2 (Week 4): Pt will consume trials of dysphagia 2 with minimal overt s/s of aspiration/dysphagia over 3 sessions to demonstrate readiness for diet upgrade.  SLP Short Term Goal 3 (Week 4): Pt will follow 1 step simple directions in 5 out of 10 opportunities with Mod A cues over 3 sessions.  SLP Short Term Goal 4 (Week 4): Pt will answer basic yes/no questions related to self/orientation/current situation in 5 out of 10 opportunities with Mod A cues over 3 sessions.  SLP Short Term Goal 5 (Week 4): Pt will complete basic familiar problem solving tasks related to ADLs (washing her face, brushing teeth, brushing hair) with Mod A cues.  SLP Short Term Goal 6 (Week 4): Pt will sustain attention to task for ~ 5 minutes with Mod A cues for redirection to task.   Skilled Therapeutic Interventions:  Skilled treatment session focused on dysphagia and cognition goals. SLP facilitated session by providing Max A multimodal cues for sustained attention to basic problem solving task. Additionally, SLP provided skilled observation of pt consuming nectar thick liquids with dysphagia 2 snack. Pt continues with decreased bolus cohesion when consuming dysphagia 2 and increased widespread oral residue throughout consumption. Pt with cough x 2 with dysphagia 2 bolus. No overt s/s of aspiration when consuming nectar thick liquids. Pt left upright in bed, bed alarm on and all needs within reach. Continue per current plan of care.      Pain Pain Assessment Pain Scale:  Faces Faces Pain Scale: Hurts even more  Therapy/Group: Individual Therapy  Jessica Meyer 05/02/2018, 1:58 PM

## 2018-05-02 NOTE — Progress Notes (Signed)
[place] PHYSICAL MEDICINE & REHABILITATION PROGRESS NOTE  Subjective/Complaints: Still with LUE and LLE pain, no recent falls Family training reportedly went well  ROS: Patient denies CP, SOB, N/V/D    Objective: Vital Signs: Blood pressure 129/72, pulse 78, temperature 98.2 F (36.8 C), temperature source Oral, resp. rate 18, height 5\' 5"  (1.651 m), weight 66.3 kg, SpO2 98 %. No results found. No results for input(s): WBC, HGB, HCT, PLT in the last 72 hours. No results for input(s): NA, K, CL, CO2, GLUCOSE, BUN, CREATININE, CALCIUM in the last 72 hours.  Physical Exam: BP 129/72   Pulse 78   Temp 98.2 F (36.8 C) (Oral)   Resp 18   Ht 5\' 5"  (1.651 m)   Wt 66.3 kg   SpO2 98%   BMI 24.33 kg/m  Constitutional: No distress . Vital signs reviewed. HEENT: EOMI, oral membranes moist Neck: supple Cardiovascular: RRR without murmur. No JVD    Respiratory: CTA Bilaterally without wheezes or rales. Normal effort    GI: BS +, non-tender, non-distended  Musculoskeletal: right trap tender? Neurological: She is alert Left-sided neglect   Follows commands.    Poor insight into deficits Dysarthria Motor: LLE: 0/5 proximal to distal 0/5 left upper extremity unchanged RLE: 5/5 proximal to distal Sensation absent to light touch LLE  Poor attention and concentration Increased ankle plantar flexor tone MAS 2 Left elbow flexors and finger flexors , MAS 3 in Left knee flexors -Less pain with ROM of LUE and LLE Skin: Skin is warm and dry.Psychiatric: flat   Assessment/Plan: 1. Functional deficits secondary to right MCA infarct which require 3+ hours per day of interdisciplinary therapy in a comprehensive inpatient rehab setting.  Physiatrist is providing close team supervision and 24 hour management of active medical problems listed below.  Physiatrist and rehab team continue to assess barriers to discharge/monitor patient progress toward functional and medical goals  Care  Tool:  Bathing  Bathing activity did not occur: Safety/medical concerns Body parts bathed by patient: Face, Right upper leg   Body parts bathed by helper: Right arm, Left arm, Chest, Abdomen, Front perineal area, Buttocks, Left upper leg, Right lower leg, Left lower leg Body parts n/a: Front perineal area, Buttocks(pt already cleansed by nursing)   Bathing assist Assist Level: Total Assistance - Patient < 25%     Upper Body Dressing/Undressing Upper body dressing   What is the patient wearing?: Pull over shirt    Upper body assist Assist Level: Total Assistance - Patient < 25%    Lower Body Dressing/Undressing Lower body dressing    Lower body dressing activity did not occur: Safety/medical concerns What is the patient wearing?: Pants, Incontinence brief     Lower body assist Assist for lower body dressing: 2 Helpers     Toileting Toileting    Toileting assist Assist for toileting: 2 Helpers     Transfers Chair/bed transfer  Transfers assist  Chair/bed transfer activity did not occur: Safety/medical concerns  Chair/bed transfer assist level: Dependent - mechanical lift     Locomotion Ambulation   Ambulation assist   Ambulation activity did not occur: Safety/medical concerns  Assist level: 2 helpers Assistive device: No Device Max distance: 30'   Walk 10 feet activity   Assist  Walk 10 feet activity did not occur: Safety/medical concerns  Assist level: 2 helpers     Walk 50 feet activity   Assist Walk 50 feet with 2 turns activity did not occur: Safety/medical concerns  Walk 150 feet activity   Assist Walk 150 feet activity did not occur: Safety/medical concerns         Walk 10 feet on uneven surface  activity   Assist Walk 10 feet on uneven surfaces activity did not occur: Safety/medical concerns         Wheelchair     Assist Will patient use wheelchair at discharge?: Yes Type of Wheelchair: Manual     Wheelchair assist level: Dependent - Patient 0% Max wheelchair distance: 150 ft    Wheelchair 50 feet with 2 turns activity    Assist        Assist Level: Dependent - Patient 0%   Wheelchair 150 feet activity     Assist     Assist Level: Dependent - Patient 0%      Medical Problem List and Plan: 1.  Left side weakness with dysphagia secondary to right MCA infarction in the setting of right ICA and right MCA occlusion status post unsuccessful attempts at thrombectomy. CIR PT, OT, SLP.   Spasticity s/p Dysport injection 500U to L bieps and to L hamstring, expect effect ~4/30 Team conf in am 2.  Antithrombotics: -DVT/anticoagulation:  SCDs             -antiplatelet therapy: aspirin 81 mg daily, Plavix 75 mg daily 3. Pain Management:  Tylenol 3 for headache as needed, flank pain has subsided, right-sided neck pain is the current complaint. Xray confirmed cervical spondylosis +/- DDD exacerbated by neck positioning from Left neglect Kpad , sportscreme Robaxin  hydrocodone-Positional challenges pt keeps head turned toward the Left Neurogenic pain stroke, no improvement will increase gabapentin to 400mg TID-monitor sedation  4. Mood: Wellbutrin 300 mg daily,Lexapro 5 mg daily at bedtime, poor attention             -antipsychotic agents: N/A 5. Neuropsych: This patient is not fully capable of making decisions on her own behalf. 6. Skin/Wound Care:  Routine skin checks 7. Fluids/Electrolytes/Nutrition:  encourage PO, variable 8. Dysphagia. Dysphagia #1 honey liquids.  Follow-up speech therapy             Advance diet as tolerated- per SLP 9. Hypertension. Norvasc 5 mg daily,lisinopril 40 mg daily, Coreg 12.5 mg twice a day.    Vitals:   05/02/18 0532 05/02/18 0909  BP: 122/68 129/72  Pulse: 62 78  Resp: 18   Temp: 98.2 F (36.8 C)   SpO2: 98%   Controlled 4/29 10. Diabetes mellitus. Hemoglobin A1c 8.0. Glucotrol XL 2.5 mg daily, Lantus insulin 12 units daily at  bedtime. Check blood sugars before meals and at bedtime.      CBG (last 3)  Recent Labs    05/01/18 1629 05/01/18 2111 05/02/18 0626  GLUCAP 82 190* 119*  amaryl increased to 2mg  daily 4/18, controlled 4/29 11. Hyperlipidemia. Lipitor 12. Hypothyroidism. Continue Synthroid 13.  Acute blood loss anemia:              Hemoglobin 9.3 on 4/4 follow-up 10.8 on 4/13 14.  CKD stage III:             Creatinine 1.11 on 4/4. 1.22 on 4/6  stable      LOS: 25 days A FACE TO FACE EVALUATION WAS PERFORMED  Erick Colacendrew E Jancarlo Biermann 05/02/2018, 9:48 AM

## 2018-05-02 NOTE — Progress Notes (Signed)
Physical Therapy Session Note  Patient Details  Name: Jessica Meyer MRN: [id number] Date of Birth: [birth year]  Today's Date: 05/02/2018 PT Individual Time: 1420-1515 PT Individual Time Calculation (min): 55 min   Short Term Goals: Week 3:  PT Short Term Goal 1 (Week 3): Patient to be able to maintain static sitting balance with Min guard on 3/5 attempts  PT Short Term Goal 2 (Week 3): Patient to be able to perform sliding board transfer with maxA of 1  PT Short Term Goal 3 (Week 3): Patient to be able to tolerate standing in standing frame for at least 15 minutes with Mod cues for postural corrections   Skilled Therapeutic Interventions/Progress Updates:   Pt received supine in bed and agreeable to PT, with significant convincing. Supine>sit transfer with max assist assist and max cues for postural contral and midline awareness. Total assist squat pivot transfer to WC with bobath technique and max cues for WB over the RLE to allow transfer and prevent pushers syndrome. Pt transported to to rehab gym in [facility] for [facility] evaluation. Sitting balance in WC x for ATC to obtain pt measurements with no rail to support L side. Lateral scooting in WC x 2 to the L for improved pelivic alignment. Trunk control for forward weight shifting and R weight shifting also performed x 3 with mod assist and RUE support for improved spinal and trunk control/mobility assessment. Pt returned to room and adamant that she return to bed so that she could finish lunch. Squat pivot transfer to bed with total assist on the R. Total assist for sitting balance initially, as pt unable to follow 1 step command to reposition hips in order to reduce pushing sensation. Sit>supine with total +2 for trunk and LE placement, once pt sitting EOB with mod assist and decreased pushers syndrome. Left in bed with NT present.       Therapy Documentation Precautions:  Precautions Precautions: Fall Precaution Comments: left inattention  and right gaze, increased extensor tone LLE and Flexion tone LUE at shoulder and elbow Restrictions Weight Bearing Restrictions: No Vital Signs: Therapy Vitals Temp: (!) 97.5 F (36.4 C) Pulse Rate: 65 BP: 119/71 Patient Position (if appropriate): Lying Oxygen Therapy SpO2: 94 % O2 Device: Room Air Pain:   denies at rest   Therapy/Group: Individual Therapy  Golden Pop 05/02/2018, 5:48 PM

## 2018-05-02 NOTE — Progress Notes (Signed)
Occupational Therapy Session Note  Patient Details  Name: Jessica Meyer MRN: [id number] Date of Birth: 05/28/1935  Today's Date: 05/02/2018 OT Individual Time: 0815-0900 OT Individual Time Calculation (min): 45 min    Short Term Goals: Week 3:  OT Short Term Goal 1 (Week 3): Pt will attend to L visual field to locate self care items with max cuing. OT Short Term Goal 2 (Week 3): Pt will perform UB dressing with mod A to decrease caregiver assistance.   Skilled Therapeutic Interventions/Progress Updates:   Upon entering the room, pt supine in bed and reports needing to use bed pan. Pt rolling L <> R with max A and having small bowel movement. Total A to don LB clothing items and hygiene. Pt placed into hoyer lift with +2 for safety and transferred into tilt in space wheelchair. Pt donning pull over shirt with mod A this session. She did look to the L and attempt to pull shirt up UE when given mod cuing for task. Pt needing max cuing for sequencing and attention but also able to get R UE into sleeve and over head with assist to pull down trunk. Focus on anterior weight shift in order to pull down trunk. Pt remained seated in wheelchair with alarm chair belt donned and tilted slightly for comfort.    Therapy Documentation Precautions:  Precautions Precautions: Fall Precaution Comments: left inattention and right gaze, increased extensor tone LLE and Flexion tone LUE at shoulder and elbow Restrictions Weight Bearing Restrictions: No General:   Vital Signs: Therapy Vitals Temp: (!) 97.5 F (36.4 C) Pulse Rate: 65 BP: 119/71 Patient Position (if appropriate): Lying Oxygen Therapy SpO2: 94 % O2 Device: Room Air Pain: Pain Assessment Faces Pain Scale: Hurts little more ADL: ADL Eating: Maximal assistance Where Assessed-Eating: Bed level Grooming: Moderate assistance Where Assessed-Grooming: Bed level Upper Body Bathing: Maximal assistance Where Assessed-Upper Body Bathing: Bed  level Lower Body Bathing: Maximal assistance Where Assessed-Lower Body Bathing: Bed level Upper Body Dressing: Maximal assistance Where Assessed-Upper Body Dressing: Bed level Lower Body Dressing: Dependent Where Assessed-Lower Body Dressing: Bed level Toileting: Dependent Where Assessed-Toileting: Bed level Toilet Transfer: Unable to assess   Therapy/Group: Individual Therapy  Alen Bleacher 05/02/2018, 4:50 PM

## 2018-05-03 ENCOUNTER — Inpatient Hospital Stay (HOSPITAL_COMMUNITY): Payer: Medicare Other

## 2018-05-03 ENCOUNTER — Inpatient Hospital Stay (HOSPITAL_COMMUNITY): Payer: Medicare Other | Admitting: Physical Therapy

## 2018-05-03 ENCOUNTER — Inpatient Hospital Stay (HOSPITAL_COMMUNITY): Payer: Medicare Other | Admitting: Speech Pathology

## 2018-05-03 ENCOUNTER — Encounter (HOSPITAL_COMMUNITY): Payer: Medicare Other | Admitting: Occupational Therapy

## 2018-05-03 ENCOUNTER — Inpatient Hospital Stay (HOSPITAL_COMMUNITY): Payer: Medicare Other | Admitting: Occupational Therapy

## 2018-05-03 LAB — GLUCOSE, CAPILLARY
Glucose-Capillary: 126 mg/dL — ABNORMAL HIGH (ref 70–99)
Glucose-Capillary: 152 mg/dL — ABNORMAL HIGH (ref 70–99)
Glucose-Capillary: 190 mg/dL — ABNORMAL HIGH (ref 70–99)
Glucose-Capillary: 173 mg/dL — ABNORMAL HIGH (ref 70–99)

## 2018-05-03 MED ORDER — ACCU-CHEK AVIVA PLUS W/DEVICE KIT: PACK | 0 refills | Status: AC

## 2018-05-03 MED ORDER — LISINOPRIL 40 MG PO TABS: 40.0000 mg | ORAL_TABLET | Freq: Every day | ORAL | 0 refills | Status: AC

## 2018-05-03 MED ORDER — HYDROCODONE-ACETAMINOPHEN 5-325 MG PO TABS: 1.0000 | ORAL_TABLET | Freq: Four times a day (QID) | ORAL | 0 refills | Status: AC | PRN

## 2018-05-03 MED ORDER — CARVEDILOL 12.5 MG PO TABS: ORAL_TABLET | ORAL | 3 refills | Status: AC

## 2018-05-03 MED ORDER — CLOPIDOGREL BISULFATE 75 MG PO TABS: 75.0000 mg | ORAL_TABLET | Freq: Every day | ORAL | 0 refills | Status: AC

## 2018-05-03 MED ORDER — ACCU-CHEK SOFTCLIX LANCETS MISC: 1 refills | Status: AC

## 2018-05-03 MED ORDER — GLIMEPIRIDE 1 MG PO TABS: 1.0000 mg | ORAL_TABLET | Freq: Every day | ORAL | 1 refills | Status: DC

## 2018-05-03 MED ORDER — AMLODIPINE BESYLATE 5 MG PO TABS: 5.0000 mg | ORAL_TABLET | Freq: Every day | ORAL | 1 refills | Status: AC

## 2018-05-03 MED ORDER — LEVOTHYROXINE SODIUM 125 MCG PO TABS: 62.5000 ug | ORAL_TABLET | Freq: Every day | ORAL | 0 refills | Status: DC

## 2018-05-03 MED ORDER — INSULIN GLARGINE 100 UNIT/ML SOLOSTAR PEN: 12.0000 [IU] | PEN_INJECTOR | Freq: Every day | SUBCUTANEOUS | 11 refills | Status: DC

## 2018-05-03 MED ORDER — METHOCARBAMOL 500 MG PO TABS: 500.0000 mg | ORAL_TABLET | Freq: Three times a day (TID) | ORAL | 0 refills | Status: AC | PRN

## 2018-05-03 MED ORDER — GABAPENTIN 400 MG PO CAPS: 400.0000 mg | ORAL_CAPSULE | Freq: Three times a day (TID) | ORAL | 0 refills | Status: DC

## 2018-05-03 MED ORDER — ATORVASTATIN CALCIUM 10 MG PO TABS: 10.0000 mg | ORAL_TABLET | Freq: Every day | ORAL | 3 refills | Status: AC

## 2018-05-03 MED ORDER — ESCITALOPRAM OXALATE 5 MG PO TABS: 5.0000 mg | ORAL_TABLET | Freq: Every day | ORAL | 5 refills | Status: DC

## 2018-05-03 MED ORDER — HYDROXYZINE HCL 50 MG PO TABS: 50.0000 mg | ORAL_TABLET | Freq: Every day | ORAL | 0 refills | Status: AC

## 2018-05-03 MED ORDER — BUPROPION HCL 100 MG PO TABS: 100.0000 mg | ORAL_TABLET | Freq: Three times a day (TID) | ORAL | 1 refills | Status: AC

## 2018-05-03 NOTE — Progress Notes (Signed)
Speech Language Pathology Daily Session Note  Patient Details  Name: Jessica Meyer MRN: [id number] Date of Birth: June 11, 1935  Today's Date: 05/03/2018 SLP Individual Time: 1030-1100 SLP Individual Time Calculation (min): 30 min  Short Term Goals: Week 4: SLP Short Term Goal 1 (Week 4): Pt will consume trials of thin liquids via cup sips with minimal overt s/s of aspiration over 3 sessions to demonstrate readiness for diet upgrade.  SLP Short Term Goal 2 (Week 4): Pt will consume trials of dysphagia 2 with minimal overt s/s of aspiration/dysphagia over 3 sessions to demonstrate readiness for diet upgrade.  SLP Short Term Goal 3 (Week 4): Pt will follow 1 step simple directions in 5 out of 10 opportunities with Mod A cues over 3 sessions.  SLP Short Term Goal 4 (Week 4): Pt will answer basic yes/no questions related to self/orientation/current situation in 5 out of 10 opportunities with Mod A cues over 3 sessions.  SLP Short Term Goal 5 (Week 4): Pt will complete basic familiar problem solving tasks related to ADLs (washing her face, brushing teeth, brushing hair) with Mod A cues.  SLP Short Term Goal 6 (Week 4): Pt will sustain attention to task for ~ 5 minutes with Mod A cues for redirection to task.   Skilled Therapeutic Interventions: Pt was seen for skilled ST intervention targeting cognition, including attention to the left, direction following, and functional problem solving. Nurse Tech was present upon arrival of SLP. Pt was requesting water. SLP had brought nectar thick apple juice, however, pt did not want this. SLP left to acquire nectar thick water. Pt was noted to take large boluses, with consequent left anterior leakage. Pt asked several times within a few minutes when lunch was going to arrive. When told it would be an hour or more, pt indicated she was hungry. Pt was given applesauce, then pudding. No obvious oral residue noted, and no overt s/s aspiration elicited. Following po  trials, pt was given the family photo which has been reviewed with her during multiple treatment sessions. Pt required Mod+ verbal and visual cues to attend to the left of the picture to identify all family members pictured. She continues to be able to answer simple yes/no questions and follow basic instructions, however, her significant hearing loss adversely affects effective communication. Pt was left in chair with alarm set, all needs within reach. Continue per current plan of care.   Pain Pain Assessment Pain Scale: Faces Faces Pain Scale: Hurts a little bit Pain Type: Acute pain Pain Location: Arm Pain Orientation: Left Pain Descriptors / Indicators: Aching Pain Onset: On-going Pain Intervention(s): Repositioned  Therapy/Group: Individual Therapy   B. Murvin Natal, [facility], CCC-SLP Speech Language Pathologist  Leigh Aurora 05/03/2018, 1:38 PM

## 2018-05-03 NOTE — Progress Notes (Signed)
Physical Therapy Session Note  Patient Details  Name: Jessica Meyer MRN: [id number] Date of Birth: 1935-05-21  Today's Date: 05/03/2018 PT Individual Time: 1405-1500 PT Individual Time Calculation (min): 55 min   Short Term Goals: Week 3:  PT Short Term Goal 1 (Week 3): Patient to be able to maintain static sitting balance with Min guard on 3/5 attempts  PT Short Term Goal 2 (Week 3): Patient to be able to perform sliding board transfer with maxA of 1  PT Short Term Goal 3 (Week 3): Patient to be able to tolerate standing in standing frame for at least 15 minutes with Mod cues for postural corrections   Skilled Therapeutic Interventions/Progress Updates:   Pt received sitting in WC and agreeable to PT. Family present for family education. Pt transported to day room in [place]. PT educated pt's daughter and son-in-law in [facility] parts management for safe transfers. SB demonstration performed with daughter with max cues for proper SB positioning as importance and anterior weight shift. PT  Performed SB transfer with pt to and From [facility] with max assist + 2 for stability of SB. Max cues for R weight shift, anterior weight shift, and proper UE placement in lap to prevent pushers syndrome. Sitting balance EOB with mod-max assist from PT and from Pt's family moderate cues for how to provide external cues vs internal due to limited perceptual awareness. Returned to room in [place] Pt's family performed [place] lift transfer to bed from [facility]. Increased time, but only min cues for family to safely position sling and manage LE to position in supine on bed. Pt left supine in bed with call bell in reach and all needs met.         Therapy Documentation Precautions:  Precautions Precautions: Fall Precaution Comments: left inattention and right gaze, increased extensor tone LLE and Flexion tone LUE at shoulder and elbow Restrictions Weight Bearing Restrictions: No    Pain: Pain Assessment Pain Scale: Faces Faces  Pain Scale: Hurts a little bit Pain Type: Acute pain Pain Location: Arm Pain Orientation: Left Pain Descriptors / Indicators: Aching Pain Onset: On-going Pain Intervention(s): Repositioned    Therapy/Group: Individual Therapy  Lorie Phenix 05/03/2018, 3:19 PM

## 2018-05-03 NOTE — Progress Notes (Signed)
Physical Therapy Session Note  Patient Details  Name: Deprise Novelo MRN: [id number] Date of Birth: Jun 29, 1935  Today's Date: 05/03/2018 PT Individual Time: 0800-0845 PT Individual Time Calculation (min): 45 min   Short Term Goals: Week 3:  PT Short Term Goal 1 (Week 3): Patient to be able to maintain static sitting balance with Min guard on 3/5 attempts  PT Short Term Goal 2 (Week 3): Patient to be able to perform sliding board transfer with maxA of 1  PT Short Term Goal 3 (Week 3): Patient to be able to tolerate standing in standing frame for at least 15 minutes with Mod cues for postural corrections   Skilled Therapeutic Interventions/Progress Updates:     Patient in bed upon PT arrival. Patient alert and agreeable to PT session.  Therapeutic Activity: Bed Mobility: Patient performed supine to sit with mod A of 2 due to posterior lean and pushing with R UE, improved with R UE in lap. Provided verbal cues for leaning forward and rolling to her R before sitting up. Transfers: Patient performed sit to/from stand x1 with mod A of 1 and CGA of a second person for safety, a stand pivot x1 with mod-max A of 2 due to extensor tone preventing a safe sit back into the TIS w/c. She also performed a squat pivot transfer from the bed to the w/c and to the mat table from the w/c with mod A of 1 and SBA of a second person for safety.  Neuromuscular Re-ed: Patient performed sitting balance EOB for 2 minutes without UE support with mod-CGA, able to maintain CGA for ~10 seconds. Performed sitting balance EOM x4 minutes with min A-CGA with encouragement to lean forward while looking ahead with B hands in her lap. Encourages patient to attend to L throughout session, identifying photos within the room.  There-ex: -L heel cord and hamstring stretch in sitting for 1 min x2 while RN administered morning medications.  Patient in TIS w/c  at end of session with breaks locked, try table under L UE with a  pillow, seat belt alarm set, and all needs within reach.    Therapy Documentation Precautions:  Precautions Precautions: Fall Precaution Comments: left inattention and right gaze, increased extensor tone LLE and Flexion tone LUE at shoulder and elbow Restrictions Weight Bearing Restrictions: No Pain: Reported 8/10 L UE pain at beginning of session, RN administered meds during session and PT provided repositioning and distraction throughout session for pain intervention.     Therapy/Group: Individual Therapy  Jemma Rasp L Brandin Dilday PT, DPT  05/03/2018, 12:16 PM

## 2018-05-03 NOTE — Discharge Summary (Signed)
Physician Discharge Summary  Patient ID: Jessica Meyer MRN: [id number] DOB/AGE: 08/30/1935 83 y.o.  Admit date: 04/07/2018 Discharge date: 05/05/2018  Discharge Diagnoses:  Active Problems:   Spastic hemiparesis of left nondominant side due to acute cerebral infarction [facility])   Right middle cerebral artery stroke (HCC)   Neck pain on right side DVT prophylaxis Mood stabilization Dysphasia Hypertension Diabetes mellitus Hyperlipidemia Hypothyroidism Acute blood loss anemia CKD stage III  Discharged Condition: Stable  Significant Diagnostic Studies: Ct Angio Head W Or Wo Contrast  Result Date: 04/04/2018 CLINICAL DATA:  History of hypertension and hyperlipidemia. LEFT facial droop and LEFT extremity weakness. EXAM: CT ANGIOGRAPHY HEAD AND NECK CT PERFUSION BRAIN TECHNIQUE: Multidetector CT imaging of the head and neck was performed using the standard protocol during bolus administration of intravenous contrast. Multiplanar CT image reconstructions and MIPs were obtained to evaluate the vascular anatomy. Carotid stenosis measurements (when applicable) are obtained utilizing NASCET criteria, using the distal internal carotid diameter as the denominator. Multiphase CT imaging of the brain was performed following IV bolus contrast injection. Subsequent parametric perfusion maps were calculated using RAPID software. CONTRAST:  133m OMNIPAQUE IOHEXOL 350 MG/ML SOLN COMPARISON:  CT HEAD April 04, 2018 at 2137 hours. FINDINGS: CTA NECK FINDINGS: AORTIC ARCH: Normal appearance of the thoracic arch, normal branch pattern. Mild calcific atherosclerosis aortic arch. The origins of the innominate, left Common carotid artery and subclavian artery are patent. Mild RIGHT and moderate LEFT subclavian artery stenosis due to atherosclerosis. RIGHT CAROTID SYSTEM: Common carotid artery is patent. Calcific atherosclerosis, occluded RIGHT internal carotid artery with thready faint recanalization. LEFT CAROTID SYSTEM:  Common carotid artery is patent. Calcific atherosclerosis with short segment critical stenosis LEFT ICA origin. VERTEBRAL ARTERIES:Left vertebral artery is dominant. Severe stenosis bilateral vertebral artery origins. Patent vertebral arteries. SKELETON: No acute osseous process though bone windows have not been submitted. OTHER NECK: Soft tissues of the neck are nonacute though, not tailored for evaluation. C2-3 canal stenosis due to disc osteophyte complex. UPPER CHEST: Included lung apices are clear. No superior mediastinal lymphadenopathy. CTA HEAD FINDINGS: ANTERIOR CIRCULATION: RIGHT petrous 2 cavernous ICA occluded with minimal reconstitution at carotid siphon. LEFT internal carotid artery is patent with moderate stenosis supraclinoid segment. Patent bilateral carotid termini. Emergent RIGHT M1 occlusion with intermediate collateralization by single-phase CTA. Patent anterior cerebral arteries and LEFT MCA. Moderate tandem stenosis LEFT ACA. No  contrast extravasation or aneurysm. POSTERIOR CIRCULATION: Patent vertebral arteries, vertebrobasilar junction and basilar artery, as well as main branch vessels. Severe stenosis RIGHT V4 segment. Patent posterior cerebral arteries, mild tandem stenoses compatible with atherosclerosis. No large vessel occlusion, flow-limiting stenosis, contrast extravasation or aneurysm. VENOUS SINUSES: Major dural venous sinuses are patent though not tailored for evaluation on this angiographic examination. ANATOMIC VARIANTS: None. DELAYED PHASE: Not performed. MIP images reviewed. CT Brain Perfusion Findings: Moderate motion degraded examination. CBF (<30%) Volume: 142mPerfusion (Tmax>6.0s) volume: 8666mismatch Volume: 61m21mfarction Location:RIGHT frontotemporal parietal penumbra with RIGHT basal ganglia, RIGHT periventricular and RIGHT temporal core infarcts. IMPRESSION: CTA NECK: 1. Occluded RIGHT ICA with trace reconstitution. 2. Critical stenosis LEFT ICA origin. 3. Severe  stenosis bilateral vertebral artery origins, patent vertebral arteries. CTA HEAD: 1. Occluded RIGHT ICA with reconstitution at carotid terminus. Emergent RIGHT M1 occlusion with intermediate collaterals by single-phase CTA. CT PERFUSION: 1. Motion degraded examination. Large area RIGHT MCA penumbra with 9 cc RIGHT basal ganglia and possible temporal lobe core infarcts. 2. Moderate stenoses LEFT ACA. Critical Value/emergent results text paged to [facility]  via AMION secure system on 04/04/2018 at 10:08 pm, including interpreting physician's phone number. Acute findings discussed with and reconfirmed by Dr.MCNEILL [facility] on 04/04/2018 at 10:20 pm. Aortic Atherosclerosis (ICD10-I70.0). Electronically Signed   By: Elon Alas M.D.   On: 04/04/2018 22:21   Dg Chest 2 View  Result Date: 04/10/2018 CLINICAL DATA:  Right side weakness. History of stroke. Question aspiration. EXAM: CHEST - 2 VIEW COMPARISON:  Single-view of the chest 01/25/2016. FINDINGS: Minimal atelectasis is seen in the left lung base. The lungs are otherwise clear. Heart size is upper normal. Aortic atherosclerosis is noted. No pneumothorax or pleural effusion. No acute or focal bony abnormality. IMPRESSION: Negative for aspiration.  No acute disease. Atherosclerosis. Electronically Signed   By: Inge Rise M.D.   On: 04/10/2018 08:24   Dg Cervical Spine Complete  Result Date: 04/17/2018 CLINICAL DATA:  83 year old female with right-sided neck pain EXAM: CERVICAL SPINE - COMPLETE 4+ VIEW COMPARISON:  CTA neck 04/04/2018 FINDINGS: No evidence of acute fracture or malalignment. Multilevel degenerative disc disease most severe at C2-C3 and C5-C6. No significant interval change comparing across modalities to the prior CT scan. No prevertebral soft tissue swelling. Carotid artery calcifications are noted incidentally. IMPRESSION: 1. No acute fracture or malalignment. 2. Multilevel degenerative disc disease most severe at C2-C3  and C5-C6. Electronically Signed   By: Jacqulynn Cadet M.D.   On: 04/17/2018 14:20   Ct Angio Neck W Or Wo Contrast  Result Date: 04/04/2018 CLINICAL DATA:  History of hypertension and hyperlipidemia. LEFT facial droop and LEFT extremity weakness. EXAM: CT ANGIOGRAPHY HEAD AND NECK CT PERFUSION BRAIN TECHNIQUE: Multidetector CT imaging of the head and neck was performed using the standard protocol during bolus administration of intravenous contrast. Multiplanar CT image reconstructions and MIPs were obtained to evaluate the vascular anatomy. Carotid stenosis measurements (when applicable) are obtained utilizing NASCET criteria, using the distal internal carotid diameter as the denominator. Multiphase CT imaging of the brain was performed following IV bolus contrast injection. Subsequent parametric perfusion maps were calculated using RAPID software. CONTRAST:  11m OMNIPAQUE IOHEXOL 350 MG/ML SOLN COMPARISON:  CT HEAD April 04, 2018 at 2137 hours. FINDINGS: CTA NECK FINDINGS: AORTIC ARCH: Normal appearance of the thoracic arch, normal branch pattern. Mild calcific atherosclerosis aortic arch. The origins of the innominate, left Common carotid artery and subclavian artery are patent. Mild RIGHT and moderate LEFT subclavian artery stenosis due to atherosclerosis. RIGHT CAROTID SYSTEM: Common carotid artery is patent. Calcific atherosclerosis, occluded RIGHT internal carotid artery with thready faint recanalization. LEFT CAROTID SYSTEM: Common carotid artery is patent. Calcific atherosclerosis with short segment critical stenosis LEFT ICA origin. VERTEBRAL ARTERIES:Left vertebral artery is dominant. Severe stenosis bilateral vertebral artery origins. Patent vertebral arteries. SKELETON: No acute osseous process though bone windows have not been submitted. OTHER NECK: Soft tissues of the neck are nonacute though, not tailored for evaluation. C2-3 canal stenosis due to disc osteophyte complex. UPPER CHEST: Included  lung apices are clear. No superior mediastinal lymphadenopathy. CTA HEAD FINDINGS: ANTERIOR CIRCULATION: RIGHT petrous 2 cavernous ICA occluded with minimal reconstitution at carotid siphon. LEFT internal carotid artery is patent with moderate stenosis supraclinoid segment. Patent bilateral carotid termini. Emergent RIGHT M1 occlusion with intermediate collateralization by single-phase CTA. Patent anterior cerebral arteries and LEFT MCA. Moderate tandem stenosis LEFT ACA. No  contrast extravasation or aneurysm. POSTERIOR CIRCULATION: Patent vertebral arteries, vertebrobasilar junction and basilar artery, as well as main branch vessels. Severe stenosis RIGHT V4 segment. Patent posterior cerebral arteries, mild  tandem stenoses compatible with atherosclerosis. No large vessel occlusion, flow-limiting stenosis, contrast extravasation or aneurysm. VENOUS SINUSES: Major dural venous sinuses are patent though not tailored for evaluation on this angiographic examination. ANATOMIC VARIANTS: None. DELAYED PHASE: Not performed. MIP images reviewed. CT Brain Perfusion Findings: Moderate motion degraded examination. CBF (<30%) Volume: 39m Perfusion (Tmax>6.0s) volume: 855mMismatch Volume: 7330mnfarction Location:RIGHT frontotemporal parietal penumbra with RIGHT basal ganglia, RIGHT periventricular and RIGHT temporal core infarcts. IMPRESSION: CTA NECK: 1. Occluded RIGHT ICA with trace reconstitution. 2. Critical stenosis LEFT ICA origin. 3. Severe stenosis bilateral vertebral artery origins, patent vertebral arteries. CTA HEAD: 1. Occluded RIGHT ICA with reconstitution at carotid terminus. Emergent RIGHT M1 occlusion with intermediate collaterals by single-phase CTA. CT PERFUSION: 1. Motion degraded examination. Large area RIGHT MCA penumbra with 9 cc RIGHT basal ganglia and possible temporal lobe core infarcts. 2. Moderate stenoses LEFT ACA. Critical Value/emergent results text p[facility]  secure system on 04/04/2018 at 10:08 pm, including interpreting physician's phone number. Acute findings discussed with and reconfirmed by Dr[facility] 04/04/2018 at 10:20 pm. Aortic Atherosclerosis (ICD10-I70.0). Electronically Signed   By: CouElon AlasD.   On: 04/04/2018 22:21   Mr Brain Wo Contrast  Result Date: 04/05/2018 CLINICAL DATA:  Left-sided weakness. Right M1 occlusion with unsuccessful attempted revascularization due to proximal right ICA occlusion. EXAM: MRI HEAD WITHOUT CONTRAST TECHNIQUE: Multiplanar, multiecho pulse sequences of the brain and surrounding structures were obtained without intravenous contrast. COMPARISON:  Head CT, CTA, and CTP for 120 FINDINGS: Some sequences are moderately to severely motion degraded. Brain: There is a moderate-sized acute right MCA territory infarct involving the temporal lobe, insula, basal ganglia, and frontoparietal operculum. Additional small foci of predominantly cortical acute infarction are present more superiorly in the right frontal and parietal lobes. There are also multiple small acute infarcts involving left frontal and parietal cortex, left centrum semiovale, and superior left occipital lobe. No associated hemorrhage is evident within limitations of motion artifact. Multiple small chronic infarcts involving right frontoparietal cortex and white matter in a parasagittal distribution likely reflect remote watershed ischemia. Patchy T2 hyperintensities elsewhere in the cerebral white matter bilaterally are nonspecific but compatible with moderate chronic small vessel ischemic disease. There is moderate central predominant cerebral atrophy with asymmetric dilatation of the left lateral ventricle. Small chronic infarcts are present in the cerebellum bilaterally. No mass/mass effect or extra-axial fluid collection is identified. Vascular: Abnormal appearance of the proximal intracranial right ICA related to known proximal occlusion. Skull  and upper cervical spine: Grossly unremarkable bone marrow signal. Sinuses/Orbits: Unremarkable orbits. Small left mastoid effusion. Clear paranasal sinuses. Other: None. IMPRESSION: 1. Moderate-sized acute right MCA infarct. 2. Additional punctate acute infarcts in the cerebrum bilaterally. 3. Moderate chronic small vessel ischemic disease with chronic lacunar infarcts as above. Electronically Signed   By: AllLogan BoresD.   On: 04/05/2018 14:34   Ir Bellinghamesult Date: 04/05/2018 INDICATION: New onset left-sided weakness with neglect. Occluded right internal carotid artery at the bulb, and also the right middle cerebral artery M1 segment distal to the anterior temporal branch.  EXAM: 1. EMERGENT LARGE VESSEL OCCLUSION THROMBOLYSIS (anterior CIRCULATION)  COMPARISON:  CT angiogram of the head and neck of 04/04/2018.  MEDICATIONS: 2 g Ancef IV antibiotic was administered within 1 hour of the procedure.  ANESTHESIA/SEDATION: General anesthesia.  CONTRAST:  Isovue 300 approximately 130 mL.  FLUOROSCOPY TIME:  Fluoroscopy Time: 83 minutes 54 seconds (498 mGy).  COMPLICATIONS:  None immediate.  TECHNIQUE: Following a full explanation of the procedure along with the potential associated complications, an informed witnessed consent was obtained from the patient's daughter. The risks of intracranial hemorrhage of 10%, worsening neurological deficit, ventilator dependency, death and inability to revascularize were all reviewed in detail with the patient's patient's daughter.  The patient was then put under general anesthesia by the Department of Anesthesiology at [facility].  The right groin was prepped and draped in the usual sterile fashion. Thereafter using modified Seldinger technique, transfemoral access into the right common femoral artery was obtained without difficulty. Over a 0.035 inch guidewire a 5 French Pinnacle sheath was inserted. Through this, and also over a 0.035 inch guidewire  a 5 [place] JB 1 catheter was advanced to the aortic arch region and selectively positioned in the innominate artery, the left common carotid artery and the right common carotid artery.  FINDINGS: The innominate artery injection demonstrates significant tortuosity at the origin of the innominate artery, and also of the aortic arch.  The right subclavian artery demonstrates mild stenosis proximally. More distally, a hypoplastic right vertebral artery is seen opacifying to the cranial skull base.  The right common carotid arteriogram demonstrates approximately 50% stenosis of the right external carotid artery at its origin. Its branches opacify normally.  The right internal carotid artery demonstrates near complete occlusion of the right internal carotid artery at the bulb with contrast in the proximal 1/3 of the right internal carotid artery associated with a calcified plaque.  More distally, there is partial reconstitution of the right internal carotid artery cavernous and supraclinoid segment from the right external carotid artery branches into the right ophthalmic artery.  Delayed opacification of the right middle cerebral artery is seen. The left common carotid arteriogram demonstrates approximately 50% stenosis of the left common carotid artery proximal to the bifurcation. The left external carotid artery demonstrates a 50% stenosis at its origin. Its branches opacify normally, otherwise.  The left internal carotid artery at the bulb demonstrates severe approximately 90-95% stenosis secondary to a circumferential plaque. More distally, the vessel is seen to opacify to the cranial skull base. The petrous, cavernous and supraclinoid segments demonstrate wide patency.  The left middle cerebral artery and the left anterior cerebral artery opacify into the capillary and venous phases.  Prompt cross filling via the anterior communicating artery of the right anterior cerebral A2 segment and distally is seen  from the left internal carotid artery injection.  PROCEDURE: The diagnostic JB 1 catheter in the right common carotid artery was exchanged over a 0.035 inch 300 cm Rosen exchange guidewire for an 8 French 55 cm Brite tip neurovascular sheath. Good aspiration obtained from the side port of the neurovascular sheath. This was then connected to continuous heparinized saline infusion. Over the exchange guidewire, an 8 [place] 85 cm FlowGate guide catheter which had been prepped with 50% contrast and 50% heparinized saline infusion was advanced and positioned just proximal to the right common carotid bifurcation. The guidewire was removed good aspiration obtained from the hub of the [facility] guide catheter. A gentle control arteriogram demonstrates safe position without evidence of intraluminal filling defects or of vasospasm.  An 021 Trevo ProVue microcatheter was then advanced over a 0.014 inch Softip Synchro micro guidewire to the distal end of the [facility] guide catheter.  Using a torque device, access through the occluded right internal carotid artery was obtained with the micro guidewire followed by the microcatheter which was advanced to the first  horizontal segment of the petrous right ICA. The guidewire was removed. Good aspiration obtained from the hub of the microcatheter. A gentle control arteriogram performed through the microcatheter demonstrated patency of the right internal carotid artery distal petrous, cavernous and supraclinoid segments. Right middle cerebral artery opacification was seen with occlusion distal to the origin of the anterior temporal branch. Right anterior cerebral artery remained patent.  At this time, a 4 mm x 30 mm and 014 inch Viatrac balloon which had been prepped with 50% contrast and 50% heparinized saline infusion was advanced to the site of the occlusion using the rapid exchange technique.  However, despite multiple attempts and manipulation of the 8 [place] FlowGate guide  catheter including expansion of the balloon and for anchorage, access through the occluded right internal carotid artery could not be obtained with the balloon.  This was then removed.  This was then followed by the advancement of the microcatheter to the petrous right ICA. The exchange micro guidewire was removed. Through the microcatheter, and over a 0.014 inch Softip Synchro micro guidewire, and then an 016 inch Headliner micro guidewire, access into the right middle cerebral artery M2 M3 region inferior division could be obtained, however, advancement of the microcatheter was met with persistent herniation secondary to the displacement of the [facility] guide catheter secondary to severe tortuosity. Mechanical thrombolysis with the micro guidewire was then performed in the hope to macerate the clot in the right middle cerebral artery distal M1 segment.  A control arteriogram performed following this continued to demonstrate occluded right internal carotid artery at the bulb.  Over the exchange guidewire, another attempt was made using a 3 mm x 15 mm Gateway balloon guide catheter using the over the wire technique. Again this was met with significant resistance to the advancement of the angioplasty microcatheter.  Two angioplasties were performed in the proximal portion of the occluded right internal carotid at the bulb. Again following these angioplasties, there was improved blood noted into the right internal carotid artery distally and intracranially.  Further attempts at advancing the angioplasty balloon guide catheter through this again was met with unsuccessful advancement through the hard plaque in the right internal carotid artery at the bulb.  It was, therefore, decided to stop. Throughout the procedure, the patient's blood pressure and neurological status remained stable. Patient was loaded with 180 mg of Brilinta, and 81 mg of aspirin prior to the treatment via an orogastric tube.  The 8 [place]  FlowGate guide catheter and the 8 [place] neurovascular sheath were then retrieved into the abdominal aorta and exchanged over a J-tip guidewire for an 8 [place] Pinnacle sheath. This in turn was then removed with the successful placement and closure using a 7 [place] ExoSeal closure device. The right groin appeared soft without evidence of a hematoma or bleeding. Distal pulses remained palpable in the dorsalis pedis, and posterior tibial regions bilaterally at the end of procedure unchanged.  CT of the brain obtained demonstrated no evidence of hemorrhage, or mass effect or midline shift.  Patient was left intubated and transferred to the ICU for further post treatment management.  IMPRESSION: Status post endovascular mechanical thrombolysis of the right middle cerebral artery M1 segment with micro guidewire as described.  Balloon angioplasty x 2 of the symptomatic acute occlusion of the right internal carotid artery at the bulb.  PLAN: Follow-up point in clinic 4 weeks post discharge.   Electronically Signed   By: Luanne Bras M.D.   On: 04/07/2018 12:29  Scans on Order [id number] Scan on 04/05/2018 1:26 AM by Armando Reichert, RT   Ct C-spine No Charge  Result Date: 04/04/2018 CLINICAL DATA:  Neck pain. EXAM: CT CERVICAL SPINE WITHOUT CONTRAST TECHNIQUE: Multidetector reformatted imaging of the cervical spine was performed from CTA NECK. COMPARISON:  None. FINDINGS: ALIGNMENT: Straightened lordosis.  No malalignment. SKULL BASE AND VERTEBRAE: Cervical vertebral bodies and posterior elements are intact. Moderate C2-3, C5-6 disc height loss, mild at C3-4 with endplate spurring compatible with degenerative discs. C1-2 articulation maintained with mild arthropathy. Osteopenia without destructive bony lesions. Small RIGHT C7 rib. SOFT TISSUES AND SPINAL CANAL: Please see dedicated CTA from same day, reported separately for vascular findings. DISC LEVELS: Severe canal stenosis C2-3, mild at C3-4. Moderate  RIGHT C2-3, RIGHT C3-4 neural foraminal narrowing. UPPER CHEST: Lung apices are clear. OTHER: None. IMPRESSION: 1. No fracture or malalignment. 2. Severe canal stenosis C2-3. Moderate C2-3 and C3-4 neural foraminal narrowing. Electronically Signed   By: Elon Alas M.D.   On: 04/04/2018 23:32   Ct Cerebral Perfusion W Contrast  Result Date: 04/04/2018 CLINICAL DATA:  History of hypertension and hyperlipidemia. LEFT facial droop and LEFT extremity weakness. EXAM: CT ANGIOGRAPHY HEAD AND NECK CT PERFUSION BRAIN TECHNIQUE: Multidetector CT imaging of the head and neck was performed using the standard protocol during bolus administration of intravenous contrast. Multiplanar CT image reconstructions and MIPs were obtained to evaluate the vascular anatomy. Carotid stenosis measurements (when applicable) are obtained utilizing NASCET criteria, using the distal internal carotid diameter as the denominator. Multiphase CT imaging of the brain was performed following IV bolus contrast injection. Subsequent parametric perfusion maps were calculated using RAPID software. CONTRAST:  142m OMNIPAQUE IOHEXOL 350 MG/ML SOLN COMPARISON:  CT HEAD April 04, 2018 at 2137 hours. FINDINGS: CTA NECK FINDINGS: AORTIC ARCH: Normal appearance of the thoracic arch, normal branch pattern. Mild calcific atherosclerosis aortic arch. The origins of the innominate, left Common carotid artery and subclavian artery are patent. Mild RIGHT and moderate LEFT subclavian artery stenosis due to atherosclerosis. RIGHT CAROTID SYSTEM: Common carotid artery is patent. Calcific atherosclerosis, occluded RIGHT internal carotid artery with thready faint recanalization. LEFT CAROTID SYSTEM: Common carotid artery is patent. Calcific atherosclerosis with short segment critical stenosis LEFT ICA origin. VERTEBRAL ARTERIES:Left vertebral artery is dominant. Severe stenosis bilateral vertebral artery origins. Patent vertebral arteries. SKELETON: No acute  osseous process though bone windows have not been submitted. OTHER NECK: Soft tissues of the neck are nonacute though, not tailored for evaluation. C2-3 canal stenosis due to disc osteophyte complex. UPPER CHEST: Included lung apices are clear. No superior mediastinal lymphadenopathy. CTA HEAD FINDINGS: ANTERIOR CIRCULATION: RIGHT petrous 2 cavernous ICA occluded with minimal reconstitution at carotid siphon. LEFT internal carotid artery is patent with moderate stenosis supraclinoid segment. Patent bilateral carotid termini. Emergent RIGHT M1 occlusion with intermediate collateralization by single-phase CTA. Patent anterior cerebral arteries and LEFT MCA. Moderate tandem stenosis LEFT ACA. No  contrast extravasation or aneurysm. POSTERIOR CIRCULATION: Patent vertebral arteries, vertebrobasilar junction and basilar artery, as well as main branch vessels. Severe stenosis RIGHT V4 segment. Patent posterior cerebral arteries, mild tandem stenoses compatible with atherosclerosis. No large vessel occlusion, flow-limiting stenosis, contrast extravasation or aneurysm. VENOUS SINUSES: Major dural venous sinuses are patent though not tailored for evaluation on this angiographic examination. ANATOMIC VARIANTS: None. DELAYED PHASE: Not performed. MIP images reviewed. CT Brain Perfusion Findings: Moderate motion degraded examination. CBF (<30%) Volume: 132mPerfusion (Tmax>6.0s) volume: 8666mismatch Volume: 11m59mfarction Location:RIGHT frontotemporal parietal penumbra with RIGHT  basal ganglia, RIGHT periventricular and RIGHT temporal core infarcts. IMPRESSION: CTA NECK: 1. Occluded RIGHT ICA with trace reconstitution. 2. Critical stenosis LEFT ICA origin. 3. Severe stenosis bilateral vertebral artery origins, patent vertebral arteries. CTA HEAD: 1. Occluded RIGHT ICA with reconstitution at carotid terminus. Emergent RIGHT M1 occlusion with intermediate collaterals by single-phase CTA. CT PERFUSION: 1. Motion degraded  examination. Large area RIGHT MCA penumbra with 9 cc RIGHT basal ganglia and possible temporal lobe core infarcts. 2. Moderate stenoses LEFT ACA. Critical Value/emergent results text paged to Dr.MCNEILL [facility] via [employer] secure system on 04/04/2018 at 10:08 pm, including interpreting physician's phone number. Acute findings discussed with and reconfirmed by Dr.MCNEILL [facility] on 04/04/2018 at 10:20 pm. Aortic Atherosclerosis (ICD10-I70.0). Electronically Signed   By: Elon Alas M.D.   On: 04/04/2018 22:21   Dg Swallowing Func-speech Pathology  Result Date: 04/19/2018 Objective Swallowing Evaluation: Type of Study: MBS-Modified Barium Swallow Study  Patient Details Name: Jessica Meyer MRN: [id number] Date of Birth: March 25, 1935 Today's Date: 04/19/2018 Time: SLP Start Time (ACUTE ONLY): 1057 -SLP Stop Time (ACUTE ONLY): 1121 SLP Time Calculation (min) (ACUTE ONLY): 24 min Past Medical History: Past Medical History: Diagnosis Date . Arthritis  . Diabetes mellitus without complication ([place])  . History of chicken pox  . Hyperlipidemia  . Hypertension  . Thyroid disease   hypothyroid Past Surgical History: Past Surgical History: Procedure Laterality Date . ABDOMINAL HYSTERECTOMY   . HIP ARTHROPLASTY Right 01/26/2016  Procedure: ARTHROPLASTY BIPOLAR HIP (HEMIARTHROPLASTY);  Surgeon: Renette Butters, MD;  Location: [place];  Service: Orthopedics;  Laterality: Right; . IR ANGIO EXTRACRAN SEL COM CAROTID INNOMINATE UNI L MOD SED  04/05/2018 . IR CT HEAD LTD  04/05/2018 . IR PERCUTANEOUS ART THROMBECTOMY/INFUSION INTRACRANIAL INC DIAG ANGIO  04/05/2018 . KNEE ARTHROSCOPY Bilateral  . RADIOLOGY WITH ANESTHESIA N/A 04/04/2018  Procedure: RADIOLOGY WITH ANESTHESIA;  Surgeon: Radiologist, Medication, MD;  Location: [place];  Service: Radiology;  Laterality: N/A; . REPLACEMENT TOTAL KNEE BILATERAL    1998 left , 1992 right HPI: 83 y.o. female with a history of hypertension, hyperlipidemia, diabetes who presented with left-sided  weakness and neglect. Pt with right MCA occlusion.   Bilateral common carotid arteriograms followed unsuccessful attempt at revascularization of Rt MCA occlusion due to prox Rt ICA occlusion despite multiple angioplasties. intubated for procedure only. Failed RN stroke swallow screen.  Subjective: alert but groggy Assessment / Plan / Recommendation CHL IP CLINICAL IMPRESSIONS 04/19/2018 Clinical Impression Pt with improved oropharyngeal abilities as evidenced by swallow initiaiton at the pyriform sinuses when consuming thin liquids. Pt has improved glottal closure and protects airway when consuming thin liquids. Pt with one instance of sensed aspiration when consuming large consecutive sips. Pt challenged with impaired positioning in radiology chair and multiple trials across thin liquids, mechanical soft and pill whole in applesauce. Pt is appropriate for upgrade to dysphagia 2, thin liquids and medicine whole in puree. Continue full nursing supervision d/t congitive and vision impairments.  SLP Visit Diagnosis Dysphagia, oropharyngeal phase (R13.12);Cognitive communication deficit (R41.841) Attention and concentration deficit following -- Frontal lobe and executive function deficit following -- Impact on safety and function Mild aspiration risk   CHL IP TREATMENT RECOMMENDATION 04/09/2018 Treatment Recommendations Therapy as outlined in treatment plan below   No flowsheet data found. CHL IP DIET RECOMMENDATION 04/19/2018 SLP Diet Recommendations Dysphagia 2 (Fine chop) solids;Thin liquid Liquid Administration via Cup Medication Administration Whole meds with puree Compensations Slow rate;Minimize environmental distractions;Small sips/bites;Lingual sweep for clearance of pocketing Postural  Changes Seated upright at 90 degrees;Remain semi-upright after after feeds/meals (Comment)   CHL IP OTHER RECOMMENDATIONS 04/19/2018 Recommended Consults -- Oral Care Recommendations Oral care BID Other Recommendations --   CHL IP  FOLLOW UP RECOMMENDATIONS 04/19/2018 Follow up Recommendations Inpatient Rehab   CHL IP FREQUENCY AND DURATION 04/05/2018 Speech Therapy Frequency (ACUTE ONLY) min 2x/week Treatment Duration --      CHL IP ORAL PHASE 04/19/2018 Oral Phase -- Oral - Pudding Teaspoon -- Oral - Pudding Cup -- Oral - Honey Teaspoon -- Oral - Honey Cup -- Oral - Nectar Teaspoon -- Oral - Nectar Cup -- Oral - Nectar Straw -- Oral - Thin Teaspoon -- Oral - Thin Cup -- Oral - Thin Straw -- Oral - Puree -- Oral - Mech Soft Weak lingual manipulation;Delayed oral transit Oral - Regular -- Oral - Multi-Consistency -- Oral - Pill -- Oral Phase - Comment --  CHL IP PHARYNGEAL PHASE 04/19/2018 Pharyngeal Phase Impaired Pharyngeal- Pudding Teaspoon -- Pharyngeal -- Pharyngeal- Pudding Cup -- Pharyngeal -- Pharyngeal- Honey Teaspoon NT Pharyngeal -- Pharyngeal- Honey Cup NT Pharyngeal -- Pharyngeal- Nectar Teaspoon NT Pharyngeal -- Pharyngeal- Nectar Cup -- Pharyngeal -- Pharyngeal- Nectar Straw -- Pharyngeal -- Pharyngeal- Thin Teaspoon Delayed swallow initiation-pyriform sinuses Pharyngeal -- Pharyngeal- Thin Cup Delayed swallow initiation-pyriform sinuses Pharyngeal -- Pharyngeal- Thin Straw -- Pharyngeal -- Pharyngeal- Puree -- Pharyngeal -- Pharyngeal- Mechanical Soft WFL Pharyngeal -- Pharyngeal- Regular -- Pharyngeal -- Pharyngeal- Multi-consistency -- Pharyngeal -- Pharyngeal- Pill WFL Pharyngeal -- Pharyngeal Comment --  CHL IP CERVICAL ESOPHAGEAL PHASE 04/19/2018 Cervical Esophageal Phase WFL Pudding Teaspoon -- Pudding Cup -- Honey Teaspoon -- Honey Cup -- Nectar Teaspoon -- Nectar Cup -- Nectar Straw -- Thin Teaspoon -- Thin Cup -- Thin Straw -- Puree -- Mechanical Soft -- Regular -- Multi-consistency -- Pill -- Cervical Esophageal Comment -- [name] 04/19/2018, 9:32 AM              Dg Swallowing Func-speech Pathology  Result Date: 04/09/2018 Objective Swallowing Evaluation: Type of Study: Bedside Swallow Evaluation  Patient Details  Name: Jessica Meyer MRN: [id number] Date of Birth: [birth year] Today's Date: 04/09/2018 Time: SLP Start Time (ACUTE ONLY): 1057 -SLP Stop Time (ACUTE ONLY): 1121 SLP Time Calculation (min) (ACUTE ONLY): 24 min Past Medical History: Past Medical History: Diagnosis Date . Arthritis  . Diabetes mellitus without complication ([place])  . History of chicken pox  . Hyperlipidemia  . Hypertension  . Thyroid disease   hypothyroid Past Surgical History: Past Surgical History: Procedure Laterality Date . ABDOMINAL HYSTERECTOMY   . HIP ARTHROPLASTY Right 01/26/2016  Procedure: ARTHROPLASTY BIPOLAR HIP (HEMIARTHROPLASTY);  Surgeon: Renette Butters, MD;  Location: [place];  Service: Orthopedics;  Laterality: Right; . IR ANGIO EXTRACRAN SEL COM CAROTID INNOMINATE UNI L MOD SED  04/05/2018 . IR CT HEAD LTD  04/05/2018 . IR PERCUTANEOUS ART THROMBECTOMY/INFUSION INTRACRANIAL INC DIAG ANGIO  04/05/2018 . KNEE ARTHROSCOPY Bilateral  . RADIOLOGY WITH ANESTHESIA N/A 04/04/2018  Procedure: RADIOLOGY WITH ANESTHESIA;  Surgeon: Radiologist, Medication, MD;  Location: [place];  Service: Radiology;  Laterality: N/A; . REPLACEMENT TOTAL KNEE BILATERAL    1998 left , 1992 right HPI: 83 y.o. female with a history of hypertension, hyperlipidemia, diabetes who presented with left-sided weakness and neglect. Pt with right MCA occlusion.   Bilateral common carotid arteriograms followed unsuccessful attempt at revascularization of Rt MCA occlusion due to prox Rt ICA occlusion despite multiple angioplasties. intubated for procedure only. Failed RN stroke swallow screen.  Subjective: alert  but groggy Assessment / Plan / Recommendation CHL IP CLINICAL IMPRESSIONS 04/09/2018 Clinical Impression Pt with sensed aspiration of nectar thick liquids via spoon d/t incomplete glottal closure during swallow (PAS 7). Trials of liquid textures were limited d/t severity of coughing fits with aspiration. Trialed head turn to pt's left but resulted in incomplete turn and aspiration.  Honey thick liquids via cup sips and puree were functional.  Would recommend trialing chin tuck with spoonfuls of nectar thick liquids at bedside to observe if that makes clinical difference in aspiration. Continue puree diet with downgrade to honey thick liquids, medicine crushed in applesauce and full supervision.  SLP Visit Diagnosis Dysphagia, pharyngeal phase (R13.13);Cognitive communication deficit (R41.841) Attention and concentration deficit following -- Frontal lobe and executive function deficit following -- Impact on safety and function Severe aspiration risk;Risk for inadequate nutrition/hydration   CHL IP TREATMENT RECOMMENDATION 04/09/2018 Treatment Recommendations Therapy as outlined in treatment plan below   No flowsheet data found. CHL IP DIET RECOMMENDATION 04/09/2018 SLP Diet Recommendations Dysphagia 1 (Puree) solids;Honey thick liquids Liquid Administration via Cup Medication Administration Crushed with puree Compensations Slow rate;Minimize environmental distractions;Small sips/bites;Lingual sweep for clearance of pocketing Postural Changes Seated upright at 90 degrees   CHL IP OTHER RECOMMENDATIONS 04/09/2018 Recommended Consults -- Oral Care Recommendations Oral care BID Other Recommendations Order thickener from pharmacy   CHL IP FOLLOW UP RECOMMENDATIONS 04/06/2018 Follow up Recommendations Inpatient Rehab   CHL IP FREQUENCY AND DURATION 04/05/2018 Speech Therapy Frequency (ACUTE ONLY) min 2x/week Treatment Duration --      CHL IP ORAL PHASE 04/09/2018 Oral Phase WFL Oral - Pudding Teaspoon -- Oral - Pudding Cup -- Oral - Honey Teaspoon -- Oral - Honey Cup -- Oral - Nectar Teaspoon -- Oral - Nectar Cup -- Oral - Nectar Straw -- Oral - Thin Teaspoon -- Oral - Thin Cup -- Oral - Thin Straw -- Oral - Puree -- Oral - Mech Soft -- Oral - Regular -- Oral - Multi-Consistency -- Oral - Pill -- Oral Phase - Comment --  CHL IP PHARYNGEAL PHASE 04/09/2018 Pharyngeal Phase Impaired Pharyngeal- Pudding Teaspoon --  Pharyngeal -- Pharyngeal- Pudding Cup -- Pharyngeal -- Pharyngeal- Honey Teaspoon WFL Pharyngeal -- Pharyngeal- Honey Cup WFL Pharyngeal -- Pharyngeal- Nectar Teaspoon Reduced airway/laryngeal closure;Moderate aspiration;Penetration/Aspiration during swallow Pharyngeal Material enters airway, passes BELOW cords and not ejected out despite cough attempt by patient Pharyngeal- Nectar Cup -- Pharyngeal -- Pharyngeal- Nectar Straw -- Pharyngeal -- Pharyngeal- Thin Teaspoon -- Pharyngeal -- Pharyngeal- Thin Cup -- Pharyngeal -- Pharyngeal- Thin Straw -- Pharyngeal -- Pharyngeal- Puree -- Pharyngeal -- Pharyngeal- Mechanical Soft -- Pharyngeal -- Pharyngeal- Regular -- Pharyngeal -- Pharyngeal- Multi-consistency -- Pharyngeal -- Pharyngeal- Pill -- Pharyngeal -- Pharyngeal Comment --  CHL IP CERVICAL ESOPHAGEAL PHASE 04/09/2018 Cervical Esophageal Phase WFL Pudding Teaspoon -- Pudding Cup -- Honey Teaspoon -- Honey Cup -- Nectar Teaspoon -- Nectar Cup -- Nectar Straw -- Thin Teaspoon -- Thin Cup -- Thin Straw -- Puree -- Mechanical Soft -- Regular -- Multi-consistency -- Pill -- Cervical Esophageal Comment -- [name] 04/09/2018, 2:06 PM              Ir Percutaneous Art Thrombectomy/infusion Intracranial Inc Diag Angio  Result Date: 04/09/2018 INDICATION: New onset left-sided weakness with neglect. Occluded right internal carotid artery at the bulb, and also the right middle cerebral artery M1 segment distal to the anterior temporal branch. EXAM: 1. EMERGENT LARGE VESSEL OCCLUSION THROMBOLYSIS (anterior CIRCULATION) COMPARISON:  CT angiogram of the head and  neck of 04/04/2018. MEDICATIONS: 2 g Ancef IV antibiotic was administered within 1 hour of the procedure. ANESTHESIA/SEDATION: General anesthesia. CONTRAST:  Isovue 300 approximately 130 mL. FLUOROSCOPY TIME:  Fluoroscopy Time: 83 minutes 54 seconds (498 mGy). COMPLICATIONS: None immediate. TECHNIQUE: Following a full explanation of the procedure along with the  potential associated complications, an informed witnessed consent was obtained from the patient's daughter. The risks of intracranial hemorrhage of 10%, worsening neurological deficit, ventilator dependency, death and inability to revascularize were all reviewed in detail with the patient's patient's daughter. The patient was then put under general anesthesia by the Department of Anesthesiology at [facility]. The right groin was prepped and draped in the usual sterile fashion. Thereafter using modified Seldinger technique, transfemoral access into the right common femoral artery was obtained without difficulty. Over a 0.035 inch guidewire a 5 French Pinnacle sheath was inserted. Through this, and also over a 0.035 inch guidewire a 5 [place] JB 1 catheter was advanced to the aortic arch region and selectively positioned in the innominate artery, the left common carotid artery and the right common carotid artery. FINDINGS: The innominate artery injection demonstrates significant tortuosity at the origin of the innominate artery, and also of the aortic arch. The right subclavian artery demonstrates mild stenosis proximally. More distally, a hypoplastic right vertebral artery is seen opacifying to the cranial skull base. The right common carotid arteriogram demonstrates approximately 50% stenosis of the right external carotid artery at its origin. Its branches opacify normally. The right internal carotid artery demonstrates near complete occlusion of the right internal carotid artery at the bulb with contrast in the proximal 1/3 of the right internal carotid artery associated with a calcified plaque. More distally, there is partial reconstitution of the right internal carotid artery cavernous and supraclinoid segment from the right external carotid artery branches into the right ophthalmic artery. Delayed opacification of the right middle cerebral artery is seen. The left common carotid arteriogram demonstrates  approximately 50% stenosis of the left common carotid artery proximal to the bifurcation. The left external carotid artery demonstrates a 50% stenosis at its origin. Its branches opacify normally, otherwise. The left internal carotid artery at the bulb demonstrates severe approximately 90-95% stenosis secondary to a circumferential plaque. More distally, the vessel is seen to opacify to the cranial skull base. The petrous, cavernous and supraclinoid segments demonstrate wide patency. The left middle cerebral artery and the left anterior cerebral artery opacify into the capillary and venous phases. Prompt cross filling via the anterior communicating artery of the right anterior cerebral A2 segment and distally is seen from the left internal carotid artery injection. PROCEDURE: The diagnostic JB 1 catheter in the right common carotid artery was exchanged over a 0.035 inch 300 cm Rosen exchange guidewire for an 8 French 55 cm Brite tip neurovascular sheath. Good aspiration obtained from the side port of the neurovascular sheath. This was then connected to continuous heparinized saline infusion. Over the exchange guidewire, an 8 [place] 85 cm FlowGate guide catheter which had been prepped with 50% contrast and 50% heparinized saline infusion was advanced and positioned just proximal to the right common carotid bifurcation. The guidewire was removed good aspiration obtained from the hub of the [facility] guide catheter. A gentle control arteriogram demonstrates safe position without evidence of intraluminal filling defects or of vasospasm. An 021 Trevo ProVue microcatheter was then advanced over a 0.014 inch Softip Synchro micro guidewire to the distal end of the [facility] guide catheter. Using a torque device,  access through the occluded right internal carotid artery was obtained with the micro guidewire followed by the microcatheter which was advanced to the first horizontal segment of the petrous right ICA. The guidewire  was removed. Good aspiration obtained from the hub of the microcatheter. A gentle control arteriogram performed through the microcatheter demonstrated patency of the right internal carotid artery distal petrous, cavernous and supraclinoid segments. Right middle cerebral artery opacification was seen with occlusion distal to the origin of the anterior temporal branch. Right anterior cerebral artery remained patent. At this time, a 4 mm x 30 mm and 014 inch Viatrac balloon which had been prepped with 50% contrast and 50% heparinized saline infusion was advanced to the site of the occlusion using the rapid exchange technique. However, despite multiple attempts and manipulation of the 8 [place] FlowGate guide catheter including expansion of the balloon and for anchorage, access through the occluded right internal carotid artery could not be obtained with the balloon. This was then removed. This was then followed by the advancement of the microcatheter to the petrous right ICA. The exchange micro guidewire was removed. Through the microcatheter, and over a 0.014 inch Softip Synchro micro guidewire, and then an 016 inch Headliner micro guidewire, access into the right middle cerebral artery M2 M3 region inferior division could be obtained, however, advancement of the microcatheter was met with persistent herniation secondary to the displacement of the [facility] guide catheter secondary to severe tortuosity. Mechanical thrombolysis with the micro guidewire was then performed in the hope to macerate the clot in the right middle cerebral artery distal M1 segment. A control arteriogram performed following this continued to demonstrate occluded right internal carotid artery at the bulb. Over the exchange guidewire, another attempt was made using a 3 mm x 15 mm Gateway balloon guide catheter using the over the wire technique. Again this was met with significant resistance to the advancement of the angioplasty microcatheter. Two  angioplasties were performed in the proximal portion of the occluded right internal carotid at the bulb. Again following these angioplasties, there was improved blood noted into the right internal carotid artery distally and intracranially. Further attempts at advancing the angioplasty balloon guide catheter through this again was met with unsuccessful advancement through the hard plaque in the right internal carotid artery at the bulb. It was, therefore, decided to stop. Throughout the procedure, the patient's blood pressure and neurological status remained stable. Patient was loaded with 180 mg of Brilinta, and 81 mg of aspirin prior to the treatment via an orogastric tube. The 8 [place] FlowGate guide catheter and the 8 [place] neurovascular sheath were then retrieved into the abdominal aorta and exchanged over a J-tip guidewire for an 8 [place] Pinnacle sheath. This in turn was then removed with the successful placement and closure using a 7 [place] ExoSeal closure device. The right groin appeared soft without evidence of a hematoma or bleeding. Distal pulses remained palpable in the dorsalis pedis, and posterior tibial regions bilaterally at the end of procedure unchanged. CT of the brain obtained demonstrated no evidence of hemorrhage, or mass effect or midline shift. Patient was left intubated and transferred to the ICU for further post treatment management. IMPRESSION: Status post endovascular mechanical thrombolysis of the right middle cerebral artery M1 segment with micro guidewire as described. Balloon angioplasty x 2 of the symptomatic acute occlusion of the right internal carotid artery at the bulb. PLAN: Follow-up point in clinic 4 weeks post discharge. Electronically Signed   By: Luanne Bras  M.D.   On: 04/07/2018 12:29   Ct Head Code Stroke Wo Contrast  Result Date: 04/04/2018 CLINICAL DATA:  Code stroke. Last seen normal 1530 hours. LEFT facial droop. LEFT extremity weakness. EXAM: CT HEAD  WITHOUT CONTRAST TECHNIQUE: Contiguous axial images were obtained from the base of the skull through the vertex without intravenous contrast. COMPARISON:  None. FINDINGS: Brain: Mild to moderate atrophy, not unexpected for age. Ventriculomegaly, hydrocephalus ex vacuo. Hypoattenuation of white matter, both focal and confluent, likely chronic microvascular ischemic change, but no clear areas of cortical hypodensity. No hemorrhage, mass lesion, or extra-axial fluid. Vascular: Calcification of the cavernous internal carotid arteries and distal LEFT vertebral artery consistent with cerebrovascular atherosclerotic disease. Asymmetric hyperattenuation RIGHT MCA in the sylvian fissure, could reflect acute thrombus in one or both M2 MCA branches. Skull: Normal. Negative for fracture or focal lesion. Sinuses/Orbits: No acute finding. Other: None. ASPECTS [facility] Stroke Program Early CT Score) - Ganglionic level infarction (caudate, lentiform nuclei, internal capsule, insula, M1-M3 cortex): 7 - Supraganglionic infarction (M4-M6 cortex): 3 Total score (0-10 with 10 being normal): 10 IMPRESSION: 1. Asymmetric hyperattenuation RIGHT MCA in the Sylvian fissure could reflect acute thrombus in one or both M2 branches. 2. Atrophy and small vessel disease. 3. ASPECTS is 10. These results were communicated to Dr. Leonel Ramsay at 9:46 pmon 4/1/2020by text page via the [facility] messaging system. * Electronically Signed   By: Staci Righter M.D.   On: 04/04/2018 21:49   Ir Angio Extracran Sel Com Carotid Innominate Uni L Mod Sed  Result Date: 04/05/2018 INDICATION: New onset left-sided weakness with neglect. Occluded right internal carotid artery at the bulb, and also the right middle cerebral artery M1 segment distal to the anterior temporal branch.  EXAM: 1. EMERGENT LARGE VESSEL OCCLUSION THROMBOLYSIS (anterior CIRCULATION)  COMPARISON:  CT angiogram of the head and neck of 04/04/2018.  MEDICATIONS: 2 g Ancef IV antibiotic was  administered within 1 hour of the procedure.  ANESTHESIA/SEDATION: General anesthesia.  CONTRAST:  Isovue 300 approximately 130 mL.  FLUOROSCOPY TIME:  Fluoroscopy Time: 83 minutes 54 seconds (498 mGy).  COMPLICATIONS: None immediate.  TECHNIQUE: Following a full explanation of the procedure along with the potential associated complications, an informed witnessed consent was obtained from the patient's daughter. The risks of intracranial hemorrhage of 10%, worsening neurological deficit, ventilator dependency, death and inability to revascularize were all reviewed in detail with the patient's patient's daughter.  The patient was then put under general anesthesia by the Department of Anesthesiology at [facility].  The right groin was prepped and draped in the usual sterile fashion. Thereafter using modified Seldinger technique, transfemoral access into the right common femoral artery was obtained without difficulty. Over a 0.035 inch guidewire a 5 French Pinnacle sheath was inserted. Through this, and also over a 0.035 inch guidewire a 5 [place] JB 1 catheter was advanced to the aortic arch region and selectively positioned in the innominate artery, the left common carotid artery and the right common carotid artery.  FINDINGS: The innominate artery injection demonstrates significant tortuosity at the origin of the innominate artery, and also of the aortic arch.  The right subclavian artery demonstrates mild stenosis proximally. More distally, a hypoplastic right vertebral artery is seen opacifying to the cranial skull base.  The right common carotid arteriogram demonstrates approximately 50% stenosis of the right external carotid artery at its origin. Its branches opacify normally.  The right internal carotid artery demonstrates near complete occlusion of the right internal carotid artery  at the bulb with contrast in the proximal 1/3 of the right internal carotid artery associated with a calcified  plaque.  More distally, there is partial reconstitution of the right internal carotid artery cavernous and supraclinoid segment from the right external carotid artery branches into the right ophthalmic artery.  Delayed opacification of the right middle cerebral artery is seen. The left common carotid arteriogram demonstrates approximately 50% stenosis of the left common carotid artery proximal to the bifurcation. The left external carotid artery demonstrates a 50% stenosis at its origin. Its branches opacify normally, otherwise.  The left internal carotid artery at the bulb demonstrates severe approximately 90-95% stenosis secondary to a circumferential plaque. More distally, the vessel is seen to opacify to the cranial skull base. The petrous, cavernous and supraclinoid segments demonstrate wide patency.  The left middle cerebral artery and the left anterior cerebral artery opacify into the capillary and venous phases.  Prompt cross filling via the anterior communicating artery of the right anterior cerebral A2 segment and distally is seen from the left internal carotid artery injection.  PROCEDURE: The diagnostic JB 1 catheter in the right common carotid artery was exchanged over a 0.035 inch 300 cm Rosen exchange guidewire for an 8 French 55 cm Brite tip neurovascular sheath. Good aspiration obtained from the side port of the neurovascular sheath. This was then connected to continuous heparinized saline infusion. Over the exchange guidewire, an 8 [place] 85 cm FlowGate guide catheter which had been prepped with 50% contrast and 50% heparinized saline infusion was advanced and positioned just proximal to the right common carotid bifurcation. The guidewire was removed good aspiration obtained from the hub of the [facility] guide catheter. A gentle control arteriogram demonstrates safe position without evidence of intraluminal filling defects or of vasospasm.  An 021 Trevo ProVue microcatheter was then advanced  over a 0.014 inch Softip Synchro micro guidewire to the distal end of the [facility] guide catheter.  Using a torque device, access through the occluded right internal carotid artery was obtained with the micro guidewire followed by the microcatheter which was advanced to the first horizontal segment of the petrous right ICA. The guidewire was removed. Good aspiration obtained from the hub of the microcatheter. A gentle control arteriogram performed through the microcatheter demonstrated patency of the right internal carotid artery distal petrous, cavernous and supraclinoid segments. Right middle cerebral artery opacification was seen with occlusion distal to the origin of the anterior temporal branch. Right anterior cerebral artery remained patent.  At this time, a 4 mm x 30 mm and 014 inch Viatrac balloon which had been prepped with 50% contrast and 50% heparinized saline infusion was advanced to the site of the occlusion using the rapid exchange technique.  However, despite multiple attempts and manipulation of the 8 [place] FlowGate guide catheter including expansion of the balloon and for anchorage, access through the occluded right internal carotid artery could not be obtained with the balloon.  This was then removed.  This was then followed by the advancement of the microcatheter to the petrous right ICA. The exchange micro guidewire was removed. Through the microcatheter, and over a 0.014 inch Softip Synchro micro guidewire, and then an 016 inch Headliner micro guidewire, access into the right middle cerebral artery M2 M3 region inferior division could be obtained, however, advancement of the microcatheter was met with persistent herniation secondary to the displacement of the [facility] guide catheter secondary to severe tortuosity. Mechanical thrombolysis with the micro guidewire was then performed in the  hope to macerate the clot in the right middle cerebral artery distal M1 segment.  A control  arteriogram performed following this continued to demonstrate occluded right internal carotid artery at the bulb.  Over the exchange guidewire, another attempt was made using a 3 mm x 15 mm Gateway balloon guide catheter using the over the wire technique. Again this was met with significant resistance to the advancement of the angioplasty microcatheter.  Two angioplasties were performed in the proximal portion of the occluded right internal carotid at the bulb. Again following these angioplasties, there was improved blood noted into the right internal carotid artery distally and intracranially.  Further attempts at advancing the angioplasty balloon guide catheter through this again was met with unsuccessful advancement through the hard plaque in the right internal carotid artery at the bulb.  It was, therefore, decided to stop. Throughout the procedure, the patient's blood pressure and neurological status remained stable. Patient was loaded with 180 mg of Brilinta, and 81 mg of aspirin prior to the treatment via an orogastric tube.  The 8 [place] FlowGate guide catheter and the 8 [place] neurovascular sheath were then retrieved into the abdominal aorta and exchanged over a J-tip guidewire for an 8 [place] Pinnacle sheath. This in turn was then removed with the successful placement and closure using a 7 [place] ExoSeal closure device. The right groin appeared soft without evidence of a hematoma or bleeding. Distal pulses remained palpable in the dorsalis pedis, and posterior tibial regions bilaterally at the end of procedure unchanged.  CT of the brain obtained demonstrated no evidence of hemorrhage, or mass effect or midline shift.  Patient was left intubated and transferred to the ICU for further post treatment management.  IMPRESSION: Status post endovascular mechanical thrombolysis of the right middle cerebral artery M1 segment with micro guidewire as described.  Balloon angioplasty x 2 of the symptomatic  acute occlusion of the right internal carotid artery at the bulb.  PLAN: Follow-up point in clinic 4 weeks post discharge.   Electronically Signed   By: Luanne Bras M.D.   On: 04/07/2018 12:29  Scans on Order [id number] Scan on 04/05/2018 1:26 AM by Armando Reichert, RT    Labs:  Basic Metabolic Panel: No results for input(s): NA, K, CL, CO2, GLUCOSE, BUN, CREATININE, CALCIUM, MG, PHOS in the last 168 hours.  CBC: No results for input(s): WBC, NEUTROABS, HGB, HCT, MCV, PLT in the last 168 hours.  CBG: Recent Labs  Lab 05/02/18 2115 05/03/18 0656 05/03/18 1200 05/03/18 1646 05/03/18 2137  GLUCAP 198* 126* 152* 190* 173*   Family history.  Mother with arthritis as well as depression.  Father with CAD as well as hypertension.  Denies any cancer or diabetes  Brief HPI:    Vittoria Noreen is an 83 year old right-handed female with history of hypertension, hyperlipidemia, diabetes mellitus, bilateral total knee replacement.  Lives alone independent prior to admission.  Performed her own ADLs.  She does not drive.  Good support of local family.  Presented for 01/22/2018 after a fall with left-sided weakness no loss of consciousness.  Cranial CT scan showed asymmetric hyperattenuation right MCA.  CT angiogram of head and neck showed occluded right ICA with trace reconstitution.  Critical stenosis left ICA origin.  Severe stenosis of bilateral vertebral artery origins, patent vertebral arteries.  CT perfusion scan showed a large area right MCA penumbra with 9 mL right basal ganglia and possible temporal lobe cortical infarction.  MRI brain reviewed showing bilateral infarcts, moderate  to large right MCA infarction.  Attempts at mechanical thrombectomy unsuccessful.  Echocardiogram with ejection fraction 65% hyperdynamic systolic function no source of embolus.  Neurology consulted maintain on aspirin and Plavix x3 months then Plavix alone.  Hospital course dysphagia #1 nectar thick liquid.   Patient was admitted for a comprehensive rehab program.  Hospital Course: Sinclair Arrazola was admitted to rehab 04/07/2018 for inpatient therapies to consist of PT, ST and OT at least three hours five days a week. Past admission physiatrist, therapy team and rehab RN have worked together to provide customized collaborative inpatient rehab.  Pertaining to patient right MCA infarction in the setting of right ICA and right MCA occlusion attempts at thrombectomy unsuccessful patient would follow-up with both neurology services as well as interventional radiology if needed.  She remained on aspirin and Plavix therapy as advised x3 months then Plavix alone.  SCDs for DVT prophylaxis.  Pain management with occasional Tylenol 3 for headache.  She did have some spasticity received injection to the left bicep in the left hamstring.  She did appear to have some neuropathic pain with Neurontin adjusted to 400 mg 3 times daily.  Blood pressure control with Norvasc 5 mg daily, Coreg 12.5 mg p.o. twice daily as well as lisinopril and would follow-up with primary MD.  Mood stabilization with Wellbutrin as well as Lexapro initially with limited attention does did continue to improve she was attending therapies.  She remained on a dysphagia #1 honey thick liquid diet with close monitoring of hydration followed by speech therapy.  Hemoglobin A1c of 8 maintained on Amaryl 1 mg daily as well as Lantus insulin with full diabetic teaching.  Hypothyroidism remained on supplement with Synthroid.  Hyperlipidemia with Lipitor ongoing.  Bouts of constipation resolved with laxative assistance.  Physical exam.  Blood pressure 137/65 pulse 69 temperature 98.2 respirations 17 oxygen saturations 98% room air. Constitutional.  Well-developed no acute distress HEENT Head.  Normocephalic and atraumatic Eyes.  EOMs normal no discharge without nystagmus Neck.  Supple nontender normal range of motion without JVD or thyromegaly Cardiovascular  normal rate and rhythm without gallop or murmur heard Respiratory effort normal without rales or rhonchi GI.  Soft bowel sounds normal nontender without rebound Musculoskeletal.  No edema or tenderness in extremities.  Patient alert left-sided neglect follow commands fair insight to deficits she was dysarthric.  Patient with significant left upper left lower extremity weakness 0 out of 5 proximal to distal.  Rehab course: During patient's stay in rehab weekly team conferences were held to monitor patient's progress, set goals and discuss barriers to discharge. At admission, patient required max assist supine to sit, max assist sit to supine.  Max assist stand pivot transfers with +2 safety and equipment max assist general transfers.  Minimal assist for grooming, moderate assist upper body bathing max assist lower body bathing moderate assist upper body dressing +2 physical assist lower body dressing total assist for toilet transfers.  She  has had improvement in activity tolerance, balance, postural control as well as ability to compensate for deficits. She has had improvement in functional use RUE/LUE  and RLE/LLE as well as improvement in awareness.  Patient supine to sit transfers with max assist and max cues for postural control and midline awareness.  Total assist squat pivot transfers to wheelchair with bobath technique and max cues for weightbearing over the right lower extremity.  Sitting balance and wheelchair x20 minutes with no rail to support left side.  Trunk control for  forward weight shifting and right weight shifting also performed with moderate assist.  Squat pivot transfers to bed with total assist on the right.  Total assist for sitting balance initially as patient not able to follow one-step commands to reposition hips in order to reduce pushing sensation.  Patient rolls left to right max assist total assist to don lower body clothing items and hygiene.  Patient could don pullover shirt  with moderate assist.  Speech therapy continue to follow for dysphasia.  Working with sustaining attention to basic problem-solving tasks.  Family teaching was completed and plan discharged to home       Disposition: Discharge disposition: 01-Home or Self Care     Discharge to home   Diet: As directed  Special Instructions: No smoking driving or alcohol  Continue aspirin and Plavix x3 months then Plavix alone  Medications at discharge. 1.  Tylenol as needed 2.  Norvasc 5 mg p.o. daily 3.  Aspirin 81 mg p.o. daily 4.  Lipitor 10 mg p.o. nightly 5.  Wellbutrin 100 mg p.o. 3 times daily 6.  Coreg 12.5 mg p.o. twice daily 7.  Plavix 75 mg p.o. daily 8.  Lexapro 5 mg p.o. nightly 9.  Neurontin 400 mg p.o. 3 times daily 10.  Amaryl 1 mg p.o. daily 11.  Hydrocodone 1 tablet every 6 hours as needed severe pain or headache 12.  Atarax 50 mg p.o. nightly 13.  Lantus insulin 12 units nightly 14.  Synthroid 62.5 mcg p.o. daily 15.  Lisinopril 40 mg p.o. daily 16.  Robaxin 500 mg every 8 hours as needed pain 17.  Senokot S1 tablet p.o. nightly  Discharge Instructions    Ambulatory referral to Neurology   Complete by:  As directed    An appointment is requested in approximately 4 weeks right MCA infarction   Ambulatory referral to Physical Medicine Rehab   Complete by:  As directed    Moderate complexity follow-up 1-2 weeks right MCA infarction      Follow-up Information    Kirsteins, Luanna Salk, MD Follow up.   Specialty:  Physical Medicine and Rehabilitation Why:  office to call for appointment Contact information: [place] [address] [phone]        Debbrah Alar, NP Follow up on 05/14/2018.   Specialty:  Internal Medicine Why:  @ 9:20 AM for a virtual visit - They will call you with instructions. Contact information: [place] [address] [phone]        Garvin Fila, MD Follow up.    Specialties:  Neurology, Radiology Why:  [facility] will call you to schedule and appointment which should be around 4 weeks from discharge from [facility]. Contact information: [address] [place] [address] [phone]           Signed: Lavon Paganini [place] 05/04/2018, 6:09 AM

## 2018-05-03 NOTE — Discharge Instructions (Signed)
Inpatient Rehab Discharge Instructions  Jessica SaxonDoris Ann Meyer Discharge date and time: No discharge date for patient encounter.   Activities/Precautions/ Functional Status: Activity: activity as tolerated Diet:  Wound Care: none needed Functional status:  ___ No restrictions     ___ Walk up steps independently ___ 24/7 supervision/assistance   ___ Walk up steps with assistance ___ Intermittent supervision/assistance  ___ Bathe/dress independently ___ Walk with walker     _x STROKE/TIA DISCHARGE INSTRUCTIONS SMOKING Cigarette smoking nearly doubles your risk of having a stroke & is the single most alterable risk factor  If you smoke or have smoked in the last 12 months, you are advised to quit smoking for your health.  Most of the excess cardiovascular risk related to smoking disappears within a year of stopping.  Ask you doctor about anti-smoking medications  [place] Quit Line: 1-800-QUIT NOW  Free Smoking Cessation Classes [phone]  CHOLESTEROL Know your levels; limit fat & cholesterol in your diet  Lipid Panel     Component Value Date/Time   CHOL 131 04/05/2018 0412   TRIG 130 04/05/2018 0412   HDL 42 04/05/2018 0412   CHOLHDL 3.1 04/05/2018 0412   VLDL 26 04/05/2018 0412   LDLCALC 63 04/05/2018 0412      Many patients benefit from treatment even if their cholesterol is at goal.  Goal: Total Cholesterol (CHOL) less than 160  Goal:  Triglycerides (TRIG) less than 150  Goal:  HDL greater than 40  Goal:  LDL (LDLCALC) less than 100   BLOOD PRESSURE American Stroke Association blood pressure target is less that 120/80 mm/Hg  Your discharge blood pressure is:  BP: (!) 136/55  Monitor your blood pressure  Limit your salt and alcohol intake  Many individuals will require more than one medication for high blood pressure  DIABETES (A1c is a blood sugar average for last 3 months) Goal HGBA1c is under 7% (HBGA1c is blood sugar average for last 3 months)  Diabetes:     Lab  Results  Component Value Date   HGBA1C 8.0 (H) 04/05/2018     Your HGBA1c can be lowered with medications, healthy diet, and exercise.  Check your blood sugar as directed by your physician  Call your physician if you experience unexplained or low blood sugars.  PHYSICAL ACTIVITY/REHABILITATION Goal is 30 minutes at least 4 days per week  Activity: Increase activity slowly, Therapies: Physical Therapy: Home Health Return to work:   Activity decreases your risk of heart attack and stroke and makes your heart stronger.  It helps control your weight and blood pressure; helps you relax and can improve your mood.  Participate in a regular exercise program.  Talk with your doctor about the best form of exercise for you (dancing, walking, swimming, cycling).  DIET/WEIGHT Goal is to maintain a healthy weight  Your discharge diet is:  Diet Order            DIET - DYS 1 Room service appropriate? Yes; Fluid consistency: Nectar Thick  Diet effective now              liquids Your height is:  Height: 5\' 5"  (165.1 cm) Your current weight is: Weight: 66.2 kg Your Body Mass Index (BMI) is:  BMI (Calculated): 24.29  Following the type of diet specifically designed for you will help prevent another stroke.  Your goal weight range is:    Your goal Body Mass Index (BMI) is 19-24.  Healthy food habits can help reduce 3 risk  factors for stroke:  High cholesterol, hypertension, and excess weight.  RESOURCES Stroke/Support Group:  Call [phone]   STROKE EDUCATION PROVIDED/REVIEWED AND GIVEN TO PATIENT Stroke warning signs and symptoms How to activate emergency medical system (call 911). Medications prescribed at discharge. Need for follow-up after discharge. Personal risk factors for stroke. Pneumonia vaccine given:  Flu vaccine given:  My questions have been answered, the writing is legible, and I understand these instructions.  I will adhere to these goals & educational materials that  have been provided to me after my discharge from the hospital.   __ Bathe/dress with assistance ___ Walk Independently    ___ Shower independently ___ Walk with assistance    ___ Shower with assistance ___ No alcohol     ___ Return to work/school ________  COMMUNITY REFERRALS UPON DISCHARGE:   Home Health:   PT     OT     ST     RN     SNA     Agency:  [facility] [employer]:  [phone] Medical Equipment/Items Ordered:  Michiel Sites lift with medium U-sling; hospital bed; 30" slide board  Agency/Supplier:  [employer]      Phone:  [phone] Other:  [employer] - for specialty wheelchair   Josh Cadle - [phone]) [phone](m); [phone] (o)  GENERAL COMMUNITY RESOURCES FOR PATIENT/FAMILY: Support Groups:  [place] [facility] - on hold for COVID-19                              Meets the second Thursday of each month from 6 - 7 PM (except June, July, August)                              [facility] [place], [address], [facility] Dayroom                              For more information, call [phone]  Special Instructions:  Aspirin and Plavix  X 3 months then Plavix alone  My questions have been answered and I understand these instructions. I will adhere to these goals and the provided educational materials after my discharge from the hospital.  Patient/Caregiver Signature _______________________________ Date __________  Clinician Signature _______________________________________ Date __________  Please bring this form and your medication list with you to all your follow-up doctor's appointments.

## 2018-05-03 NOTE — Progress Notes (Signed)
[place] PHYSICAL MEDICINE & REHABILITATION PROGRESS NOTE  Subjective/Complaints: Still with LUE and LLE pain,improved.  Additional family ed today ROS: Patient denies CP, SOB, N/V/D    Objective: Vital Signs: Blood pressure 130/64, pulse 68, temperature 97.7 F (36.5 C), temperature source Oral, resp. rate 17, height 5\' 5"  (1.651 m), weight 66.3 kg, SpO2 98 %. No results found. No results for input(s): WBC, HGB, HCT, PLT in the last 72 hours. No results for input(s): NA, K, CL, CO2, GLUCOSE, BUN, CREATININE, CALCIUM in the last 72 hours.  Physical Exam: BP 130/64   Pulse 68   Temp 97.7 F (36.5 C) (Oral)   Resp 17   Ht 5\' 5"  (1.651 m)   Wt 66.3 kg   SpO2 98%   BMI 24.33 kg/m  Constitutional: No distress . Vital signs reviewed. HEENT: EOMI, oral membranes moist Neck: supple Cardiovascular: RRR without murmur. No JVD    Respiratory: CTA Bilaterally without wheezes or rales. Normal effort    GI: BS +, non-tender, non-distended  Musculoskeletal: right trap tender? Neurological: She is alert Left-sided neglect   Follows commands.    Poor insight into deficits Dysarthria Motor: LLE: 0/5 proximal to distal 0/5 left upper extremity unchanged RLE: 5/5 proximal to distal Sensation absent to light touch LLE  Poor attention and concentration Increased ankle plantar flexor tone MAS 1 Left elbow flexors and finger flexors , MAS 2 in Left knee flexors -Less pain with ROM of LUE and LLE Skin: Skin is warm and dry.Psychiatric: flat   Assessment/Plan: 1. Functional deficits secondary to right MCA infarct which require 3+ hours per day of interdisciplinary therapy in a comprehensive inpatient rehab setting.  Physiatrist is providing close team supervision and 24 hour management of active medical problems listed below.  Physiatrist and rehab team continue to assess barriers to discharge/monitor patient progress toward functional and medical goals  Care Tool:  Bathing  Bathing activity did not occur: Safety/medical concerns Body parts bathed by patient: Face, Right upper leg   Body parts bathed by helper: Right arm, Left arm, Chest, Abdomen, Front perineal area, Buttocks, Left upper leg, Right lower leg, Left lower leg Body parts n/a: Front perineal area, Buttocks(pt already cleansed by nursing)   Bathing assist Assist Level: Total Assistance - Patient < 25%     Upper Body Dressing/Undressing Upper body dressing   What is the patient wearing?: Pull over shirt    Upper body assist Assist Level: Total Assistance - Patient < 25%    Lower Body Dressing/Undressing Lower body dressing    Lower body dressing activity did not occur: Safety/medical concerns What is the patient wearing?: Pants, Incontinence brief     Lower body assist Assist for lower body dressing: 2 Helpers     Toileting Toileting    Toileting assist Assist for toileting: 2 Helpers     Transfers Chair/bed transfer  Transfers assist  Chair/bed transfer activity did not occur: Safety/medical concerns  Chair/bed transfer assist level: Dependent - mechanical lift     Locomotion Ambulation   Ambulation assist   Ambulation activity did not occur: Safety/medical concerns  Assist level: 2 helpers Assistive device: No Device Max distance: 30'   Walk 10 feet activity   Assist  Walk 10 feet activity did not occur: Safety/medical concerns  Assist level: 2 helpers     Walk 50 feet activity   Assist Walk 50 feet with 2 turns activity did not occur: Safety/medical concerns  Walk 150 feet activity   Assist Walk 150 feet activity did not occur: Safety/medical concerns         Walk 10 feet on uneven surface  activity   Assist Walk 10 feet on uneven surfaces activity did not occur: Safety/medical concerns         Wheelchair     Assist Will patient use wheelchair at discharge?: Yes Type of Wheelchair: Manual    Wheelchair assist level:  Dependent - Patient 0% Max wheelchair distance: 150 ft    Wheelchair 50 feet with 2 turns activity    Assist        Assist Level: Dependent - Patient 0%   Wheelchair 150 feet activity     Assist     Assist Level: Dependent - Patient 0%      Medical Problem List and Plan: 1.  Left side weakness with dysphagia secondary to right MCA infarction in the setting of right ICA and right MCA occlusion status post unsuccessful attempts at thrombectomy. CIR PT, OT, SLP.   Spasticity s/p Dysport injection 500U to L bieps and to L hamstring, expect effect ~4/30 D/C in am 2.  Antithrombotics: -DVT/anticoagulation:  SCDs             -antiplatelet therapy: aspirin 81 mg daily, Plavix 75 mg daily 3. Pain Management:  Tylenol 3 for headache as needed, flank pain has subsided, right-sided neck pain is the current complaint. Xray confirmed cervical spondylosis +/- DDD exacerbated by neck positioning from Left neglect Kpad , sportscreme Robaxin  hydrocodone-Positional challenges pt keeps head turned toward the Left Neurogenic pain stroke, no improvement will increase gabapentin to 400mg TID-monitor sedation  4. Mood: Wellbutrin 300 mg daily,Lexapro 5 mg daily at bedtime, poor attention             -antipsychotic agents: N/A 5. Neuropsych: This patient is not fully capable of making decisions on her own behalf. 6. Skin/Wound Care:  Routine skin checks 7. Fluids/Electrolytes/Nutrition:  encourage PO, variable 8. Dysphagia. Dysphagia #1 honey liquids.  Follow-up speech therapy             Advance diet as tolerated- per SLP 9. Hypertension. Norvasc 5 mg daily,lisinopril 40 mg daily, Coreg 12.5 mg twice a day.    Vitals:   05/03/18 0530 05/03/18 0816  BP: 106/62 130/64  Pulse: 72 68  Resp: 17   Temp: 97.7 F (36.5 C)   SpO2: 98%   Controlled 4/30 10. Diabetes mellitus. Hemoglobin A1c 8.0. Glucotrol XL 2.5 mg daily, Lantus insulin 12 units daily at bedtime. Check blood sugars before meals  and at bedtime.      CBG (last 3)  Recent Labs    05/02/18 1628 05/02/18 2115 05/03/18 0656  GLUCAP 134* 198* 126*  amaryl increased to 2mg  daily 4/18, controlled 4/30 11. Hyperlipidemia. Lipitor 12. Hypothyroidism. Continue Synthroid 13.  Acute blood loss anemia:              Hemoglobin 9.3 on 4/4 follow-up 10.8 on 4/13 14.  CKD stage III:             Creatinine 1.11 on 4/4. 1.22 on 4/6  stable      LOS: 26 days A FACE TO FACE EVALUATION WAS PERFORMED  Erick Colace 05/03/2018, 8:45 AM

## 2018-05-03 NOTE — Plan of Care (Signed)
Nutrition Education Note  RD consulted for nutrition education regarding diabetes. RD asked to reach out to pt's daughter, Felicita Gage, via phone call to answer questions.  Reviewed Diabetes Coordinator note from yesterday, 4/30. Diabetes Coordinator was able to speak with Debbie via phone call and answer initial diabetes-related questions.  RD reached out to pt's daughter [name] this morning via phone call to provide basic diabetes diet education and answer nutrition-related questions.  Debbie reports that the pt "didn't eat good at all" PTA and that family member had "been on her for years to eat better." [name] reports that pt was independent PTA and fixed most of her own meals but would go out to eat with family on occasion.  Debbie reports that pt's typical daily intake included 3 meals and an occasional snack. Debbie states that pt was "a Country lady."  Breakfast: sausage and scrambled eggs Lunch: [place] sandwich and chicken noodle soup Dinner: neck bones, collards Snack: candy bar  Debbie reports that pt had one hypoglycemic episode years ago and that this was related to pt not eating much breakfast and eating a late lunch.  Debbie reports that pt follows closely with outpatient endocrinologist for medication management. [name] also states that pt will be living with her after discharge and requests assistance with meal planning as she has never prepared food for a diabetic before.  Debbie with specific questions regarding Dysphagia 1 vs Dysphagia 2 diet textures and which one pt would be eating at d/c. RD encouraged Debbie to reach out to ST with these questions.  Lab Results  Component Value Date   HGBA1C 8.0 (H) 04/05/2018    RD will mail  "Carbohydrate Counting for People with Diabetes" handout from the Academy of [employer] to Felicita Gage at the address [name] provided today.  Discussed different food groups and their effects on blood sugar, emphasizing  carbohydrate-containing foods. Provided list of carbohydrates and recommended serving sizes of common foods.  Discussed importance of controlled and consistent carbohydrate intake throughout the day. Provided examples of ways to balance meals/snacks and encouraged intake of high-fiber, whole grain complex carbohydrates.  Debbie with questions regarding beverage intake and whether thickeners would affect pt's blood sugar. Reviewed Thicken-up Clear nutrition facts and relayed this information to [place]. Carbohydrate content of thickeners are typically negligible.  Debbie with questions about snack options and whether she should provide snacks to pt between meals. Discussed importance of pt not going longer than 4 hours without having something to eat to avoid hypoglycemic episodes. Brainstormed snack options.  Teach back method used.  Expect good compliance.  Body mass index is 24.33 kg/m. Pt meets criteria for normal weight based on current BMI.  Current diet order is Dysphagia 1 with honey-thick liquids, patient is consuming approximately 75% of meals at this time. Labs and medications reviewed. No further nutrition interventions warranted at this time. RD contact information will be provided on the handout. If additional nutrition issues arise, please re-consult RD.   Earma Reading, MS, RD, LDN Inpatient Clinical Dietitian Pager: [phone] Weekend/After Hours: [phone]

## 2018-05-03 NOTE — Progress Notes (Signed)
Occupational Therapy Session Note  Patient Details  Name: Jessica Meyer MRN: [id number] Date of Birth: [birth year]  Today's Date: 05/03/2018 OT Individual Time: 1305-1350 OT Individual Time Calculation (min): 45 min    Short Term Goals: Week 3:  OT Short Term Goal 1 (Week 3): Pt will attend to L visual field to locate self care items with max cuing. OT Short Term Goal 2 (Week 3): Pt will perform UB dressing with mod A to decrease caregiver assistance.  Week 4:     Skilled Therapeutic Interventions/Progress Updates:    1:1 Focus on family education with pt and pt's daughter and her husband. Family education including demonstration and verbalization on LB bathing and dressing at bed level, bed mobility in hospital bed, placement of hoyer lift pad, transfer with manual hoyer lift into tilted back tilt in space w/c, education on w/c management, UB bathing and dressing at w/c level, safety at home, importance of routine at home and out of bed schedule, and participation in family roles. Hand out provided on hoyer lift.   Therapy Documentation Precautions:  Precautions Precautions: Fall Precaution Comments: left inattention and right gaze, increased extensor tone LLE and Flexion tone LUE at shoulder and elbow Restrictions Weight Bearing Restrictions: No Pain:  no c/o pain   Therapy/Group: Individual Therapy  Roney Mans [facility] 05/03/2018, 6:49 PM

## 2018-05-03 NOTE — Progress Notes (Signed)
Occupational Therapy Session Note  Patient Details  Name: Jessica Meyer MRN: [id number] Date of Birth: December 07, 1935  Today's Date: 05/03/2018 OT Individual Time: 1510-1610 OT Individual Time Calculation (min): 60 min    Skilled Therapeutic Interventions/Progress Updates: Patient lying bed but concurred to sit edge of bed with this clinician and rehab tech to work the following to work toward increased safety, cognition and functional status toward goals:  thorughout sessoin worked on left environmental and left upper extremity ttention and awareness, including crossing midline with right hand to maintain balance while identifhying and reaching for objects on tray table on her left  Orientation.   Patient quite confused as well as hard of hard  Supine to/fr edge of bed transfer= max to near total assist  Static balance and dynamic...poor and varied from close S to severe lateral and posterior leans.....required two people for balance most of the sitting unsupported portion and work towards neutral posture  Attempted posterior anterior tilts but patient too confused to participate.  Patient required max assist to participate in thoracic extension and trunk mobility left and right rotations to break up tone Once back in bed she tolerated right side lying for left scapular mobilization.  Scapula mobilized.   Tight muscles in latts noted.  Patient was left sitting upright in bed with daughter assisting her with drinking water.   Her son in law stood by watching and learning  Bed alarm was engaged before this clinician exited the room.  Continue OT Plan of Care as patient's dtr stated she will discharge very soon.        Therapy Documentation Precautions:  Precautions Precautions: Fall Precaution Comments: left inattention and right gaze, increased extensor tone LLE and Flexion tone LUE at shoulder and elbow Restrictions Weight Bearing Restrictions: No  Pain:in left hand.  dtr  stated and reminded patient had just been medicated prior to this session.    Therapy/Group: Individual Therapy  Rozelle Logan 05/03/2018, 6:11 PM

## 2018-05-04 ENCOUNTER — Telehealth: Payer: Self-pay | Admitting: Family

## 2018-05-04 ENCOUNTER — Inpatient Hospital Stay (HOSPITAL_COMMUNITY): Payer: Medicare Other | Admitting: Occupational Therapy

## 2018-05-04 ENCOUNTER — Inpatient Hospital Stay (HOSPITAL_COMMUNITY): Payer: Medicare Other | Admitting: Speech Pathology

## 2018-05-04 ENCOUNTER — Inpatient Hospital Stay (HOSPITAL_COMMUNITY): Payer: Medicare Other | Admitting: Physical Therapy

## 2018-05-04 LAB — GLUCOSE, CAPILLARY
Glucose-Capillary: 119 mg/dL — ABNORMAL HIGH (ref 70–99)
Glucose-Capillary: 301 mg/dL — ABNORMAL HIGH (ref 70–99)
Glucose-Capillary: 226 mg/dL — ABNORMAL HIGH (ref 70–99)
Glucose-Capillary: 153 mg/dL — ABNORMAL HIGH (ref 70–99)

## 2018-05-04 NOTE — Plan of Care (Signed)
2/10 LTGs achieved. Please see OT POC for details

## 2018-05-04 NOTE — Progress Notes (Signed)
Physical Therapy Discharge Summary  Patient Details  Name: Jessica Meyer MRN: [id number] Date of Birth: 31-Oct-1935  Today's Date: 05/04/2018 Jessica Meyer Individual Time: 5859-2924   45 minutes.    Patient has met 1 of 8 long term goals due to continued perceptual, neuromotor, cognitive deficits that limited improved safety of increased independence with functional mobility .  Patient to discharge at a wheelchair level Total Assist.   Patient's care partner is independent to provide the necessary physical and cognitive assistance at discharge.  Reasons goals not met: Jessica Meyer made only slight progress toward LTG due to awareness deficit and lack of insight to all physical deficits, which prevented improve mobility.   Recommendation:  Patient will benefit from ongoing skilled Jessica Meyer services in home health setting to continue to advance safe functional mobility, address ongoing impairments in balance, awareness, transfers, bed mobility, perception, attention to the L, and minimize fall risk.  Equipment: TIS WC. SB.   Reasons for discharge: lack of progress toward goals and discharge from hospital  Patient/family agrees with progress made and goals achieved: Yes   Jessica Meyer treatment:  Jessica Meyer instructed Jessica Meyer in Grad day assessment to measure progress toward goals. See below for details. Sitting balance EOB x 74mnutes with max assist overal with intermittent mod assist with multimodal cues for midline orientation. SB transfer x 3 throughout treatment with total + 2 assist for safety to the R and L. Jessica Meyer returned to room and performed SB transfer as listed below with total assist. Sit>supine completed with max assist, and left supine in bed with call bell in reach and all needs met.      Jessica Meyer Discharge Precautions/Restrictions   Vital Signs Therapy Vitals Temp: 98 F (36.7 C) Temp Source: Oral Pulse Rate: 66 Resp: 19 BP: 124/61 Patient Position (if appropriate): Lying Oxygen Therapy SpO2: 98 % O2 Device: Room  Air Pain   denies at rest Vision/Perception  Vision - Assessment Ocular Range of Motion: Restricted on the left Alignment/Gaze Preference: Gaze right Perception Inattention/Neglect: Does not attend to left visual field;Does not attend to left side of body Praxis Praxis: Impaired Praxis Impairment Details: Initiation;Motor planning;Perseveration  Cognition Overall Cognitive Status: Impaired/Different from baseline Arousal/Alertness: Awake/alert Orientation Level: Oriented to person;Oriented to place;Oriented to situation Attention: Sustained Sustained Attention: Impaired Sustained Attention Impairment: Verbal basic;Functional basic Memory: Impaired Memory Impairment: Decreased short term memory;Decreased recall of new information;Retrieval deficit Decreased Short Term Memory: Verbal basic;Functional basic Awareness: Impaired Awareness Impairment: Intellectual impairment Problem Solving: Impaired Problem Solving Impairment: Verbal basic;Functional basic Executive Function: (all are impaired d/t lower level deficits) Safety/Judgment: Impaired Comments: Jessica Meyer to lack insight of deficits Sensation Sensation Light Touch: Impaired Detail(Hypersensitive to touch, painful. Difficult to formally assess due to cognitive deficits) Coordination Gross Motor Movements are Fluid and Coordinated: No Fine Motor Movements are Fluid and Coordinated: No Coordination and Movement Description: Affected by Lt hemiplegia and pushing tendencies Motor  Motor Motor: Hemiplegia;Abnormal tone;Abnormal postural alignment and control;Motor impersistence Motor - Discharge Observations: severe L hemiplegia pushers syndrome persists.   Mobility Bed Mobility Bed Mobility: Rolling Right;Rolling Left;Supine to Sit;Sit to Supine Rolling Right: Maximal Assistance - Patient 25-49% Rolling Left: Maximal Assistance - Patient 25-49% Supine to Sit: Maximal Assistance - Patient - Patient 25-49% Sit to  Supine: Maximal Assistance - Patient 25-49% Transfers Transfers: Sit to Stand;Stand to Sit Sit to Stand: 2 Helpers Stand to Sit: 2 Helpers Transfer (Assistive device): None Locomotion  Gait Ambulation: No Gait Gait: No Stairs / Additional Locomotion Stairs: No Wheelchair  Mobility Wheelchair Mobility: Yes Wheelchair Assistance: Dependent - Patient 0%(Tilt in space w/c) Wheelchair Parts Management: Needs assistance  Trunk/Postural Assessment  Cervical Assessment Cervical Assessment: Exceptions to [facility] Thoracic Assessment Thoracic Assessment: Exceptions to [facility] Lumbar Assessment Lumbar Assessment: Exceptions to [facility] Postural Control Postural Control: Deficits on evaluation Trunk Control: Lt pushing tendencies, trunk extensor tone noted when EOB Righting Reactions: absent   Balance Balance Balance Assessed: Yes Dynamic Sitting Balance Dynamic Sitting - Balance Support: Feet supported(During functional transfers) Dynamic Sitting - Level of Assistance: 2: Max assist Extremity Assessment      RLE Assessment RLE Assessment: Within Functional Limits LLE Assessment LLE Assessment: Exceptions to [facility] Passive Range of Motion (PROM) Comments: limited DF, unable to come to neutral passively Active Range of Motion (AROM) Comments: impaired secondary to weakness and tone General Strength Comments: unable to formally assess secondary to extensor tone LLE Tone LLE Tone Comments: significant L LE extensor tone    Lorie Phenix 05/05/2018, 8:08 AM

## 2018-05-04 NOTE — Plan of Care (Signed)
  Problem: Consults Goal: RH STROKE PATIENT EDUCATION Description See Patient Education module for education specifics  Outcome: Progressing Goal: Nutrition Consult-if indicated Outcome: Progressing Goal: Diabetes Guidelines if Diabetic/Glucose > 140 Description If diabetic or lab glucose is > 140 mg/dl - Initiate Diabetes/Hyperglycemia Guidelines & Document Interventions  Outcome: Progressing   Problem: RH BOWEL ELIMINATION Goal: RH STG MANAGE BOWEL WITH ASSISTANCE Description STG Manage Bowel with mod Assistance.  Outcome: Progressing Goal: RH STG MANAGE BOWEL W/MEDICATION W/ASSISTANCE Description STG Manage Bowel with Medication with mod  Assistance.  Outcome: Progressing   Problem: RH BLADDER ELIMINATION Goal: RH STG MANAGE BLADDER WITH ASSISTANCE Description STG Manage Bladder With mod Assistance  Outcome: Progressing   Problem: RH SKIN INTEGRITY Goal: RH STG SKIN FREE OF INFECTION/BREAKDOWN Description Skin free from infection entire stay on rehab  Outcome: Progressing   Problem: RH SAFETY Goal: RH STG ADHERE TO SAFETY PRECAUTIONS W/ASSISTANCE/DEVICE Description STG Adhere to Safety Precautions With mod  Assistance/Device.  Outcome: Progressing   Problem: RH PAIN MANAGEMENT Goal: RH STG PAIN MANAGED AT OR BELOW PT'S PAIN GOAL Description Pain less than 2  Outcome: Progressing   Problem: RH KNOWLEDGE DEFICIT Goal: RH STG INCREASE KNOWLEDGE OF DIABETES Description Increase knowledge on DM mod I  Outcome: Progressing Goal: RH STG INCREASE KNOWLEDGE OF HYPERTENSION Description Increase knowledge on hypertension mod I  Outcome: Progressing Goal: RH STG INCREASE KNOWLEDGE OF DYSPHAGIA/FLUID INTAKE Description Increase knowledge on dysphagia/ fluid intake mod assistance  Outcome: Progressing Goal: RH STG INCREASE KNOWLEGDE OF HYPERLIPIDEMIA Description Increase knowledge on hyperlipedemia moderate assistance  Outcome: Progressing Goal: RH STG INCREASE  KNOWLEDGE OF STROKE PROPHYLAXIS Description Increase knowledge on stroke prophylaxis moderate assistance  Outcome: Progressing

## 2018-05-04 NOTE — Progress Notes (Addendum)
[place] PHYSICAL MEDICINE & REHABILITATION PROGRESS NOTE  Subjective/Complaints: No c/o LUE and LLE pain, ROS: Patient denies CP, SOB, N/V/D    Objective: Vital Signs: Blood pressure 101/77, pulse 64, temperature 97.7 F (36.5 C), temperature source Oral, resp. rate 18, height 5\' 5"  (1.651 m), weight 66.3 kg, SpO2 100 %. No results found. No results for input(s): WBC, HGB, HCT, PLT in the last 72 hours. No results for input(s): NA, K, CL, CO2, GLUCOSE, BUN, CREATININE, CALCIUM in the last 72 hours.  Physical Exam: BP 101/77 (BP Location: Right Arm)   Pulse 64   Temp 97.7 F (36.5 C) (Oral)   Resp 18   Ht 5\' 5"  (1.651 m)   Wt 66.3 kg   SpO2 100%   BMI 24.33 kg/m  Constitutional: No distress . Vital signs reviewed. HEENT: EOMI, oral membranes moist Neck: supple Cardiovascular: RRR without murmur. No JVD    Respiratory: CTA Bilaterally without wheezes or rales. Normal effort    GI: BS +, non-tender, non-distended  Musculoskeletal: right trap tender? Neurological: She is alert Left-sided neglect   Follows commands.    Poor insight into deficits Dysarthria Motor: LLE: 0/5 proximal to distal 0/5 left upper extremity unchanged RLE: 5/5 proximal to distal Sensation absent to light touch LLE  Poor attention and concentration Increased ankle plantar flexor tone MAS 1 Left elbow flexors and finger flexors , MAS 1 in Left knee flexors -Less pain with ROM of LUE and LLE Skin: Skin is warm and dry.Psychiatric: flat   Assessment/Plan:  1. Functional deficits secondary to right MCA infarct  Stable for D/C today F/u PCP in 3-4 weeks F/u PM&R 2 weeks See D/C summary  See D/C instructions  Assessment of Mobilty needs for Discharge- requiring tilt in space WC due to severe pushers syndrome and poor sitting balance Agree with PT Evaluation  Care Tool:  Bathing  Bathing activity did not occur: Safety/medical concerns Body parts bathed by patient: Face, Right upper leg    Body parts bathed by helper: Right arm, Left arm, Chest, Abdomen, Front perineal area, Buttocks, Left upper leg, Right lower leg, Left lower leg Body parts n/a: Front perineal area, Buttocks(pt already cleansed by nursing)   Bathing assist Assist Level: Total Assistance - Patient < 25%     Upper Body Dressing/Undressing Upper body dressing   What is the patient wearing?: Pull over shirt    Upper body assist Assist Level: Total Assistance - Patient < 25%    Lower Body Dressing/Undressing Lower body dressing    Lower body dressing activity did not occur: Safety/medical concerns What is the patient wearing?: Pants, Incontinence brief     Lower body assist Assist for lower body dressing: 2 Helpers     Toileting Toileting    Toileting assist Assist for toileting: 2 Helpers     Transfers Chair/bed transfer  Transfers assist  Chair/bed transfer activity did not occur: Safety/medical concerns  Chair/bed transfer assist level: 2 Helpers     Locomotion Ambulation   Ambulation assist   Ambulation activity did not occur: Safety/medical concerns  Assist level: 2 helpers Assistive device: No Device Max distance: 30'   Walk 10 feet activity   Assist  Walk 10 feet activity did not occur: Safety/medical concerns  Assist level: 2 helpers     Walk 50 feet activity   Assist Walk 50 feet with 2 turns activity did not occur: Safety/medical concerns         Walk 150 feet activity  Assist Walk 150 feet activity did not occur: Safety/medical concerns         Walk 10 feet on uneven surface  activity   Assist Walk 10 feet on uneven surfaces activity did not occur: Safety/medical concerns         Wheelchair     Assist Will patient use wheelchair at discharge?: Yes Type of Wheelchair: Manual    Wheelchair assist level: Dependent - Patient 0% Max wheelchair distance: 150 ft    Wheelchair 50 feet with 2 turns activity    Assist         Assist Level: Dependent - Patient 0%   Wheelchair 150 feet activity     Assist     Assist Level: Dependent - Patient 0%      Medical Problem List and Plan: 1.  Left side weakness with dysphagia secondary to right MCA infarction in the setting of right ICA and right MCA occlusion status post unsuccessful attempts at thrombectomy. CIR PT, OT, SLP.   Spasticity s/p Dysport injection 500U to L bieps and to L hamstring, expect effect ~4/30 D/C today 2.  Antithrombotics: -DVT/anticoagulation:  SCDs             -antiplatelet therapy: aspirin 81 mg daily, Plavix 75 mg daily 3. Pain Management:  Tylenol 3 for headache as needed, flank pain has subsided, right-sided neck pain is the current complaint. Xray confirmed cervical spondylosis +/- DDD exacerbated by neck positioning from Left neglect Kpad , sportscreme Robaxin  hydrocodone-Positional challenges pt keeps head turned toward the Left Neurogenic pain stroke, no improvement will increase gabapentin to 400mg TID  4. Mood: Wellbutrin 300 mg daily,Lexapro 5 mg daily at bedtime, poor attention             -antipsychotic agents: N/A 5. Neuropsych: This patient is not fully capable of making decisions on her own behalf. 6. Skin/Wound Care:  Routine skin checks 7. Fluids/Electrolytes/Nutrition:  encourage PO, variable 8. Dysphagia. Dysphagia #1 honey liquids.  Follow-up speech therapy             Advance diet as tolerated- per SLP 9. Hypertension. Norvasc 5 mg daily,lisinopril 40 mg daily, Coreg 12.5 mg twice a day.    Vitals:   05/03/18 2043 05/04/18 0520  BP: 105/70 101/77  Pulse: 68 64  Resp: 18   Temp:  97.7 F (36.5 C)  SpO2: 97% 100%  Controlled 5/1 10. Diabetes mellitus. Hemoglobin A1c 8.0. Glucotrol XL 2.5 mg daily, Lantus insulin 12 units daily at bedtime. Check blood sugars before meals and at bedtime.      CBG (last 3)  Recent Labs    05/03/18 1646 05/03/18 2137 05/04/18 0639  GLUCAP 190* 173* 119*  Controlled  5/1 11. Hyperlipidemia. Lipitor 12. Hypothyroidism. Continue Synthroid 13.  Acute blood loss anemia:              Hemoglobin 9.3 on 4/4 follow-up 10.8 on 4/13 14.  CKD stage III:             Creatinine 1.11 on 4/4. 1.22 on 4/6  stable      LOS: 27 days A FACE TO FACE EVALUATION WAS PERFORMED  Erick Colace 05/04/2018, 9:22 AM

## 2018-05-04 NOTE — Progress Notes (Signed)
Social Work Patient ID: [name], female   DOB: 05/14/1935, 83 y.o.   MRN: [id number]   CSW met with pt in her room and with pt's family via telephone with dtr, son-in-law, and two grandchildren.  Pt is on track for d/c on 05-05-18 and family has completed one day of family education and is coming in for one more prior to d/c.  DME to be delivered to pt's dtr's home to prepare for her arrival.  Pt will d/c via non-emergent ambulance on Saturday.  CSW will call to schedule that for around 11am.  CSW has arranged [place] for PT/OT/ST/RN/CNA.  Family has arranged for private duty care for workday hours.  CSW remains available to assist as needed.

## 2018-05-04 NOTE — Telephone Encounter (Signed)
Please contact pt to complete TCM follow up call. She already has an appointment on 5/11 it looks like. Thanks.

## 2018-05-04 NOTE — Telephone Encounter (Signed)
Patient currently in hospitalized according to chart.

## 2018-05-04 NOTE — Patient Care Conference (Signed)
Inpatient RehabilitationTeam Conference and Plan of Care Update Date: 05/02/2018   Time: 10:35 AM    Patient Name: Jessica Meyer      Medical Record Number: [id number]  Date of Birth: 07-Jul-1935 Sex: Female         Room/Bed: 4W16C/4W16C-01 Payor Info: Payor: MEDICARE / Plan: MEDICARE PART A AND B / Product Type: *No Product type* /    Admitting Diagnosis: CVA  Admit Date/Time:  04/07/2018  1:53 PM Admission Comments: No comment available   Primary Diagnosis:  <principal problem not specified> Principal Problem: <principal problem not specified>  Patient Active Problem List   Diagnosis Date Noted  . Neck pain on right side   . Right middle cerebral artery stroke ([place]) 04/07/2018  . Spastic hemiparesis of left nondominant side due to acute cerebral infarction ([place])   . CKD (chronic kidney disease), stage III ([place])   . Acute blood loss anemia   . Hypothyroidism   . Dyslipidemia   . Diabetes mellitus type 2 in nonobese ([facility])   . Dysphagia, post-stroke   . Dysarthria, post-stroke   . Middle cerebral artery embolism, right 04/05/2018  . Stroke (cerebrum) ([place]) 04/04/2018  . Atypical chest pain 11/10/2017  . CKD stage G3b/A1, GFR 30-44 and albumin creatinine ratio <30 mg/g (H[facility]) 08/10/2016  . Left hip pain 06/14/2016  . Type 2 diabetes mellitus with stage 3 chronic kidney disease, with long-term current use of insulin ([place]) 01/29/2016  . Leukocytosis 01/25/2016  . Closed fracture of right hip ([place])   . Fall   . Left knee pain 05/28/2015  . Sprain of hand, third finger, right 05/28/2015  . HTN (hypertension) 07/29/2014  . Gait instability 07/29/2014  . Hyperlipidemia 07/29/2014  . Right knee pain 04/11/2014  . ASNHL (asymmetrical sensorineural hearing loss) 08/07/2013  . 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 Hernia, rectovaginal 08/07/2013  . Hypercholesterolemia without hypertriglyceridemia 08/07/2013  . Arthritis of knee, degenerative 08/07/2013  . Degenerative arthritis of lumbar  spine 08/07/2013  . Osteopenia 08/07/2013  . Adult hypothyroidism 08/07/2013  . History of artificial joint 08/07/2013  . Essential (primary) hypertension 08/07/2013  . Depression with anxiety 08/07/2013    Expected Discharge Date: Expected Discharge Date: 05/05/18  Team Members Present: Physician leading conference: Dr. Alysia Penna Social Worker Present: Alfonse Alpers, LCSW Nurse Present: Benjie Karvonen, RN PT Present: Barrie Folk, PT;Rosita Dechalus, PTA OT Present: Darleen Crocker, OT SLP Present: Stormy Fabian, SLP PPS Coordinator present : Gunnar Fusi     Current Status/Progress Goal Weekly Team Focus  Medical   Dysesthetic pain on increasing dose of gabapentin  reduce pain ,improve safety  D/C planning   Bowel/Bladder   m  incontinence management, timed toileting  assess toileting needs qshift and PRN   Swallow/Nutrition/ Hydration   dysphagia 1 with honey thick liquids with max A  Max A  Trials of nectar thick   ADL's   max - total A of 2 for self care and functional transfers, no significant change in pushing, bed level ADLs for safety  mod A overall but will likely need to be downgraded again  d/c planning, hands on family edu, cognition, functional transfer, NMR   Mobility   maxA 1-2 bed mobility, maxA to total A stand pivot transfer, continues to demonstrate significant pushing  ModA  family education in preparation of d/c at bed level with w/c eval planned for 4/29   Communication             Safety/Cognition/ Behavioral  Observations  Max A for sustained attention, basic problem solving  Max A  left inattention, problem solving, sustained attention   Pain   c/o pain to multiple sites. Mostly right neck and shoulder  relieved with q6 narco PRN and q8 robaxin PRN and tylenol PRN  pain less than 3  assess pain management qshift PRN   Skin   No skin issues  no new skin breakdown  assess skion qshift and PRN    Rehab Goals Patient on target to meet rehab  goals: Yes Rehab Goals Revised: downgraded goals should be met by d/c *See Care Plan and progress notes for long and short-term goals.     Barriers to Discharge  Current Status/Progress Possible Resolutions Date Resolved   Physician    Medical stability     improving slowly   S/P Dysport (botulinum toxin), cont family training      Nursing                  PT                    OT                  SLP                SW                Discharge Planning/Teaching Needs:  Pt's dtr and extended family would like for pt to go to dtr's home at d/c where family paid caregivers will care for pt.    Family came in for family education are prepared to care for pt.   Team Discussion:  Pt's bicep loosening up, but hamstring is still tight.  Dr. Letta Pate thinks pt has neuropathy in her legs and it's not stroke related and he is increasing gabapentin 3x/day, may need 4x/day.  Pt will be ready medically for d/c.  Pt is incontinent of both bowel and bladder.  Pt's OT family education went well and they were receptive to learning.  Pt needs more training with hoyer lift.  They will need two people for txs.  Pt has not made much change in OT this week.  PT family education went pretty well and sat pt at the edge of the bed to show how unsafe pt would be at home doing that at home and why lift is most important.  Pt is about the same with ST and family education has been completed and pt is on puree and honey thick liquids.  Revisions to Treatment Plan:  none    Continued Need for Acute Rehabilitation Level of Care: The patient requires daily medical management by a physician with specialized training in physical medicine and rehabilitation for the following conditions: Daily direction of a multidisciplinary physical rehabilitation program to ensure safe treatment while eliciting the highest outcome that is of practical value to the patient.: Yes Daily medical management of patient stability for  increased activity during participation in an intensive rehabilitation regime.: Yes Daily analysis of laboratory values and/or radiology reports with any subsequent need for medication adjustment of medical intervention for : Neurological problems;Other   I attest that I was present, lead the team conference, and concur with the assessment and plan of the team.Team conference was held via web/ teleconference due to [place] - 19.    Kendalynn Wideman, Silvestre Mesi 05/04/2018, 6:40 PM

## 2018-05-04 NOTE — Progress Notes (Signed)
Speech Language Pathology Discharge Summary  Patient Details  Name: Rox Mcgriff MRN: [id number] Date of Birth: 1935-10-05  Today's Date: 05/04/2018 SLP Individual Time: 0815-0900 SLP Individual Time Calculation (min): 45 min   Skilled Therapeutic Interventions:  Skilled treatment session focused on cognition goals. SLP facilitated session by providing Max A for sustained attention to task d/t pt perseverative on topics other than task. Pt able to complete basi sorting task with Max A cues.     Patient has met 5 of 5 long term goals.  Patient to discharge at [facility] Max;Total level.    Clinical Impression/Discharge Summary:   Pt has made minimal progress during skilled ST sessions and as such she has made no functional progress. While pt has ability during a Modified Barium Swallow Study to consume advanced diet textures and thin liquids without aspiration, she is extremely unsafe consuming this at bedside. Given pt's severe cognitive impairments, she is not responsive for cues to slow rate, control bolus size or cease talking while eating which leds to overt signs of aspiration when consuming dysphagia 2 diet and thin liquids. Currently the only diet recommended is puree with nectar thick liquids. However pt can be upgrade with HHST d/t pt coughed during MBS during moment of aspiration. So if pt's mentation improves and she becomes free of overt s/s when consuming advanced diet textures and thin liquids, she could feasibly upgraded per direction of HHST.    Care Partner:  Caregiver Able to Provide Assistance: Yes  Type of Caregiver Assistance: Physical;Cognitive  Recommendation:  Home Health SLP;24 hour supervision/assistance  Rationale for SLP Follow Up: Maximize functional communication;Maximize cognitive function and independence;Maximize swallowing safety;Reduce caregiver burden   Equipment:     Reasons for discharge: Discharged from hospital   Patient/Family Agrees with  Progress Made and Goals Achieved: Yes    [place] 05/04/2018, 11:56 AM

## 2018-05-04 NOTE — Discharge Summary (Signed)
Occupational Therapy Discharge Summary  Patient Details  Name: Jessica Meyer MRN: [id number] Date of Birth: [birth year]  Today's Date: 05/04/2018 OT Individual Time:  - 4098-1191 Individual Treatment Time Calculation: 72 min    Patient has met 2 of 10 long term goals due to improved activity tolerance and improved attention.  Patient to discharge at overall Total Assist level.  Patient's caregivers have attended 2 family education sesssions to provide the necessary assistance at discharge.    Pt continues to exhibit significant physical and cognitive impairments, and therefore 8/10 goals were unable to be met for safety reasons.   Recommendation:  Patient will benefit from ongoing skilled OT services in home health setting to continue to advance functional skills in the area of BADL.  Equipment: Harrel Lemon lift + hospital bed  Reasons for discharge: discharge from hospital  Patient/family agrees with progress made and goals achieved: Yes   Skilled Therapeutic Intervention:  Pt greeted in bed with c/o Lt sided pain. RN made aware, but pt was already premedicated for pain. Pt reported needing to use the bedpan. Mod-Max A for rolling during placement of bedpan. Pt with continent B+B void once bed was placed in chair position. Similar assist required for rolling during perihygiene completion and brief change. Total A for donning pants bedlevel with facilitation for hemi techniques. Supine<sit with Max A with vcs for hands in lap to reduce pushing EOB. Manual facilitation and vcs required for maintaining anterior weight shift in prep for squat pivot transfer to TIS (with +2 assist). While sitting at sink, pt completed UB dressing, oral care, and handwashing. OT facilitated L UE as gross stabilizer/assist during stated tasks. Pt required vcs to locate all needed items due to combined visual and attention deficits. She continues to lack active movement in Lt arm and reports pain with  touch/ROM/repositioning frequently. Pt required Max A overall to meet demands of stated tasks. During remainder of session, pt drank honey thickened water via teaspoon. Had her scan to Lt side to retrieve spoon, and bring water to mouth. Pt often reporting not being able to find spoon when it was placed on Lt side, even when turning head. Dysmetria noted when reaching for spoon and bringing it to mouth. No coughs noted. At end of session pt was reclined in TIS for safety, call bell on Rt side, and safety belt fastened.   OT Discharge Precautions/Restrictions  Precautions Precautions: Fall Precaution Comments: Lt hemi  Pain: At end of session, OT completed gentle stretching/ROM to affected arm to address pain. Repositioned limb at end of tx to protect hemiplegic side.  Pain Assessment Pain Score: 0-No pain ADL ADL Eating: Maximal assistance Where Assessed-Eating: Bed level Grooming: Maximal assistance Where Assessed-Grooming: Sitting at sink Upper Body Bathing: Maximal assistance Where Assessed-Upper Body Bathing: Bed level Lower Body Bathing: Dependent Where Assessed-Lower Body Bathing: Bed level Upper Body Dressing: Maximal assistance Where Assessed-Upper Body Dressing: Sitting at sink Lower Body Dressing: Dependent Where Assessed-Lower Body Dressing: Bed level Toileting: Dependent(2 assist) Where Assessed-Toileting: Bed level Toilet Transfer: Dependent(2 assist) Toilet Transfer Method: Stand pivot Toilet Transfer Equipment: Grab bars Tub/Shower Transfer: Not assessed(due to safety concerns) ADL Comments: Caregivers have attended family education sessions. They are aware of recommendations to complete self care tasks bedlevel for safety.  Vision Baseline Vision/History: Wears glasses Wears Glasses: At all times Patient Visual Report: Blurring of vision Vision Assessment?: Vision impaired- to be further tested in functional context(Difficult to thoroughly assess vision due to  cognitive deficits. Impairments noted  during functional scanning, tracking, and attendance to ADL items/body areas in left visual field) Alignment/Gaze Preference: Gaze right Perception  Perception: Impaired Inattention/Neglect: Does not attend to left visual field;Does not attend to left side of body(Lt inattention improved since time of eval, though deficits persist) Praxis Praxis: Impaired Praxis Impairment Details: Initiation;Motor planning;Perseveration Cognition Overall Cognitive Status: Impaired/Different from baseline Arousal/Alertness: Awake/alert Orientation Level: Oriented to person;Oriented to place;Oriented to situation Attention: Sustained Sustained Attention: Impaired Sustained Attention Impairment: Verbal basic;Functional basic Memory: Impaired Memory Impairment: Decreased short term memory;Decreased recall of new information;Retrieval deficit Decreased Short Term Memory: Verbal basic;Functional basic Awareness: Impaired Awareness Impairment: Intellectual impairment Problem Solving: Impaired Problem Solving Impairment: Verbal basic;Functional basic Executive Function: (all are impaired d/t lower level deficits) Behaviors: Impulsive;Restless;Poor frustration tolerance Safety/Judgment: Impaired Comments: She continues to lack insight of deficits Sensation Sensation Light Touch: Impaired Detail(Hypersensitive to touch, painful. Difficult to formally assess due to cognitive deficits) Coordination Gross Motor Movements are Fluid and Coordinated: No Fine Motor Movements are Fluid and Coordinated: No Coordination and Movement Description: Affected by Lt hemiplegia and pushing tendencies Motor  Motor Motor: Hemiplegia;Abnormal tone;Abnormal postural alignment and control;Motor impersistence Mobility     Trunk/Postural Assessment  Postural Control Postural Control: Deficits on evaluation Trunk Control: Lt pushing tendencies, trunk extensor tone noted when EOB Righting  Reactions: impaired  Balance Balance Balance Assessed: Yes Dynamic Sitting Balance Dynamic Sitting - Balance Support: Feet supported(During functional transfers) Dynamic Sitting - Level of Assistance: 2: Max assist Extremity/Trunk Assessment RUE Assessment RUE Assessment: Within Functional Limits LUE Assessment LUE Assessment: Exceptions to [facility] LUE Body System: Neuro Brunstrum levels for arm and hand: Hand;Arm(Flexor synergy patterns) Brunstrum level for arm: Stage II Synergy is developing Brunstrum level for hand: Stage II Synergy is developing   Evianna Chandran A Tamryn Popko 05/04/2018, 12:00 PM

## 2018-05-05 DIAGNOSIS — M792 Neuralgia and neuritis, unspecified: Secondary | ICD-10-CM

## 2018-05-05 DIAGNOSIS — R7309 Other abnormal glucose: Secondary | ICD-10-CM

## 2018-05-05 LAB — GLUCOSE, CAPILLARY
Glucose-Capillary: 171 mg/dL — ABNORMAL HIGH (ref 70–99)
Glucose-Capillary: 194 mg/dL — ABNORMAL HIGH (ref 70–99)

## 2018-05-05 NOTE — Plan of Care (Signed)
Family has been provided d/c paperwork and family education. Pt to d/c home via EMS.

## 2018-05-05 NOTE — Progress Notes (Signed)
EMS here to get pt, Called family to advise, informed that lunch not on floor, will not admin insulin per request of family, personal belongings packed and sent. DNR form not in chart. Pt d/c to home

## 2018-05-05 NOTE — Progress Notes (Signed)
[facility] PHYSICAL MEDICINE & REHABILITATION PROGRESS NOTE  Subjective/Complaints: Patient seen laying in bed this AM she is hard of hearing, but indicates she slept well and that she wants to and is ready for discharge.  ROS: Denies CP, SOB, N/V/D  Objective: Vital Signs: Blood pressure 124/61, pulse 66, temperature 98 F (36.7 C), temperature source Oral, resp. rate 19, height 5\' 5"  (1.651 m), weight 66.3 kg, SpO2 98 %. No results found. No results for input(s): WBC, HGB, HCT, PLT in the last 72 hours. No results for input(s): NA, K, CL, CO2, GLUCOSE, BUN, CREATININE, CALCIUM in the last 72 hours.  Physical Exam: BP 124/61 (BP Location: Right Arm)   Pulse 66   Temp 98 F (36.7 C) (Oral)   Resp 19   Ht 5\' 5"  (1.651 m)   Wt 66.3 kg   SpO2 98%   BMI 24.33 kg/m  Constitutional: No distress . Vital signs reviewed. HENT: Normocephalic.  Atraumatic. Eyes: EOMI. No discharge. Cardiovascular: No JVD. Respiratory: Normal effort. GI: Non-distended. Musc: No edema or tenderness in extremities. Neurological: She is alert  Hard of hearing  left-sided neglect  Poor insight into deficits Dysarthria  motor: LUE/LLE 0/5 proximal distal  Skin: Skin is warm and dry.Psychiatric: flat   Assessment/Plan:  1. Functional deficits secondary to right MCA infarct  Care Tool:  Bathing  Bathing activity did not occur: Safety/medical concerns Body parts bathed by patient: Face, Right upper leg(Per most recent staff documentation )   Body parts bathed by helper: Right arm, Left arm, Chest, Abdomen, Front perineal area, Buttocks, Left upper leg, Right lower leg, Left lower leg Body parts n/a: Front perineal area, Buttocks(pt already cleansed by nursing)   Bathing assist Assist Level: Total Assistance - Patient < 25%     Upper Body Dressing/Undressing Upper body dressing   What is the patient wearing?: (per staff documentation)    Upper body assist Assist Level: Moderate Assistance -  Patient 50 - 74%    Lower Body Dressing/Undressing Lower body dressing    Lower body dressing activity did not occur: Safety/medical concerns What is the patient wearing?: Pants, Incontinence brief     Lower body assist Assist for lower body dressing: Total Assistance - Patient < 25%     Toileting Toileting    Toileting assist Assist for toileting: 2 Helpers     Transfers Chair/bed transfer  Transfers assist  Chair/bed transfer activity did not occur: Safety/medical concerns  Chair/bed transfer assist level: 2 Helpers     Locomotion Ambulation   Ambulation assist   Ambulation activity did not occur: Safety/medical concerns  Assist level: 2 helpers Assistive device: No Device Max distance: 30'   Walk 10 feet activity   Assist  Walk 10 feet activity did not occur: Safety/medical concerns  Assist level: 2 helpers     Walk 50 feet activity   Assist Walk 50 feet with 2 turns activity did not occur: Safety/medical concerns         Walk 150 feet activity   Assist Walk 150 feet activity did not occur: Safety/medical concerns         Walk 10 feet on uneven surface  activity   Assist Walk 10 feet on uneven surfaces activity did not occur: Safety/medical concerns         Wheelchair     Assist Will patient use wheelchair at discharge?: Yes Type of Wheelchair: Manual    Wheelchair assist level: Dependent - Patient 0%(TIS WC) Max wheelchair distance:  150 ft    Wheelchair 50 feet with 2 turns activity    Assist        Assist Level: Dependent - Patient 0%   Wheelchair 150 feet activity     Assist     Assist Level: Dependent - Patient 0%      Medical Problem List and Plan: 1.  Left side weakness with dysphagia secondary to right MCA infarction in the setting of right ICA and right MCA occlusion status post unsuccessful attempts at thrombectomy.  DC today   Follow-up with rehab MD in 1-2 weeks for transitional care  management   spasticity s/p Dysport injection 500U to L bieps and to L hamstring, expect effect ~4/30 2.  Antithrombotics: -DVT/anticoagulation:  SCDs             -antiplatelet therapy: aspirin 81 mg daily, Plavix 75 mg daily 3. Pain Management:  Tylenol 3 for headache as needed, flank pain has subsided, right-sided neck pain is the current complaint. Xray confirmed cervical spondylosis +/- DDD exacerbated by neck positioning from Left neglect Kpad , sportscreme Robaxin  hydrocodone-Positional challenges pt keeps head turned toward the Left  Neuropathic pain stroke, increased gabapentin to 400mg TID  4. Mood: Wellbutrin 300 mg daily,Lexapro 5 mg daily at bedtime, poor attention             -antipsychotic agents: N/A 5. Neuropsych: This patient is not fully capable of making decisions on her own behalf. 6. Skin/Wound Care:  Routine skin checks 7. Fluids/Electrolytes/Nutrition:  encourage PO 8. Dysphagia. Dysphagia #1 honey liquids.  Continue to follow-up as outpatient for advancement in diet 9. Hypertension. Norvasc 5 mg daily,lisinopril 40 mg daily, Coreg 12.5 mg twice a day.    Vitals:   05/04/18 2045 05/05/18 0640  BP: (!) 133/56 124/61  Pulse: 69 66  Resp: 18 19  Temp: 98.2 F (36.8 C) 98 F (36.7 C)  SpO2: 98% 98%   Control on 5/2 10. Diabetes mellitus. Hemoglobin A1c 8.0. Glucotrol XL 2.5 mg daily, Lantus insulin 12 units daily at bedtime. Check blood sugars before meals and at bedtime.      CBG (last 3)  Recent Labs    05/04/18 1731 05/04/18 2045 05/05/18 0642  GLUCAP 226* 153* 171*   Labile on last 24 hours, will need ambulatory monitoring with further adjustments 11. Hyperlipidemia. Lipitor 12. Hypothyroidism. Continue Synthroid 13.  Acute blood loss anemia:              Hemoglobin 10.8 on 4/13 14.  CKD stage III:             Creatinine 1.22 on 4/6  LOS: 28 days A FACE TO FACE EVALUATION WAS PERFORMED  Jessica Meyer Karis JubaAnil Makenzie Vittorio 05/05/2018, 11:26 AM

## 2018-05-06 DIAGNOSIS — I69391 Dysphagia following cerebral infarction: Secondary | ICD-10-CM | POA: Diagnosis not present

## 2018-05-06 DIAGNOSIS — I69354 Hemiplegia and hemiparesis following cerebral infarction affecting left non-dominant side: Secondary | ICD-10-CM | POA: Diagnosis not present

## 2018-05-06 DIAGNOSIS — F418 Other specified anxiety disorders: Secondary | ICD-10-CM | POA: Diagnosis not present

## 2018-05-06 DIAGNOSIS — Z794 Long term (current) use of insulin: Secondary | ICD-10-CM | POA: Diagnosis not present

## 2018-05-06 DIAGNOSIS — I129 Hypertensive chronic kidney disease with stage 1 through stage 4 chronic kidney disease, or unspecified chronic kidney disease: Secondary | ICD-10-CM | POA: Diagnosis not present

## 2018-05-06 DIAGNOSIS — E1122 Type 2 diabetes mellitus with diabetic chronic kidney disease: Secondary | ICD-10-CM | POA: Diagnosis not present

## 2018-05-06 DIAGNOSIS — Z96641 Presence of right artificial hip joint: Secondary | ICD-10-CM | POA: Diagnosis not present

## 2018-05-06 DIAGNOSIS — M47816 Spondylosis without myelopathy or radiculopathy, lumbar region: Secondary | ICD-10-CM | POA: Diagnosis not present

## 2018-05-06 DIAGNOSIS — R1310 Dysphagia, unspecified: Secondary | ICD-10-CM | POA: Diagnosis not present

## 2018-05-06 DIAGNOSIS — M8580 Other specified disorders of bone density and structure, unspecified site: Secondary | ICD-10-CM | POA: Diagnosis not present

## 2018-05-06 DIAGNOSIS — E039 Hypothyroidism, unspecified: Secondary | ICD-10-CM | POA: Diagnosis not present

## 2018-05-06 DIAGNOSIS — E785 Hyperlipidemia, unspecified: Secondary | ICD-10-CM | POA: Diagnosis not present

## 2018-05-06 DIAGNOSIS — N183 Chronic kidney disease, stage 3 (moderate): Secondary | ICD-10-CM | POA: Diagnosis not present

## 2018-05-06 DIAGNOSIS — Z96653 Presence of artificial knee joint, bilateral: Secondary | ICD-10-CM | POA: Diagnosis not present

## 2018-05-07 ENCOUNTER — Telehealth: Payer: Self-pay

## 2018-05-07 ENCOUNTER — Telehealth: Payer: Self-pay | Admitting: Internal Medicine

## 2018-05-07 ENCOUNTER — Telehealth: Payer: Self-pay | Admitting: Family

## 2018-05-07 ENCOUNTER — Ambulatory Visit (INDEPENDENT_AMBULATORY_CARE_PROVIDER_SITE_OTHER): Payer: Medicare Other | Admitting: Family

## 2018-05-07 ENCOUNTER — Other Ambulatory Visit: Payer: Self-pay | Admitting: Family

## 2018-05-07 ENCOUNTER — Encounter: Payer: Self-pay | Admitting: Internal Medicine

## 2018-05-07 ENCOUNTER — Other Ambulatory Visit: Payer: Self-pay

## 2018-05-07 DIAGNOSIS — E1122 Type 2 diabetes mellitus with diabetic chronic kidney disease: Secondary | ICD-10-CM

## 2018-05-07 DIAGNOSIS — N183 Chronic kidney disease, stage 3 unspecified: Secondary | ICD-10-CM

## 2018-05-07 DIAGNOSIS — E782 Mixed hyperlipidemia: Secondary | ICD-10-CM

## 2018-05-07 DIAGNOSIS — I129 Hypertensive chronic kidney disease with stage 1 through stage 4 chronic kidney disease, or unspecified chronic kidney disease: Secondary | ICD-10-CM | POA: Diagnosis not present

## 2018-05-07 DIAGNOSIS — Z720 Tobacco use: Secondary | ICD-10-CM | POA: Diagnosis not present

## 2018-05-07 DIAGNOSIS — I6529 Occlusion and stenosis of unspecified carotid artery: Secondary | ICD-10-CM

## 2018-05-07 DIAGNOSIS — I69354 Hemiplegia and hemiparesis following cerebral infarction affecting left non-dominant side: Secondary | ICD-10-CM | POA: Diagnosis not present

## 2018-05-07 DIAGNOSIS — Z794 Long term (current) use of insulin: Secondary | ICD-10-CM

## 2018-05-07 DIAGNOSIS — I639 Cerebral infarction, unspecified: Secondary | ICD-10-CM

## 2018-05-07 DIAGNOSIS — I10 Essential (primary) hypertension: Secondary | ICD-10-CM

## 2018-05-07 DIAGNOSIS — E119 Type 2 diabetes mellitus without complications: Secondary | ICD-10-CM

## 2018-05-07 DIAGNOSIS — R1310 Dysphagia, unspecified: Secondary | ICD-10-CM | POA: Diagnosis not present

## 2018-05-07 DIAGNOSIS — I69391 Dysphagia following cerebral infarction: Secondary | ICD-10-CM | POA: Diagnosis not present

## 2018-05-07 MED ORDER — NICOTINE 14 MG/24HR TD PT24: 14.0000 mg | MEDICATED_PATCH | Freq: Every day | TRANSDERMAL | 0 refills | Status: AC

## 2018-05-07 NOTE — Progress Notes (Signed)
Virtual Visit via Video Note  I connected with Jessica Meyer on 05/07/18 at  1:40 PM EDT by a video enabled telemedicine application (due to Covid-19) and verified that I am speaking with the correct person.   Location: Patient: home Provider: home  The patient, her daughter and myself were on today's phone call. The home health PT was also present for part of the call.    I discussed the limitations of evaluation and management by telemedicine and the availability of in person appointments. The patient expressed understanding and agreed to proceed.  History of Present Illness:  Patient is an 83 yr old female who presents today for follow up. She was admitted from home  on 04/04/18. Patient was diagnosed with R MCA and B cerebral infarcts in the setting of R ICA and R MCA occlusion.  Her presenting symptom was left sided weakness.  She was found on the ground by her daughter.  An attempt was made to revascularize her R MCA occlusion via angioplasties but this was unsuccessful.  She was found to have near complete carotid blockages which made revascularization impossible. She was ultimately transferred to [facility] for ongoing PT/OT and ST.  She was continued on aspirin 81mg  daily and plavix was added for secondary stroke prevention. Plan is to continue both x 3 months then aspirin alone. Continue dysphagia 1 diet for now.    The patient was in Rehab 4/4-5/2.  She has been staying with her daughter and her daughter's family since discharge. They have PT/OT coming in and are waiting for speech therapy. She is on a pureed diet. She has residual left hemiplegia following her CVA.   Daughter reports that the patient uses snuff intermittently and has for 45 years. She was not offered any nicotine replacement in the hospital but now that she is home she is continuing to request her snuff and the family is hoping to place her on a nicotine patch instead.   Daughter notes that the  patient has more difficulty with cognition following her stroke, especially with multistep instructions.  She states that her mother hopes to return to independent living but that it will depend on her progress. She has paid caregivers with her during the day (8-6) and the family is with her in the evenings and weekends.    DM2- daughter notes that last night sugar was 350.  She has a call into her endocrinologist to discuss possibility of adding a sliding scale.    Observations/Objective:   Gen: Awake, alert, no acute distress, seated in chair.   Resp: Breathing is even and non-labored Psych: slightly anxious, seems frustrated Neuro: Alert, + left sided facial drooping,  speech is without significant dysarthria    Assessment and Plan:  R MCA stroke- will refer to neurology for formal follow up in 4 weeks. Plan per discharge summary is ASA + plavix 3 months, then plavix alone. I spoke with PT who was there today at time of my virtual visit. She states pt is a 2 person max assist on the left.    Carotid stenosis-  Will defer further management of Carotid stenosis to neurology.   DM2- hyperglycemia- will defer med management to endocrinology.  Tobacco abuse- trial of nicorette patch 14 mcg x 1 month then consider dropping to 7 mcg x 1 month.  Hyperlipidemia- LDL is at goal, continue statin.   Lab Results  Component Value Date   CHOL 131 04/05/2018   HDL 42  04/05/2018   LDLCALC 63 04/05/2018   TRIG 130 04/05/2018   CHOLHDL 3.1 04/05/2018   HTN- BP has been stable, continue current meds.  BP Readings from Last 3 Encounters:  05/05/18 124/61  04/07/18 114/68  03/02/18 131/61     Follow Up Instructions:    I discussed the assessment and treatment plan with the patient. The patient was provided an opportunity to ask questions and all were answered. The patient agreed with the plan and demonstrated an understanding of the instructions.   The patient was advised to call back or  seek an in-person evaluation if the symptoms worsen or if the condition fails to improve as anticipated.   Lemont FillersMelissa S O'Sullivan, NP

## 2018-05-07 NOTE — Telephone Encounter (Signed)
05/07/18  Transition Care Management Follow-up Telephone Call  ADMISSION DATE: 04/07/18 DISCHARGE DATE: 05/03/18   How have you been since you were released from the hospital? About the same as she was in hospital per daughter.Mental status has declined  Do you understand why you were in the hospital? Yes per daughter.   Do you understand the discharge instrcutions? Yes per daughter    Items Reviewed:  Medications reviewed: Yes   Allergies reviewed:NKDA  Dietary changes reviewed: Diabetic,Thickened, Puree  Referrals reviewed: Dr. Reino Kent. Has appointment scheduled with M.Osullivan.   Functional Questionnaire:  Activities of Daily Living (ADLs): Unable to perform any without assistance.   Any patient concerns? Daughter concerned about insulin. Advised to call Dr. Serena Croissant office.   Confirmed importance and date/time of follow-up visits scheduled:Yes   Confirmed with patient if condition begins to worsen call PCP or go to the ER. Yes   Patient was given the office number and encouragred to call back with questions or concerns.Yes

## 2018-05-07 NOTE — Telephone Encounter (Unsigned)
Copied from CRM [id number]. Topic: Quick Communication - Rx Refill/Question >> May 07, 2018 11:27 AM Darletta Moll L wrote: Medication: nicotine patch  Has the patient contacted their pharmacy? {yes (Agent: If no, request that the patient contact the pharmacy for the refill.) (Agent: If yes, when and what did the pharmacy advise?)  Preferred Pharmacy (with phone number or street name): [facility] [address] [place], [place] - [address] [place] [address] Phone: [phone] Fax: [phone]  Agent: Please be advised that RX refills may take up to 3 business days. We ask that you follow-up with your pharmacy.

## 2018-05-07 NOTE — Telephone Encounter (Signed)
Patient had stroke and was just release from hospital.  Was placed on sliding scale for insulin while inpatient.  Daughter has called asking for advise on how to proceed?  Sliding scale or another route.  Patient has been having some high blood sugars  Please call Felicita Gage at [phone]

## 2018-05-07 NOTE — Telephone Encounter (Signed)
Copied from CRM [id number]. Topic: Quick Communication - Home Health Verbal Orders >> May 07, 2018  8:53 AM Lynne Logan D wrote: Caller/Agency: Kirt Boys, RN [facility] Number: [phone] / Secure VM Requesting OT/PT/Skilled Nursing/Social Work/Speech Therapy: Skilled Nursing Frequency: 2 week 1 / 1 week 3 / Home health aide for bathing 2 week 4 / Speech Therapy Evaluation

## 2018-05-07 NOTE — Progress Notes (Signed)
Social Work Discharge Note  The overall goal for the admission was met for:   Discharge location: Yes - to dtr's home in [place]  Length of Stay: Yes - 28 days  Discharge activity level: Yes - total assist w/c level  Home/community participation: Yes  Services provided included: MD, RD, PT, OT, SLP, RN, Pharmacy, Neuropsych and SW  Financial Services: Medicare and Private Insurance: [place] as a secondary  Follow-up services arranged: Home Health: PT/OT/ST/RN/CNA, DME: Hospital bed; hoyer lift with u-sling; 30" slide board and Patient/Family request agency HH: [facility], DME: [facility]  Other:  Specialty w/c from [place] (or additional information):  Family education has been completed.  Dtr is prepared to care for pt at home and has hired caregivers for the work day.  Ramp has been built and DME delivered.   Patient/Family verbalized understanding of follow-up arrangements: Yes  Individual responsible for coordination of the follow-up plan: pt's dtr and son-in-law and other extended family  Confirmed correct DME delivered: Trey Sailors 05/07/2018    Win Guajardo, Silvestre Mesi

## 2018-05-07 NOTE — Telephone Encounter (Signed)
Scheduled for virtual visit today ?

## 2018-05-08 ENCOUNTER — Telehealth: Payer: Self-pay | Admitting: Family

## 2018-05-08 DIAGNOSIS — R350 Frequency of micturition: Secondary | ICD-10-CM

## 2018-05-08 NOTE — Telephone Encounter (Signed)
Please set up a f/u visit - virtual ideally.

## 2018-05-08 NOTE — Telephone Encounter (Signed)
Appointment made for tomorrow

## 2018-05-08 NOTE — Telephone Encounter (Signed)
Copied from CRM [id number]. Topic: Quick Communication - Rx Refill/Question >> May 08, 2018  3:09 PM Richarda Blade wrote: Medication: 3 in one bedside commode   The family does not have a preference of the medical supplier     Agent: Please be advised that RX refills may take up to 3 business days. We ask that you follow-up with your pharmacy.

## 2018-05-08 NOTE — Telephone Encounter (Signed)
Yes, please return her call and give verbal order for services requested.

## 2018-05-09 ENCOUNTER — Ambulatory Visit (INDEPENDENT_AMBULATORY_CARE_PROVIDER_SITE_OTHER): Payer: Medicare Other | Admitting: Internal Medicine

## 2018-05-09 ENCOUNTER — Encounter: Payer: Self-pay | Admitting: Internal Medicine

## 2018-05-09 ENCOUNTER — Other Ambulatory Visit: Payer: Self-pay

## 2018-05-09 DIAGNOSIS — N183 Chronic kidney disease, stage 3 unspecified: Secondary | ICD-10-CM

## 2018-05-09 DIAGNOSIS — I639 Cerebral infarction, unspecified: Secondary | ICD-10-CM | POA: Diagnosis not present

## 2018-05-09 DIAGNOSIS — E1122 Type 2 diabetes mellitus with diabetic chronic kidney disease: Secondary | ICD-10-CM | POA: Diagnosis not present

## 2018-05-09 DIAGNOSIS — E785 Hyperlipidemia, unspecified: Secondary | ICD-10-CM

## 2018-05-09 DIAGNOSIS — Z794 Long term (current) use of insulin: Secondary | ICD-10-CM

## 2018-05-09 MED ORDER — INSULIN PEN NEEDLE 32G X 4 MM MISC: 3 refills | Status: AC

## 2018-05-09 MED ORDER — INSULIN GLARGINE 100 UNIT/ML SOLOSTAR PEN: 20.0000 [IU] | PEN_INJECTOR | Freq: Every day | SUBCUTANEOUS | 11 refills | Status: AC

## 2018-05-09 MED ORDER — INSULIN LISPRO (1 UNIT DIAL) 100 UNIT/ML (KWIKPEN): 4.0000 [IU] | PEN_INJECTOR | Freq: Three times a day (TID) | SUBCUTANEOUS | 11 refills | Status: AC

## 2018-05-09 NOTE — Telephone Encounter (Signed)
Verbal orders given to [facility] for PT, OT and speech therapy.

## 2018-05-09 NOTE — Telephone Encounter (Signed)
lvm for Kirt Boys, RN with [facility] to call me back for orders.

## 2018-05-09 NOTE — Telephone Encounter (Signed)
Please write rx if appropriate

## 2018-05-09 NOTE — Telephone Encounter (Signed)
Left message on daughter's voice mail requesting name of the company who is currently working with her for PT/OT.    After I received this message I found a message in her chart form [facility].    Windell Moulding, Please call the RN below and give orders for PT/OT/Skilled nursing/SW/Speech and a 3 in 1 shower chair.

## 2018-05-09 NOTE — Patient Instructions (Addendum)
Please increase: - Lantus 20 units in a.m.  Please start: - Humalog 4-5 units before each meal  Stop Glimepiride.  Please return in 3 months with your sugar log.

## 2018-05-09 NOTE — Telephone Encounter (Signed)
Orders for PT, OT and speech therapy given to [facility] RN at [facility].  She said they do not provide home equipment and a rx will need to be sent to [facility] for the 3 in 1 bedside commode.   Please write rx to be faxed to [facility].

## 2018-05-09 NOTE — Progress Notes (Signed)
Patient ID: Jessica Meyer, female   DOB: September 27, 1935, 83 y.o.   MRN: [id number]   Patient location: Home My location: Office  Referring Provider: Debbrah Alar, NP  I connected with the patient and her daughter [name]) on 05/09/18 at 10:23 AM EDT by a video enabled telemedicine application and verified that I am speaking with the correct person.   I discussed the limitations of evaluation and management by telemedicine and the availability of in person appointments. The patient expressed understanding and agreed to proceed.   Details of the encounter are shown below.  HPI: Jessica Meyer is a 83 y.o.-year-old female, presenting for follow-up for DM2, dx in 1990s, insulin-dependent since ~2012, uncontrolled, with complications (CKD stage 3-4). Last visit 6 months ago.  Patient is mostly nonverbal so the history is obtained from daughter.  Patient had a recent R MCA stroke on 04/04/2018 >> L arm paralysis and dysphagia >> pureed diet. She is not independent. Cognition id also affected.She was discharged from the hospital on Humalog sliding scale.  An HbA1c during hospitalization was higher.  Last hemoglobin A1c was: Lab Results  Component Value Date   HGBA1C 8.0 (H) 04/05/2018   HGBA1C 7.6 (H) 10/18/2017   HGBA1C 8.3 03/10/2017   Pt is on a regimen of: - Lantus 14 units at bedtime >> in a.m. >> 12 >> 17 units in a.m. - Started on sliding scale Humalog in the hospital She was onTradjenta 5 mg daily before breakfast-added back 11/2016>> but stopped 08/2017 2/2 cost She was on Glipizide >> hypoglycemia.  Pt checks her sugars twice a day: - am: 88-140 >> 106-196 >> 79, 96-167, 177 >> 177-330 - 2h after b'fast: n/c >> 161-231  >> n/c - before lunch:108-187, 215 >> 145-150 >> n/c >> 210-270 - 2h after lunch: n/c >> 116, 118 >> n/c - before dinner: n/c >> 119, 185 >> n/c - 2h after dinner:193-268 >> 179-295, 304, 406 >> 230-257 - bedtime: 160-187 >> 235 >> n/c - nighttime:  n/c Lows: 27 x1 in 01/2016...79 >> 177; she has hypoglycemia awareness in the 90s. Highest sugar was 406 >> 330.  Glucometer: Accu-Chek Aviva  Pt's meals are: - Breakfast: eggs + sausage and bacon - Lunch: can of soup + [place] sandwich - Dinner: canned green beans, baked potatoes, pork chops or [place] sandwich - Snacks: sandwich at bedtime (!)  -+ Stage III CKD, last BUN/creatinine:  Lab Results  Component Value Date   BUN 15 04/09/2018   BUN 15 04/07/2018   CREATININE 1.22 (H) 04/09/2018   CREATININE 1.11 (H) 04/07/2018   Lab Results  Component Value Date   GFRNONAA 42 (L) 04/09/2018   GFRNONAA 47 (L) 04/07/2018   GFRNONAA 55 (L) 04/05/2018   GFRNONAA 46 (L) 04/04/2018   GFRNONAA 34 (L) 12/02/2016   GFRNONAA 38 (L) 01/26/2016   GFRNONAA 37 (L) 01/25/2016   GFRNONAA 32 (L) 11/07/2015   Lab Results  Component Value Date   MICRALBCREAT 1.0 05/11/2015   MICRALBCREAT 1.1 07/29/2014  On lisinopril 10. -+ HL; last set of lipids: Lab Results  Component Value Date   CHOL 131 04/05/2018   HDL 42 04/05/2018   LDLCALC 63 04/05/2018   TRIG 130 04/05/2018   CHOLHDL 3.1 04/05/2018  On Lipitor 10. - last eye exam was in 04/2017: No DR -Denies numbness and tingling in her feet.  She has history of hypothyroidism, on levothyroxine, managed by PCP. Reviewed most recent TSH: Normal: Lab Results  Component Value  Date   TSH 3.45 10/18/2017   She also has HTN, GERD.  Previous cardiac investigation: Stress test 11/20/2017: normal 2D ECHO 11/16/2017: 1. Left ventricular systolic function is preserved visuallyestimated ejection fraction 60 to 65%. 2. Tiny pericardial effusion versus epicardial fat pad.  ROS: Constitutional: no weight gain/no weight loss, no fatigue, no subjective hyperthermia, no subjective hypothermia Eyes: no blurry vision, no xerophthalmia ENT: no sore throat, no nodules palpated in neck, no dysphagia, no odynophagia, no hoarseness Cardiovascular: no  CP/no SOB/no palpitations/no leg swelling Respiratory: no cough/no SOB/no wheezing Gastrointestinal: no N/no V/no D/no C/no acid reflux Musculoskeletal: no muscle aches/no joint aches Skin: no rashes, no hair loss Neurological: no tremors/no numbness/no tingling/no dizziness  I reviewed pt's medications, allergies, PMH, social hx, family hx, and changes were documented in the history of present illness. Otherwise, unchanged from my initial visit note.  Past Medical History:  Diagnosis Date  . Arthritis   . Diabetes mellitus without complication ([place])   . History of chicken pox   . Hyperlipidemia   . Hypertension   . Thyroid disease    hypothyroid   Past Surgical History:  Procedure Laterality Date  . ABDOMINAL HYSTERECTOMY    . HIP ARTHROPLASTY Right 01/26/2016   Procedure: ARTHROPLASTY BIPOLAR HIP (HEMIARTHROPLASTY);  Surgeon: Renette Butters, MD;  Location: [facility];  Service: Orthopedics;  Laterality: Right;  . IR ANGIO EXTRACRAN SEL COM CAROTID INNOMINATE UNI L MOD SED  04/05/2018  . IR CT HEAD LTD  04/05/2018  . IR PERCUTANEOUS ART THROMBECTOMY/INFUSION INTRACRANIAL INC DIAG ANGIO  04/05/2018  . KNEE ARTHROSCOPY Bilateral   . RADIOLOGY WITH ANESTHESIA N/A 04/04/2018   Procedure: RADIOLOGY WITH ANESTHESIA;  Surgeon: Radiologist, Medication, MD;  Location: [facility];  Service: Radiology;  Laterality: N/A;  . REPLACEMENT TOTAL KNEE BILATERAL     1998 left , 1992 right   Social History   Social History Main Topics  . Smoking status: Never Smoker  . Smokeless tobacco: Current User    Types: Snuff  . Alcohol use 0.6 oz/week    1 Standard drinks or equivalent per week     Comment: occasional beer, wine  . Drug use: No   Social History Narrative   Retired,  homemaker   Widow   Completed 7th grade   2 daughter   Son and daughter. Daughter local, son in [place]   4 grandchildren   Enjoys puzzles, gardening.      Current Outpatient Medications on File Prior to Visit  Medication Sig  Dispense Refill  . ACCU-CHEK AVIVA PLUS test strip USE TO TEST BLOOD SUGAR TWO TIMES A DAY 100 each 0  . Accu-Chek Softclix Lancets lancets Use as instructed to check blood sugar twice a day.  DX E11.9 200 each 1  . amLODipine (NORVASC) 5 MG tablet Take 1 tablet (5 mg total) by mouth daily. 90 tablet 1  . aspirin EC 81 MG tablet Take 81 mg by mouth daily.    Marland Kitchen atorvastatin (LIPITOR) 10 MG tablet Take 1 tablet (10 mg total) by mouth at bedtime. 90 tablet 3  . Blood Glucose Monitoring Suppl (ACCU-CHEK AVIVA PLUS) w/Device KIT USE AS DIRECTED 1 kit 0  . buPROPion (WELLBUTRIN) 100 MG tablet Take 1 tablet (100 mg total) by mouth 3 (three) times daily. 90 tablet 1  . carvedilol (COREG) 12.5 MG tablet TAKE 1 TABLET BY MOUTH TWO TIMES A DAY WITH MEAL 180 tablet 3  . clopidogrel (PLAVIX) 75 MG tablet Take 1 tablet (  75 mg total) by mouth daily. 30 tablet 0  . escitalopram (LEXAPRO) 5 MG tablet Take 1 tablet (5 mg total) by mouth daily. 30 tablet 5  . gabapentin (NEURONTIN) 400 MG capsule Take 1 capsule (400 mg total) by mouth 3 (three) times daily. 90 capsule 0  . glimepiride (AMARYL) 1 MG tablet Take 1 tablet (1 mg total) by mouth daily with breakfast. 30 tablet 1  . HYDROcodone-acetaminophen (NORCO/VICODIN) 5-325 MG tablet Take 1 tablet by mouth every 6 (six) hours as needed for severe pain. 20 tablet 0  . hydrOXYzine (ATARAX/VISTARIL) 50 MG tablet Take 1 tablet (50 mg total) by mouth at bedtime. 30 tablet 0  . Insulin Glargine (LANTUS) 100 UNIT/ML Solostar Pen Inject 12 Units into the skin at bedtime. 15 mL 11  . levothyroxine (SYNTHROID) 125 MCG tablet Take 0.5 tablets (62.5 mcg total) by mouth daily before breakfast. 30 tablet 0  . lisinopril (ZESTRIL) 40 MG tablet Take 1 tablet (40 mg total) by mouth daily. 90 tablet 0  . methocarbamol (ROBAXIN) 500 MG tablet Take 1 tablet (500 mg total) by mouth every 8 (eight) hours as needed for muscle spasms. 30 tablet 0  . nicotine (NICODERM CQ - DOSED IN MG/24  HOURS) 14 mg/24hr patch Place 1 patch (14 mg total) onto the skin daily. 30 patch 0   No current facility-administered medications on file prior to visit.    No Known Allergies Family History  Problem Relation Age of Onset  . Arthritis Mother   . Depression Mother   . Heart attack Father        20   . Hypertension Father    PE: There were no vitals taken for this visit. Wt Readings from Last 3 Encounters:  05/02/18 146 lb 3.4 oz (66.3 kg)  04/05/18 139 lb 1.8 oz (63.1 kg)  03/02/18 148 lb 9.6 oz (67.4 kg)   Constitutional:  in NAD Left hemiparesis -patient in wheelchair  The physical exam was not performed (virtual visit).  ASSESSMENT: 1. DM2, insulin-dependent, uncontrolled, with complications - CKD stage 3 >> worse lately - h/o hypoglycemia  2. HL  PLAN:  1. Patient with longstanding, uncontrolled, insulin-dependent diabetes, with worse control lately.  She has a history of severe hypoglycemia so we have to be very careful with her insulin dosing.  She had to come off Tradjenta in the past when she was in the donut hole.  At last visit we tried to add a low-dose glipizide XL as she was having increasing blood sugars as the day went by.  Her HbA1c at that time was 7.6%.  However, since then, she had a stroke and an HbA1c in the hospital was  8%, higher.  She was given rapid-acting insulin in the hospital but discharged on.  Her long-acting insulin dose was decreased to 12 units.  They increase the dose of Lantus back to 17 units after discharge from the SNF, where she went for rehab for 1 month.  Her daughter contacted me about her insulin management and we scheduled the appointment today to address this. -At this visit, sugars are much higher than before and patient is eating slightly better, pured foods, so I advised the daughter to increase her dose of Lantus to 20 units.  Also, at this point, we will need rapid acting insulin.  We discussed about adding 45 units before each  meal.  This is difficult since patient does not give herself the injections.  At next visit, we may  try to change to a 70/30 insulin regimen, although this is not ideal, to help the caregivers.  We will also stop the glimepiride.  She was on glipizide before the hospitalization but it was changed to a low dose glimepiride in the hospital. -Discussed about the fact that patient's daughter should let me know if her sugars remain uncontrolled - I suggested to:  Patient Instructions  Please increase: - Lantus 20 units in a.m.  Please start: - Humalog 4-5 units before each meal  Stop Glimepiride.  Please return in 3 months with your sugar log.   - continue checking sugars at different times of the day - check 3x a day, rotating checks - advised for yearly eye exams >> she is not UTD - Return to clinic in 3 mo with sugar log    2. HL - Reviewed latest lipid panel from last month: At goal Lab Results  Component Value Date   CHOL 131 04/05/2018   HDL 42 04/05/2018   LDLCALC 63 04/05/2018   TRIG 130 04/05/2018   CHOLHDL 3.1 04/05/2018  - Continues Lipitor without side effects.  Philemon Kingdom, MD PhD [facility]

## 2018-05-09 NOTE — Telephone Encounter (Signed)
lvm for Molly, RN with [facility] to call me back for orders. 

## 2018-05-10 ENCOUNTER — Other Ambulatory Visit: Payer: Self-pay | Admitting: Family

## 2018-05-10 NOTE — Addendum Note (Signed)
Addended by: Sandford Craze on: 05/10/2018 03:48 PM   Modules accepted: Orders

## 2018-05-11 ENCOUNTER — Telehealth: Payer: Self-pay | Admitting: Family

## 2018-05-11 ENCOUNTER — Other Ambulatory Visit: Payer: Medicare Other

## 2018-05-11 ENCOUNTER — Other Ambulatory Visit: Payer: Self-pay

## 2018-05-11 DIAGNOSIS — I69391 Dysphagia following cerebral infarction: Secondary | ICD-10-CM | POA: Diagnosis not present

## 2018-05-11 DIAGNOSIS — I129 Hypertensive chronic kidney disease with stage 1 through stage 4 chronic kidney disease, or unspecified chronic kidney disease: Secondary | ICD-10-CM | POA: Diagnosis not present

## 2018-05-11 DIAGNOSIS — E1122 Type 2 diabetes mellitus with diabetic chronic kidney disease: Secondary | ICD-10-CM | POA: Diagnosis not present

## 2018-05-11 DIAGNOSIS — R1310 Dysphagia, unspecified: Secondary | ICD-10-CM | POA: Diagnosis not present

## 2018-05-11 DIAGNOSIS — R350 Frequency of micturition: Secondary | ICD-10-CM

## 2018-05-11 DIAGNOSIS — I69354 Hemiplegia and hemiparesis following cerebral infarction affecting left non-dominant side: Secondary | ICD-10-CM | POA: Diagnosis not present

## 2018-05-11 DIAGNOSIS — N183 Chronic kidney disease, stage 3 (moderate): Secondary | ICD-10-CM | POA: Diagnosis not present

## 2018-05-11 NOTE — Telephone Encounter (Unsigned)
Copied from CRM [id number]. Topic: Quick Communication - Home Health Verbal Orders >> May 11, 2018  4:19 PM Marylen Ponto wrote: Caller/Agency: Mellody Memos with [facility] Number: [phone] Requesting OT/PT/Skilled Nursing/Social Work/Speech Therapy: OT Frequency: 1 time a week for 5 weeks

## 2018-05-11 NOTE — Telephone Encounter (Signed)
-  RX for 3 in 1 bedside comode faxed to [facility]  At [phone] with patient's demographics and insurance information.

## 2018-05-11 NOTE — Telephone Encounter (Signed)
Gold DNR form filled and left at front desk. Rx written for 3 in 1 bedside commode. Please fax rx to [facility].

## 2018-05-14 ENCOUNTER — Telehealth: Payer: Medicare Other | Admitting: Family

## 2018-05-14 ENCOUNTER — Telehealth: Payer: Self-pay | Admitting: Family

## 2018-05-14 ENCOUNTER — Telehealth: Payer: Self-pay | Admitting: *Deleted

## 2018-05-14 ENCOUNTER — Other Ambulatory Visit: Payer: Self-pay | Admitting: Family

## 2018-05-14 DIAGNOSIS — I129 Hypertensive chronic kidney disease with stage 1 through stage 4 chronic kidney disease, or unspecified chronic kidney disease: Secondary | ICD-10-CM | POA: Diagnosis not present

## 2018-05-14 DIAGNOSIS — R1310 Dysphagia, unspecified: Secondary | ICD-10-CM | POA: Diagnosis not present

## 2018-05-14 DIAGNOSIS — E1122 Type 2 diabetes mellitus with diabetic chronic kidney disease: Secondary | ICD-10-CM | POA: Diagnosis not present

## 2018-05-14 DIAGNOSIS — N183 Chronic kidney disease, stage 3 (moderate): Secondary | ICD-10-CM | POA: Diagnosis not present

## 2018-05-14 DIAGNOSIS — I69354 Hemiplegia and hemiparesis following cerebral infarction affecting left non-dominant side: Secondary | ICD-10-CM | POA: Diagnosis not present

## 2018-05-14 DIAGNOSIS — I69391 Dysphagia following cerebral infarction: Secondary | ICD-10-CM | POA: Diagnosis not present

## 2018-05-14 LAB — URINE CULTURE
MICRO NUMBER:: 458417
SPECIMEN QUALITY:: ADEQUATE

## 2018-05-14 MED ORDER — CEPHALEXIN 500 MG PO CAPS: 500.0000 mg | ORAL_CAPSULE | Freq: Two times a day (BID) | ORAL | 0 refills | Status: AC

## 2018-05-14 NOTE — Telephone Encounter (Signed)
Received Physician Orders from [facility]; forwarded to provider/SLS 05/11

## 2018-05-14 NOTE — Telephone Encounter (Signed)
Please advise 

## 2018-05-14 NOTE — Telephone Encounter (Signed)
Spoke to daughter. Advised her urine culture is + for UTI. She reports that pt seems to be less confused today. She will have her begin keflex for UTI.

## 2018-05-14 NOTE — Telephone Encounter (Signed)
Spoke to RN and she states that pt does not have much skilled need and only has so many visits allowed for all disciplines. They would like to reserve these visits for PT/OT/ST- approved order to d/c aid/and limint RN visits to every other week.

## 2018-05-14 NOTE — Telephone Encounter (Signed)
Copied from CRM [id number]. Topic: Quick Communication - Rx Refill/Question >> May 14, 2018  8:11 AM Louie Bun, Rosey Bath D wrote: Medication: cephALEXin (KEFLEX) 500 MG capsuels  Has the patient contacted their pharmacy? Yes, pharmacy wants to know if instruction meant to say for 7 days and not 5 days due to the quantity. (Agent: If no, request that the patient contact the pharmacy for the refill.) (Agent: If yes, when and what did the pharmacy advise?)  Preferred Pharmacy (with phone number or street name): [facility] [address] [place], [place] - [address]  Agent: Please be advised that RX refills may take up to 3 business days. We ask that you follow-up with your pharmacy.

## 2018-05-14 NOTE — Telephone Encounter (Signed)
Contacted pharmacy and clarified that rx should be for 5 days course, #10.

## 2018-05-14 NOTE — Telephone Encounter (Signed)
Copied from CRM [id number]. Topic: Quick Communication - Home Health Verbal Orders >> May 14, 2018  9:26 AM Percival Spanish wrote: Jessica Meyer with [facility]    Callback Number [phone]     Requesting   verbal orders for decreased skill nursing to 1 x every other week, discontinue home health

## 2018-05-14 NOTE — Telephone Encounter (Signed)
Please give verbal order for PT/OT/Skilled nursing/SW/Speech therapy

## 2018-05-15 ENCOUNTER — Encounter: Payer: Medicare Other | Admitting: Physical Medicine & Rehabilitation

## 2018-05-15 DIAGNOSIS — I69391 Dysphagia following cerebral infarction: Secondary | ICD-10-CM | POA: Diagnosis not present

## 2018-05-15 DIAGNOSIS — R1310 Dysphagia, unspecified: Secondary | ICD-10-CM | POA: Diagnosis not present

## 2018-05-15 DIAGNOSIS — I129 Hypertensive chronic kidney disease with stage 1 through stage 4 chronic kidney disease, or unspecified chronic kidney disease: Secondary | ICD-10-CM | POA: Diagnosis not present

## 2018-05-15 DIAGNOSIS — N183 Chronic kidney disease, stage 3 (moderate): Secondary | ICD-10-CM | POA: Diagnosis not present

## 2018-05-15 DIAGNOSIS — E1122 Type 2 diabetes mellitus with diabetic chronic kidney disease: Secondary | ICD-10-CM | POA: Diagnosis not present

## 2018-05-15 DIAGNOSIS — I69354 Hemiplegia and hemiparesis following cerebral infarction affecting left non-dominant side: Secondary | ICD-10-CM | POA: Diagnosis not present

## 2018-05-15 NOTE — Telephone Encounter (Signed)
Verbal order given via via voicemail.

## 2018-05-16 ENCOUNTER — Telehealth: Payer: Self-pay | Admitting: Family

## 2018-05-16 NOTE — Telephone Encounter (Signed)
LMOM w/ verbal orders.  

## 2018-05-16 NOTE — Telephone Encounter (Signed)
Copied from CRM [id number]. Topic: Quick Communication - Home Health Verbal Orders >> May 16, 2018 12:22 PM Jaquita Rector A wrote: Caller/Agency: Denny Peon / [facility] Number: [phone] ok to LM Requesting OT/PT/Skilled Nursing/Social Work/Speech Therapy: Speech Therapy Frequency: 1 wk 1, 2 wk 3

## 2018-05-17 DIAGNOSIS — I129 Hypertensive chronic kidney disease with stage 1 through stage 4 chronic kidney disease, or unspecified chronic kidney disease: Secondary | ICD-10-CM | POA: Diagnosis not present

## 2018-05-17 DIAGNOSIS — R1310 Dysphagia, unspecified: Secondary | ICD-10-CM | POA: Diagnosis not present

## 2018-05-17 DIAGNOSIS — N183 Chronic kidney disease, stage 3 (moderate): Secondary | ICD-10-CM | POA: Diagnosis not present

## 2018-05-17 DIAGNOSIS — E1122 Type 2 diabetes mellitus with diabetic chronic kidney disease: Secondary | ICD-10-CM | POA: Diagnosis not present

## 2018-05-17 DIAGNOSIS — I69391 Dysphagia following cerebral infarction: Secondary | ICD-10-CM | POA: Diagnosis not present

## 2018-05-17 DIAGNOSIS — I69354 Hemiplegia and hemiparesis following cerebral infarction affecting left non-dominant side: Secondary | ICD-10-CM | POA: Diagnosis not present

## 2018-05-21 ENCOUNTER — Telehealth: Payer: Self-pay

## 2018-05-21 ENCOUNTER — Encounter: Payer: Self-pay | Admitting: Neurology

## 2018-05-21 ENCOUNTER — Other Ambulatory Visit: Payer: Self-pay

## 2018-05-21 ENCOUNTER — Ambulatory Visit (INDEPENDENT_AMBULATORY_CARE_PROVIDER_SITE_OTHER): Payer: Medicare Other | Admitting: Neurology

## 2018-05-21 DIAGNOSIS — E1122 Type 2 diabetes mellitus with diabetic chronic kidney disease: Secondary | ICD-10-CM | POA: Diagnosis not present

## 2018-05-21 DIAGNOSIS — N183 Chronic kidney disease, stage 3 (moderate): Secondary | ICD-10-CM | POA: Diagnosis not present

## 2018-05-21 DIAGNOSIS — I639 Cerebral infarction, unspecified: Secondary | ICD-10-CM

## 2018-05-21 DIAGNOSIS — I69354 Hemiplegia and hemiparesis following cerebral infarction affecting left non-dominant side: Secondary | ICD-10-CM | POA: Diagnosis not present

## 2018-05-21 DIAGNOSIS — I69391 Dysphagia following cerebral infarction: Secondary | ICD-10-CM | POA: Diagnosis not present

## 2018-05-21 DIAGNOSIS — I129 Hypertensive chronic kidney disease with stage 1 through stage 4 chronic kidney disease, or unspecified chronic kidney disease: Secondary | ICD-10-CM | POA: Diagnosis not present

## 2018-05-21 DIAGNOSIS — I63231 Cerebral infarction due to unspecified occlusion or stenosis of right carotid arteries: Secondary | ICD-10-CM | POA: Diagnosis not present

## 2018-05-21 DIAGNOSIS — R1310 Dysphagia, unspecified: Secondary | ICD-10-CM | POA: Diagnosis not present

## 2018-05-21 NOTE — Telephone Encounter (Signed)
I called pts daughter [name] to verify email. I sent doxy email link to her today for visit at 0100pm for her mom.

## 2018-05-21 NOTE — Progress Notes (Signed)
Virtual Visit via Video Note  I connected with Jessica Meyer on 05/21/18 at  1:00 PM EDT by a video enabled telemedicine application and verified that I am speaking with the correct person using two identifiers.  Location: Patient: patient at her daughter`s home [facility]   I discussed the limitations of evaluation and management by telemedicine and the availability of in person appointments. The patient expressed understanding and agreed to proceed.  This visit was performed using [email or web address] after for audio and visual The visit was facilitated by patient daughter and her son who were present throughout this visit History of Present Illness: Ms Jessica Meyer  is a 83 year old pleasant Caucasian lady was seen today for initial office virtual video follow-up visit following hospital admission for stroke on 04/04/2018.   She has past medical history of hypertension, hyperlipidemia, diabetes, thyroid disease and arthritis.  She presented with sudden onset of left hemiplegia and presented beyond time window for TPA.  At baseline she was completely independent and manages all her activities of daily living and lives alone.  CT scan of the head on admission was unremarkable and initial CT angios and perfusion were suboptimal.  A repeat study showed relatively small cord with a large penumbra with CT angios showing right carotid occlusion.  Patient was taken for attempted mechanical thrombectomy but procedure was unsuccessful.  She was also found to have severe proximal left ICA stenosis as well.  MRI scan of the brain showed moderate sized right MCA infarct with additional multiple cerebral infarcts chronic lacunar infarcts.  Transthoracic echo showed normal ejection fraction.  LDL cholesterol was 63 mg percent.  Hemoglobin A1c was 8.4.  Patient started on aspirin and Plavix.  She was seen by physical occupational and speech therapy and transferred to inpatient rehab.  DNR as per her request.  Patient spent  several weeks in inpatient rehab but did not make much improvement.  Due to concerns about the COVID pandemic and going to a nursing home family chose to move to her daughter's home where she has been for last 2 weeks.  Unfortunately she has not obtained significant improvement.  He remains total assist.  Still has left-sided neglect and gaze and activation.  Work with the therapist.  There is some weight on the leg.  His maximum assist.  Tolerating aspirin and Plavix without bleeding or bruising.  She complains of some headache.  She gets gabapentin 300 mg 3  times daily.  He had prescription for Norco has not been given due to sleepiness side effect patient is now living with her daughter.  She has a health aide during daytime hours. Observations/Objective: Physical and neurological exams are limited due to constraints from video visit.  Pleasant frail elderly Caucasian lady not in distress.  Sitting in a wheelchair.  She is slumped to the left with head  deviated to the left.  She is awake alert oriented to person and place.  There is no aphasia apraxia or dysarthria.  She has diminished attention, registration and recall.  She has mild left-sided hemineglect but can attend to the left with problems.  There appears some left visual neglect as well.  There is left lower facial asymmetry.  Tongue is midline.  She has dense left hemiplegia with no voluntary movements.  She has antigravity purposeful movements on the right side.  Gait was not tested.  Assessment   83 year old lady with right MCA and bilateral cerebral infarcts in the setting of right ICA and  MCA occlusion and severe proximal left carotid stenosis status post attempted unsuccessful mechanical thrombectomy in April 2020. Vascular risk factors of carotid stenosis, diabetes, hypertension, hyperlipidemia and carotid disease. PLAN : I had a long discussion with the patient and her daughter and son regarding her large stroke and significant  neurological disability which has persisted.  I recommend she continue aspirin and Plavix for a total of 3 months from her stroke and then discontinue Plavix and stay on aspirin alone.  Maintain aggressive risk factor modification with strict control of diabetes with hemoglobin A1c goal below 6.5, lipids with LDL cholesterol goal below 70 mg percent and hypertension with blood pressure goal below 130/90.  I have also encouraged that she continue home physical and occupational therapy and work towards assisting with transfers and getting up and hopefully walking soon.  The patient has high-grade proximal left carotid stenosis which is symptomatic but given her poor functional baseline at present I do not believe she is a candidate for elective revascularization.  If however she shows significant improvement over the next few months may reconsider left carotid angioplasty/stenting. Follow Up Instructions: Follow-up in stroke clinic in 2 months. Continue home PT/OT   I discussed the assessment and treatment plan with the patient. The patient was provided an opportunity to ask questions and all were answered. The patient agreed with the plan and demonstrated an understanding of the instructions.   The patient was advised to call back or seek an in-person evaluation if the symptoms worsen or if the condition fails to improve as anticipated.  I provided 25 minutes of non-face-to-face time during this encounter.   Delia Heady, MD

## 2018-05-22 DIAGNOSIS — I69391 Dysphagia following cerebral infarction: Secondary | ICD-10-CM | POA: Diagnosis not present

## 2018-05-22 DIAGNOSIS — I129 Hypertensive chronic kidney disease with stage 1 through stage 4 chronic kidney disease, or unspecified chronic kidney disease: Secondary | ICD-10-CM | POA: Diagnosis not present

## 2018-05-22 DIAGNOSIS — E1122 Type 2 diabetes mellitus with diabetic chronic kidney disease: Secondary | ICD-10-CM | POA: Diagnosis not present

## 2018-05-22 DIAGNOSIS — I69354 Hemiplegia and hemiparesis following cerebral infarction affecting left non-dominant side: Secondary | ICD-10-CM | POA: Diagnosis not present

## 2018-05-22 DIAGNOSIS — N183 Chronic kidney disease, stage 3 (moderate): Secondary | ICD-10-CM | POA: Diagnosis not present

## 2018-05-22 DIAGNOSIS — R1310 Dysphagia, unspecified: Secondary | ICD-10-CM | POA: Diagnosis not present

## 2018-05-23 DIAGNOSIS — E1122 Type 2 diabetes mellitus with diabetic chronic kidney disease: Secondary | ICD-10-CM | POA: Diagnosis not present

## 2018-05-23 DIAGNOSIS — I129 Hypertensive chronic kidney disease with stage 1 through stage 4 chronic kidney disease, or unspecified chronic kidney disease: Secondary | ICD-10-CM | POA: Diagnosis not present

## 2018-05-23 DIAGNOSIS — I69354 Hemiplegia and hemiparesis following cerebral infarction affecting left non-dominant side: Secondary | ICD-10-CM | POA: Diagnosis not present

## 2018-05-23 DIAGNOSIS — I69391 Dysphagia following cerebral infarction: Secondary | ICD-10-CM | POA: Diagnosis not present

## 2018-05-23 DIAGNOSIS — N183 Chronic kidney disease, stage 3 (moderate): Secondary | ICD-10-CM | POA: Diagnosis not present

## 2018-05-23 DIAGNOSIS — R1310 Dysphagia, unspecified: Secondary | ICD-10-CM | POA: Diagnosis not present

## 2018-05-24 DIAGNOSIS — N183 Chronic kidney disease, stage 3 (moderate): Secondary | ICD-10-CM | POA: Diagnosis not present

## 2018-05-24 DIAGNOSIS — I69391 Dysphagia following cerebral infarction: Secondary | ICD-10-CM | POA: Diagnosis not present

## 2018-05-24 DIAGNOSIS — I129 Hypertensive chronic kidney disease with stage 1 through stage 4 chronic kidney disease, or unspecified chronic kidney disease: Secondary | ICD-10-CM | POA: Diagnosis not present

## 2018-05-24 DIAGNOSIS — R1310 Dysphagia, unspecified: Secondary | ICD-10-CM | POA: Diagnosis not present

## 2018-05-24 DIAGNOSIS — E1122 Type 2 diabetes mellitus with diabetic chronic kidney disease: Secondary | ICD-10-CM | POA: Diagnosis not present

## 2018-05-24 DIAGNOSIS — I69354 Hemiplegia and hemiparesis following cerebral infarction affecting left non-dominant side: Secondary | ICD-10-CM | POA: Diagnosis not present

## 2018-05-29 ENCOUNTER — Ambulatory Visit (INDEPENDENT_AMBULATORY_CARE_PROVIDER_SITE_OTHER): Payer: Medicare Other | Admitting: Family

## 2018-05-29 ENCOUNTER — Other Ambulatory Visit: Payer: Self-pay

## 2018-05-29 DIAGNOSIS — N183 Chronic kidney disease, stage 3 unspecified: Secondary | ICD-10-CM

## 2018-05-29 DIAGNOSIS — E039 Hypothyroidism, unspecified: Secondary | ICD-10-CM

## 2018-05-29 DIAGNOSIS — I10 Essential (primary) hypertension: Secondary | ICD-10-CM

## 2018-05-29 DIAGNOSIS — I639 Cerebral infarction, unspecified: Secondary | ICD-10-CM | POA: Diagnosis not present

## 2018-05-29 DIAGNOSIS — F418 Other specified anxiety disorders: Secondary | ICD-10-CM

## 2018-05-29 DIAGNOSIS — I129 Hypertensive chronic kidney disease with stage 1 through stage 4 chronic kidney disease, or unspecified chronic kidney disease: Secondary | ICD-10-CM | POA: Diagnosis not present

## 2018-05-29 DIAGNOSIS — Z794 Long term (current) use of insulin: Secondary | ICD-10-CM | POA: Diagnosis not present

## 2018-05-29 DIAGNOSIS — I6529 Occlusion and stenosis of unspecified carotid artery: Secondary | ICD-10-CM

## 2018-05-29 DIAGNOSIS — I6601 Occlusion and stenosis of right middle cerebral artery: Secondary | ICD-10-CM | POA: Diagnosis not present

## 2018-05-29 DIAGNOSIS — R1310 Dysphagia, unspecified: Secondary | ICD-10-CM | POA: Diagnosis not present

## 2018-05-29 DIAGNOSIS — E1122 Type 2 diabetes mellitus with diabetic chronic kidney disease: Secondary | ICD-10-CM

## 2018-05-29 DIAGNOSIS — I69391 Dysphagia following cerebral infarction: Secondary | ICD-10-CM | POA: Diagnosis not present

## 2018-05-29 DIAGNOSIS — M25562 Pain in left knee: Secondary | ICD-10-CM

## 2018-05-29 DIAGNOSIS — I69354 Hemiplegia and hemiparesis following cerebral infarction affecting left non-dominant side: Secondary | ICD-10-CM | POA: Diagnosis not present

## 2018-05-29 MED ORDER — ESCITALOPRAM OXALATE 10 MG PO TABS: 10.0000 mg | ORAL_TABLET | Freq: Every day | ORAL | 3 refills | Status: AC

## 2018-05-29 NOTE — Progress Notes (Signed)
Virtual Visit via Video Note  I connected with Jessica Meyer on 05/29/18 at  5:00 PM EDT by a video enabled telemedicine application and verified that I am speaking with the correct person using two identifiers. This visit type was conducted due to national recommendations for restrictions regarding the COVID-19 Pandemic (e.g. social distancing).  This format is felt to be most appropriate for this patient at this time.   I discussed the limitations of evaluation and management by telemedicine and the availability of in person appointments. The patient expressed understanding and agreed to proceed.  Only the patient and myself were on today's video visit. The patient was at home and I was in my office at the time of today's visit.   History of Present Illness:  Patient presents today for routine followup.  1) CVA- continues to need full assistance at home. PT/OT continue to come to the home. She is living with her daughter. She had a virtual follow up with neurology on 5/18 which is reviewed. She is maintained on aspirin and plavix.  Daughter feels that her cognition has really improved over the last few weeks and she is interested in exploring SNF rehabs now that she could better participate in the therapy.   2) Hypothyroid- maintained on synthroid 125 mcg once daily.  Lab Results  Component Value Date   TSH 3.45 10/18/2017   3) Depression/Anxiety- maintained on lexapro.  Feels more anxious lately. Mood is good however.   4) HTN- bp meds include coreg, amlodipine,  BP Readings from Last 3 Encounters:  05/05/18 124/61  04/07/18 114/68  03/02/18 131/61   5) DM2- following with endocrinology.  Lab Results  Component Value Date   HGBA1C 8.0 (H) 04/05/2018   6) Carotid stenosis- per neuro note it is not felt that she would be a good candidate for elective revascularization due to her poor functional baseline but that if she shows significant improvement over the next few months they may  reconsider L carotid angioplasty/stenting.      Observations/Objective:   Gen: Awake, alert, no acute distress Resp: Breathing is even and non-labored Psych: calm/pleasant demeanor Neuro:  + left sided facial droop (mild), speech is dysarthric. Pt is leaning toward the left in her seat.   Assessment and Plan:  CVA- maintained on ASA/Plavix, cognition is improving but she is still a total assist requiring a hoyer lift.  I have filled an FL2 and left it at the front desk for the daughter to pick up. She is going to look into snf rehabs. She has until 30 days after her discharge per medicare guidelines.  She was discharged on 5/2. Daughter is aware of this time constraint.  Anxiety/depression- pt reports that she is having difficulty sleeping.  Feels like she is more anxious. Will increase lexapro from 5mg  to 10mg .  Could consider decreasing wellbutrin if she responds as this may be contributing to her insomnia.   Hypothyroid- clinically stable on synthroid.  Continue same.   HTN- bp has been stable on current meds. Continue same.  Carotid stenosis- not a good surgical candidate at this time. Continue med management. (statin/asa/plavix)  Follow Up Instructions:    I discussed the assessment and treatment plan with the patient. The patient was provided an opportunity to ask questions and all were answered. The patient agreed with the plan and demonstrated an understanding of the instructions.   The patient was advised to call back or seek an in-person evaluation if the symptoms worsen or  if the condition fails to improve as anticipated.    Lemont FillersMelissa S O'Sullivan, NP

## 2018-05-30 ENCOUNTER — Telehealth: Payer: Self-pay | Admitting: Family

## 2018-05-30 DIAGNOSIS — I129 Hypertensive chronic kidney disease with stage 1 through stage 4 chronic kidney disease, or unspecified chronic kidney disease: Secondary | ICD-10-CM | POA: Diagnosis not present

## 2018-05-30 DIAGNOSIS — I69354 Hemiplegia and hemiparesis following cerebral infarction affecting left non-dominant side: Secondary | ICD-10-CM | POA: Diagnosis not present

## 2018-05-30 DIAGNOSIS — E1122 Type 2 diabetes mellitus with diabetic chronic kidney disease: Secondary | ICD-10-CM | POA: Diagnosis not present

## 2018-05-30 DIAGNOSIS — N183 Chronic kidney disease, stage 3 (moderate): Secondary | ICD-10-CM | POA: Diagnosis not present

## 2018-05-30 DIAGNOSIS — I69391 Dysphagia following cerebral infarction: Secondary | ICD-10-CM | POA: Diagnosis not present

## 2018-05-30 DIAGNOSIS — R1310 Dysphagia, unspecified: Secondary | ICD-10-CM | POA: Diagnosis not present

## 2018-05-30 NOTE — Telephone Encounter (Signed)
Copied from CRM [id number]. Topic: Quick Communication - Home Health Verbal Orders >> May 30, 2018 10:31 AM Debroah Loop wrote: Caller/Agency: Stefano Gaul Number: [phone] Requesting OT/PT/Skilled Nursing/Social Work/Speech Therapy: discontinue skilled nursing, verbal for social worker Frequency: discontinue skilled nursing and verbal needed for social worker

## 2018-05-31 ENCOUNTER — Telehealth: Payer: Self-pay | Admitting: Family

## 2018-05-31 DIAGNOSIS — N183 Chronic kidney disease, stage 3 (moderate): Secondary | ICD-10-CM | POA: Diagnosis not present

## 2018-05-31 DIAGNOSIS — I129 Hypertensive chronic kidney disease with stage 1 through stage 4 chronic kidney disease, or unspecified chronic kidney disease: Secondary | ICD-10-CM | POA: Diagnosis not present

## 2018-05-31 DIAGNOSIS — E1122 Type 2 diabetes mellitus with diabetic chronic kidney disease: Secondary | ICD-10-CM | POA: Diagnosis not present

## 2018-05-31 DIAGNOSIS — I69391 Dysphagia following cerebral infarction: Secondary | ICD-10-CM | POA: Diagnosis not present

## 2018-05-31 DIAGNOSIS — R1310 Dysphagia, unspecified: Secondary | ICD-10-CM | POA: Diagnosis not present

## 2018-05-31 DIAGNOSIS — I69354 Hemiplegia and hemiparesis following cerebral infarction affecting left non-dominant side: Secondary | ICD-10-CM | POA: Diagnosis not present

## 2018-05-31 NOTE — Telephone Encounter (Signed)
Copied from CRM [id number]. Topic: Quick Communication - Home Health Verbal Orders >> May 31, 2018 11:24 AM Gwenlyn Fudge A wrote: Caller/Agency: Erin [facility] for [facility] Callback Number: [phone] Requesting OT/PT/Skilled Nursing/Social Work/Speech Therapy: Child psychotherapist Frequency: add on evaluation

## 2018-06-02 DIAGNOSIS — E1122 Type 2 diabetes mellitus with diabetic chronic kidney disease: Secondary | ICD-10-CM | POA: Diagnosis not present

## 2018-06-02 DIAGNOSIS — N183 Chronic kidney disease, stage 3 (moderate): Secondary | ICD-10-CM | POA: Diagnosis not present

## 2018-06-02 DIAGNOSIS — I69354 Hemiplegia and hemiparesis following cerebral infarction affecting left non-dominant side: Secondary | ICD-10-CM | POA: Diagnosis not present

## 2018-06-02 DIAGNOSIS — I129 Hypertensive chronic kidney disease with stage 1 through stage 4 chronic kidney disease, or unspecified chronic kidney disease: Secondary | ICD-10-CM | POA: Diagnosis not present

## 2018-06-02 DIAGNOSIS — I69391 Dysphagia following cerebral infarction: Secondary | ICD-10-CM | POA: Diagnosis not present

## 2018-06-02 DIAGNOSIS — R1310 Dysphagia, unspecified: Secondary | ICD-10-CM | POA: Diagnosis not present

## 2018-06-04 ENCOUNTER — Encounter: Payer: Self-pay | Admitting: Family

## 2018-06-04 ENCOUNTER — Encounter: Payer: Medicare Other | Attending: Physical Medicine & Rehabilitation | Admitting: Physical Medicine & Rehabilitation

## 2018-06-04 ENCOUNTER — Other Ambulatory Visit: Payer: Self-pay

## 2018-06-04 ENCOUNTER — Encounter: Payer: Self-pay | Admitting: Physical Medicine & Rehabilitation

## 2018-06-04 VITALS — BP 106/70 | Ht 69.0 in | Wt 140.0 lb

## 2018-06-04 DIAGNOSIS — I129 Hypertensive chronic kidney disease with stage 1 through stage 4 chronic kidney disease, or unspecified chronic kidney disease: Secondary | ICD-10-CM | POA: Diagnosis not present

## 2018-06-04 DIAGNOSIS — F329 Major depressive disorder, single episode, unspecified: Secondary | ICD-10-CM | POA: Diagnosis not present

## 2018-06-04 DIAGNOSIS — R0989 Other specified symptoms and signs involving the circulatory and respiratory systems: Secondary | ICD-10-CM | POA: Diagnosis not present

## 2018-06-04 DIAGNOSIS — R05 Cough: Secondary | ICD-10-CM | POA: Diagnosis not present

## 2018-06-04 DIAGNOSIS — E119 Type 2 diabetes mellitus without complications: Secondary | ICD-10-CM | POA: Diagnosis not present

## 2018-06-04 DIAGNOSIS — Z5181 Encounter for therapeutic drug level monitoring: Secondary | ICD-10-CM | POA: Diagnosis not present

## 2018-06-04 DIAGNOSIS — E1122 Type 2 diabetes mellitus with diabetic chronic kidney disease: Secondary | ICD-10-CM | POA: Diagnosis not present

## 2018-06-04 DIAGNOSIS — I69391 Dysphagia following cerebral infarction: Secondary | ICD-10-CM | POA: Diagnosis not present

## 2018-06-04 DIAGNOSIS — F339 Major depressive disorder, recurrent, unspecified: Secondary | ICD-10-CM | POA: Diagnosis not present

## 2018-06-04 DIAGNOSIS — M25512 Pain in left shoulder: Secondary | ICD-10-CM | POA: Diagnosis not present

## 2018-06-04 DIAGNOSIS — I6930 Unspecified sequelae of cerebral infarction: Secondary | ICD-10-CM | POA: Diagnosis not present

## 2018-06-04 DIAGNOSIS — E039 Hypothyroidism, unspecified: Secondary | ICD-10-CM | POA: Diagnosis not present

## 2018-06-04 DIAGNOSIS — Z7401 Bed confinement status: Secondary | ICD-10-CM | POA: Diagnosis not present

## 2018-06-04 DIAGNOSIS — I10 Essential (primary) hypertension: Secondary | ICD-10-CM | POA: Diagnosis not present

## 2018-06-04 DIAGNOSIS — N189 Chronic kidney disease, unspecified: Secondary | ICD-10-CM | POA: Diagnosis not present

## 2018-06-04 DIAGNOSIS — R2689 Other abnormalities of gait and mobility: Secondary | ICD-10-CM | POA: Diagnosis not present

## 2018-06-04 DIAGNOSIS — I2510 Atherosclerotic heart disease of native coronary artery without angina pectoris: Secondary | ICD-10-CM | POA: Diagnosis not present

## 2018-06-04 DIAGNOSIS — G8114 Spastic hemiplegia affecting left nondominant side: Secondary | ICD-10-CM | POA: Diagnosis not present

## 2018-06-04 DIAGNOSIS — E1149 Type 2 diabetes mellitus with other diabetic neurological complication: Secondary | ICD-10-CM | POA: Diagnosis not present

## 2018-06-04 DIAGNOSIS — I6339 Cerebral infarction due to thrombosis of other cerebral artery: Secondary | ICD-10-CM

## 2018-06-04 DIAGNOSIS — G8929 Other chronic pain: Secondary | ICD-10-CM | POA: Diagnosis not present

## 2018-06-04 DIAGNOSIS — I69354 Hemiplegia and hemiparesis following cerebral infarction affecting left non-dominant side: Secondary | ICD-10-CM | POA: Diagnosis not present

## 2018-06-04 DIAGNOSIS — D72829 Elevated white blood cell count, unspecified: Secondary | ICD-10-CM | POA: Diagnosis not present

## 2018-06-04 DIAGNOSIS — F419 Anxiety disorder, unspecified: Secondary | ICD-10-CM | POA: Diagnosis not present

## 2018-06-04 DIAGNOSIS — M2550 Pain in unspecified joint: Secondary | ICD-10-CM | POA: Diagnosis not present

## 2018-06-04 DIAGNOSIS — R1312 Dysphagia, oropharyngeal phase: Secondary | ICD-10-CM | POA: Diagnosis not present

## 2018-06-04 DIAGNOSIS — F0151 Vascular dementia with behavioral disturbance: Secondary | ICD-10-CM | POA: Diagnosis not present

## 2018-06-04 DIAGNOSIS — E038 Other specified hypothyroidism: Secondary | ICD-10-CM | POA: Diagnosis not present

## 2018-06-04 DIAGNOSIS — Z794 Long term (current) use of insulin: Secondary | ICD-10-CM | POA: Diagnosis not present

## 2018-06-04 DIAGNOSIS — S43032S Inferior subluxation of left humerus, sequela: Secondary | ICD-10-CM | POA: Diagnosis not present

## 2018-06-04 DIAGNOSIS — R52 Pain, unspecified: Secondary | ICD-10-CM | POA: Diagnosis not present

## 2018-06-04 DIAGNOSIS — E785 Hyperlipidemia, unspecified: Secondary | ICD-10-CM | POA: Diagnosis not present

## 2018-06-04 DIAGNOSIS — R278 Other lack of coordination: Secondary | ICD-10-CM | POA: Diagnosis not present

## 2018-06-04 DIAGNOSIS — R4182 Altered mental status, unspecified: Secondary | ICD-10-CM | POA: Diagnosis not present

## 2018-06-04 DIAGNOSIS — M79605 Pain in left leg: Secondary | ICD-10-CM | POA: Diagnosis not present

## 2018-06-04 DIAGNOSIS — N183 Chronic kidney disease, stage 3 (moderate): Secondary | ICD-10-CM | POA: Diagnosis not present

## 2018-06-04 DIAGNOSIS — R451 Restlessness and agitation: Secondary | ICD-10-CM | POA: Diagnosis not present

## 2018-06-04 DIAGNOSIS — G459 Transient cerebral ischemic attack, unspecified: Secondary | ICD-10-CM | POA: Diagnosis not present

## 2018-06-04 DIAGNOSIS — R41841 Cognitive communication deficit: Secondary | ICD-10-CM | POA: Diagnosis not present

## 2018-06-04 DIAGNOSIS — R0902 Hypoxemia: Secondary | ICD-10-CM | POA: Diagnosis not present

## 2018-06-04 DIAGNOSIS — M6281 Muscle weakness (generalized): Secondary | ICD-10-CM | POA: Diagnosis not present

## 2018-06-04 DIAGNOSIS — N390 Urinary tract infection, site not specified: Secondary | ICD-10-CM | POA: Diagnosis not present

## 2018-06-04 DIAGNOSIS — I63511 Cerebral infarction due to unspecified occlusion or stenosis of right middle cerebral artery: Secondary | ICD-10-CM | POA: Diagnosis not present

## 2018-06-04 DIAGNOSIS — R1310 Dysphagia, unspecified: Secondary | ICD-10-CM | POA: Diagnosis not present

## 2018-06-04 DIAGNOSIS — R300 Dysuria: Secondary | ICD-10-CM | POA: Diagnosis not present

## 2018-06-04 DIAGNOSIS — M545 Low back pain: Secondary | ICD-10-CM | POA: Diagnosis not present

## 2018-06-04 NOTE — Progress Notes (Signed)
Subjective:  Phone visit brief with patient's daughter Rodena MedinDeborah Grant  Patient ID: Jessica Meyer, female    DOB: 01/26/1935, 83 y.o.   MRN: [id number] Phone call during the time of scheduled phone visit. HPI Patient is now being taken into the ambulance to go to the skilled nursing facility.  I spoke briefly with her daughter Eunice BlaseDebbie.  Patient is going to the facility to get additional therapy.  We discussed that this should benefit the patient in terms of giving increase intensity of therapy.  We also discussed follow-up in physical medicine rehab clinic   Pain Inventory Average Pain 5 Pain Right Now 5 My pain is constant, dull and aching  In the last 24 hours, has pain interfered with the following? General activity 4 Relation with others 5 Enjoyment of life 5 What TIME of day is your pain at its worst? night Sleep (in general) Fair  Pain is worse with: walking, standing and some activites Pain improves with: rest and medication Relief from Meds: 4  Mobility use a wheelchair  Function I need assistance with the following:  feeding, dressing, bathing, toileting, meal prep, household duties and shopping  Neuro/Psych bladder control problems bowel control problems weakness numbness tingling trouble walking spasms  Prior Studies Any changes since last visit?  no  Physicians involved in your care Any changes since last visit?  no   Family History  Problem Relation Age of Onset  . Arthritis Mother   . Depression Mother   . Heart attack Father        1457   . Hypertension Father    Social History   Socioeconomic History  . Marital status: Widowed    Spouse name: Not on file  . Number of children: Not on file  . Years of education: Not on file  . Highest education level: Not on file  Occupational History  . Not on file  Social Needs  . Financial resource strain: Not on file  . Food insecurity:    Worry: Not on file    Inability: Not on file  .  Transportation needs:    Medical: Not on file    Non-medical: Not on file  Tobacco Use  . Smoking status: Never Smoker  . Smokeless tobacco: Current User    Types: Snuff  Substance and Sexual Activity  . Alcohol use: Yes    Alcohol/week: 1.0 standard drinks    Types: 1 Standard drinks or equivalent per week    Comment: occasional beer  . Drug use: No  . Sexual activity: Not on file  Lifestyle  . Physical activity:    Days per week: Not on file    Minutes per session: Not on file  . Stress: Not on file  Relationships  . Social connections:    Talks on phone: Not on file    Gets together: Not on file    Attends religious service: Not on file    Active member of club or organization: Not on file    Attends meetings of clubs or organizations: Not on file    Relationship status: Not on file  Other Topics Concern  . Not on file  Social History Narrative   Retired homemaker   Widow   Completed 7th grade   2 daughter   Shari HeritageSon and daughter. Daughter local, son in [place]   4 grandchildren   Enjoys puzzles, gardening.   Past Surgical History:  Procedure Laterality Date  . ABDOMINAL HYSTERECTOMY    .  HIP ARTHROPLASTY Right 01/26/2016   Procedure: ARTHROPLASTY BIPOLAR HIP (HEMIARTHROPLASTY);  Surgeon: Sheral Apley, MD;  [facility];  Service: Orthopedics;  Laterality: Right;  . IR ANGIO EXTRACRAN SEL COM CAROTID INNOMINATE UNI L MOD SED  04/05/2018  . IR CT HEAD LTD  04/05/2018  . IR PERCUTANEOUS ART THROMBECTOMY/INFUSION INTRACRANIAL INC DIAG ANGIO  04/05/2018  . KNEE ARTHROSCOPY Bilateral   . RADIOLOGY WITH ANESTHESIA N/A 04/04/2018   Procedure: RADIOLOGY WITH ANESTHESIA;  Surgeon: Radiologist, Medication, MD;  Location: [facility] OR;  Service: Radiology;  Laterality: N/A;  . REPLACEMENT TOTAL KNEE BILATERAL     1998 left , 1992 right   Past Medical History:  Diagnosis Date  . Arthritis   . Diabetes mellitus without complication (HCC)   . History of chicken pox   . Hyperlipidemia   .  Hypertension   . Thyroid disease    hypothyroid   BP 106/70   Ht 5\' 9"  (1.753 m)   Wt 140 lb (63.5 kg)   BMI 20.67 kg/m   Opioid Risk Score:   Fall Risk Score:  `1  Depression screen PHQ 2/9  Depression screen [facility] 2/9 11/29/2016 11/29/2016 11/11/2015 10/16/2014 10/13/2014  Decreased Interest 0 0 0 0 0  Down, Depressed, Hopeless 0 0 2 0 0  PHQ - 2 Score 0 0 2 0 0  Altered sleeping 0 - - - -  Tired, decreased energy 0 - - - -  Change in appetite 0 - - - -  Feeling bad or failure about yourself  0 - - - -  Trouble concentrating 0 - - - -  Moving slowly or fidgety/restless 0 - - - -  Suicidal thoughts 0 - - - -  PHQ-9 Score 0 - - - -  Difficult doing work/chores Not difficult at all - - - -     Review of Systems  Constitutional: Negative.   HENT: Negative.   Eyes: Negative.   Respiratory: Negative.   Cardiovascular: Negative.   Gastrointestinal: Negative.   Endocrine: Negative.   Genitourinary: Positive for difficulty urinating.  Musculoskeletal: Positive for arthralgias, gait problem and myalgias.  Skin: Negative.   Allergic/Immunologic: Negative.   Neurological: Positive for weakness.  Hematological: Negative.   Psychiatric/Behavioral: Negative.   All other systems reviewed and are negative.      Objective:   Physical Exam  Deferred      Assessment & Plan:  #1.  History of CVA requiring additional therapy, patient will be going to SNF for unknown duration.  According to her daughter it may be for short-term rehab or could be residential.  This will depend on patient's progress. I would like to see the patient back in office in approximately 4 weeks.  Right MCA infarct infarct 4/1//2020, CIR inpatient rehabilitation therapy was performed for 04/07/2018 until 05/05/2018

## 2018-06-04 NOTE — Telephone Encounter (Signed)
Erin calling back to check status. Please advise

## 2018-06-04 NOTE — Telephone Encounter (Signed)
Verbal orders given over the telephone.

## 2018-06-04 NOTE — Telephone Encounter (Signed)
Verbal order given for social worker evaluation for skill nursing facility.

## 2018-06-05 ENCOUNTER — Telehealth: Payer: Self-pay | Admitting: *Deleted

## 2018-06-05 MED ORDER — GABAPENTIN 400 MG PO CAPS: 400.0000 mg | ORAL_CAPSULE | Freq: Two times a day (BID) | ORAL | 0 refills | Status: AC

## 2018-06-05 NOTE — Telephone Encounter (Signed)
Form completed. Daughter informed. Form placed at front desk for pick up.

## 2018-06-05 NOTE — Telephone Encounter (Signed)
Copied from CRM [id number]. Topic: General - Other >> Jun 04, 2018  2:01 PM Dalphine Handing A wrote: [name] called on behalf of patient in regards to [facility] paperwork. She received paperwork but it didn't not have Sandford Craze or Dr. Abner Greenspan signature. Patient would like a callback in regards to her dropping the paperwork off today to obtain signature.

## 2018-06-05 NOTE — Telephone Encounter (Signed)
Pt's daughter states that she also needs last office note to submit with her FL2.

## 2018-06-06 DIAGNOSIS — I10 Essential (primary) hypertension: Secondary | ICD-10-CM | POA: Diagnosis not present

## 2018-06-06 DIAGNOSIS — I63511 Cerebral infarction due to unspecified occlusion or stenosis of right middle cerebral artery: Secondary | ICD-10-CM | POA: Diagnosis not present

## 2018-06-06 DIAGNOSIS — E1149 Type 2 diabetes mellitus with other diabetic neurological complication: Secondary | ICD-10-CM | POA: Diagnosis not present

## 2018-06-06 DIAGNOSIS — Z794 Long term (current) use of insulin: Secondary | ICD-10-CM | POA: Diagnosis not present

## 2018-06-06 DIAGNOSIS — G8114 Spastic hemiplegia affecting left nondominant side: Secondary | ICD-10-CM | POA: Diagnosis not present

## 2018-06-06 NOTE — Telephone Encounter (Signed)
Left message for daughter to call my cell with any additional questions.

## 2018-06-12 ENCOUNTER — Other Ambulatory Visit: Payer: Self-pay | Admitting: Family

## 2018-06-12 ENCOUNTER — Other Ambulatory Visit: Payer: Self-pay | Admitting: Internal Medicine

## 2018-06-20 ENCOUNTER — Other Ambulatory Visit: Payer: Self-pay | Admitting: *Deleted

## 2018-06-20 NOTE — Patient Outreach (Signed)
Member assessed for potential [facility] Care Management needs as a benefit of her [employer] Medicare insurance.  Member has been receiving rehab therapy at [facility].   Confirmed with [facility] RN that member has transitioned to long term care at the facility.  Writer to sign off. No identifiable [facility] Care Management needs at this time.  Marthenia Rolling, MSN-Ed, RN,BSN [place] Acute Care Coordinator [phone]

## 2018-06-28 DIAGNOSIS — G8929 Other chronic pain: Secondary | ICD-10-CM | POA: Diagnosis not present

## 2018-06-28 DIAGNOSIS — S43032S Inferior subluxation of left humerus, sequela: Secondary | ICD-10-CM | POA: Diagnosis not present

## 2018-06-28 DIAGNOSIS — M79605 Pain in left leg: Secondary | ICD-10-CM | POA: Diagnosis not present

## 2018-06-28 DIAGNOSIS — I69354 Hemiplegia and hemiparesis following cerebral infarction affecting left non-dominant side: Secondary | ICD-10-CM | POA: Diagnosis not present

## 2018-06-28 DIAGNOSIS — M25512 Pain in left shoulder: Secondary | ICD-10-CM | POA: Diagnosis not present

## 2018-06-28 DIAGNOSIS — M545 Low back pain: Secondary | ICD-10-CM | POA: Diagnosis not present

## 2018-06-28 DIAGNOSIS — R451 Restlessness and agitation: Secondary | ICD-10-CM | POA: Diagnosis not present

## 2018-07-02 DIAGNOSIS — R451 Restlessness and agitation: Secondary | ICD-10-CM | POA: Diagnosis not present

## 2018-07-06 DIAGNOSIS — R4182 Altered mental status, unspecified: Secondary | ICD-10-CM | POA: Diagnosis not present

## 2018-07-06 DIAGNOSIS — R451 Restlessness and agitation: Secondary | ICD-10-CM | POA: Diagnosis not present

## 2018-07-10 ENCOUNTER — Other Ambulatory Visit: Payer: Self-pay

## 2018-07-10 DIAGNOSIS — R451 Restlessness and agitation: Secondary | ICD-10-CM | POA: Diagnosis not present

## 2018-07-10 DIAGNOSIS — R4182 Altered mental status, unspecified: Secondary | ICD-10-CM | POA: Diagnosis not present

## 2018-07-10 DIAGNOSIS — N390 Urinary tract infection, site not specified: Secondary | ICD-10-CM | POA: Diagnosis not present

## 2018-07-10 NOTE — Patient Outreach (Signed)
Telephone outreach to patient to obtain mRs was successfully completed. mRs= 5. Spoke with daughter Neoma Laming to obtain score. Patient lives in a long-term care facility.

## 2018-07-11 NOTE — Telephone Encounter (Signed)
cc

## 2018-07-31 ENCOUNTER — Other Ambulatory Visit: Payer: Self-pay

## 2018-07-31 ENCOUNTER — Ambulatory Visit: Payer: Medicare Other | Admitting: Family

## 2018-07-31 ENCOUNTER — Telehealth: Payer: Self-pay

## 2018-07-31 NOTE — Telephone Encounter (Signed)
Copied from [place] [id number]. Topic: Quick Communication - See Telephone Encounter >> Jul 30, 2018  5:31 PM Loma Boston wrote: CRM for notification. See Telephone encounter for: 07/30/18. Pt appt is to be cancelled for tomorrow 7/28 due to her being in a long term care

## 2018-09-04 DIAGNOSIS — F419 Anxiety disorder, unspecified: Secondary | ICD-10-CM | POA: Diagnosis not present

## 2018-09-04 DIAGNOSIS — I10 Essential (primary) hypertension: Secondary | ICD-10-CM | POA: Diagnosis not present

## 2018-09-04 DIAGNOSIS — F0151 Vascular dementia with behavioral disturbance: Secondary | ICD-10-CM | POA: Diagnosis not present

## 2018-09-04 DIAGNOSIS — F339 Major depressive disorder, recurrent, unspecified: Secondary | ICD-10-CM | POA: Diagnosis not present

## 2018-09-18 DIAGNOSIS — I6930 Unspecified sequelae of cerebral infarction: Secondary | ICD-10-CM | POA: Diagnosis not present

## 2018-09-18 DIAGNOSIS — F329 Major depressive disorder, single episode, unspecified: Secondary | ICD-10-CM | POA: Diagnosis not present

## 2018-09-18 DIAGNOSIS — F419 Anxiety disorder, unspecified: Secondary | ICD-10-CM | POA: Diagnosis not present

## 2018-09-18 DIAGNOSIS — I129 Hypertensive chronic kidney disease with stage 1 through stage 4 chronic kidney disease, or unspecified chronic kidney disease: Secondary | ICD-10-CM | POA: Diagnosis not present

## 2018-09-18 DIAGNOSIS — E1122 Type 2 diabetes mellitus with diabetic chronic kidney disease: Secondary | ICD-10-CM | POA: Diagnosis not present

## 2018-09-18 DIAGNOSIS — Z794 Long term (current) use of insulin: Secondary | ICD-10-CM | POA: Diagnosis not present

## 2018-09-18 DIAGNOSIS — E038 Other specified hypothyroidism: Secondary | ICD-10-CM | POA: Diagnosis not present

## 2018-09-18 DIAGNOSIS — N189 Chronic kidney disease, unspecified: Secondary | ICD-10-CM | POA: Diagnosis not present

## 2018-09-18 DIAGNOSIS — F0151 Vascular dementia with behavioral disturbance: Secondary | ICD-10-CM | POA: Diagnosis not present

## 2018-09-18 DIAGNOSIS — F339 Major depressive disorder, recurrent, unspecified: Secondary | ICD-10-CM | POA: Diagnosis not present

## 2018-09-18 DIAGNOSIS — R52 Pain, unspecified: Secondary | ICD-10-CM | POA: Diagnosis not present

## 2018-09-24 DIAGNOSIS — N189 Chronic kidney disease, unspecified: Secondary | ICD-10-CM | POA: Diagnosis not present

## 2018-09-24 DIAGNOSIS — D72829 Elevated white blood cell count, unspecified: Secondary | ICD-10-CM | POA: Diagnosis not present

## 2018-09-24 DIAGNOSIS — R300 Dysuria: Secondary | ICD-10-CM | POA: Diagnosis not present

## 2018-09-24 DIAGNOSIS — R05 Cough: Secondary | ICD-10-CM | POA: Diagnosis not present

## 2018-10-02 DIAGNOSIS — F419 Anxiety disorder, unspecified: Secondary | ICD-10-CM | POA: Diagnosis not present

## 2018-10-02 DIAGNOSIS — F339 Major depressive disorder, recurrent, unspecified: Secondary | ICD-10-CM | POA: Diagnosis not present

## 2018-10-02 DIAGNOSIS — F0151 Vascular dementia with behavioral disturbance: Secondary | ICD-10-CM | POA: Diagnosis not present

## 2018-10-04 DEATH — deceased

## 2018-10-08 ENCOUNTER — Other Ambulatory Visit: Payer: Self-pay | Admitting: *Deleted

## 2018-10-08 NOTE — Patient Outreach (Signed)
[facility]) Care Management  10/08/2018  Gurnoor Sloop 14-Jan-1935 [id number]   Referral received from care management assistant as member triggered red on EMMI general discharge dashboard on Friday 10/2.  Per chart, noted that member was a long term resident at [facility].  Call placed to [facility] to confirm member is resident, no answer, message left.  Call then placed to member's daughter, [name].  She report that member passed away this morning.  Condolences expressed, offered resources, declines need at this time.  Will not open case.  Valente David, [place], MSN [place] [phone]

## 2018-11-04 DEATH — deceased

## 2020-06-10 IMAGING — XA IR ANGIO EXTRACRAN SELECT COM CAROTID INNOMINATE UNI*L* MOD SED
1 series · 10 of 24 positions shown · IV contrast (IODINE)
Comparison: CT angiogram of the head and neck of 04/04/2018.

INDICATION: New onset left-sided weakness with neglect. Occluded right internal
carotid artery at the bulb, and also the right middle cerebral
artery M1 segment distal to the anterior temporal branch.

EXAM:
1. EMERGENT LARGE VESSEL OCCLUSION THROMBOLYSIS (anterior
CIRCULATION)
TECHNIQUE: Following a full explanation of the procedure along with the
potential associated complications, an informed witnessed consent
was obtained from the patient's daughter. The risks of intracranial
hemorrhage of 10%, worsening neurological deficit, ventilator
dependency, death and inability to revascularize were all reviewed
in detail with the patient's patient's daughter.

[Series 300: dr. (person_name) · 10 of 135 slices shown]
[im 6/135]
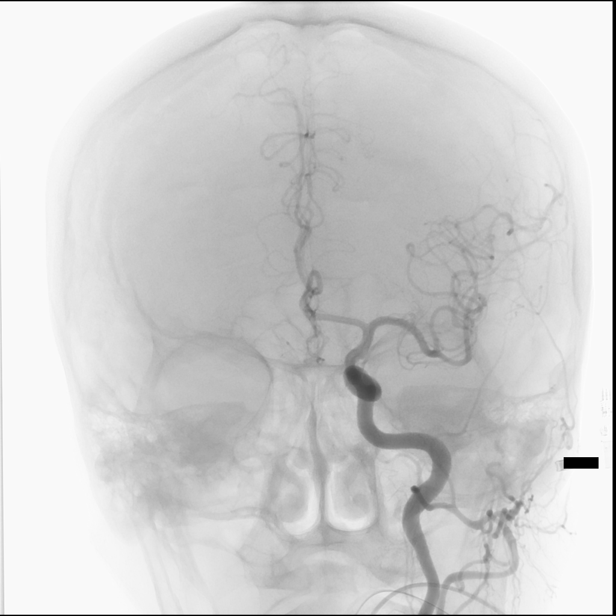
[im 18/135]
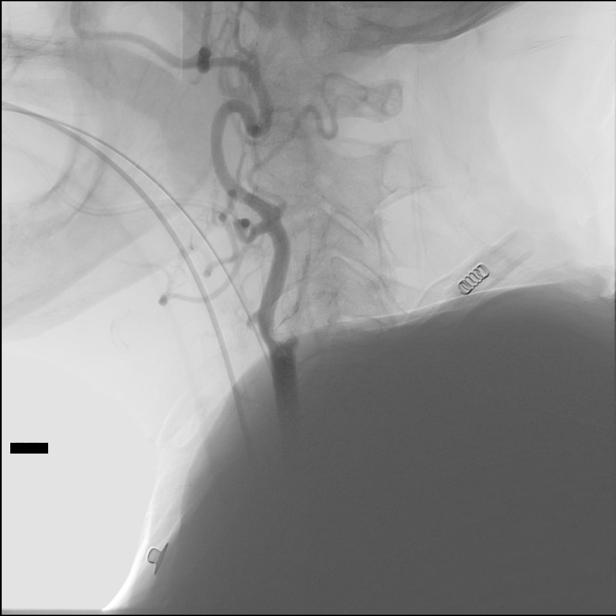
[im 35/135]
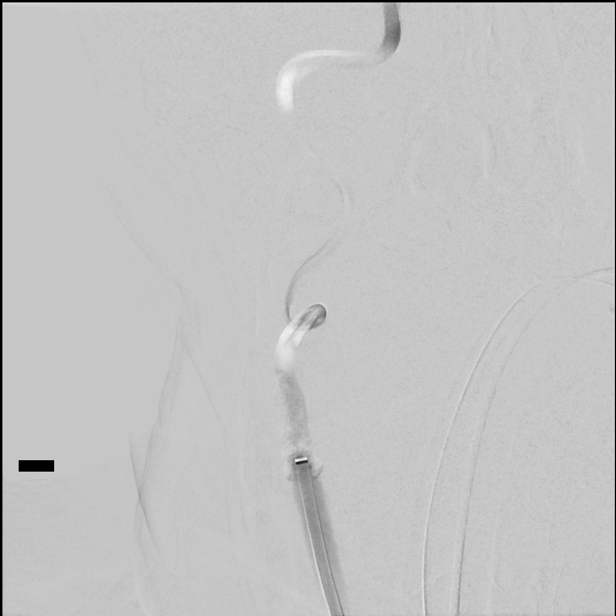
[im 47/135]
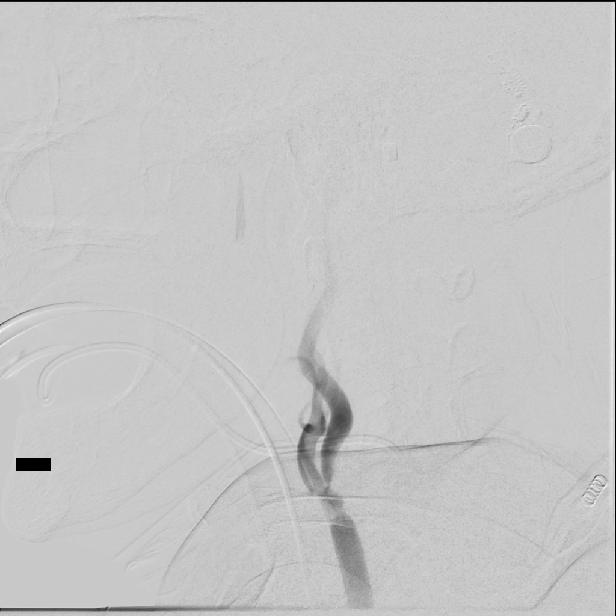
[im 59/135]
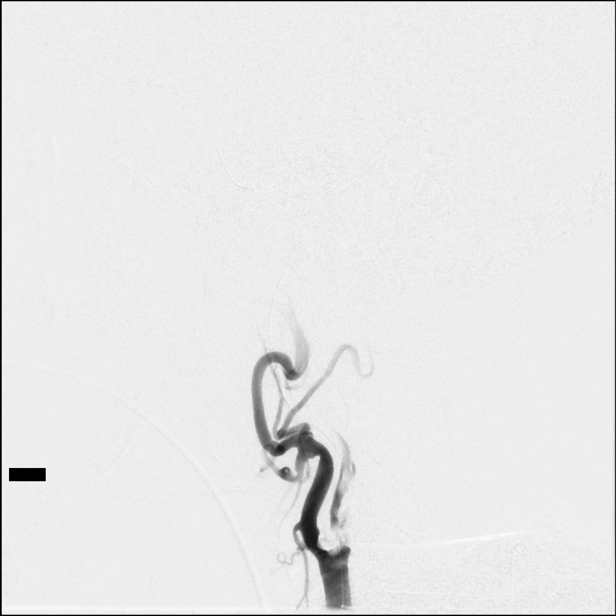
[im 76/135]
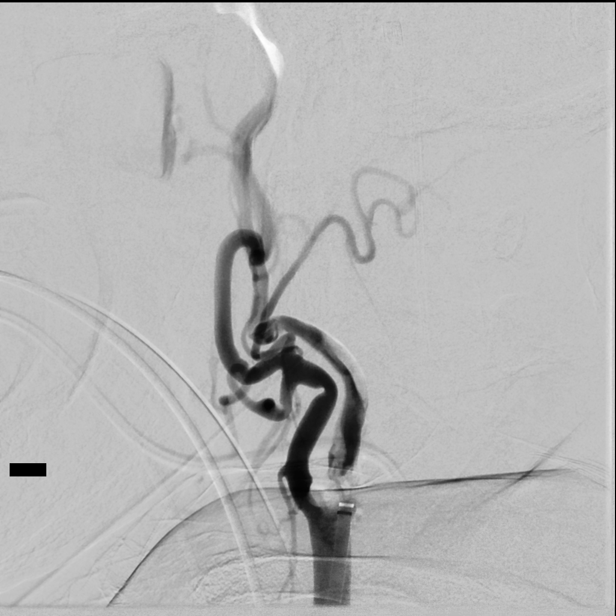
[im 88/135]
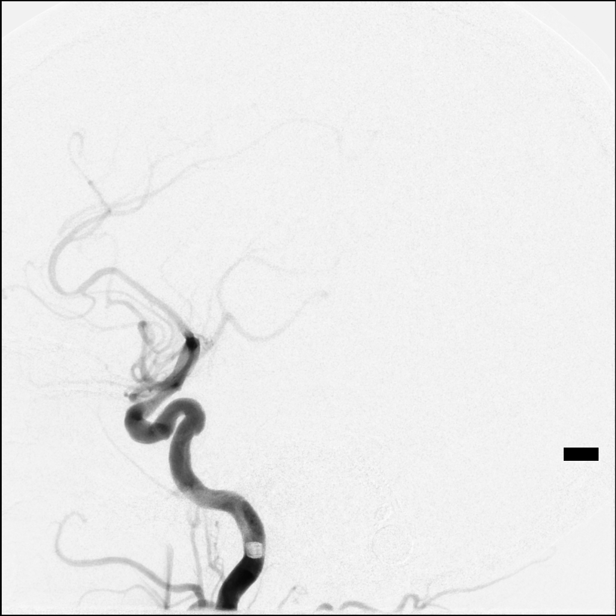
[im 105/135]
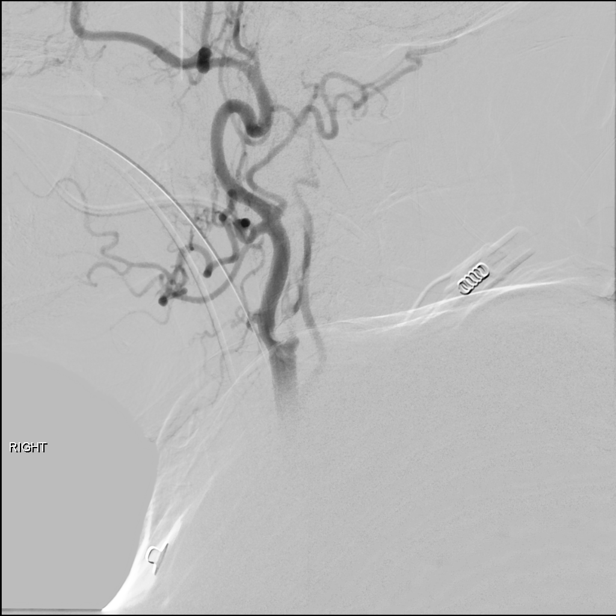
[im 117/135]
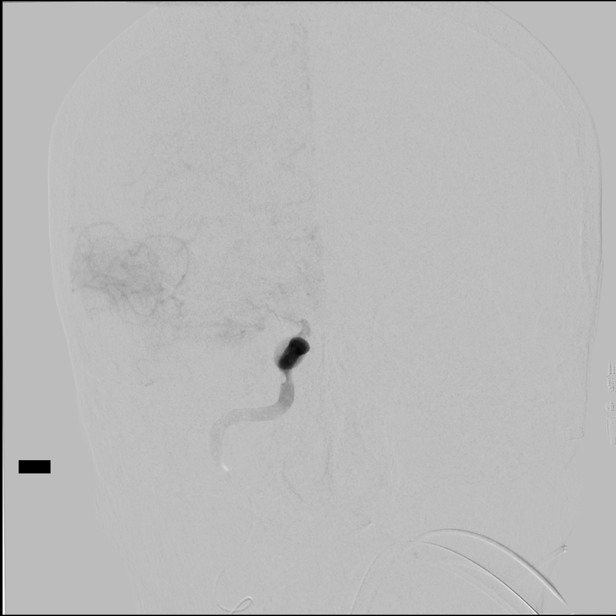
[im 129/135]
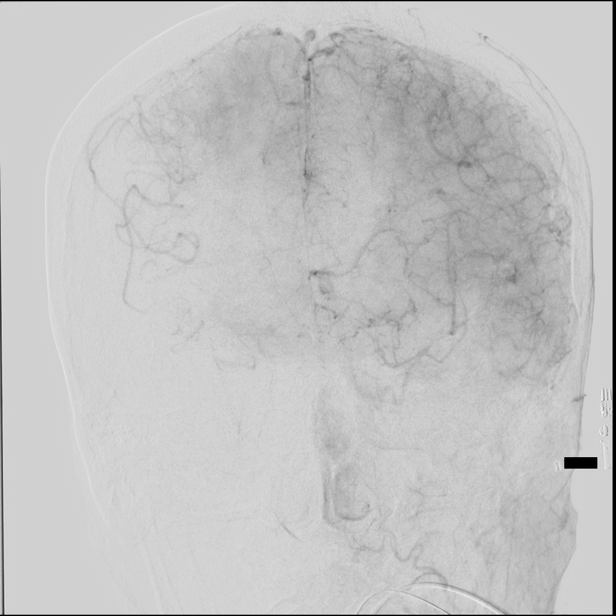

[10 of 24 positions shown; findings below may reference images not displayed]

MEDICATIONS:
2 g Ancef IV antibiotic was administered within 1 hour of the
procedure.

ANESTHESIA/SEDATION:
General anesthesia.

CONTRAST:  Isovue 300 approximately 130 mL.

FLUOROSCOPY TIME:  Fluoroscopy Time: 83 minutes 54 seconds (498
mGy).

COMPLICATIONS:
None immediate.
The patient was then put under general anesthesia by the [REDACTED] at [HOSPITAL].

The right groin was prepped and draped in the usual sterile fashion.
Thereafter using modified Seldinger technique, transfemoral access
into the right common femoral artery was obtained without
difficulty. Over a 0.035 inch guidewire a 5 French Pinnacle sheath
was inserted. Through this, and also over a 0.035 inch guidewire a 5
French JB 1 catheter was advanced to the aortic arch region and
selectively positioned in the innominate artery, the left common
carotid artery and the right common carotid artery.
FINDINGS: The innominate artery injection demonstrates significant tortuosity
at the origin of the innominate artery, and also of the aortic arch.

The right subclavian artery demonstrates mild stenosis proximally.
More distally, a hypoplastic right vertebral artery is seen
opacifying to the cranial skull base.

The right common carotid arteriogram demonstrates approximately 50%
stenosis of the right external carotid artery at its origin. Its
branches opacify normally.

The right internal carotid artery demonstrates near complete
occlusion of the right internal carotid artery at the bulb with
contrast in the proximal [DATE] of the right internal carotid artery
associated with a calcified plaque.

More distally, there is partial reconstitution of the right internal
carotid artery cavernous and supraclinoid segment from the right
external carotid artery branches into the right ophthalmic artery.

Delayed opacification of the right middle cerebral artery is seen.
The left common carotid arteriogram demonstrates approximately 50%
stenosis of the left common carotid artery proximal to the
bifurcation. The left external carotid artery demonstrates a 50%
stenosis at its origin. Its branches opacify normally, otherwise.

The left internal carotid artery at the bulb demonstrates severe
approximately 90-95% stenosis secondary to a circumferential plaque.
More distally, the vessel is seen to opacify to the cranial skull
base. The petrous, cavernous and supraclinoid segments demonstrate
wide patency.

The left middle cerebral artery and the left anterior cerebral
artery opacify into the capillary and venous phases.

Prompt cross filling via the anterior communicating artery of the
right anterior cerebral A2 segment and distally is seen from the
left internal carotid artery injection.

PROCEDURE:
The diagnostic JB 1 catheter in the right common carotid artery was
exchanged over a 0.035 inch 300 cm Rosen exchange guidewire for an 8
French 55 cm Brite tip neurovascular sheath. Good aspiration
obtained from the side port of the neurovascular sheath. This was
then connected to continuous heparinized saline infusion. Over the
exchange guidewire, an 8 French 85 cm FlowGate guide catheter which
had been prepped with 50% contrast and 50% heparinized saline
infusion was advanced and positioned just proximal to the right
common carotid bifurcation. The guidewire was removed good
aspiration obtained from the hub of the FlowGate guide catheter. A
gentle control arteriogram demonstrates safe position without
evidence of intraluminal filling defects or of vasospasm.

An 021 Trevo ProVue microcatheter was then advanced over a
inch Softip Synchro micro guidewire to the distal end of the
FlowGate guide catheter.

Using a torque device, access through the occluded right internal
carotid artery was obtained with the micro guidewire followed by the
microcatheter which was advanced to the first horizontal segment of
the petrous right ICA. The guidewire was removed. Good aspiration
obtained from the hub of the microcatheter. A gentle control
arteriogram performed through the microcatheter demonstrated patency
of the right internal carotid artery distal petrous, cavernous and
supraclinoid segments. Right middle cerebral artery opacification
was seen with occlusion distal to the origin of the anterior
temporal branch. Right anterior cerebral artery remained patent.

At this time, a 4 mm x 30 mm and 014 inch Viatrac balloon which had
been prepped with 50% contrast and 50% heparinized saline infusion
was advanced to the site of the occlusion using the rapid exchange
technique.

However, despite multiple attempts and manipulation of the 8 French
FlowGate guide catheter including expansion of the balloon and for
anchorage, access through the occluded right internal carotid artery
could not be obtained with the balloon.

This was then removed.

This was then followed by the advancement of the microcatheter to
the petrous right ICA. The exchange micro guidewire was removed.
Through the microcatheter, and over a 0.014 inch Softip Synchro
micro guidewire, and then an 016 inch Headliner micro guidewire,
access into the right middle cerebral artery M2 M3 region inferior
division could be obtained, however, advancement of the
microcatheter was met with persistent herniation secondary to the
displacement of the FlowGate guide catheter secondary to severe
tortuosity. Mechanical thrombolysis with the micro guidewire was
then performed in the hope to macerate the clot in the right middle
cerebral artery distal M1 segment.

A control arteriogram performed following this continued to
demonstrate occluded right internal carotid artery at the bulb.

Over the exchange guidewire, another attempt was made using a 3 mm x
15 mm [REDACTED] balloon guide catheter using the over the wire
technique. Again this was met with significant resistance to the
advancement of the angioplasty microcatheter.

Two angioplasties were performed in the proximal portion of the
occluded right internal carotid at the bulb. Again following these
angioplasties, there was improved blood noted into the right
internal carotid artery distally and intracranially.

Further attempts at advancing the angioplasty balloon guide catheter
through this again was met with unsuccessful advancement through the
hard plaque in the right internal carotid artery at the bulb.

It was, therefore, decided to stop. Throughout the procedure, the
patient's blood pressure and neurological status remained stable.
Patient was loaded with 180 mg of Brilinta, and 81 mg of aspirin
prior to the treatment via an orogastric tube.

The 8 French FlowGate guide catheter and the 8 French neurovascular
sheath were then retrieved into the abdominal aorta and exchanged
over a J-tip guidewire for an 8 French Pinnacle sheath. This in turn
was then removed with the successful placement and closure using a 7
French ExoSeal closure device. The right groin appeared soft without
evidence of a hematoma or bleeding. Distal pulses remained palpable
in the dorsalis pedis, and posterior tibial regions bilaterally at
the end of procedure unchanged.

CT of the brain obtained demonstrated no evidence of hemorrhage, or
mass effect or midline shift.

Patient was left intubated and transferred to the ICU for further
post treatment management.
IMPRESSION: Status post endovascular mechanical thrombolysis of the right middle
cerebral artery M1 segment with micro guidewire as described.

Balloon angioplasty x 2 of the symptomatic acute occlusion of the
right internal carotid artery at the bulb.

PLAN:
Follow-up point in clinic 4 weeks post discharge.
